# Patient Record
Sex: Male | Born: 1955
Health system: Southern US, Community
[De-identification: ages and names within clinical notes are randomized; demographics above are authoritative.]

## PROBLEM LIST (undated history)

## (undated) DIAGNOSIS — I10 Essential (primary) hypertension: Secondary | ICD-10-CM

## (undated) DIAGNOSIS — M1990 Unspecified osteoarthritis, unspecified site: Secondary | ICD-10-CM

## (undated) DIAGNOSIS — R579 Shock, unspecified: Secondary | ICD-10-CM

## (undated) DIAGNOSIS — I2510 Atherosclerotic heart disease of native coronary artery without angina pectoris: Secondary | ICD-10-CM

## (undated) DIAGNOSIS — G9340 Encephalopathy, unspecified: Secondary | ICD-10-CM

## (undated) DIAGNOSIS — J9602 Acute respiratory failure with hypercapnia: Secondary | ICD-10-CM

## (undated) DIAGNOSIS — F32A Depression, unspecified: Secondary | ICD-10-CM

## (undated) DIAGNOSIS — I428 Other cardiomyopathies: Secondary | ICD-10-CM

## (undated) DIAGNOSIS — F329 Major depressive disorder, single episode, unspecified: Secondary | ICD-10-CM

## (undated) DIAGNOSIS — E119 Type 2 diabetes mellitus without complications: Secondary | ICD-10-CM

## (undated) HISTORY — DX: Acute respiratory failure with hypercapnia: J96.02

## (undated) HISTORY — DX: Depression, unspecified: F32.A

## (undated) HISTORY — PX: BACK SURGERY: SHX140

## (undated) HISTORY — DX: Unspecified osteoarthritis, unspecified site: M19.90

## (undated) HISTORY — DX: Shock, unspecified: R57.9

## (undated) HISTORY — DX: Encephalopathy, unspecified: G93.40

## (undated) HISTORY — DX: Major depressive disorder, single episode, unspecified: F32.9

---

## 2005-03-20 ENCOUNTER — Emergency Department: Payer: Self-pay | Admitting: General Practice

## 2005-03-20 ENCOUNTER — Other Ambulatory Visit: Payer: Self-pay

## 2005-11-06 ENCOUNTER — Ambulatory Visit: Payer: Self-pay | Admitting: Internal Medicine

## 2005-12-06 ENCOUNTER — Ambulatory Visit: Payer: Self-pay | Admitting: Internal Medicine

## 2006-06-06 ENCOUNTER — Emergency Department: Payer: Self-pay | Admitting: Emergency Medicine

## 2006-06-08 ENCOUNTER — Emergency Department: Payer: Self-pay

## 2006-07-22 ENCOUNTER — Ambulatory Visit: Payer: Self-pay | Admitting: Cardiovascular Disease

## 2007-03-03 DIAGNOSIS — IMO0002 Reserved for concepts with insufficient information to code with codable children: Secondary | ICD-10-CM | POA: Insufficient documentation

## 2007-03-03 DIAGNOSIS — I10 Essential (primary) hypertension: Secondary | ICD-10-CM | POA: Insufficient documentation

## 2007-03-03 DIAGNOSIS — E7800 Pure hypercholesterolemia, unspecified: Secondary | ICD-10-CM | POA: Insufficient documentation

## 2007-03-03 DIAGNOSIS — E1165 Type 2 diabetes mellitus with hyperglycemia: Secondary | ICD-10-CM | POA: Insufficient documentation

## 2007-03-03 DIAGNOSIS — G894 Chronic pain syndrome: Secondary | ICD-10-CM | POA: Insufficient documentation

## 2007-05-09 ENCOUNTER — Other Ambulatory Visit: Payer: Self-pay

## 2007-05-09 ENCOUNTER — Emergency Department: Payer: Self-pay | Admitting: Emergency Medicine

## 2007-06-04 DIAGNOSIS — I4949 Other premature depolarization: Secondary | ICD-10-CM | POA: Insufficient documentation

## 2007-09-20 ENCOUNTER — Emergency Department: Payer: Self-pay | Admitting: Emergency Medicine

## 2008-03-27 DIAGNOSIS — Z8659 Personal history of other mental and behavioral disorders: Secondary | ICD-10-CM | POA: Insufficient documentation

## 2008-11-26 ENCOUNTER — Ambulatory Visit: Payer: Self-pay | Admitting: Family Medicine

## 2008-11-29 ENCOUNTER — Other Ambulatory Visit: Payer: Self-pay

## 2008-12-17 ENCOUNTER — Ambulatory Visit: Payer: Self-pay | Admitting: Family Medicine

## 2009-01-28 ENCOUNTER — Ambulatory Visit: Payer: Self-pay | Admitting: Family Medicine

## 2009-02-06 ENCOUNTER — Ambulatory Visit: Payer: Self-pay | Admitting: Family Medicine

## 2009-10-18 DIAGNOSIS — R61 Generalized hyperhidrosis: Secondary | ICD-10-CM | POA: Insufficient documentation

## 2010-10-02 ENCOUNTER — Ambulatory Visit: Payer: Self-pay | Admitting: Urology

## 2010-10-06 ENCOUNTER — Ambulatory Visit: Payer: Self-pay | Admitting: Urology

## 2011-06-04 ENCOUNTER — Ambulatory Visit: Payer: Self-pay | Admitting: Family Medicine

## 2011-09-14 ENCOUNTER — Ambulatory Visit: Payer: Self-pay | Admitting: Family Medicine

## 2011-09-23 ENCOUNTER — Other Ambulatory Visit: Payer: Self-pay | Admitting: Physical Medicine and Rehabilitation

## 2011-09-23 DIAGNOSIS — M542 Cervicalgia: Secondary | ICD-10-CM

## 2011-09-29 ENCOUNTER — Ambulatory Visit
Admission: RE | Admit: 2011-09-29 | Discharge: 2011-09-29 | Disposition: A | Discharge status: Home / Self Care | Payer: Medicare Other | Source: Ambulatory Visit | Attending: Physical Medicine and Rehabilitation | Admitting: Physical Medicine and Rehabilitation

## 2011-09-29 DIAGNOSIS — M542 Cervicalgia: Secondary | ICD-10-CM

## 2012-05-02 ENCOUNTER — Other Ambulatory Visit: Payer: Self-pay | Admitting: Family Medicine

## 2012-05-12 ENCOUNTER — Emergency Department (HOSPITAL_COMMUNITY): Payer: Medicare Other

## 2012-05-12 ENCOUNTER — Emergency Department (HOSPITAL_COMMUNITY)
Admission: EM | Admit: 2012-05-12 | Discharge: 2012-05-12 | Disposition: A | Discharge status: Home / Self Care | Payer: Medicare Other | Attending: Emergency Medicine | Admitting: Emergency Medicine

## 2012-05-12 ENCOUNTER — Encounter (HOSPITAL_COMMUNITY): Payer: Self-pay

## 2012-05-12 DIAGNOSIS — J3489 Other specified disorders of nose and nasal sinuses: Secondary | ICD-10-CM | POA: Insufficient documentation

## 2012-05-12 DIAGNOSIS — I10 Essential (primary) hypertension: Secondary | ICD-10-CM | POA: Insufficient documentation

## 2012-05-12 DIAGNOSIS — R062 Wheezing: Secondary | ICD-10-CM | POA: Insufficient documentation

## 2012-05-12 DIAGNOSIS — R509 Fever, unspecified: Secondary | ICD-10-CM | POA: Insufficient documentation

## 2012-05-12 DIAGNOSIS — Z79899 Other long term (current) drug therapy: Secondary | ICD-10-CM | POA: Insufficient documentation

## 2012-05-12 DIAGNOSIS — J9801 Acute bronchospasm: Secondary | ICD-10-CM | POA: Insufficient documentation

## 2012-05-12 DIAGNOSIS — E119 Type 2 diabetes mellitus without complications: Secondary | ICD-10-CM | POA: Insufficient documentation

## 2012-05-12 DIAGNOSIS — J988 Other specified respiratory disorders: Secondary | ICD-10-CM

## 2012-05-12 HISTORY — DX: Essential (primary) hypertension: I10

## 2012-05-12 HISTORY — DX: Type 2 diabetes mellitus without complications: E11.9

## 2012-05-12 MED ORDER — OSELTAMIVIR PHOSPHATE 75 MG PO CAPS: 75.0000 mg | ORAL_CAPSULE | Freq: Two times a day (BID) | ORAL | Status: DC

## 2012-05-12 MED ORDER — ALBUTEROL SULFATE (5 MG/ML) 0.5% IN NEBU
5.0000 mg | INHALATION_SOLUTION | Freq: Once | RESPIRATORY_TRACT | Status: AC
  Administered 2012-05-12: 5 mg via RESPIRATORY_TRACT
  Filled 2012-05-12: qty 1

## 2012-05-12 MED ORDER — AZITHROMYCIN 250 MG PO TABS: ORAL_TABLET | ORAL | Status: DC

## 2012-05-12 MED ORDER — PREDNISONE 50 MG PO TABS
60.0000 mg | ORAL_TABLET | Freq: Once | ORAL | Status: AC
  Administered 2012-05-12: 60 mg via ORAL
  Filled 2012-05-12: qty 1

## 2012-05-12 MED ORDER — SODIUM CHLORIDE 0.9 % IN NEBU
INHALATION_SOLUTION | RESPIRATORY_TRACT | Status: AC
  Administered 2012-05-12: 3 mL
  Filled 2012-05-12: qty 3

## 2012-05-12 MED ORDER — PREDNISONE 20 MG PO TABS: 60.0000 mg | ORAL_TABLET | Freq: Every day | ORAL | Status: DC

## 2012-05-12 MED ORDER — OSELTAMIVIR PHOSPHATE 75 MG PO CAPS
75.0000 mg | ORAL_CAPSULE | Freq: Once | ORAL | Status: AC
  Administered 2012-05-12: 75 mg via ORAL
  Filled 2012-05-12: qty 1

## 2012-05-12 MED ORDER — ALBUTEROL SULFATE HFA 108 (90 BASE) MCG/ACT IN AERS
2.0000 | INHALATION_SPRAY | RESPIRATORY_TRACT | Status: DC | PRN
  Administered 2012-05-12: 2 via RESPIRATORY_TRACT
  Filled 2012-05-12: qty 6.7

## 2012-05-12 MED ORDER — AZITHROMYCIN 250 MG PO TABS
500.0000 mg | ORAL_TABLET | Freq: Once | ORAL | Status: AC
  Administered 2012-05-12: 500 mg via ORAL
  Filled 2012-05-12: qty 2

## 2012-05-12 NOTE — ED Notes (Signed)
Pt c/o fever, cough, chills, and dizziness for 3 days.

## 2012-05-12 NOTE — ED Notes (Signed)
Discharge instructions reviewed with pt, questions answered. Pt verbalized understanding.  

## 2012-05-12 NOTE — ED Provider Notes (Signed)
History     CSN: [id number]  Arrival date & time 05/12/12  0249   First MD Initiated Contact with Patient 05/12/12 0301      Chief Complaint  Patient presents with  . Nasal Congestion  . Cough  . Fever    (Consider location/radiation/quality/duration/timing/severity/associated sxs/prior treatment) HPI History provided by patient. Cough with chills and fever x2 days. Patient became worried when he woke up feeling short of breath, wheezing with bodyaches and "drenched bed from sweating". No known sick contacts. Symptoms moderate in severity. No measured fevers. Has not had flu shot this season. No sore throat or difficulty swallowing. No history of tobacco use, asthma or wheezing in the past. Patient is diabetic and followed by [facility] family practice. He has not taken any for symptoms. No known aggravating or alleviating factors. Past Medical History  Diagnosis Date  . Hypertension   . Diabetes mellitus without complication     Past Surgical History  Procedure Date  . Back surgery     No family history on file.  History  Substance Use Topics  . Smoking status: Never Smoker   . Smokeless tobacco: Not on file  . Alcohol Use: No      Review of Systems  Constitutional: Positive for fever and chills.  HENT: Negative for neck pain and neck stiffness.   Eyes: Negative for pain.  Respiratory: Positive for cough, shortness of breath and wheezing.   Cardiovascular: Negative for chest pain.  Gastrointestinal: Negative for vomiting and abdominal pain.  Genitourinary: Negative for dysuria.  Musculoskeletal: Negative for back pain.  Skin: Negative for rash.  Neurological: Negative for syncope.  All other systems reviewed and are negative.    Allergies  Review of patient's allergies indicates no known allergies.  Home Medications   Current Outpatient Rx  Name  Route  Sig  Dispense  Refill  . AZITHROMYCIN 250 MG PO TABS      1 tablet by mouth daily   4 each   0    . METFORMIN HCL 1000 MG PO TABS      TAKE ONE TABLET BY MOUTH TWICE DAILY   20 tablet   0     Needs office visit   . OSELTAMIVIR PHOSPHATE 75 MG PO CAPS   Oral   Take 1 capsule (75 mg total) by mouth every 12 (twelve) hours.   10 capsule   0   . PREDNISONE 20 MG PO TABS   Oral   Take 3 tablets (60 mg total) by mouth daily.   15 tablet   0     BP 147/91  Pulse 95  Temp 98.3 F (36.8 C) (Oral)  Resp 18  Ht 6' (1.829 m)  Wt 310 lb (140.615 kg)  BMI 42.04 kg/m2  SpO2 96%  Physical Exam  Constitutional: He is oriented to person, place, and time. He appears well-developed and well-nourished.  HENT:  Head: Normocephalic and atraumatic.  Mouth/Throat: Oropharynx is clear and moist.  Eyes: EOM are normal. Pupils are equal, round, and reactive to light.  Neck: Neck supple.  Cardiovascular: Normal rate, regular rhythm and intact distal pulses.   Pulmonary/Chest: Effort normal.       Mildly decreased breath sounds with bilateral expiratory wheezes. No tachypnea or respiratory distress.  Abdominal: Soft. Bowel sounds are normal. He exhibits no distension. There is no tenderness.  Musculoskeletal: Normal range of motion. He exhibits no edema.  Lymphadenopathy:    He has no cervical adenopathy.  Neurological: He  is alert and oriented to person, place, and time.  Skin: Skin is warm and dry.    ED Course  Procedures (including critical care time)  Labs Reviewed - No data to display Dg Chest 2 View  05/12/2012  *RADIOLOGY REPORT*  Clinical Data: Congestion, cough, fever and right-sided chest pain.  CHEST - 2 VIEW  Comparison: None.  Findings: The lungs are well-aerated.  Minimal bibasilar airspace opacities may reflect atelectasis or possibly mild pneumonia. There is no evidence of pleural effusion or pneumothorax.  The heart is normal in size; the mediastinal contour is within normal limits.  No acute osseous abnormalities are seen.  IMPRESSION: Minimal bibasilar airspace  opacities may reflect atelectasis or possibly mild pneumonia.   Original Report Authenticated By: Tonia Ghent, M.D.      1. Wheezing-associated respiratory infection (WARI)    Albuterol and steroids provided. Chest x-ray obtained and reviewed as above. Recheck after breathing treatments still has wheezes, repeat albuterol provided with inhaler.  5:34 AM recheck feeling much better with improved lung sounds in stable for discharge home. Plan close followup with primary care physician and be evaluated sooner for any worsening condition. MDM   Fever cough and respiratory infection. Patient has flulike symptoms and chest x-ray concerning for possible early pneumonia. Room air pulse ox is 96% and adequate. Pulmonary exam improved with breathing treatments. Patient stable for outpatient follow-up. Vital signs and nursing notes reviewed and considered. Prescriptions provided with strict return precautions verbalized as understood.        Sunnie Nielsen, MD 05/12/12 [id number]

## 2012-08-25 ENCOUNTER — Other Ambulatory Visit: Payer: Self-pay | Admitting: Family Medicine

## 2012-09-01 ENCOUNTER — Ambulatory Visit: Payer: Self-pay | Admitting: Family Medicine

## 2013-03-09 ENCOUNTER — Ambulatory Visit: Payer: Self-pay | Admitting: Family Medicine

## 2013-03-27 ENCOUNTER — Ambulatory Visit: Payer: Self-pay | Admitting: Family Medicine

## 2013-03-27 DIAGNOSIS — R160 Hepatomegaly, not elsewhere classified: Secondary | ICD-10-CM | POA: Insufficient documentation

## 2013-04-13 ENCOUNTER — Ambulatory Visit: Payer: Self-pay | Admitting: Gastroenterology

## 2013-12-06 IMAGING — CR DG HEEL 2V*L*
1 series · 2 of 2 positions shown · non-contrast
Comparison: none

REASON FOR EXAM: left foot pain
COMMENTS:

PROCEDURE:     KDR - KDXR CALCANEOUS-HEEL LEFT  - June 04, 2011 [DATE]
RESULT:     AP and lateral views of the left calcaneus are submitted. I do
not see evidence of acute fracture nor dislocation. The overlying soft
tissues are grossly normal. No significant spurring is demonstrated.

[Series 1: lat · 0.17mm/px · 2 of 2 slices shown]
[im 1/2]
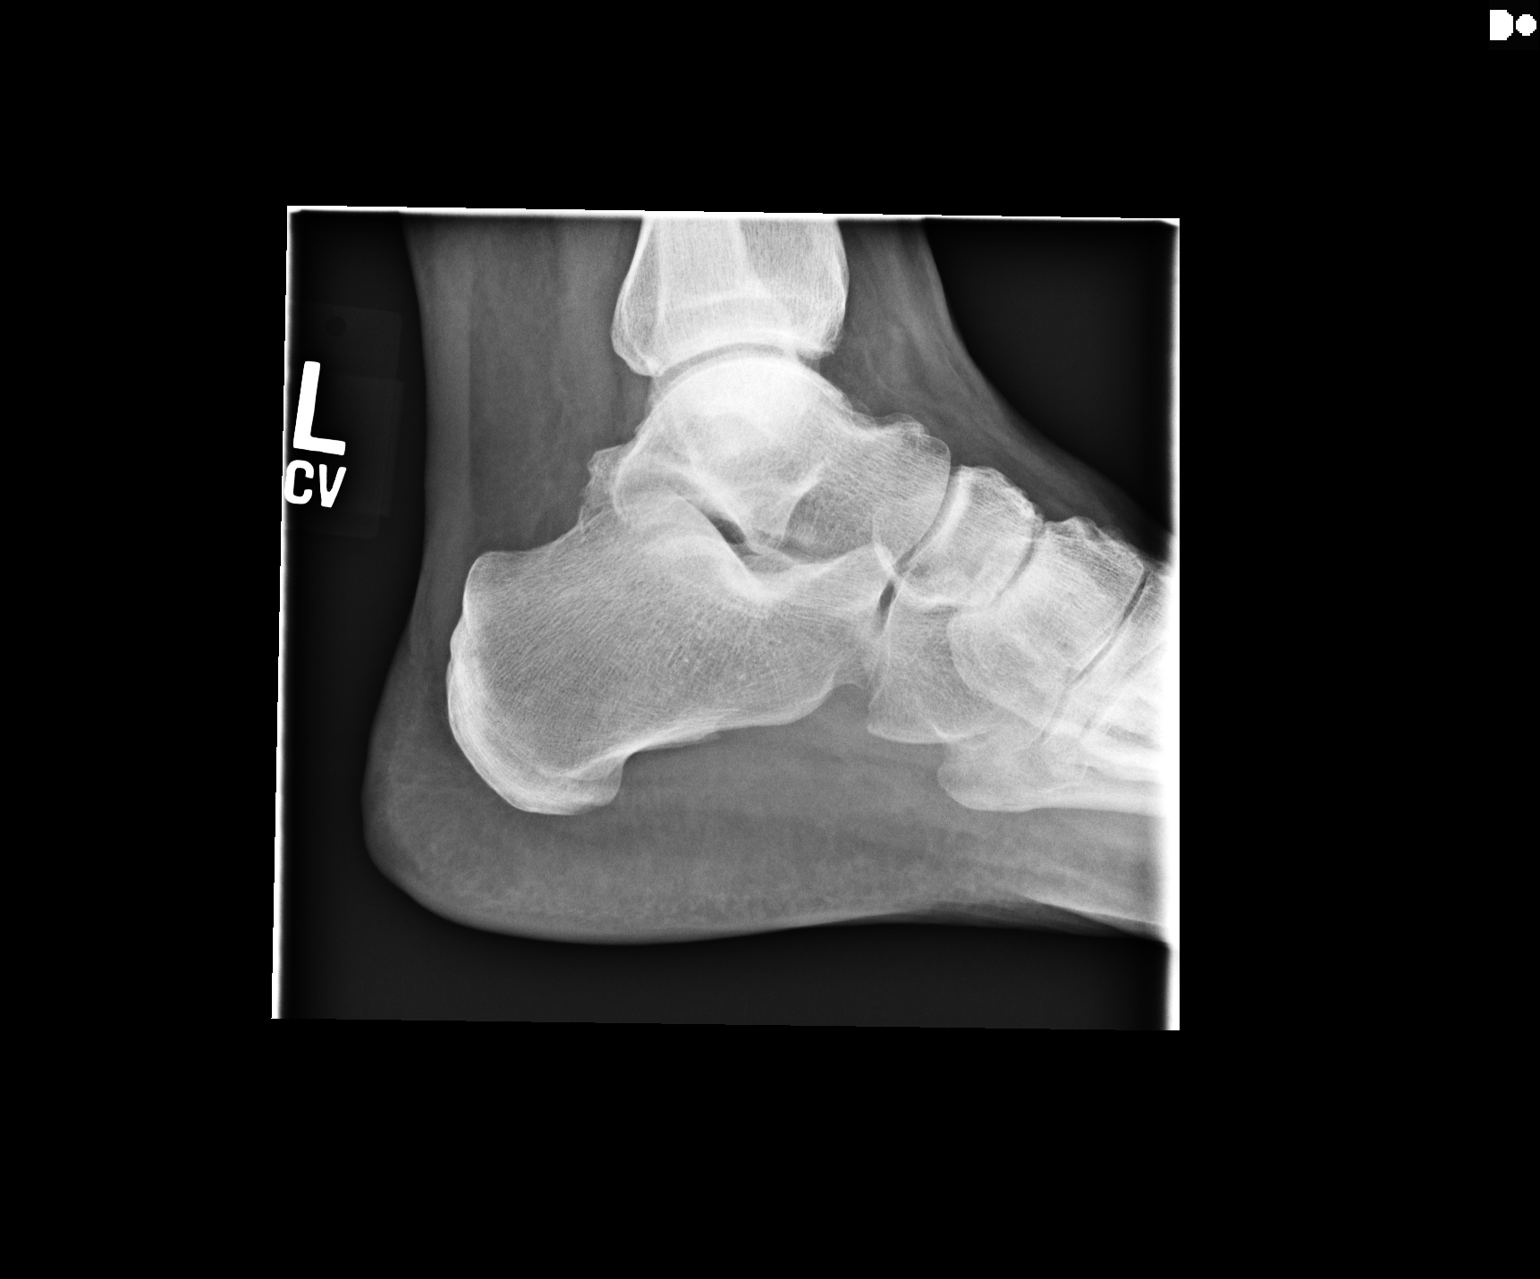
[im 2/2]
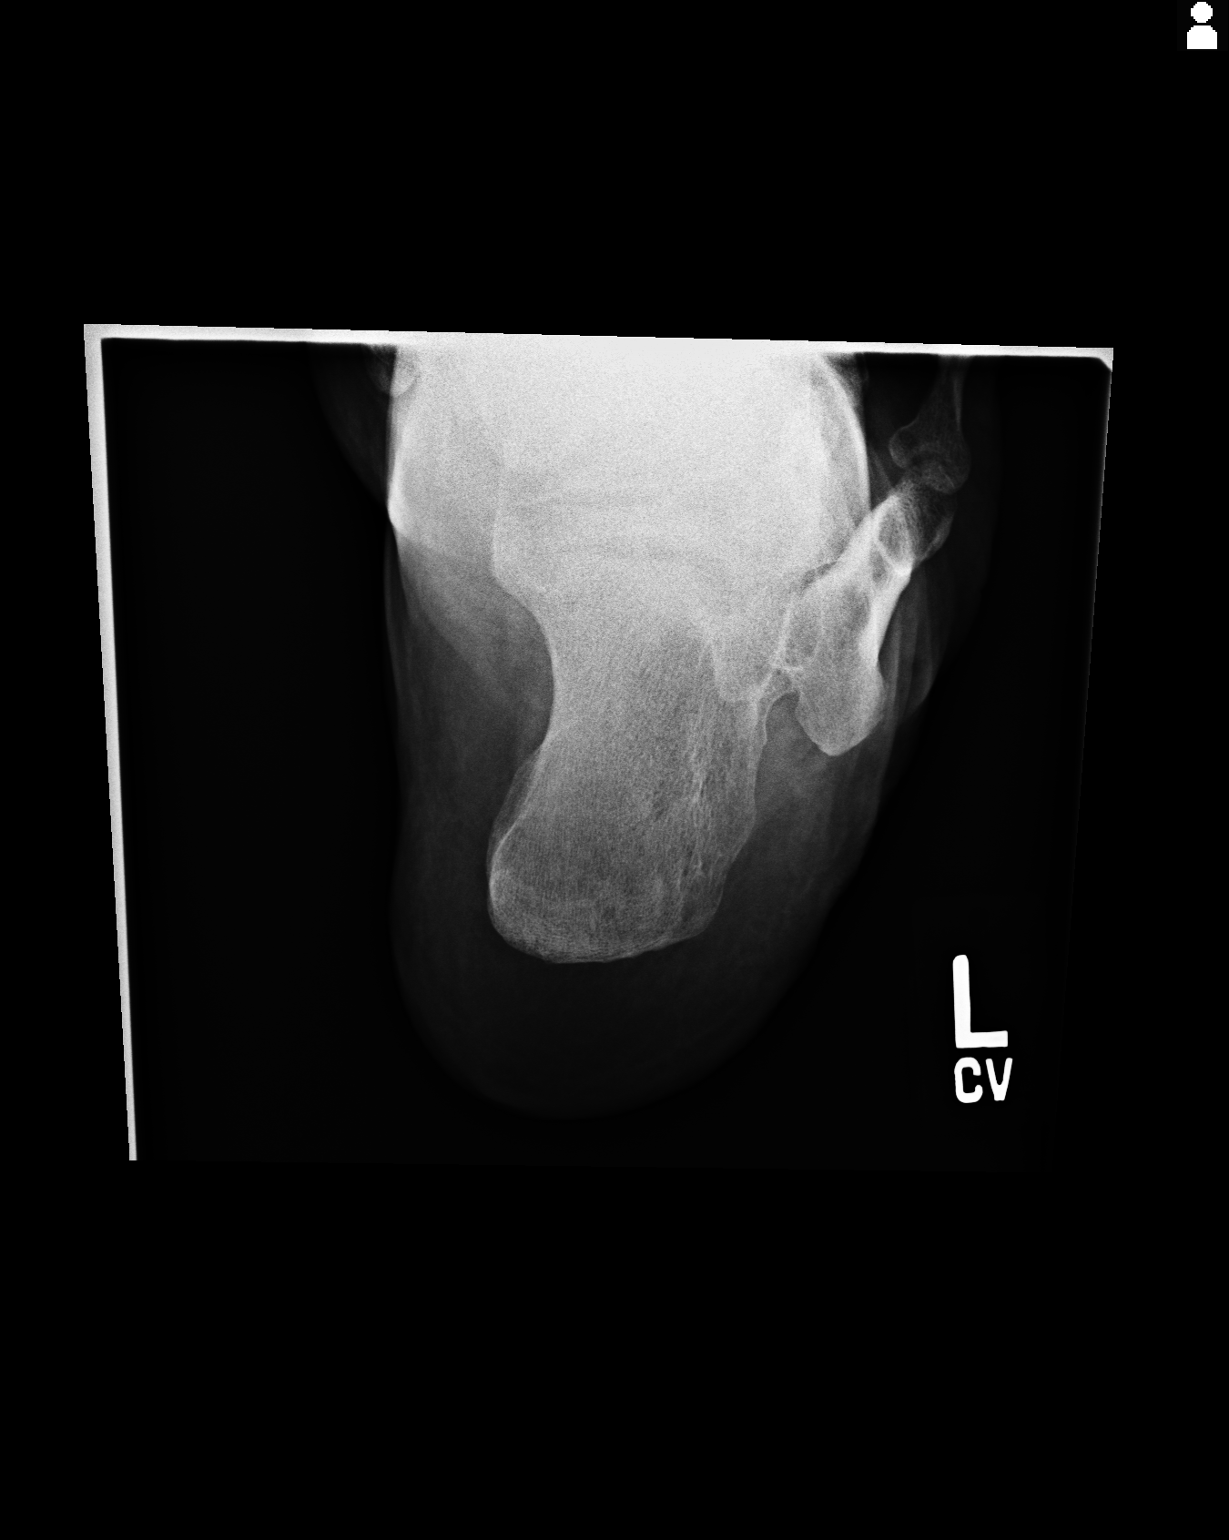

[2 of 2 positions shown; findings below may reference images not displayed]

IMPRESSION: I do not see acute abnormality of the calcaneus. If the
patient's symptoms persist and remain unexplained, followup nuclear bone
scanning or MRI may be useful.

## 2014-07-09 DIAGNOSIS — E291 Testicular hypofunction: Secondary | ICD-10-CM | POA: Diagnosis not present

## 2014-07-09 DIAGNOSIS — E1165 Type 2 diabetes mellitus with hyperglycemia: Secondary | ICD-10-CM | POA: Diagnosis not present

## 2014-07-09 DIAGNOSIS — I10 Essential (primary) hypertension: Secondary | ICD-10-CM | POA: Diagnosis not present

## 2014-08-01 DIAGNOSIS — I10 Essential (primary) hypertension: Secondary | ICD-10-CM | POA: Diagnosis not present

## 2014-08-01 DIAGNOSIS — E1165 Type 2 diabetes mellitus with hyperglycemia: Secondary | ICD-10-CM | POA: Diagnosis not present

## 2014-08-01 LAB — BASIC METABOLIC PANEL
BUN: 9 mg/dL (ref 4–21)
CREATININE: 1.1 mg/dL (ref <=1.3)
GLUCOSE: 222 mg/dL
POTASSIUM: 4.4 mmol/L (ref 3.4–5.3)
SODIUM: 136 mmol/L — AB (ref 137–147)

## 2014-10-04 DIAGNOSIS — I10 Essential (primary) hypertension: Secondary | ICD-10-CM | POA: Diagnosis not present

## 2014-10-04 DIAGNOSIS — E1165 Type 2 diabetes mellitus with hyperglycemia: Secondary | ICD-10-CM | POA: Diagnosis not present

## 2014-10-04 LAB — HEMOGLOBIN A1C: HEMOGLOBIN A1C: 8.7 % — AB (ref 4.0–6.0)

## 2014-11-15 ENCOUNTER — Encounter: Payer: Self-pay | Admitting: Family Medicine

## 2014-11-15 ENCOUNTER — Ambulatory Visit (INDEPENDENT_AMBULATORY_CARE_PROVIDER_SITE_OTHER): Payer: Medicare Other | Admitting: Family Medicine

## 2014-11-15 VITALS — BP 120/78 | HR 89 | Temp 98.7°F | Resp 16 | Ht 71.5 in | Wt 315.0 lb

## 2014-11-15 DIAGNOSIS — M546 Pain in thoracic spine: Secondary | ICD-10-CM | POA: Insufficient documentation

## 2014-11-15 DIAGNOSIS — R209 Unspecified disturbances of skin sensation: Secondary | ICD-10-CM | POA: Insufficient documentation

## 2014-11-15 DIAGNOSIS — L821 Other seborrheic keratosis: Secondary | ICD-10-CM | POA: Insufficient documentation

## 2014-11-15 DIAGNOSIS — E291 Testicular hypofunction: Secondary | ICD-10-CM | POA: Insufficient documentation

## 2014-11-15 DIAGNOSIS — R351 Nocturia: Secondary | ICD-10-CM | POA: Insufficient documentation

## 2014-11-15 DIAGNOSIS — M545 Low back pain, unspecified: Secondary | ICD-10-CM | POA: Insufficient documentation

## 2014-11-15 DIAGNOSIS — S90122A Contusion of left lesser toe(s) without damage to nail, initial encounter: Secondary | ICD-10-CM

## 2014-11-15 DIAGNOSIS — E349 Endocrine disorder, unspecified: Secondary | ICD-10-CM | POA: Insufficient documentation

## 2014-11-15 DIAGNOSIS — M542 Cervicalgia: Secondary | ICD-10-CM | POA: Insufficient documentation

## 2014-11-15 DIAGNOSIS — G8929 Other chronic pain: Secondary | ICD-10-CM | POA: Insufficient documentation

## 2014-11-15 DIAGNOSIS — M961 Postlaminectomy syndrome, not elsewhere classified: Secondary | ICD-10-CM | POA: Insufficient documentation

## 2014-11-15 DIAGNOSIS — R0609 Other forms of dyspnea: Secondary | ICD-10-CM | POA: Insufficient documentation

## 2014-11-15 NOTE — Progress Notes (Signed)
   Patient: Taylor [name] Male    DOB: 12-17-1955   59 y.o.   MRN: [id number] Visit Date: 11/15/2014  Today's Provider: Mila Merry, MD   No chief complaint on file.  Subjective:    HPI Patient states his daughter was dancing around and stepped down hard on the outside of his left foot several days ago. He states that a few days later tops of his 2nd and third toes turned blue and discolored. Is a little sore on the side of his foot at site of original injury, but no pain at site of toe discoloration.    Previous Medications   AZITHROMYCIN (ZITHROMAX) 250 MG TABLET    1 tablet by mouth daily   METFORMIN (GLUCOPHAGE) 1000 MG TABLET    TAKE ONE TABLET BY MOUTH TWICE DAILY   OSELTAMIVIR (TAMIFLU) 75 MG CAPSULE    Take 1 capsule (75 mg total) by mouth every 12 (twelve) hours.   PREDNISONE (DELTASONE) 20 MG TABLET    Take 3 tablets (60 mg total) by mouth daily.    Review of Systems  Constitutional: Positive for fatigue. Negative for fever.  Cardiovascular: Negative for chest pain, palpitations and leg swelling.  Gastrointestinal: Negative for abdominal pain.  Neurological: Negative for headaches.    History  Substance Use Topics  . Smoking status: Never Smoker   . Smokeless tobacco: Not on file  . Alcohol Use: No   Objective:   There were no vitals taken for this visit.  Physical Exam  Mild tenderness right lateral foot, no swelling. No discoloration at site. Some purplish discoloration with no tenderness of other abnormalities of dorsal aspect of right 2nd and thirds toes.      Assessment & Plan:     1. Contusion, toes, left, initial encounter Counseled that discoloration is due to venous drainage from site of injury and should heal completely over the next few weeks. Call otherwise.     Follow up: No Follow-up on file.

## 2014-12-05 ENCOUNTER — Other Ambulatory Visit: Payer: Self-pay | Admitting: Family Medicine

## 2014-12-12 ENCOUNTER — Telehealth: Payer: Self-pay | Admitting: Family Medicine

## 2014-12-12 DIAGNOSIS — E1165 Type 2 diabetes mellitus with hyperglycemia: Secondary | ICD-10-CM

## 2014-12-12 DIAGNOSIS — IMO0002 Reserved for concepts with insufficient information to code with codable children: Secondary | ICD-10-CM

## 2014-12-12 MED ORDER — CANAGLIFLOZIN 300 MG PO TABS: ORAL_TABLET | ORAL | Status: DC

## 2014-12-12 NOTE — Telephone Encounter (Signed)
Pt contacted office for refill request on the following medications: canagliflozin (INVOKANA) 300 MG TABS tablet  Pt stated that he comes to the office to and picks this up. I asked if it was samples and pt stated he guesses it is. Pt stated he took the last dose yesterday. Thanks TNP

## 2014-12-12 NOTE — Telephone Encounter (Signed)
Samples left up front for pick up. Patient advised.  

## 2014-12-12 NOTE — Telephone Encounter (Signed)
He can have 4 bottles of samples of Invokana 300. Thanks.

## 2014-12-31 ENCOUNTER — Other Ambulatory Visit: Payer: Self-pay | Admitting: Family Medicine

## 2014-12-31 NOTE — Telephone Encounter (Signed)
LMTCB

## 2014-12-31 NOTE — Telephone Encounter (Signed)
Pt contacted office for refill request on the following medications: canagliflozin (INVOKANA) 300 MG TABS tablet Pt would like to pick up samples. Pt also stated he had a message on his machine on 12/25/14 from our office but couldn't understand the message. I didn't find anything in pt's charts. Thanks TNP

## 2014-12-31 NOTE — Telephone Encounter (Signed)
He can have six samples bottles of Invokana 300. Thanks.

## 2014-12-31 NOTE — Telephone Encounter (Signed)
Ok to give Invokana samples?

## 2015-01-01 NOTE — Telephone Encounter (Signed)
Pt informed

## 2015-01-07 ENCOUNTER — Other Ambulatory Visit: Payer: Self-pay | Admitting: Family Medicine

## 2015-01-07 MED ORDER — LISINOPRIL 10 MG PO TABS: 10.0000 mg | ORAL_TABLET | Freq: Every day | ORAL | Status: DC

## 2015-01-07 NOTE — Telephone Encounter (Signed)
Refill request for lisinopril 10 mg Last filled by MD on- 11/05/2014 #30 x1 Last Appt: 11/15/2014 Next Appt: 01/15/2015 Please advise refill?

## 2015-01-07 NOTE — Telephone Encounter (Signed)
lisinopril (PRINIVIL,ZESTRIL) 10 MG tablet Taking 11/05/14 -- they needs new RX order.   Thanks Barth Kirks

## 2015-01-15 ENCOUNTER — Encounter: Payer: Self-pay | Admitting: Family Medicine

## 2015-01-24 ENCOUNTER — Ambulatory Visit (INDEPENDENT_AMBULATORY_CARE_PROVIDER_SITE_OTHER): Payer: Medicare Other | Admitting: Family Medicine

## 2015-01-24 ENCOUNTER — Encounter: Payer: Self-pay | Admitting: Family Medicine

## 2015-01-24 VITALS — BP 118/80 | HR 65 | Temp 98.7°F | Resp 16 | Ht 71.5 in | Wt 317.0 lb

## 2015-01-24 DIAGNOSIS — Z0000 Encounter for general adult medical examination without abnormal findings: Secondary | ICD-10-CM

## 2015-01-24 DIAGNOSIS — I10 Essential (primary) hypertension: Secondary | ICD-10-CM

## 2015-01-24 DIAGNOSIS — E291 Testicular hypofunction: Secondary | ICD-10-CM | POA: Diagnosis not present

## 2015-01-24 DIAGNOSIS — E1165 Type 2 diabetes mellitus with hyperglycemia: Secondary | ICD-10-CM

## 2015-01-24 DIAGNOSIS — E7800 Pure hypercholesterolemia, unspecified: Secondary | ICD-10-CM

## 2015-01-24 DIAGNOSIS — IMO0002 Reserved for concepts with insufficient information to code with codable children: Secondary | ICD-10-CM

## 2015-01-24 NOTE — Progress Notes (Signed)
Patient: Taylor [name], Male    DOB: 09/14/1955, 59 y.o.   MRN: [id number] Visit Date: 01/24/2015  Today's Provider: Mila Merry, MD   Chief Complaint  Patient presents with  . Medicare Wellness  . Diabetes  . Hypertension  . Hyperlipidemia   Subjective:    Annual wellness visit Taylor [name] is a 59 y.o. male. He feels fairly well. He reports exercising yes . He reports he is sleeping poorly.  -----------------------------------------------------------  Follow-up for chronic back pain from 10/04/2014; started 6 day taper of prednisone 10 mg. Follow-up for chronic pain syndrome; no changes were made.   Diabetes Mellitus Type II, Follow-up:   Lab Results  Component Value Date   HGBA1C 8.7* 10/04/2014   Last seen for diabetes 4 months ago.  Management changes included none. He reports good compliance with treatment. He is not having side effects. none Current symptoms include none and have been unchanged. Home blood sugar records: fasting range: 100-200  Episodes of hypoglycemia? no    Most Recent Eye Exam: Within the last year.  Weight trend: stable Prior visit with dietician: no Current diet: well balanced Current exercise: none  ------------------------------------------------------------------------   Hypertension, follow-up:  BP Readings from Last 3 Encounters:  01/24/15 118/80  11/15/14 120/78  05/12/12 147/91    He was last seen for hypertension 4 months ago.  BP at that visit was 140/96. Management changes since that visit include none .He reports good compliance with treatment. He is not having side effects. none  He is exercising. He is adherent to low salt diet.   Outside blood pressures are none. He is experiencing none.  Patient denies none.   Cardiovascular risk factors include hypertension.  Use of agents associated with hypertension: NSAIDS.    ------------------------------------------------------------------------    Lipid/Cholesterol, Follow-up:   Last seen for this 9 months ago.  Management changes since that visit include none.  Last Lipid Panel: No results found for: CHOL, TRIG, HDL, CHOLHDL, VLDL, LDLCALC, LDLDIRECT  He reports good compliance with treatment. He is not having side effects. none  Wt Readings from Last 3 Encounters:  01/24/15 317 lb (143.79 kg)  11/15/14 315 lb (142.883 kg)  05/12/12 310 lb (140.615 kg)    ------------------------------------------------------------------------    Review of Systems  Constitutional: Positive for diaphoresis and fatigue.  Eyes: Negative.   Respiratory: Negative.   Gastrointestinal: Positive for constipation and abdominal distention.  Endocrine: Positive for heat intolerance and polydipsia.  Genitourinary: Negative.   Musculoskeletal: Positive for myalgias and arthralgias.  Skin: Negative.   Allergic/Immunologic: Negative.   Neurological: Positive for weakness and numbness. Negative for facial asymmetry, light-headedness and headaches.  Hematological: Negative.   Psychiatric/Behavioral: Positive for sleep disturbance and decreased concentration.    Social History   Social History  . Marital Status: Single    Spouse Name: N/A  . Number of Children: N/A  . Years of Education: N/A   Occupational History  . Not on file.   Social History Main Topics  . Smoking status: Never Smoker   . Smokeless tobacco: Not on file  . Alcohol Use: No  . Drug Use: Not on file  . Sexual Activity: Not on file   Other Topics Concern  . Not on file   Social History Narrative    Patient Active Problem List   Diagnosis Date Noted  . Back ache 11/15/2014  . Chronic low back pain 11/15/2014  . Dyspnea on exertion 11/15/2014  .  Erectile dysfunction 11/15/2014  . Hypogonadism, male 11/15/2014  . Cervical pain 11/15/2014  . Nocturia 11/15/2014  . Disturbance of  skin sensation 11/15/2014  . RUQ pain 11/15/2014  . Basal cell papilloma 11/15/2014  . Thoracic back pain 11/15/2014  . Hepatomegaly 03/27/2013  . Generalized hyperhidrosis 10/18/2009  . Depressive disorder 03/27/2008  . Premature beats 06/04/2007  . Chronic pain syndrome 03/03/2007  . Diabetes mellitus type 2, uncontrolled 03/03/2007  . Hypercholesteremia 03/03/2007  . Hypertension, essential, benign 03/03/2007  . Obesity 03/03/2007    Past Surgical History  Procedure Laterality Date  . Back surgery      His family history includes Diabetes in his mother and sister.    Previous Medications   ASPIRIN 81 MG TABLET    Take by mouth.   CANAGLIFLOZIN (INVOKANA) 300 MG TABS TABLET    Take 1 tablet daily   GLIPIZIDE (GLUCOTROL XL) 10 MG 24 HR TABLET    TAKE ONE TABLET BY MOUTH ONCE DAILY   HYDROMORPHONE (DILAUDID) 4 MG TABLET    Take 4 mg by mouth every 6 (six) hours as needed.    IBUPROFEN-FAMOTIDINE 800-26.6 MG TABS    Take 1 tablet by mouth 3 (three) times daily as needed.   LISINOPRIL (PRINIVIL,ZESTRIL) 10 MG TABLET    Take 1 tablet (10 mg total) by mouth daily.   METFORMIN (GLUCOPHAGE) 1000 MG TABLET    TAKE ONE TABLET BY MOUTH TWICE DAILY   OXYCODONE HCL    Take 40 mg by mouth every 4 (four) hours as needed.    PREGABALIN (LYRICA) 75 MG CAPSULE    Take by mouth.   SIMVASTATIN (ZOCOR) 40 MG TABLET    Take by mouth.   TESTOSTERONE IM       ZOLPIDEM (AMBIEN CR) 12.5 MG CR TABLET    AMBIEN CR, 12.5MG  (Oral Tablet Extended Release)  1 PO QHS for 0 days  Quantity: 30.00;  Refills: 0   Ordered :15-Jul-2010  Nilda Simmer MD;  Started 03-Mar-2007 Active    Patient Care Team: Malva Limes, MD as PCP - General (Family Medicine) Raj Janus, MD as Consulting Physician (Endocrinology)     Objective:   Vitals: BP 118/80 mmHg  Pulse 65  Temp(Src) 98.7 F (37.1 C) (Oral)  Resp 16  Ht 5' 11.5" (1.816 m)  Wt 317 lb (143.79 kg)  BMI 43.60 kg/m2  SpO2 97%  Physical  Exam  General Appearance:    Alert, cooperative, no distress, appears stated age, obese  Head:    Normocephalic, without obvious abnormality, atraumatic  Eyes:    PERRL, conjunctiva/corneas clear, EOM's intact, fundi    benign, both eyes       Ears:    Normal TM's and external ear canals, both ears  Nose:   Nares normal, septum midline, mucosa normal, no drainage   or sinus tenderness  Throat:   Lips, mucosa, and tongue normal; teeth and gums normal  Neck:   Supple, symmetrical, trachea midline, no adenopathy;       thyroid:  No enlargement/tenderness/nodules; no carotid   bruit or JVD  Back:     Symmetric, no curvature, ROM normal, no CVA tenderness  Lungs:     Clear to auscultation bilaterally, respirations unlabored  Chest wall:    No tenderness or deformity  Heart:    Regular rate and rhythm, S1 and S2 normal, no murmur, rub   or gallop  Abdomen:     Soft, non-tender, bowel sounds active all  four quadrants,    no masses, no organomegaly  Genitalia:    deferred  Rectal:    deferred  Extremities:   Extremities normal, atraumatic, no cyanosis or edema  Pulses:   2+ and symmetric all extremities  Skin:   Skin color, texture, turgor normal, no rashes or lesions  Lymph nodes:   Cervical, supraclavicular, and axillary nodes normal  Neurologic:   CNII-XII intact. Normal strength, sensation and reflexes      throughout    Activities of Daily Living No flowsheet data found.  Fall Risk Assessment No flowsheet data found.   Depression Screen PHQ 2/9 Scores 01/24/2015  PHQ - 2 Score 4  PHQ- 9 Score 11        Assessment & Plan:     Annual Wellness Visit  Reviewed patient's Family Medical History Reviewed and updated list of patient's medical providers Assessment of cognitive impairment was done Assessed patient's functional ability Established a written schedule for health screening services Health Risk Assessent Completed and Reviewed  Exercise Activities and Dietary  recommendations Goals    None      Immunization History  Administered Date(s) Administered  . Pneumococcal Polysaccharide-23 11/27/2013  . Tdap 11/25/2012    Health Maintenance  Topic Date Due  . Hepatitis C Screening  04-08-1956  . OPHTHALMOLOGY EXAM  03/28/1967  . COLONOSCOPY  03/28/2007  . INFLUENZA VACCINE  01/07/2015  . HEMOGLOBIN A1C  04/05/2015  . PNEUMOCOCCAL POLYSACCHARIDE VACCINE (2) 11/28/2018  . TETANUS/TDAP  11/26/2022      Discussed health benefits of physical activity, and encouraged him to engage in regular exercise appropriate for his age and condition.    ------------------------------------------------------------------------------------------------------------  1. Annual physical exam Generally doing well. Consider cologuard since Colonoscopy was not able to be completed when attempted in 2014.  - PSA  2. Diabetes mellitus type 2, uncontrolled Doing well current medications.  - Hemoglobin A1c - Renal function panel  3. Hypercholesteremia He is tolerating simvastatin well with no adverse effects.   - Lipid panel - Hepatic function panel - TSH  4. Hypertension, essential, benign Stable. Continue current medications.   - EKG 12-Lead  5. Hypogonadism, male Doing well on current testosterone replacement, managed by Dr. Tedd Sias.

## 2015-01-25 LAB — HEPATIC FUNCTION PANEL
ALK PHOS: 84 IU/L (ref 39–117)
ALT: 31 IU/L (ref 0–44)
AST: 26 IU/L (ref 0–40)
BILIRUBIN, DIRECT: 0.11 mg/dL (ref 0.00–0.40)
Bilirubin Total: 0.3 mg/dL (ref 0.0–1.2)
TOTAL PROTEIN: 7.4 g/dL (ref 6.0–8.5)

## 2015-01-25 LAB — RENAL FUNCTION PANEL
ALBUMIN: 4.4 g/dL (ref 3.5–5.5)
BUN/Creatinine Ratio: 12 (ref 9–20)
BUN: 13 mg/dL (ref 6–24)
CALCIUM: 9.6 mg/dL (ref 8.7–10.2)
CHLORIDE: 99 mmol/L (ref 97–108)
CO2: 25 mmol/L (ref 18–29)
Creatinine, Ser: 1.11 mg/dL (ref 0.76–1.27)
GFR calc Af Amer: 85 mL/min/{1.73_m2} (ref >59)
GFR calc non Af Amer: 73 mL/min/{1.73_m2} (ref >59)
Glucose: 143 mg/dL — ABNORMAL HIGH (ref 65–99)
PHOSPHORUS: 3.8 mg/dL (ref 2.5–4.5)
Potassium: 4.9 mmol/L (ref 3.5–5.2)
Sodium: 139 mmol/L (ref 134–144)

## 2015-01-25 LAB — LIPID PANEL
CHOLESTEROL TOTAL: 160 mg/dL (ref 100–199)
Chol/HDL Ratio: 3.6 ratio units (ref 0.0–5.0)
HDL: 45 mg/dL (ref >39)
LDL CALC: 90 mg/dL (ref 0–99)
TRIGLYCERIDES: 127 mg/dL (ref 0–149)
VLDL Cholesterol Cal: 25 mg/dL (ref 5–40)

## 2015-01-25 LAB — TSH: TSH: 2.49 u[IU]/mL (ref 0.450–4.500)

## 2015-01-25 LAB — PSA: Prostate Specific Ag, Serum: 0.8 ng/mL (ref 0.0–4.0)

## 2015-01-25 LAB — HEMOGLOBIN A1C
Est. average glucose Bld gHb Est-mCnc: 203 mg/dL
HEMOGLOBIN A1C: 8.7 % — AB (ref 4.8–5.6)

## 2015-01-29 ENCOUNTER — Telehealth: Payer: Self-pay

## 2015-01-29 NOTE — Telephone Encounter (Signed)
No answer and no voicemail set up.  Thanks,  -Rae Plotner

## 2015-01-29 NOTE — Telephone Encounter (Signed)
-----   Message from Malva Limes, MD sent at 01/25/2015  8:00 AM EDT ----- A1c is still too high at 8.7, need to get it below. 8.0.  Chol is pretty good at 160. Other labs are all normal. Need to be more strict with diet and try to get more exercise. Avoid all sweets and starches in diet. Follow up in 3-4 months. May need additional medications if sugars not doing better at follow up.

## 2015-01-31 NOTE — Telephone Encounter (Signed)
Tried to reach pt again, no answer and no vm.

## 2015-02-01 NOTE — Telephone Encounter (Signed)
Tried Calling patient no answer. Left message to call back.

## 2015-02-04 NOTE — Telephone Encounter (Signed)
Patient advised and verbally voiced understanding. Patient already has a appointment for follow up scheduled 04/26/2015  at 11am.

## 2015-02-25 ENCOUNTER — Other Ambulatory Visit: Payer: Self-pay | Admitting: Family Medicine

## 2015-02-25 DIAGNOSIS — IMO0002 Reserved for concepts with insufficient information to code with codable children: Secondary | ICD-10-CM

## 2015-02-25 DIAGNOSIS — E1165 Type 2 diabetes mellitus with hyperglycemia: Secondary | ICD-10-CM

## 2015-02-25 NOTE — Telephone Encounter (Signed)
This pt is requesting invokana 300 mg, his last ov was on 01/24/2015. Next ov appointment is on 04/26/2015.   Please advise,  Thanks,

## 2015-02-25 NOTE — Telephone Encounter (Signed)
Pt called wanting to know if he could get some samples of the invokana 300mg .  His call back is [phone]  Thanks Barth Kirks

## 2015-02-27 NOTE — Telephone Encounter (Signed)
Pt is requesting samples of canagliflozin (INVOKANA) 300 MG TABS tablet  and he called back because he hasn't heard back. Please advise. Thanks TNP

## 2015-02-28 MED ORDER — CANAGLIFLOZIN 300 MG PO TABS: ORAL_TABLET | ORAL | Status: DC

## 2015-02-28 NOTE — Telephone Encounter (Signed)
Samples ready to pick-up and rx sent to [facility]. Patient notified.

## 2015-02-28 NOTE — Telephone Encounter (Signed)
He can have 3 sample bottles if we have them. Please send in prescription for #30, rf x 6 to pharmacy of his choice.

## 2015-03-06 IMAGING — CR DG ABDOMEN 1V
1 series · 2 of 2 positions shown · non-contrast
Comparison: none

REASON FOR EXAM: tspine pain dyspnea on exertion
COMMENTS:

[Series 1: supine kub · 0.17mm/px · 2 of 2 slices shown]
[im 1/2]
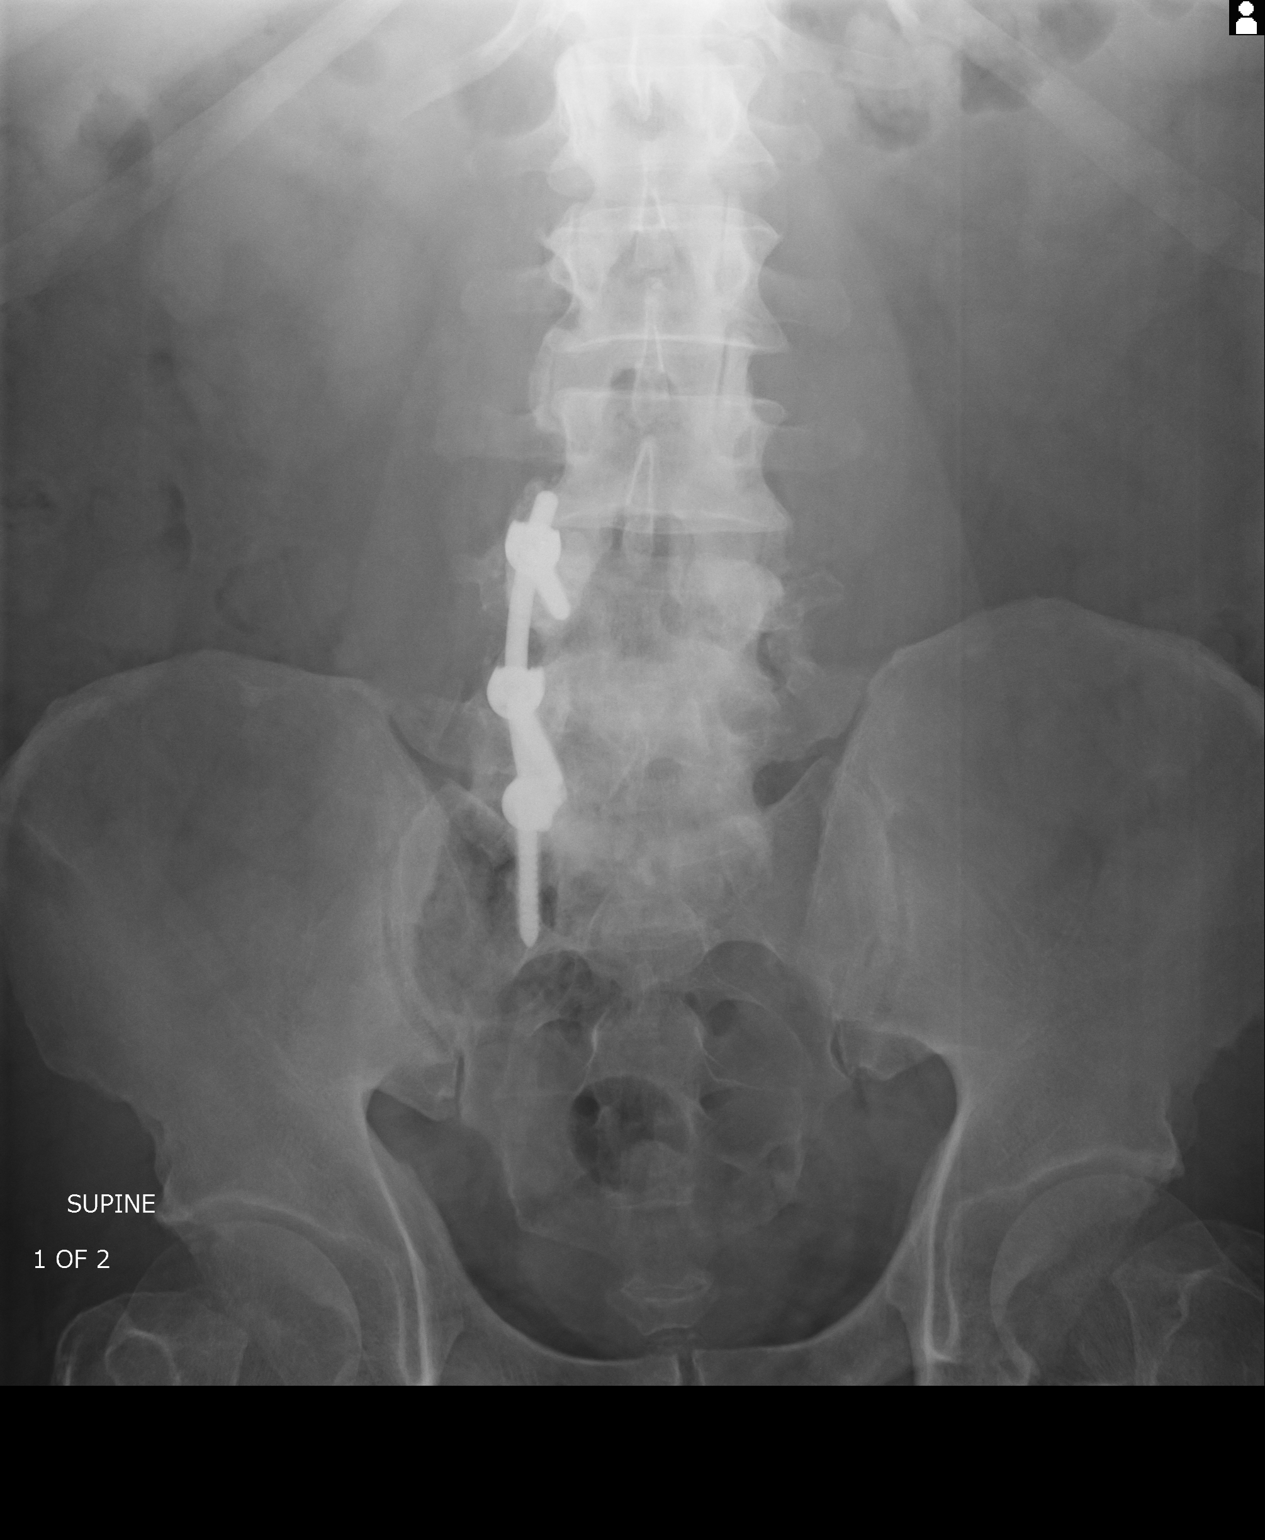
[im 2/2]
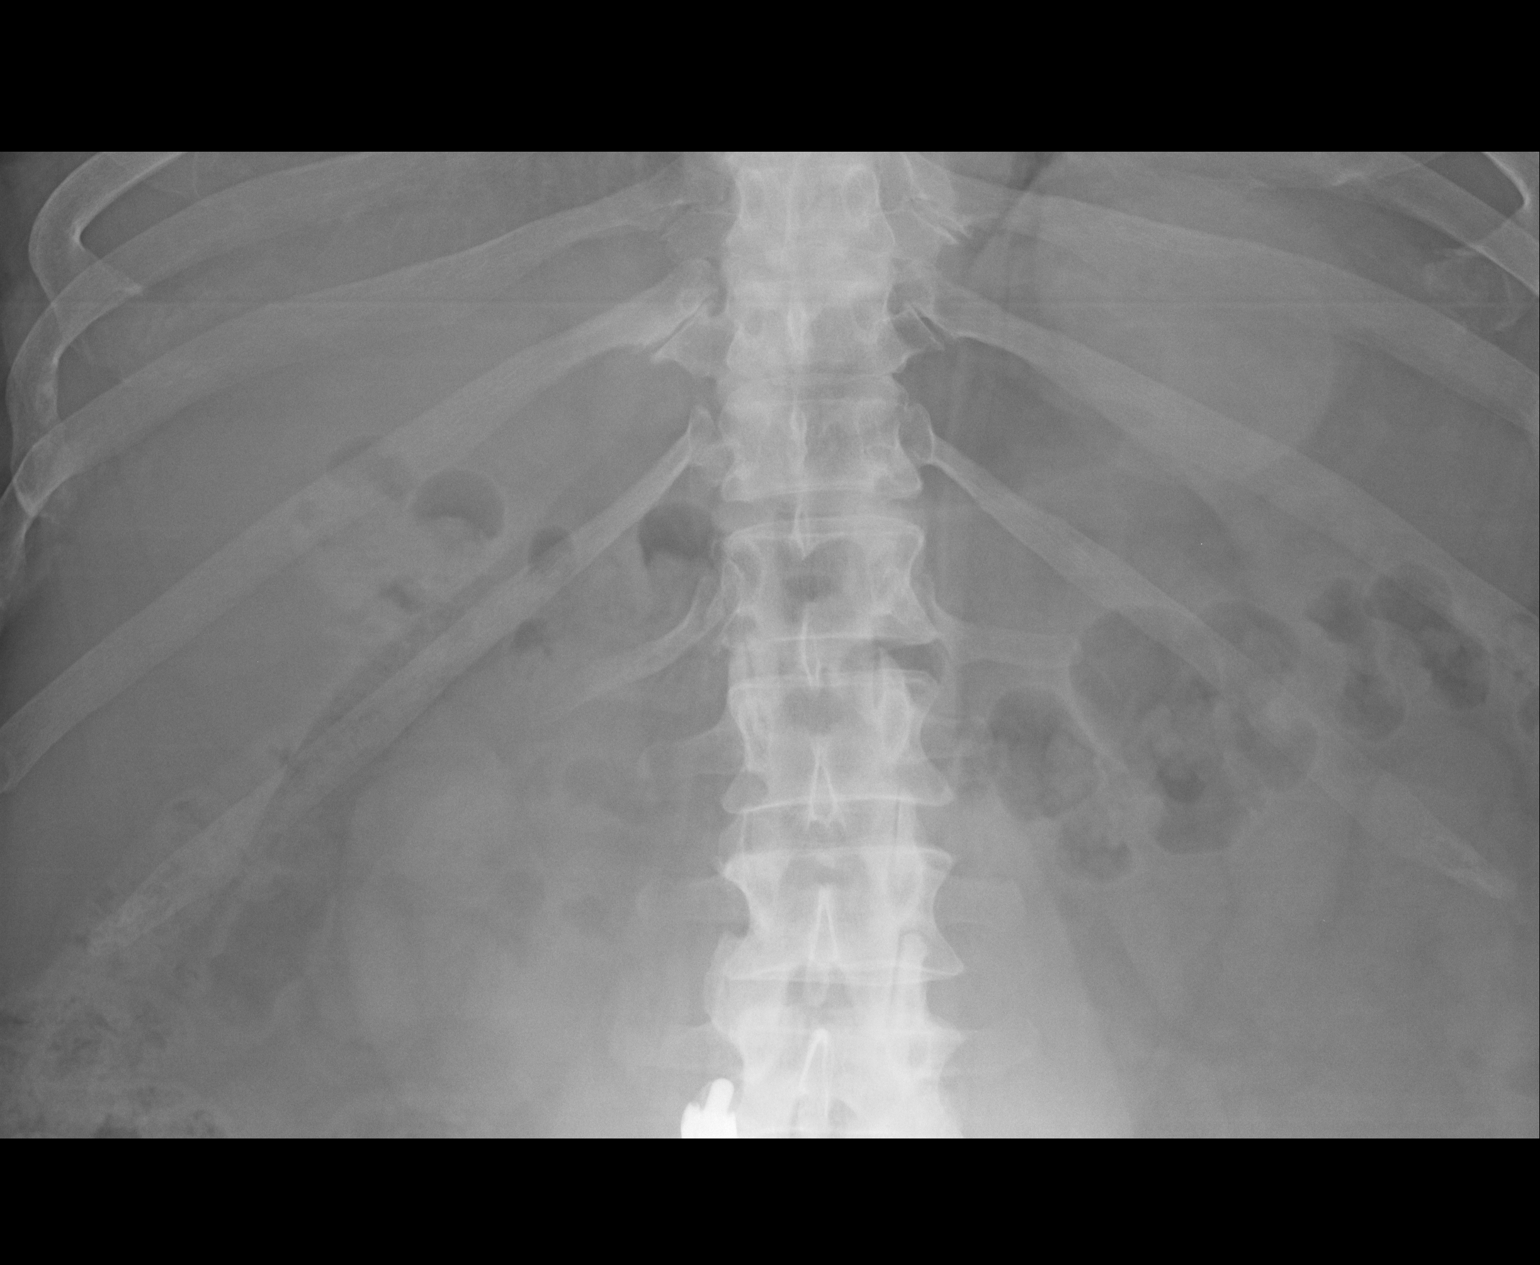

[2 of 2 positions shown; findings below may reference images not displayed]

PROCEDURE:     KDR - KDXR KIDNEY URETER BLADDER  - September 01, 2012 [DATE]

RESULT:     And pedicle screws are present on the right with posterior
stabilization rod involving the S1, L5 and L4 levels. There is no definite
nephrolithiasis appreciated although the left kidney is partially skewered
by bowel content. No ureteral calculi are evident. The bowel gas pattern is
unremarkable. The bony structures show no acute abnormality evident.
IMPRESSION: 1. No focal abdominal or pelvic acute abnormality. Chronic changes as
described.

[REDACTED]

## 2015-03-25 ENCOUNTER — Other Ambulatory Visit: Payer: Self-pay | Admitting: *Deleted

## 2015-03-26 MED ORDER — METFORMIN HCL 1000 MG PO TABS: 1000.0000 mg | ORAL_TABLET | Freq: Two times a day (BID) | ORAL | Status: DC

## 2015-04-26 ENCOUNTER — Ambulatory Visit: Payer: Medicare Other | Admitting: Family Medicine

## 2015-05-07 ENCOUNTER — Encounter: Payer: Self-pay | Admitting: Family Medicine

## 2015-05-07 ENCOUNTER — Ambulatory Visit (INDEPENDENT_AMBULATORY_CARE_PROVIDER_SITE_OTHER): Payer: Medicare Other | Admitting: Family Medicine

## 2015-05-07 VITALS — BP 154/80 | HR 70 | Temp 99.2°F | Resp 16 | Ht 71.5 in | Wt 323.0 lb

## 2015-05-07 DIAGNOSIS — M546 Pain in thoracic spine: Secondary | ICD-10-CM | POA: Diagnosis not present

## 2015-05-07 DIAGNOSIS — K760 Fatty (change of) liver, not elsewhere classified: Secondary | ICD-10-CM

## 2015-05-07 DIAGNOSIS — E669 Obesity, unspecified: Secondary | ICD-10-CM

## 2015-05-07 DIAGNOSIS — L989 Disorder of the skin and subcutaneous tissue, unspecified: Secondary | ICD-10-CM

## 2015-05-07 DIAGNOSIS — IMO0001 Reserved for inherently not codable concepts without codable children: Secondary | ICD-10-CM

## 2015-05-07 DIAGNOSIS — E1165 Type 2 diabetes mellitus with hyperglycemia: Secondary | ICD-10-CM

## 2015-05-07 LAB — POCT GLYCOSYLATED HEMOGLOBIN (HGB A1C)
Est. average glucose Bld gHb Est-mCnc: 249
HEMOGLOBIN A1C: 10.3

## 2015-05-07 MED ORDER — DAPAGLIFLOZIN PROPANEDIOL 5 MG PO TABS: 5.0000 mg | ORAL_TABLET | Freq: Every day | ORAL | Status: DC

## 2015-05-07 MED ORDER — CANAGLIFLOZIN 300 MG PO TABS: ORAL_TABLET | ORAL | Status: DC

## 2015-05-07 MED ORDER — PREDNISONE 10 MG PO TABS: ORAL_TABLET | ORAL | Status: AC

## 2015-05-07 NOTE — Progress Notes (Signed)
Patient: Taylor [name] Male    DOB: [birth year]   59 y.o.   MRN: [id number] Visit Date: 05/07/2015  Today's Provider: Mila Merry, MD   Chief Complaint  Patient presents with  . Flank Pain    Right side   Subjective:    HPI   Flank Pain Complain of right flank pain for the last 10 days. Has had similar pains in the past. Sometimes radiates to front and sometimes back to right shoulder blade. Is identical to pain he had in April which resolved on 6 day prednisone taper.    Diabetes Mellitus Type II, Follow-up:   Lab Results  Component Value Date   HGBA1C 8.7* 01/24/2015   HGBA1C 8.7* 10/04/2014    Last seen for diabetes 3 months ago.  Management since then includes; advised to be more strict with diet. He states he has been out of Invokana for a few months which he stopped due to expense and due to having headaches when he was taking it.  He states that blood sugars were not much different when he was taking it. He is not having side effects.  Home blood sugar records: fasting range: 180-200  Episodes of hypoglycemia? no   Current Insulin Regimen: no Most Recent Eye Exam:  Weight trend: stable Prior visit with dietician: no Current diet: well balanced Current exercise: none       Component Value Date/Time   CHOL 160 01/24/2015 1146   TRIG 127 01/24/2015 1146   HDL 45 01/24/2015 1146   LDLCALC 90 01/24/2015 1146   CREATININE 1.11 01/24/2015 1146   CREATININE 1.1 08/01/2014    Wt Readings from Last 3 Encounters:  05/07/15 323 lb (146.512 kg)  01/24/15 317 lb (143.79 kg)  11/15/14 315 lb (142.883 kg)    ----------------------------------------------------------------------   Skin lesion: Has sore on the lateral aspect of his left leg which he states has been there since March. He has applied multiple topical medications including neosporin and hydrocortisone which have not helped. Lesion when appear to come close to healing, then flare back  up. It is itchy and sometimes feels sore, but is not spreading.   No Known Allergies Previous Medications   ASPIRIN 81 MG TABLET    Take by mouth.   CANAGLIFLOZIN (INVOKANA) 300 MG TABS TABLET    Take 1 tablet daily   GLIPIZIDE (GLUCOTROL XL) 10 MG 24 HR TABLET    TAKE ONE TABLET BY MOUTH ONCE DAILY   HYDROMORPHONE (DILAUDID) 4 MG TABLET    Take 4 mg by mouth every 6 (six) hours as needed.    IBUPROFEN-FAMOTIDINE 800-26.6 MG TABS    Take 1 tablet by mouth 3 (three) times daily as needed.   LISINOPRIL (PRINIVIL,ZESTRIL) 10 MG TABLET    Take 1 tablet (10 mg total) by mouth daily.   METFORMIN (GLUCOPHAGE) 1000 MG TABLET    Take 1 tablet (1,000 mg total) by mouth 2 (two) times daily.   OXYCODONE HCL    Take 40 mg by mouth every 4 (four) hours as needed.    PREGABALIN (LYRICA) 75 MG CAPSULE    Take by mouth.   SIMVASTATIN (ZOCOR) 40 MG TABLET    Take by mouth.   TESTOSTERONE IM       ZOLPIDEM (AMBIEN CR) 12.5 MG CR TABLET    AMBIEN CR, 12.5MG  (Oral Tablet Extended Release)  1 PO QHS for 0 days  Quantity: 30.00;  Refills: 0   Ordered :15-Jul-2010  Nilda Simmer MD;  Mora Appl 03-Mar-2007 Active    Review of Systems  Constitutional: Negative for fever, chills and appetite change.  Respiratory: Negative for chest tightness, shortness of breath and wheezing.   Cardiovascular: Negative for chest pain and palpitations.  Gastrointestinal: Negative for nausea, vomiting and abdominal pain.  Genitourinary: Positive for flank pain.       Right side pain    Social History  Substance Use Topics  . Smoking status: Never Smoker   . Smokeless tobacco: Not on file  . Alcohol Use: No   Objective:   BP 154/80 mmHg  Pulse 70  Temp(Src) 99.2 F (37.3 C) (Oral)  Resp 16  Ht 5' 11.5" (1.816 m)  Wt 323 lb (146.512 kg)  BMI 44.43 kg/m2  Physical Exam   General Appearance:    Alert, cooperative, no distress, obese  Eyes:    PERRL, conjunctiva/corneas clear, EOM's intact       Lungs:     Clear to  auscultation bilaterally, respirations unlabored  Heart:    Regular rate and rhythm  Neurologic:   Awake, alert, oriented x 3. No apparent focal neurological           defect.   Skin:   About 4mm round erythematous lesion left lateral lower leg as per HPI.   MS:   Diffuse area of tenderness right flank, posterior ribs and para-thoracic muscles, some spasm noted.      Results for orders placed or performed in visit on 05/07/15  POCT glycosylated hemoglobin (Hb A1C)  Result Value Ref Range   Hemoglobin A1C 10.3    Est. average glucose Bld gHb Est-mCnc 249        Assessment & Plan:      1. Uncontrolled type 2 diabetes mellitus without complication, without long-term current use of insulin (HCC) Stopped Invokana due to cost and headaches without informing me. Extensively counseled on importance of diet, exercise, and medication compliance. Discussed trial of injectable Victoza versus another oral agent. Will try Marcelline Deist for now. He is to call if any adverse effects. Otherwise return with readings of daily fasting blood sugars in 6 weeks.  - POCT glycosylated hemoglobin (Hb A1C) - dapagliflozin propanediol (FARXIGA) 5 MG TABS tablet; Take 5 mg by mouth daily.  Dispense: 42 tablet; Refill: 0  2. Right-sided thoracic back pain He states that prednisone was very effective for similar pain earlier this year. He is to call if it does not rapidly improve and resolve completley when finished with prednisone.  - predniSONE (DELTASONE) 10 MG tablet; 6 tablets for 1 day, then 5 for 1 day, then 4 for 1 day, then 3 for 1 day, then 2 for 1 day then 1 for 1 day.  Dispense: 21 tablet; Refill: 0  3. Sore on leg Non-healing lesion for 8 months - Ambulatory referral to Dermatology  4. Obesity Dietary counseling.    Addressed extensive list of chronic and acute medical problems today requiring extensive time in counseling and coordination care.  Over half of this 45 minute visit were spent in counseling  and coordinating care of multiple medical problems.       Mila Merry, MD  [facility]

## 2015-06-18 ENCOUNTER — Ambulatory Visit: Payer: Medicare Other | Admitting: Family Medicine

## 2015-06-21 ENCOUNTER — Telehealth: Payer: Self-pay

## 2015-06-21 MED ORDER — SIMVASTATIN 40 MG PO TABS: 40.0000 mg | ORAL_TABLET | Freq: Every day | ORAL | Status: DC

## 2015-06-21 NOTE — Telephone Encounter (Signed)
Fax from [facility] pharmacy requesting refill on Simvastatin. LOV was November 2016. Please review-aa

## 2015-06-27 ENCOUNTER — Encounter: Payer: Self-pay | Admitting: Family Medicine

## 2015-06-27 ENCOUNTER — Ambulatory Visit (INDEPENDENT_AMBULATORY_CARE_PROVIDER_SITE_OTHER): Payer: Medicare Other | Admitting: Family Medicine

## 2015-06-27 VITALS — BP 190/100 | HR 80 | Temp 98.0°F | Resp 20 | Wt 325.0 lb

## 2015-06-27 DIAGNOSIS — IMO0001 Reserved for inherently not codable concepts without codable children: Secondary | ICD-10-CM

## 2015-06-27 DIAGNOSIS — E1165 Type 2 diabetes mellitus with hyperglycemia: Secondary | ICD-10-CM

## 2015-06-27 DIAGNOSIS — Z7984 Long term (current) use of oral hypoglycemic drugs: Secondary | ICD-10-CM | POA: Diagnosis not present

## 2015-06-27 LAB — POCT GLYCOSYLATED HEMOGLOBIN (HGB A1C)
Est. average glucose Bld gHb Est-mCnc: 269
HEMOGLOBIN A1C: 11

## 2015-06-27 MED ORDER — DAPAGLIFLOZIN PROPANEDIOL 10 MG PO TABS: 10.0000 mg | ORAL_TABLET | Freq: Every day | ORAL | Status: DC

## 2015-06-27 NOTE — Progress Notes (Signed)
Patient: Taylor [name] Male    DOB: 10/02/1955   60 y.o.   MRN: [id number] Visit Date: 06/27/2015  Today's Provider: Mila Merry, MD   Chief Complaint  Patient presents with  . Diabetes    follow up   Subjective:    HPI   Diabetes Mellitus Type II, Follow-up:   Lab Results  Component Value Date   HGBA1C 10.3 05/07/2015   HGBA1C 8.7* 01/24/2015   HGBA1C 8.7* 10/04/2014    Last seen for diabetes 8 weeks ago.  Management since then includes starting Farxiga 5mg  daily. Patient was counseled on diet and exercise and medication compliance.Marland Kitchen He reports good compliance with treatment. He is not having side effects.  Current symptoms include none and have been stable. Home blood sugar records: fasting range: 200-300  Episodes of hypoglycemia? no   Current Insulin Regimen: none Most Recent Eye Exam: >1 year ago Weight trend: stable Prior visit with dietician: no Current diet: in general, a "healthy" diet   Current exercise: none  Pertinent Labs:    Component Value Date/Time   CHOL 160 01/24/2015 1146   TRIG 127 01/24/2015 1146   HDL 45 01/24/2015 1146   LDLCALC 90 01/24/2015 1146   CREATININE 1.11 01/24/2015 1146   CREATININE 1.1 08/01/2014    Wt Readings from Last 3 Encounters:  06/27/15 325 lb (147.419 kg)  05/07/15 323 lb (146.512 kg)  01/24/15 317 lb (143.79 kg)    ------------------------------------------------------------------------ Review of blood sugar diary shows sugars in the upper 100s and low 200s througout most of December, but in the upper 200s and lower 300s after he ran out of [place] last week. He had occasional mild headaches when he was taking [place], but states he tolerated it much better than Invokana      No Known Allergies Previous Medications   ASPIRIN 81 MG TABLET    Take by mouth.   DAPAGLIFLOZIN PROPANEDIOL (FARXIGA) 5 MG TABS TABLET    Take 5 mg by mouth daily.   GLIPIZIDE (GLUCOTROL XL) 10 MG 24 HR TABLET    TAKE  ONE TABLET BY MOUTH ONCE DAILY   HYDROMORPHONE (DILAUDID) 4 MG TABLET    Take 4 mg by mouth every 6 (six) hours as needed.    IBUPROFEN-FAMOTIDINE 800-26.6 MG TABS    Take 1 tablet by mouth 3 (three) times daily as needed.   LISINOPRIL (PRINIVIL,ZESTRIL) 10 MG TABLET    Take 1 tablet (10 mg total) by mouth daily.   METFORMIN (GLUCOPHAGE) 1000 MG TABLET    Take 1 tablet (1,000 mg total) by mouth 2 (two) times daily.   OXYCODONE HCL    Take 40 mg by mouth every 4 (four) hours as needed.    PREGABALIN (LYRICA) 75 MG CAPSULE    Take by mouth.   SIMVASTATIN (ZOCOR) 40 MG TABLET    Take 1 tablet (40 mg total) by mouth daily.   TESTOSTERONE IM       ZOLPIDEM (AMBIEN CR) 12.5 MG CR TABLET    AMBIEN CR, 12.5MG  (Oral Tablet Extended Release)  1 PO QHS for 0 days  Quantity: 30.00;  Refills: 0   Ordered :15-Jul-2010  Nilda Simmer MD;  Started 03-Mar-2007 Active    Review of Systems  Constitutional: Negative for fever, chills and appetite change.  Respiratory: Negative for chest tightness, shortness of breath and wheezing.   Cardiovascular: Negative for chest pain and palpitations.  Gastrointestinal: Negative for nausea, vomiting and abdominal pain.  Endocrine: Negative for polydipsia, polyphagia and polyuria.    Social History  Substance Use Topics  . Smoking status: Never Smoker   . Smokeless tobacco: Not on file  . Alcohol Use: No   Objective:   BP 190/100 mmHg  Pulse 80  Temp(Src) 98 F (36.7 C) (Oral)  Resp 20  Wt 325 lb (147.419 kg)  SpO2 97%  Physical Exam  General appearance: alert, well developed, well nourished, cooperative and in no distress, morbidly obese Head: Normocephalic, without obvious abnormality, atraumatic Lungs: Respirations even and unlabored Extremities: No gross deformities Skin: Skin color, texture, turgor normal. No rashes seen  Psych: Appropriate mood and affect. Neurologic: Mental status: Alert, oriented to person, place, and time, thought content  appropriate.  Results for orders placed or performed in visit on 06/27/15  POCT HgB A1C  Result Value Ref Range   Hemoglobin A1C 11.0    Est. average glucose Bld gHb Est-mCnc 269        Assessment & Plan:     1. Uncontrolled type 2 diabetes mellitus without complication, without long-term current use of insulin (HCC) Increase Farxiga to  daily. Call if any side effects. Given 14 sample tablets. Return in 2 months to check A1c. Try to increase exercise to help lose weight.  - POCT HgB A1C - dapagliflozin propanediol (FARXIGA) 10 MG TABS tablet; Take 10 mg by mouth daily.  Dispense: 30 tablet; Refill: 3       Mila Merry, MD  [facility]

## 2015-07-09 ENCOUNTER — Telehealth: Payer: Self-pay | Admitting: Family Medicine

## 2015-07-19 ENCOUNTER — Other Ambulatory Visit: Payer: Self-pay | Admitting: Family Medicine

## 2015-08-21 DIAGNOSIS — E291 Testicular hypofunction: Secondary | ICD-10-CM | POA: Diagnosis not present

## 2015-08-29 ENCOUNTER — Ambulatory Visit: Payer: Medicare Other | Admitting: Family Medicine

## 2015-09-25 ENCOUNTER — Encounter: Payer: Self-pay | Admitting: Family Medicine

## 2015-09-25 ENCOUNTER — Ambulatory Visit (INDEPENDENT_AMBULATORY_CARE_PROVIDER_SITE_OTHER): Payer: Medicare Other | Admitting: Family Medicine

## 2015-09-25 VITALS — BP 160/88 | HR 84 | Temp 98.7°F | Resp 16 | Wt 325.0 lb

## 2015-09-25 DIAGNOSIS — IMO0001 Reserved for inherently not codable concepts without codable children: Secondary | ICD-10-CM

## 2015-09-25 DIAGNOSIS — E1165 Type 2 diabetes mellitus with hyperglycemia: Secondary | ICD-10-CM | POA: Diagnosis not present

## 2015-09-25 DIAGNOSIS — E7800 Pure hypercholesterolemia, unspecified: Secondary | ICD-10-CM

## 2015-09-25 DIAGNOSIS — I10 Essential (primary) hypertension: Secondary | ICD-10-CM

## 2015-09-25 LAB — POCT GLYCOSYLATED HEMOGLOBIN (HGB A1C)
ESTIMATED AVERAGE GLUCOSE: 298
Hemoglobin A1C: 12

## 2015-09-25 MED ORDER — DAPAGLIFLOZIN PROPANEDIOL 10 MG PO TABS: 10.0000 mg | ORAL_TABLET | Freq: Every day | ORAL | Status: DC

## 2015-09-25 NOTE — Progress Notes (Signed)
Patient: Taylor [name] Male    DOB: [birth year]   60 y.o.   MRN: [id number] Visit Date: 09/25/2015  Today's Provider: Mila Merry, MD   Chief Complaint  Patient presents with  . Diabetes    follow up   Subjective:    HPI  Diabetes Mellitus Type II, Follow-up:   Lab Results  Component Value Date   HGBA1C 11.0 06/27/2015   HGBA1C 10.3 05/07/2015   HGBA1C 8.7* 01/24/2015    Last seen for diabetes 4 months ago.  Management since then includes increasing Farxiga to  daily (samples given). Patient was also counseled to start exercising to help loose weight. He reports poor compliance with treatment. Patient was unable to afford the [place]. He states it cost him $200. Patient has been taking Metformin and Glipizide as prescribed. Patient reports for the past 3 weeks he has been doing water exercises 2-3 times a week at the [facility] [address]. He is not having side effects.  Current symptoms include burning in feet and have been stable. Home blood sugar records: fasting range: 200-250  Episodes of hypoglycemia? no   Current Insulin Regimen: none Most Recent Eye Exam: >1 year ago Weight trend: stable Prior visit with dietician: no Current diet: well balanced Current exercise: water exercises  Pertinent Labs:    Component Value Date/Time   CHOL 160 01/24/2015 1146   TRIG 127 01/24/2015 1146   HDL 45 01/24/2015 1146   LDLCALC 90 01/24/2015 1146   CREATININE 1.11 01/24/2015 1146   CREATININE 1.1 08/01/2014    Wt Readings from Last 3 Encounters:  09/25/15 325 lb (147.419 kg)  06/27/15 325 lb (147.419 kg)  05/07/15 323 lb (146.512 kg)    ------------------------------------------------------------------------ Follow up hypertension States he is tolerating lisinopril well with no adverse effects. No chest pain or heart flutters.   Follow up cholesterol Continues on simvastatin which he states is tolerating well.  Lab Results  Component Value Date   CHOL 160  01/24/2015   HDL 45 01/24/2015   LDLCALC 90 01/24/2015   TRIG 127 01/24/2015   CHOLHDL 3.6 01/24/2015       No Known Allergies Previous Medications   ASPIRIN 81 MG TABLET    Take by mouth.   DAPAGLIFLOZIN PROPANEDIOL (FARXIGA) 10 MG TABS TABLET    Take 10 mg by mouth daily.   GLIPIZIDE (GLUCOTROL XL) 10 MG 24 HR TABLET    TAKE ONE TABLET BY MOUTH ONCE DAILY   HYDROMORPHONE (DILAUDID) 4 MG TABLET    Take 4 mg by mouth every 6 (six) hours as needed.    IBUPROFEN-FAMOTIDINE 800-26.6 MG TABS    Take 1 tablet by mouth 3 (three) times daily as needed.   LISINOPRIL (PRINIVIL,ZESTRIL) 10 MG TABLET    TAKE 1 TABLET ONCE DAILY   METFORMIN (GLUCOPHAGE) 1000 MG TABLET    TAKE (1) TABLET TWICE A DAY.   OXYCODONE HCL    Take 40 mg by mouth every 4 (four) hours as needed.    PREGABALIN (LYRICA) 75 MG CAPSULE    Take by mouth.   SIMVASTATIN (ZOCOR) 40 MG TABLET    Take 1 tablet (40 mg total) by mouth daily.   TESTOSTERONE IM       ZOLPIDEM (AMBIEN CR) 12.5 MG CR TABLET    AMBIEN CR, 12.5MG  (Oral Tablet Extended Release)  1 PO QHS for 0 days  Quantity: 30.00;  Refills: 0   Ordered :15-Jul-2010  Nilda Simmer MD;  Mora Appl 03-Mar-2007  Active    Review of Systems  Constitutional: Negative for fever, chills and appetite change.  Respiratory: Negative for chest tightness, shortness of breath and wheezing.   Cardiovascular: Negative for chest pain and palpitations.  Gastrointestinal: Negative for nausea, vomiting and abdominal pain.  Endocrine: Negative for polydipsia, polyphagia and polyuria.       Dry mouth  Musculoskeletal: Positive for back pain.    Social History  Substance Use Topics  . Smoking status: Never Smoker   . Smokeless tobacco: Not on file  . Alcohol Use: No   Objective:   BP 160/88 mmHg  Pulse 84  Temp(Src) 98.7 F (37.1 C) (Oral)  Resp 16  Wt 325 lb (147.419 kg)  SpO2 96%  Physical Exam   General Appearance:    Alert, cooperative, no distress  Eyes:    PERRL,  conjunctiva/corneas clear, EOM's intact       Lungs:     Clear to auscultation bilaterally, respirations unlabored  Heart:    Regular rate and rhythm  Neurologic:   Awake, alert, oriented x 3. No apparent focal neurological           defect.       Results for orders placed or performed in visit on 09/25/15  POCT HgB A1C  Result Value Ref Range   Hemoglobin A1C 12.0    Est. average glucose Bld gHb Est-mCnc 298        Assessment & Plan:     1. Uncontrolled type 2 diabetes mellitus without complication, without long-term current use of insulin (HCC) Was doing well with Marcelline Deist, but could not afford high copay, and states did not cover any alternatives. Will start back on samples 5mg  QD x 2 weeks, then increase to 10mg . Given 12 weeks of samples. Follow up mid July to check A1c. Call if any adverse effects.  - POCT HgB A1C   2. Hypertension, essential, benign Expect improvement of BP once he is on Farxiga. Continue current medications.    3. Hypercholesteremia He is tolerating simvastatin well with no adverse effects.          Mila Merry, MD  [facility]

## 2015-09-25 NOTE — Patient Instructions (Signed)
Start back on Farxiga 5mg  daily for 14 days, then increase to 10mg  daily

## 2015-09-29 IMAGING — US ABDOMEN ULTRASOUND LIMITED
1 series · 14 of 25 positions shown · non-contrast
Comparison: none

REASON FOR EXAM: RUQ Abd Pain Nausea
COMMENTS:

[Series 1: abdomen ultrasound limited · 0.30mm/px · 14 of 74 slices shown]
[im 1/74]
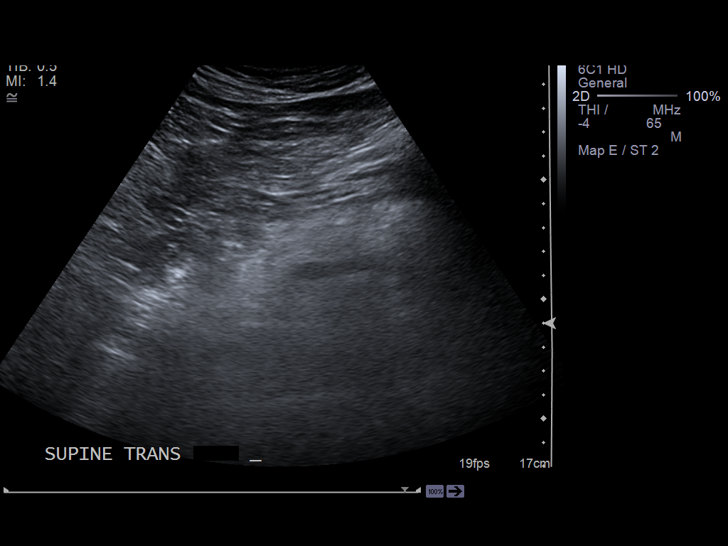
[im 7/74]
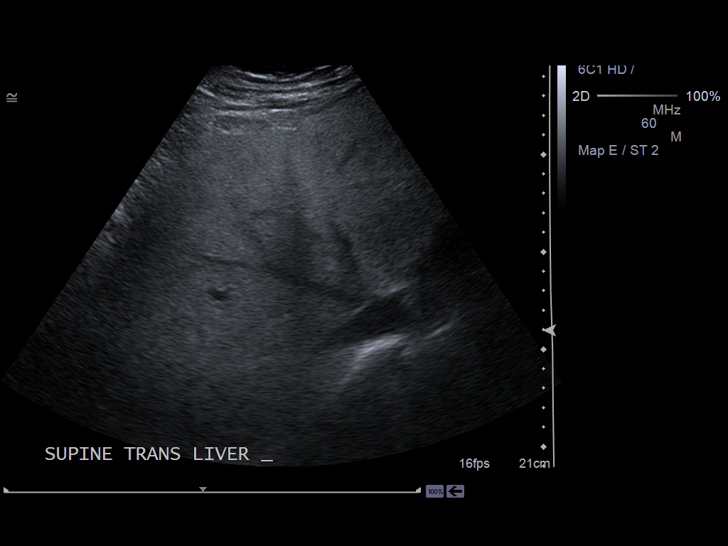
[im 13/74]
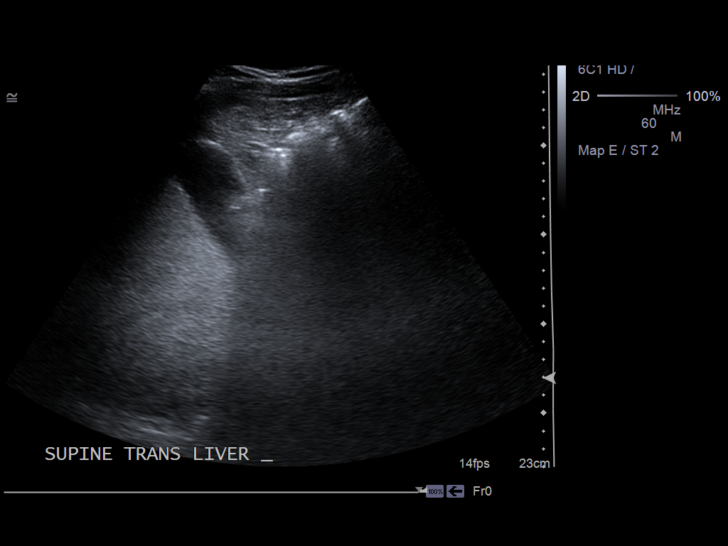
[im 19/74]
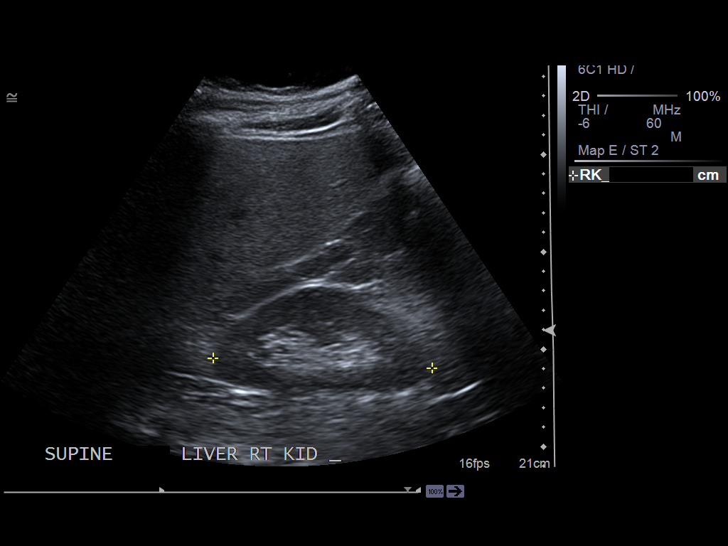
[im 25/74]
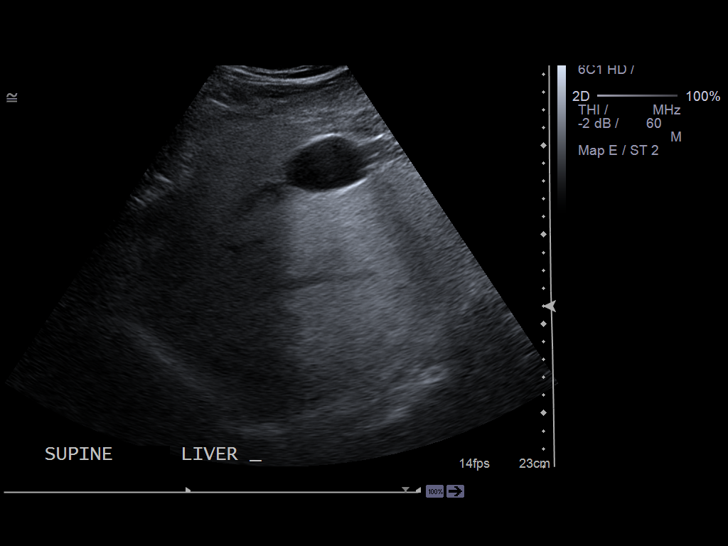
[im 28/74]
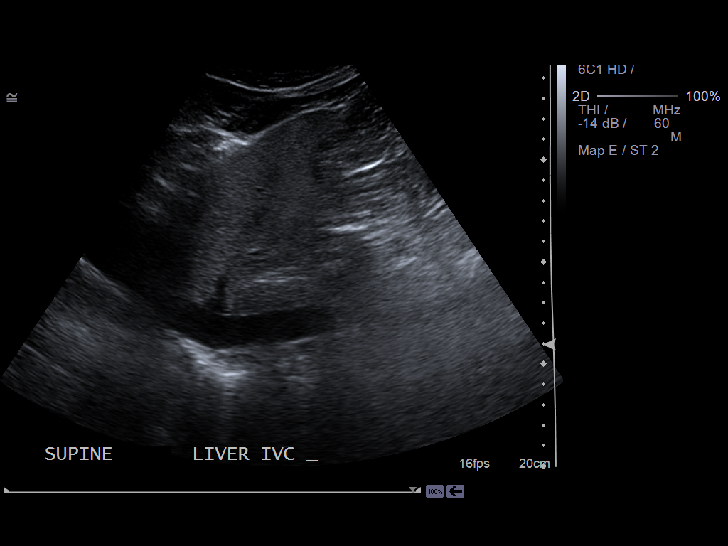
[im 34/74]
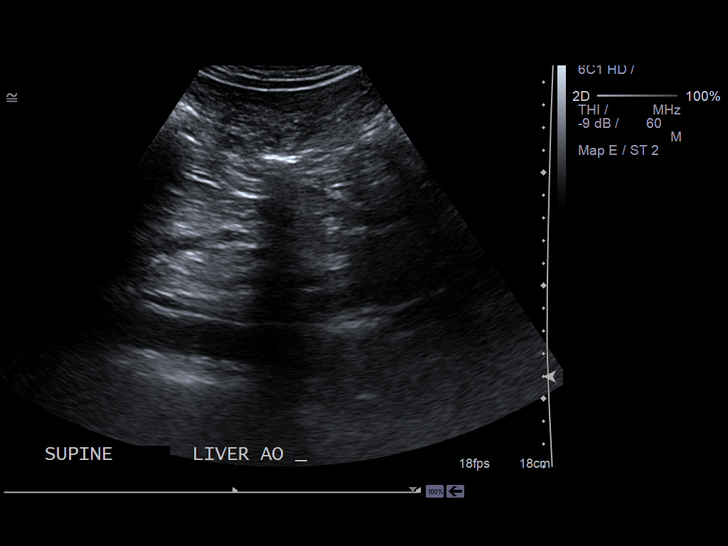
[im 40/74]
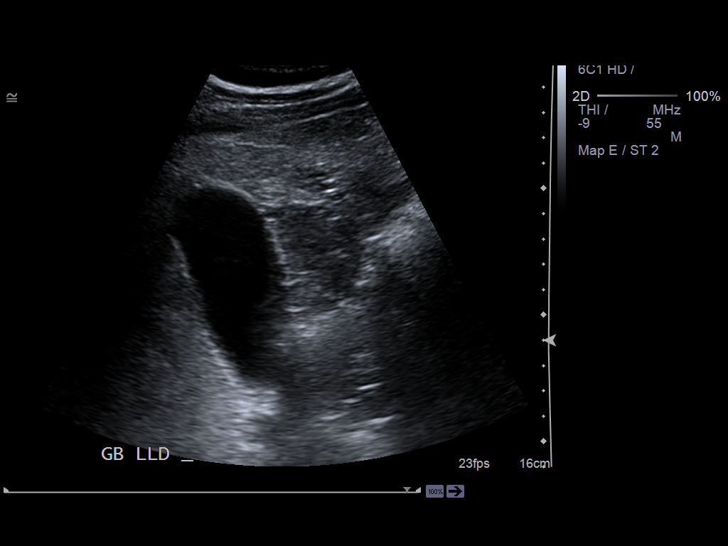
[im 46/74]
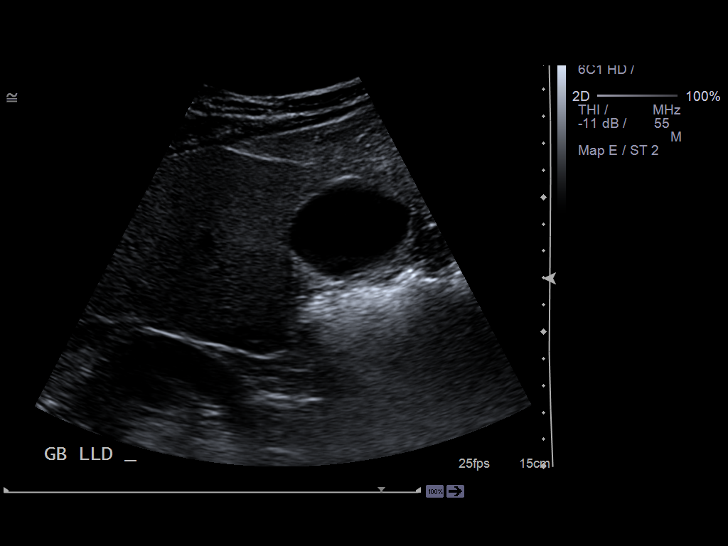
[im 49/74]
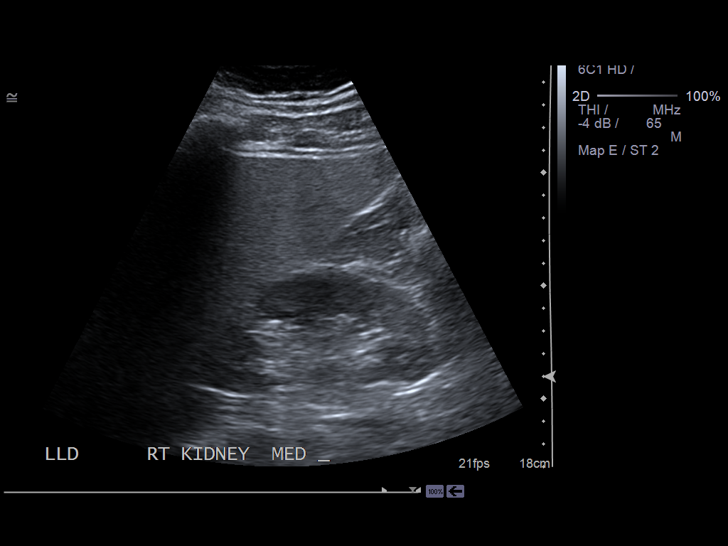
[im 55/74]
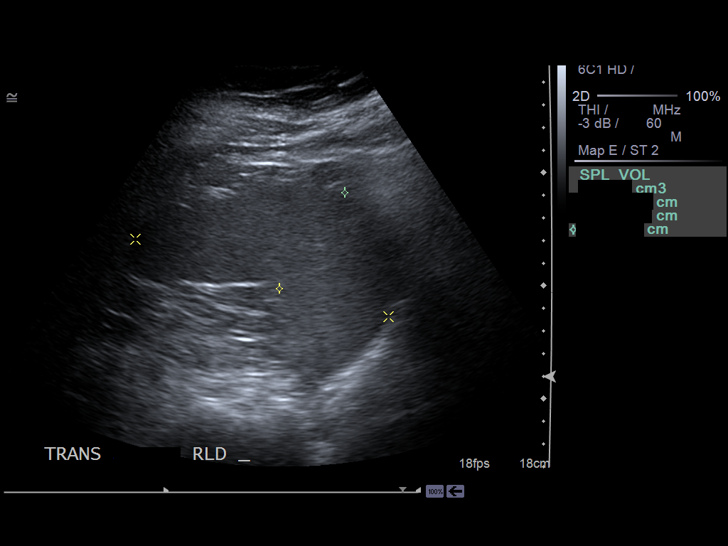
[im 61/74]
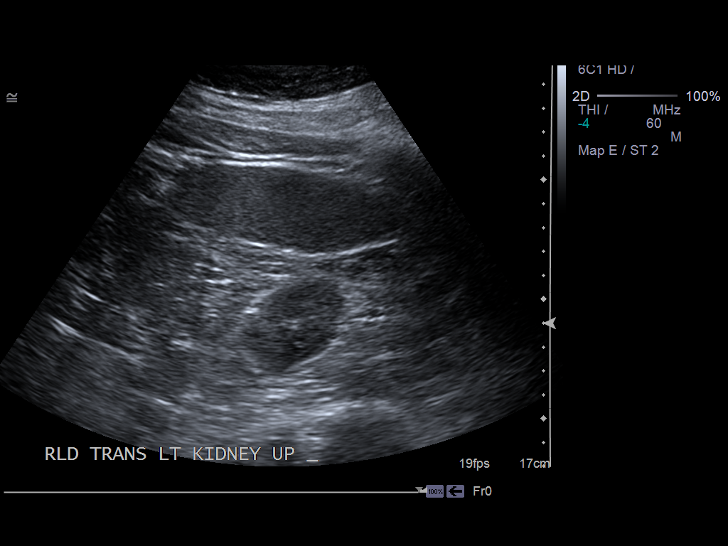
[im 67/74]
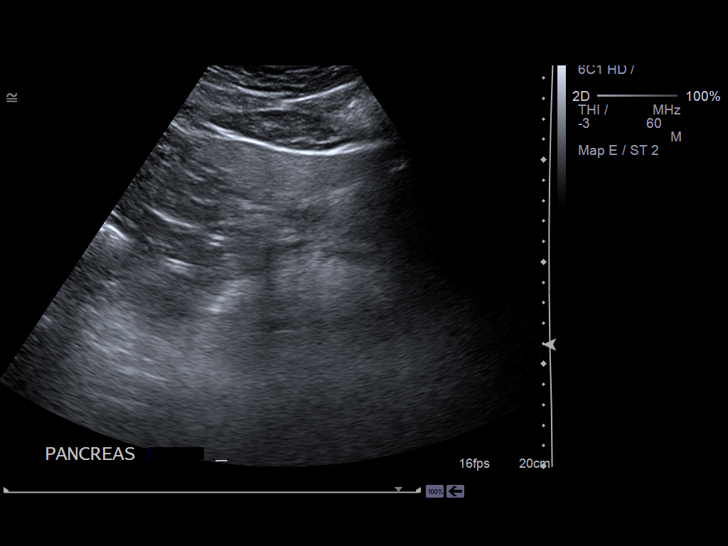
[im 74/74]
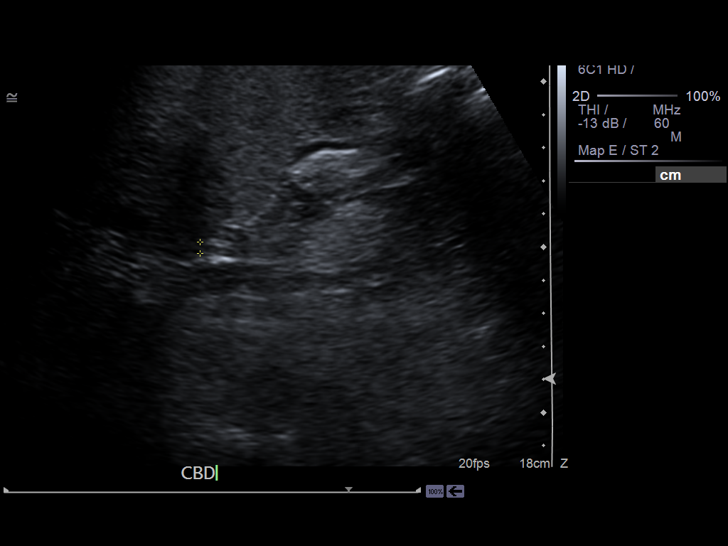

[14 of 25 positions shown; findings below may reference images not displayed]

PROCEDURE:     MOHN - MOHN ABDOMEN UPPER GENERAL  - March 27, 2013 [DATE]

RESULT:     The liver is enlarged measuring 21 cm in the midclavicular line.
Its echotexture is increased consistent with fatty infiltration. Portal
venous flow is normal in direction toward the liver. The pancreatic body was
visualized in the peri- pancreatic head and tail were obscured by bowel gas.
The gallbladder is adequately distended with no evidence of stones, wall
thickening, or pericholecystic fluid. There is no positive sonographic
Murphy's sign. The common bile duct measures 3.9 mm in diameter. The spleen
is top normal in size at 11.9 cm. The abdominal aorta and visualized
portions of the inferior vena cava appear normal. The kidneys exhibit no
evidence of acute obstruction nor other acute abnormalities.
IMPRESSION: 1. There is hepatomegaly with fatty infiltrative change is demonstrated. The
spleen is borderline enlarged as well.
2. No acute abnormality of the gallbladder or common bile duct and
visualized portions of the pancreas is seen.
3. There is no acute abnormality of the kidneys.

[REDACTED]

## 2015-11-14 ENCOUNTER — Other Ambulatory Visit: Payer: Self-pay | Admitting: Family Medicine

## 2015-11-14 DIAGNOSIS — IMO0001 Reserved for inherently not codable concepts without codable children: Secondary | ICD-10-CM

## 2015-11-14 DIAGNOSIS — I10 Essential (primary) hypertension: Secondary | ICD-10-CM

## 2015-11-14 DIAGNOSIS — E1165 Type 2 diabetes mellitus with hyperglycemia: Secondary | ICD-10-CM

## 2015-11-18 ENCOUNTER — Other Ambulatory Visit: Payer: Self-pay | Admitting: Family Medicine

## 2015-11-18 DIAGNOSIS — E1165 Type 2 diabetes mellitus with hyperglycemia: Principal | ICD-10-CM

## 2015-11-18 DIAGNOSIS — IMO0001 Reserved for inherently not codable concepts without codable children: Secondary | ICD-10-CM

## 2015-11-18 MED ORDER — METFORMIN HCL 1000 MG PO TABS: ORAL_TABLET | ORAL | Status: DC

## 2015-11-18 MED ORDER — GLIPIZIDE ER 10 MG PO TB24: 10.0000 mg | ORAL_TABLET | Freq: Every day | ORAL | Status: DC

## 2015-11-18 NOTE — Telephone Encounter (Signed)
Pt states he needs a refill of metformin and glipizide sent to [facility]" [phone] [facility].  Pt states he is out of medication.

## 2015-12-26 ENCOUNTER — Encounter: Payer: Self-pay | Admitting: Family Medicine

## 2015-12-26 ENCOUNTER — Ambulatory Visit (INDEPENDENT_AMBULATORY_CARE_PROVIDER_SITE_OTHER): Payer: Medicare Other | Admitting: Family Medicine

## 2015-12-26 VITALS — BP 140/86 | HR 81 | Temp 98.9°F | Resp 18 | Wt 321.0 lb

## 2015-12-26 DIAGNOSIS — I10 Essential (primary) hypertension: Secondary | ICD-10-CM

## 2015-12-26 DIAGNOSIS — E1165 Type 2 diabetes mellitus with hyperglycemia: Secondary | ICD-10-CM

## 2015-12-26 DIAGNOSIS — IMO0001 Reserved for inherently not codable concepts without codable children: Secondary | ICD-10-CM

## 2015-12-26 LAB — POCT GLYCOSYLATED HEMOGLOBIN (HGB A1C)
ESTIMATED AVERAGE GLUCOSE: 263
HEMOGLOBIN A1C: 10.8

## 2015-12-26 MED ORDER — EMPAGLIFLOZIN 25 MG PO TABS: 25.0000 mg | ORAL_TABLET | Freq: Every day | ORAL | Status: DC

## 2015-12-26 NOTE — Patient Instructions (Signed)
   Please contact your eyecare professional to schedule a routine eye exam  

## 2015-12-26 NOTE — Progress Notes (Signed)
Patient: Taylor [name] Male    DOB: 24-Mar-1956   59 y.o.   MRN: [id number] Visit Date: 12/26/2015  Today's Provider: Mila Merry, MD   Chief Complaint  Patient presents with  . Hypertension    follow up  . Diabetes    follow up  . Hyperlipidemia    follow up   Subjective:    HPI  Hypertension, follow-up:  BP Readings from Last 3 Encounters:  12/26/15 140/86  09/25/15 160/88  06/27/15 190/100    He was last seen for hypertension 3 months ago.  BP at that visit was 160/88. Management since that visit includes continue same medications.   He reports good compliance with treatment. He is not having side effects.  He is exercising. He is not adherent to low salt diet.   Outside blood pressures are not being checked. He is experiencing none.  Patient denies chest pain, chest pressure/discomfort, claudication, dyspnea, exertional chest pressure/discomfort, fatigue, irregular heart beat, lower extremity edema, near-syncope, orthopnea, palpitations, paroxysmal nocturnal dyspnea, syncope and tachypnea.   Cardiovascular risk factors include advanced age (older than 37 for men, 48 for women), diabetes mellitus, dyslipidemia, hypertension, male gender and obesity (BMI >= 30 kg/m2).  Use of agents associated with hypertension: NSAIDS.     Weight trend: fluctuating a bit Wt Readings from Last 3 Encounters:  12/26/15 321 lb (145.605 kg)  09/25/15 325 lb (147.419 kg)  06/27/15 325 lb (147.419 kg)    Current diet: well balanced  ------------------------------------------------------------------------   Diabetes Mellitus Type II, Follow-up:   Lab Results  Component Value Date   HGBA1C 12.0 09/25/2015   HGBA1C 11.0 06/27/2015   HGBA1C 10.3 05/07/2015    Last seen for diabetes 3 months ago.  Management since then includes restarting Farxiga 10mg  (samples given). He reports good compliance with treatment, although he ran out of samples about 5 days ago.  He  is not having side effects.  Current symptoms include polydipsia and have been stable. Home blood sugar records: fasting range: 140-200  Episodes of hypoglycemia? no   Current Insulin Regimen: none Most Recent Eye Exam: 1 year ago Weight trend: fluctuating a bit Prior visit with dietician: no Current diet: well balanced Current exercise: walking on treadmill at the [facility], swimming  Pertinent Labs:    Component Value Date/Time   CHOL 160 01/24/2015 1146   TRIG 127 01/24/2015 1146   HDL 45 01/24/2015 1146   LDLCALC 90 01/24/2015 1146   CREATININE 1.11 01/24/2015 1146   CREATININE 1.1 08/01/2014    Wt Readings from Last 3 Encounters:  12/26/15 321 lb (145.605 kg)  09/25/15 325 lb (147.419 kg)  06/27/15 325 lb (147.419 kg)    ------------------------------------------------------------------------   Lipid/Cholesterol, Follow-up:   Last seen for this3 months ago.  Management changes since that visit include none. . Last Lipid Panel:    Component Value Date/Time   CHOL 160 01/24/2015 1146   TRIG 127 01/24/2015 1146   HDL 45 01/24/2015 1146   CHOLHDL 3.6 01/24/2015 1146   LDLCALC 90 01/24/2015 1146    Risk factors for vascular disease include diabetes mellitus, hypercholesterolemia and hypertension  He reports good compliance with treatment. He is not having side effects.  Current symptoms include none and have been stable. Weight trend: fluctuating a bit Prior visit with dietician: no Current diet: well balanced Current exercise: walking  Wt Readings from Last 3 Encounters:  12/26/15 321 lb (145.605 kg)  09/25/15 325 lb (147.419 kg)  06/27/15 325 lb (147.419 kg)    -------------------------------------------------------------------     No Known Allergies Current Meds  Medication Sig  . aspirin 81 MG tablet Take by mouth.  . dapagliflozin propanediol (FARXIGA) 10 MG TABS tablet Take 10 mg by mouth daily.  Marland Kitchen glipiZIDE (GLUCOTROL XL) 10 MG 24 hr tablet  Take 1 tablet (10 mg total) by mouth daily.  Marland Kitchen HYDROmorphone (DILAUDID) 4 MG tablet Take 4 mg by mouth every 6 (six) hours as needed.   . Ibuprofen-Famotidine 800-26.6 MG TABS Take 1 tablet by mouth 3 (three) times daily as needed.  Marland Kitchen lisinopril (PRINIVIL,ZESTRIL) 10 MG tablet TAKE 1 TABLET ONCE DAILY  . metFORMIN (GLUCOPHAGE) 1000 MG tablet TAKE (1) TABLET TWICE A DAY.  . OXYCODONE HCL Take 40 mg by mouth every 4 (four) hours as needed.   . pregabalin (LYRICA) 75 MG capsule Take by mouth.  . simvastatin (ZOCOR) 40 MG tablet Take 1 tablet (40 mg total) by mouth daily.  . TESTOSTERONE IM   . zolpidem (AMBIEN CR) 12.5 MG CR tablet AMBIEN CR, 12.5MG  (Oral Tablet Extended Release)  1 PO QHS for 0 days  Quantity: 30.00;  Refills: 0   Ordered :15-Jul-2010  Nilda Simmer MD;  Started 03-Mar-2007 Active    Review of Systems  Constitutional: Negative for fever, chills and appetite change.  Respiratory: Negative for chest tightness, shortness of breath and wheezing.   Cardiovascular: Negative for chest pain and palpitations.  Gastrointestinal: Negative for nausea, vomiting and abdominal pain.  Endocrine: Positive for polydipsia.  Musculoskeletal: Positive for back pain.       Pain and burning in feet    Social History  Substance Use Topics  . Smoking status: Never Smoker   . Smokeless tobacco: Not on file  . Alcohol Use: No   Objective:   BP 140/86 mmHg  Pulse 81  Temp(Src) 98.9 F (37.2 C) (Oral)  Resp 18  Wt 321 lb (145.605 kg)  SpO2 97%  Physical Exam  General Appearance:    Alert, cooperative, no distress, obese  Eyes:    PERRL, conjunctiva/corneas clear, EOM's intact       Lungs:     Clear to auscultation bilaterally, respirations unlabored  Heart:    Regular rate and rhythm  Neurologic:   Awake, alert, oriented x 3. No apparent focal neurological           defect.        Results for orders placed or performed in visit on 12/26/15  POCT HgB A1C  Result Value Ref Range    Hemoglobin A1C 10.8    Est. average glucose Bld gHb Est-mCnc 263         Assessment & Plan:     1. Uncontrolled type 2 diabetes mellitus without complication, without long-term current use of insulin (HCC) Improved, but not at goal on Farxiga. Which changed to Jardiance as Marcelline Deist is not on his formulary. He is due for CPE in the next month or two and will check labs at follow up. He is to call for samples if insurance doesn't cover Jardiance.  - POCT HgB A1C - empagliflozin (JARDIANCE) 25 MG TABS tablet; Take 25 mg by mouth daily.  Dispense: 30 tablet; Refill: 3  2. Hypertension, essential, benign Well controlled.  Continue current medications.          Mila Merry, MD  [facility]

## 2016-01-15 NOTE — Telephone Encounter (Signed)
error 

## 2016-03-30 ENCOUNTER — Encounter: Payer: Medicare Other | Admitting: Family Medicine

## 2016-04-15 DIAGNOSIS — E119 Type 2 diabetes mellitus without complications: Secondary | ICD-10-CM | POA: Diagnosis not present

## 2016-04-15 LAB — HM DIABETES EYE EXAM

## 2016-04-17 ENCOUNTER — Other Ambulatory Visit: Payer: Self-pay | Admitting: Family Medicine

## 2016-04-17 DIAGNOSIS — IMO0001 Reserved for inherently not codable concepts without codable children: Secondary | ICD-10-CM

## 2016-04-17 DIAGNOSIS — E1165 Type 2 diabetes mellitus with hyperglycemia: Principal | ICD-10-CM

## 2016-04-17 DIAGNOSIS — I10 Essential (primary) hypertension: Secondary | ICD-10-CM

## 2016-04-20 ENCOUNTER — Encounter: Payer: Self-pay | Admitting: *Deleted

## 2016-04-29 ENCOUNTER — Other Ambulatory Visit: Payer: Self-pay | Admitting: Family Medicine

## 2016-04-29 DIAGNOSIS — E1165 Type 2 diabetes mellitus with hyperglycemia: Principal | ICD-10-CM

## 2016-04-29 DIAGNOSIS — IMO0001 Reserved for inherently not codable concepts without codable children: Secondary | ICD-10-CM

## 2016-06-19 ENCOUNTER — Other Ambulatory Visit: Payer: Self-pay | Admitting: Family Medicine

## 2016-06-19 DIAGNOSIS — I10 Essential (primary) hypertension: Secondary | ICD-10-CM

## 2016-06-19 DIAGNOSIS — IMO0001 Reserved for inherently not codable concepts without codable children: Secondary | ICD-10-CM

## 2016-06-19 DIAGNOSIS — E1165 Type 2 diabetes mellitus with hyperglycemia: Secondary | ICD-10-CM

## 2016-06-29 ENCOUNTER — Other Ambulatory Visit: Payer: Self-pay | Admitting: Family Medicine

## 2016-06-29 DIAGNOSIS — E1165 Type 2 diabetes mellitus with hyperglycemia: Principal | ICD-10-CM

## 2016-06-29 DIAGNOSIS — IMO0001 Reserved for inherently not codable concepts without codable children: Secondary | ICD-10-CM

## 2016-07-16 ENCOUNTER — Other Ambulatory Visit: Payer: Self-pay | Admitting: Family Medicine

## 2016-07-16 DIAGNOSIS — I10 Essential (primary) hypertension: Secondary | ICD-10-CM

## 2016-07-16 DIAGNOSIS — IMO0001 Reserved for inherently not codable concepts without codable children: Secondary | ICD-10-CM

## 2016-07-16 DIAGNOSIS — E1165 Type 2 diabetes mellitus with hyperglycemia: Secondary | ICD-10-CM

## 2016-07-16 NOTE — Telephone Encounter (Signed)
Pharmacy requesting refills. Pharmacy listed is correct. Thanks!  

## 2016-07-30 ENCOUNTER — Ambulatory Visit (INDEPENDENT_AMBULATORY_CARE_PROVIDER_SITE_OTHER): Payer: Medicare Other | Admitting: Family Medicine

## 2016-07-30 ENCOUNTER — Encounter: Payer: Self-pay | Admitting: Family Medicine

## 2016-07-30 VITALS — BP 112/86 | HR 88 | Temp 99.0°F | Resp 16 | Ht 73.0 in | Wt 319.0 lb

## 2016-07-30 DIAGNOSIS — I10 Essential (primary) hypertension: Secondary | ICD-10-CM | POA: Diagnosis not present

## 2016-07-30 DIAGNOSIS — Z125 Encounter for screening for malignant neoplasm of prostate: Secondary | ICD-10-CM | POA: Diagnosis not present

## 2016-07-30 DIAGNOSIS — Z1159 Encounter for screening for other viral diseases: Secondary | ICD-10-CM

## 2016-07-30 DIAGNOSIS — Z0000 Encounter for general adult medical examination without abnormal findings: Secondary | ICD-10-CM

## 2016-07-30 DIAGNOSIS — E1165 Type 2 diabetes mellitus with hyperglycemia: Secondary | ICD-10-CM | POA: Diagnosis not present

## 2016-07-30 DIAGNOSIS — IMO0001 Reserved for inherently not codable concepts without codable children: Secondary | ICD-10-CM

## 2016-07-30 DIAGNOSIS — E7800 Pure hypercholesterolemia, unspecified: Secondary | ICD-10-CM | POA: Diagnosis not present

## 2016-07-30 DIAGNOSIS — E291 Testicular hypofunction: Secondary | ICD-10-CM

## 2016-07-30 NOTE — Progress Notes (Signed)
Patient: Taylor [name], Male    DOB: [birth year], 61 y.o.   MRN: [id number] Visit Date: 07/30/2016  Today's Provider: Lelon Huh, MD   Chief Complaint  Patient presents with  . Annual Physical  . Hyperlipidemia    follow up  . Diabetes    follow up  . Hypertension    follow up   Subjective:    Annual wellness visit Taylor [name] is a 61 y.o. male. He feels fairly well. He reports never exercising. He reports he is sleeping poorly.  -----------------------------------------------------------  Diabetes Mellitus Type II, Follow-up:   Lab Results  Component Value Date   HGBA1C 10.8 12/26/2015   HGBA1C 12.0 09/25/2015   HGBA1C 11.0 06/27/2015   Last seen for diabetes 7 months ago.  Management since then includes starting Jardiance 101m daily. He reports poor compliance with treatment. Patient stopped taking Jardiance 1 month ago due to the cost. He is not having side effects.  Current symptoms include none and have been stable. Home blood sugar records: fasting range: 200-325 since running out of Jardiance, was in the low to mid 100s before he ran out.   Episodes of hypoglycemia? no   Current Insulin Regimen: none Most Recent Eye Exam: 3 months ago Weight trend: fluctuating a bit Prior visit with dietician: no Current diet: well balanced Current exercise: none  ------------------------------------------------------------------------   Hypertension, follow-up:  BP Readings from Last 3 Encounters:  12/26/15 140/86  09/25/15 (!) 160/88  06/27/15 (!) 190/100    He was last seen for hypertension 7 months ago.  BP at that visit was 140/86. Management since that visit includes no changes.He reports good compliance with treatment. He is not having side effects.  He is not exercising. He is adherent to low salt diet.   Outside blood pressures are not being checked. He is experiencing none.  Patient denies chest pain, chest pressure/discomfort,  claudication, dyspnea, exertional chest pressure/discomfort, irregular heart beat, lower extremity edema, near-syncope, orthopnea, palpitations, paroxysmal nocturnal dyspnea, syncope and tachypnea.   Cardiovascular risk factors include advanced age (older than 559for men, 668for women), diabetes mellitus, dyslipidemia, hypertension and male gender.  Use of agents associated with hypertension: NSAIDS.   ------------------------------------------------------------------------    Lipid/Cholesterol, Follow-up:   Last seen for this 10 months ago.  Management since that visit includes no changes.  Last Lipid Panel:    Component Value Date/Time   CHOL 160 01/24/2015 1146   TRIG 127 01/24/2015 1146   HDL 45 01/24/2015 1146   CHOLHDL 3.6 01/24/2015 1146   LDLCALC 90 01/24/2015 1146    He reports good compliance with treatment. He is not having side effects.   Wt Readings from Last 3 Encounters:  12/26/15 (!) 321 lb (145.6 kg)  09/25/15 (!) 325 lb (147.4 kg)  06/27/15 (!) 325 lb (147.4 kg)    ------------------------------------------------------------------------   Review of Systems  Constitutional: Positive for diaphoresis and fatigue. Negative for appetite change, chills and fever.  HENT: Positive for hearing loss and tinnitus. Negative for congestion, ear pain, nosebleeds and trouble swallowing.   Eyes: Positive for photophobia. Negative for pain and visual disturbance.  Respiratory: Positive for shortness of breath. Negative for cough and chest tightness.   Cardiovascular: Negative for chest pain, palpitations and leg swelling.  Gastrointestinal: Positive for abdominal distention and constipation. Negative for abdominal pain, blood in stool, diarrhea, nausea and vomiting.  Endocrine: Positive for heat intolerance and polydipsia. Negative for polyphagia and polyuria.  Genitourinary: Positive for frequency. Negative for dysuria and flank pain.  Musculoskeletal: Positive for back  pain and myalgias. Negative for arthralgias, joint swelling and neck stiffness.  Skin: Negative for color change, rash and wound.  Neurological: Positive for weakness and light-headedness. Negative for dizziness, tremors, seizures, speech difficulty and headaches.  Psychiatric/Behavioral: Positive for decreased concentration. Negative for behavioral problems, confusion, dysphoric mood and sleep disturbance. The patient is not nervous/anxious.   All other systems reviewed and are negative.   Social History   Social History  . Marital status: Single    Spouse name: N/A  . Number of children: 1  . Years of education: N/A   Occupational History  . Not on file.   Social History Main Topics  . Smoking status: Never Smoker  . Smokeless tobacco: Never Used  . Alcohol use No  . Drug use: No  . Sexual activity: Not on file   Other Topics Concern  . Not on file   Social History Narrative  . No narrative on file    Past Medical History:  Diagnosis Date  . Diabetes mellitus without complication ([place])   . Hypertension      Patient Active Problem List   Diagnosis Date Noted  . Fatty liver 05/07/2015  . Back ache 11/15/2014  . Chronic low back pain 11/15/2014  . Dyspnea on exertion 11/15/2014  . Erectile dysfunction 11/15/2014  . Hypogonadism, male 11/15/2014  . Cervical pain 11/15/2014  . Nocturia 11/15/2014  . Disturbance of skin sensation 11/15/2014  . Seborrheic keratosis 11/15/2014  . Thoracic back pain 11/15/2014  . Hepatomegaly 03/27/2013  . Generalized hyperhidrosis 10/18/2009  . Depressive disorder 03/27/2008  . Premature beats 06/04/2007  . Chronic pain syndrome 03/03/2007  . Diabetes mellitus type 2, uncontrolled ([place]) 03/03/2007  . Hypercholesteremia 03/03/2007  . Hypertension, essential, benign 03/03/2007  . Obesity 03/03/2007    Past Surgical History:  Procedure Laterality Date  . BACK SURGERY      His family history includes Diabetes in his mother and  sister.      Current Outpatient Prescriptions:  .  aspirin 81 MG tablet, Take by mouth., Disp: , Rfl:  .  glipiZIDE (GLUCOTROL XL) 10 MG 24 hr tablet, TAKE 1 TABLET BY MOUTH ONCE DAILY., Disp: 30 tablet, Rfl: 0 .  HYDROmorphone (DILAUDID) 4 MG tablet, Take 4 mg by mouth every 6 (six) hours as needed. , Disp: , Rfl:  .  Ibuprofen-Famotidine 800-26.6 MG TABS, Take 1 tablet by mouth 3 (three) times daily as needed., Disp: , Rfl:  .  lisinopril (PRINIVIL,ZESTRIL) 10 MG tablet, TAKE 1 TABLET BY MOUTH ONCE DAILY., Disp: 30 tablet, Rfl: 15 .  metFORMIN (GLUCOPHAGE) 1000 MG tablet, TAKE 1 TABLET BY MOUTH TWICE DAILY, Disp: 30 tablet, Rfl: 0 .  OXYCODONE HCL, Take 40 mg by mouth every 4 (four) hours as needed. , Disp: , Rfl:  .  pregabalin (LYRICA) 75 MG capsule, Take by mouth., Disp: , Rfl:  .  simvastatin (ZOCOR) 40 MG tablet, Take 1 tablet (40 mg total) by mouth daily., Disp: 90 tablet, Rfl: 4 .  TESTOSTERONE IM, , Disp: , Rfl:  .  zolpidem (AMBIEN CR) 12.5 MG CR tablet, AMBIEN CR, 12.5MG (Oral Tablet Extended Release)  1 PO QHS for 0 days  Quantity: 30.00;  Refills: 0   Ordered :15-Jul-2010  Reginia Forts MD;  Started 03-Mar-2007 Active, Disp: , Rfl:  .  JARDIANCE 25 MG TABS tablet, TAKE 1 TABLET BY MOUTH ONCE DAILY. (Patient  not taking: Reported on 07/30/2016), Disp: 30 tablet, Rfl: 0  Patient Care Team: Birdie Sons, MD as PCP - General (Family Medicine) Judi Cong, MD as Consulting Physician (Endocrinology)     Objective:   Vitals: BP 112/86 (BP Location: Left Arm, Patient Position: Sitting, Cuff Size: Large)   Pulse 88   Temp 99 F (37.2 C) (Oral)   Resp 16   Ht _0  (1.854 m)   Wt (!) 319 lb (144.7 kg)   SpO2 97% Comment: room air  BMI 42.09 kg/m   Physical Exam   General Appearance:    Alert, cooperative, no distress, appears stated age  Head:    Normocephalic, without obvious abnormality, atraumatic  Eyes:    PERRL, conjunctiva/corneas clear, EOM's intact, fundi     benign, both eyes       Ears:    Normal TM's and external ear canals, both ears  Nose:   Nares normal, septum midline, mucosa normal, no drainage   or sinus tenderness  Throat:   Lips, mucosa, and tongue normal; teeth and gums normal  Neck:   Supple, symmetrical, trachea midline, no adenopathy;       thyroid:  No enlargement/tenderness/nodules; no carotid   bruit or JVD  Back:     Symmetric, no curvature, ROM normal, no CVA tenderness  Lungs:     Clear to auscultation bilaterally, respirations unlabored  Chest wall:    No tenderness or deformity  Heart:    Regular rate and rhythm, S1 and S2 normal, no murmur, rub   or gallop  Abdomen:     Soft, non-tender, bowel sounds active all four quadrants,    no masses, no organomegaly  Genitalia:    deferred  Rectal:    deferred  Extremities:   Extremities normal, atraumatic, no cyanosis or edema  Pulses:   2+ and symmetric all extremities  Skin:   Skin color, texture, turgor normal, no rashes or lesions  Lymph nodes:   Cervical, supraclavicular, and axillary nodes normal  Neurologic:   CNII-XII intact. Normal strength, sensation and reflexes      throughout    Activities of Daily Living In your present state of health, do you have any difficulty performing the following activities: 07/30/2016  Hearing? Y  Vision? N  Difficulty concentrating or making decisions? Y  Walking or climbing stairs? Y  Dressing or bathing? N  Doing errands, shopping? N  Some recent data might be hidden    Fall Risk Assessment Fall Risk  07/30/2016  Falls in the past year? No     Depression Screen PHQ 2/9 Scores 07/30/2016 01/24/2015  PHQ - 2 Score 0 4  PHQ- 9 Score - 11    Cognitive Testing - 6-CIT  Correct? Score   What year is it? yes 0 0 or 4  What month is it? yes 0 0 or 3  Memorize:    Pia Mau,  43,  [place],      What time is it? (within 1 hour) yes 0 0 or 3  Count backwards from 20 yes 0 0, 2, or 4  Name the months of the year  yes 0 0, 2, or 4  Repeat name & address above no 4 0, 2, 4, 6, 8, or 10       TOTAL SCORE  4/28   Interpretation:  Normal  Normal (0-7) Abnormal (8-28)   Current Exercise Habits: The patient does not participate in regular  exercise at present Exercise limited by: None identified   Audit-C Alcohol Use Screening  Question Answer Points  How often do you have alcoholic drink? never 0  On days you do drink alcohol, how many drinks do you typically consume? n/a 0  How oftey will you drink 6 or more in a total? never 0  Total Score:  0   A score of 3 or more in women, and 4 or more in men indicates increased risk for alcohol abuse, EXCEPT if all of the points are from question 1.     Assessment & Plan:     Annual Physical  Reviewed patient's Family Medical History Reviewed and updated list of patient's medical providers Assessment of cognitive impairment was done Assessed patient's functional ability Established a written schedule for health screening [place] Completed and Reviewed  Exercise Activities and Dietary recommendations Goals    None      Immunization History  Administered Date(s) Administered  . Pneumococcal Polysaccharide-23 11/27/2013  . Tdap 11/25/2012    Health Maintenance  Topic Date Due  . Hepatitis C Screening  14-Jul-1955  . HIV Screening  03/27/1972  . COLONOSCOPY  03/28/2007  . FOOT EXAM  11/15/2015  . INFLUENZA VACCINE  01/07/2016  . HEMOGLOBIN A1C  06/27/2016  . OPHTHALMOLOGY EXAM  04/15/2017  . PNEUMOCOCCAL POLYSACCHARIDE VACCINE (2) 11/28/2018  . TETANUS/TDAP  11/26/2022     Discussed health benefits of physical activity, and encouraged him to engage in regular exercise appropriate for his age and condition.    ------------------------------------------------------------------------------------------------------------  1. Annual physical exam Refuses colonoscopy since GI was not able to complete colonoscopy  when attempted several years ago  Given iFOBT. Collection kit  2. Hypertension, essential, benign Well controlled.  Continue current medications.    3. Hypogonadism, male He states he tried testosterone injections in the past, but really disliked getting regular injections. He would be interested in trying topical testosterone replacement. Will check testosterone levels today.   4. Uncontrolled type 2 diabetes mellitus without complication, without long-term current use of insulin (HCC) Currently off of Jardiance due to cost. He plans on getting prescription filled   5. Hypercholesteremia He is tolerating simvastatin well with no adverse effects.  Check lipids.   6. Need for hepatitis C screening test Hep C antibodies.   7. Prostate cancer screening -PSA   The entirety of the information documented in the History of Present Illness, Review of Systems and Physical Exam were personally obtained by me. Portions of this information were initially documented by Meyer Cory, CMA and reviewed by me for thoroughness and accuracy.    Lelon Huh, MD  [facility]

## 2016-07-31 ENCOUNTER — Telehealth: Payer: Self-pay

## 2016-07-31 DIAGNOSIS — R7989 Other specified abnormal findings of blood chemistry: Secondary | ICD-10-CM

## 2016-07-31 LAB — LIPID PANEL
CHOL/HDL RATIO: 3.7 ratio (ref 0.0–5.0)
Cholesterol, Total: 174 mg/dL (ref 100–199)
HDL: 47 mg/dL (ref >39)
LDL CALC: 88 mg/dL (ref 0–99)
TRIGLYCERIDES: 197 mg/dL — AB (ref 0–149)
VLDL Cholesterol Cal: 39 mg/dL (ref 5–40)

## 2016-07-31 LAB — COMPREHENSIVE METABOLIC PANEL
A/G RATIO: 1.5 (ref 1.2–2.2)
ALK PHOS: 95 IU/L (ref 39–117)
ALT: 30 IU/L (ref 0–44)
AST: 25 IU/L (ref 0–40)
Albumin: 4.3 g/dL (ref 3.5–5.5)
BUN/Creatinine Ratio: 12 (ref 9–20)
BUN: 13 mg/dL (ref 6–24)
Bilirubin Total: 0.3 mg/dL (ref 0.0–1.2)
CO2: 25 mmol/L (ref 18–29)
Calcium: 9.2 mg/dL (ref 8.7–10.2)
Chloride: 94 mmol/L — ABNORMAL LOW (ref 96–106)
Creatinine, Ser: 1.07 mg/dL (ref 0.76–1.27)
GFR calc Af Amer: 87 mL/min/{1.73_m2} (ref >59)
GFR calc non Af Amer: 76 mL/min/{1.73_m2} (ref >59)
GLOBULIN, TOTAL: 2.9 g/dL (ref 1.5–4.5)
Glucose: 229 mg/dL — ABNORMAL HIGH (ref 65–99)
POTASSIUM: 4.8 mmol/L (ref 3.5–5.2)
Sodium: 134 mmol/L (ref 134–144)
Total Protein: 7.2 g/dL (ref 6.0–8.5)

## 2016-07-31 LAB — HEMOGLOBIN A1C
Est. average glucose Bld gHb Est-mCnc: 269 mg/dL
HEMOGLOBIN A1C: 11 % — AB (ref 4.8–5.6)

## 2016-07-31 LAB — PSA: Prostate Specific Ag, Serum: 0.4 ng/mL (ref 0.0–4.0)

## 2016-07-31 LAB — TESTOSTERONE,FREE AND TOTAL
Testosterone, Free: 1.8 pg/mL — ABNORMAL LOW (ref 7.2–24.0)
Testosterone: 12 ng/dL — ABNORMAL LOW (ref 264–916)

## 2016-07-31 LAB — HEPATITIS C ANTIBODY

## 2016-07-31 NOTE — Telephone Encounter (Signed)
Advised patient as below. Order has been placed to recheck levels. Patient will come by next week to have labs drawn.

## 2016-07-31 NOTE — Telephone Encounter (Signed)
Left message to call back  

## 2016-07-31 NOTE — Telephone Encounter (Signed)
-----   Message from Malva Limes, MD sent at 07/31/2016  8:13 AM EST ----- Testosterone levels are extremely low and would probable benefit from using testosterone cream. Insurance requires that we check testosterone twice before they will cover testosterone cream. Need to come in sometime next week to check total and free testosterone. A1c is high at 11. Go ahead and start Jardiance. Will recheck a1c in about 3 months. PSA, liver and kidney functions are all normal.

## 2016-08-18 ENCOUNTER — Other Ambulatory Visit: Payer: Self-pay | Admitting: Family Medicine

## 2016-08-18 DIAGNOSIS — IMO0001 Reserved for inherently not codable concepts without codable children: Secondary | ICD-10-CM

## 2016-08-18 DIAGNOSIS — E1165 Type 2 diabetes mellitus with hyperglycemia: Principal | ICD-10-CM

## 2016-08-21 ENCOUNTER — Other Ambulatory Visit (INDEPENDENT_AMBULATORY_CARE_PROVIDER_SITE_OTHER): Payer: Medicare Other

## 2016-08-21 DIAGNOSIS — Z1211 Encounter for screening for malignant neoplasm of colon: Secondary | ICD-10-CM

## 2016-08-21 DIAGNOSIS — E291 Testicular hypofunction: Secondary | ICD-10-CM | POA: Diagnosis not present

## 2016-08-21 LAB — IFOBT (OCCULT BLOOD): IFOBT: NEGATIVE

## 2016-08-23 LAB — TESTOSTERONE,FREE AND TOTAL
TESTOSTERONE FREE: 2 pg/mL — AB (ref 7.2–24.0)
TESTOSTERONE: 27 ng/dL — AB (ref 264–916)

## 2016-08-24 ENCOUNTER — Other Ambulatory Visit: Payer: Self-pay | Admitting: Family Medicine

## 2016-08-24 ENCOUNTER — Telehealth: Payer: Self-pay | Admitting: *Deleted

## 2016-08-24 MED ORDER — TESTOSTERONE 20.25 MG/1.25GM (1.62%) TD GEL: TRANSDERMAL | 2 refills | Status: DC

## 2016-08-24 NOTE — Telephone Encounter (Signed)
-----   Message from Malva Limes, MD sent at 08/24/2016  8:05 AM EDT ----- Very low testosterone level. Please call in Androgel 1.6% 2 gel pumps daily, 75 grams, 2 refills. Follow up office visit 1 month. Call if any trouble filling prescription

## 2016-08-24 NOTE — Telephone Encounter (Signed)
Patient was notified of results. Expressed understanding. Rx called into pharmacy.

## 2016-08-28 ENCOUNTER — Other Ambulatory Visit: Payer: Self-pay | Admitting: Family Medicine

## 2016-08-28 DIAGNOSIS — IMO0001 Reserved for inherently not codable concepts without codable children: Secondary | ICD-10-CM

## 2016-08-28 DIAGNOSIS — E1165 Type 2 diabetes mellitus with hyperglycemia: Principal | ICD-10-CM

## 2016-09-28 ENCOUNTER — Other Ambulatory Visit: Payer: Self-pay | Admitting: Family Medicine

## 2016-09-28 DIAGNOSIS — E1165 Type 2 diabetes mellitus with hyperglycemia: Principal | ICD-10-CM

## 2016-09-28 DIAGNOSIS — IMO0001 Reserved for inherently not codable concepts without codable children: Secondary | ICD-10-CM

## 2016-10-08 ENCOUNTER — Other Ambulatory Visit: Payer: Self-pay | Admitting: Family Medicine

## 2016-12-23 ENCOUNTER — Encounter: Payer: Self-pay | Admitting: Family Medicine

## 2016-12-23 ENCOUNTER — Ambulatory Visit (INDEPENDENT_AMBULATORY_CARE_PROVIDER_SITE_OTHER): Payer: Medicare Other | Admitting: Family Medicine

## 2016-12-23 VITALS — BP 134/84 | HR 92 | Temp 98.1°F | Resp 16 | Wt 311.0 lb

## 2016-12-23 DIAGNOSIS — E291 Testicular hypofunction: Secondary | ICD-10-CM

## 2016-12-23 DIAGNOSIS — E1165 Type 2 diabetes mellitus with hyperglycemia: Secondary | ICD-10-CM | POA: Diagnosis not present

## 2016-12-23 DIAGNOSIS — IMO0001 Reserved for inherently not codable concepts without codable children: Secondary | ICD-10-CM

## 2016-12-23 DIAGNOSIS — I10 Essential (primary) hypertension: Secondary | ICD-10-CM | POA: Diagnosis not present

## 2016-12-23 DIAGNOSIS — E7800 Pure hypercholesterolemia, unspecified: Secondary | ICD-10-CM | POA: Diagnosis not present

## 2016-12-23 LAB — POCT GLYCOSYLATED HEMOGLOBIN (HGB A1C)
Est. average glucose Bld gHb Est-mCnc: 258
Hemoglobin A1C: 10.6

## 2016-12-23 MED ORDER — CANAGLIFLOZIN 300 MG PO TABS: 300.0000 mg | ORAL_TABLET | Freq: Every day | ORAL | 5 refills | Status: DC

## 2016-12-23 MED ORDER — TESTOSTERONE 4 MG/24HR TD PT24: 1.0000 | MEDICATED_PATCH | Freq: Every day | TRANSDERMAL | 2 refills | Status: DC

## 2016-12-23 NOTE — Progress Notes (Signed)
Patient: Taylor [name] Male    DOB: 04/13/1956   61 y.o.   MRN: [id number] Visit Date: 12/23/2016  Today's Provider: Mila Merry, MD   Chief Complaint  Patient presents with  . Follow-up  . Diabetes  . Hypertension  . Hypogonadism   Subjective:    HPI   Diabetes Mellitus Type II, Follow-up:   Lab Results  Component Value Date   HGBA1C 11.0 (H) 07/30/2016   HGBA1C 10.8 12/26/2015   HGBA1C 12.0 09/25/2015   Last seen for diabetes 5 months ago.  Management since then includes; started Jardiance which he was tolerating and noticed sugars dropped into the mid 100s, but he ran out of medication about a month ago and has not been able to refill due to expense  He is not having side effects.    Episodes of hypoglycemia? no   Wt Readings from Last 3 Encounters:  12/23/16 (!) 311 lb (141.1 kg)  07/30/16 (!) 319 lb (144.7 kg)  12/26/15 (!) 321 lb (145.6 kg)      ----------------------------------------------------------------   Hypertension, follow-up:  BP Readings from Last 3 Encounters:  07/30/16 112/86  12/26/15 140/86  09/25/15 (!) 160/88    He was last seen for hypertension 5 months ago.  BP at that visit was 112/86. Management since that visit includes; no changes.He reports good compliance with treatment. He is not having side effects.  He is exercising. He is adherent to low salt diet.     ----------------------------------------------------------------  Hypogonadism, male From 07/30/2016-labs checked. Started androgel 1.62% 2 pumps qd due to low testosterone levels.He felt a bit more energetic on Androgel, but ran out about a month ago.    No Known Allergies   Current Outpatient Prescriptions:  .  aspirin 81 MG tablet, Take by mouth., Disp: , Rfl:  .  glipiZIDE (GLUCOTROL XL) 10 MG 24 hr tablet, TAKE 1 TABLET BY MOUTH ONCE DAILY., Disp: 30 tablet, Rfl: 1 .  HYDROmorphone (DILAUDID) 4 MG tablet, Take 4 mg by mouth every 6 (six)  hours as needed. , Disp: , Rfl:  .  Ibuprofen-Famotidine 800-26.6 MG TABS, Take 1 tablet by mouth 3 (three) times daily as needed., Disp: , Rfl:  .  JARDIANCE 25 MG TABS tablet, TAKE 1 TABLET BY MOUTH ONCE DAILY., Disp: 30 tablet, Rfl: 5 .  lisinopril (PRINIVIL,ZESTRIL) 10 MG tablet, TAKE 1 TABLET BY MOUTH ONCE DAILY., Disp: 30 tablet, Rfl: 15 .  metFORMIN (GLUCOPHAGE) 1000 MG tablet, TAKE 1 TABLET BY MOUTH TWICE DAILY, Disp: 60 tablet, Rfl: 12 .  OXYCODONE HCL, Take 40 mg by mouth every 4 (four) hours as needed. , Disp: , Rfl:  .  pregabalin (LYRICA) 75 MG capsule, Take by mouth., Disp: , Rfl:  .  simvastatin (ZOCOR) 40 MG tablet, TAKE 1 TABLET BY MOUTH ONCE DAILY., Disp: 90 tablet, Rfl: 0 .  Testosterone 20.25 MG/1.25GM (1.62%) GEL, Apply 2 pumps to skin daily, Disp: 75 g, Rfl: 2 .  zolpidem (AMBIEN CR) 12.5 MG CR tablet, AMBIEN CR, 12.5MG  (Oral Tablet Extended Release)  1 PO QHS for 0 days  Quantity: 30.00;  Refills: 0   Ordered :15-Jul-2010  Nilda Simmer MD;  Started 03-Mar-2007 Active, Disp: , Rfl:   Review of Systems  Constitutional: Negative for appetite change, chills and fever.  Respiratory: Negative for chest tightness, shortness of breath and wheezing.   Cardiovascular: Negative for chest pain and palpitations.  Gastrointestinal: Negative for abdominal pain, nausea and vomiting.  Social History  Substance Use Topics  . Smoking status: Never Smoker  . Smokeless tobacco: Never Used  . Alcohol use No   Objective:   BP 134/84 (BP Location: Right Arm, Patient Position: Sitting, Cuff Size: Large)   Pulse 92   Temp 98.1 F (36.7 C) (Oral)   Resp 16   Wt (!) 311 lb (141.1 kg)   SpO2 96%   BMI 41.03 kg/m     Physical Exam   General Appearance:    Alert, cooperative, no distress, obese  Eyes:    PERRL, conjunctiva/corneas clear, EOM's intact       Lungs:     Clear to auscultation bilaterally, respirations unlabored  Heart:    Regular rate and rhythm  Neurologic:    Awake, alert, oriented x 3. No apparent focal neurological           defect.       Results for orders placed or performed in visit on 12/23/16  POCT glycosylated hemoglobin (Hb A1C)  Result Value Ref Range   Hemoglobin A1C 10.6    Est. average glucose Bld gHb Est-mCnc 258        Assessment & Plan:     1. Uncontrolled type 2 diabetes mellitus without complication, without long-term current use of insulin (HCC) Counseled on numerous benefits of controlling blood sugar and of SGLT-2 inhibitor for reducing cardiovascular events. Unfortunately his insurance does not cover Jardiance, but Invokana is covered. Start Invokana 300mg  daily and he is to call if any difficulties filling prescription   - POCT glycosylated hemoglobin (Hb A1C)  2. Hypogonadism, male Unable to afford topical testosterone at this time.   3. Hypertension, essential, benign Stable, Continue current medications.    4. Hypercholesteremia He is tolerating simvastatin well with no adverse effects.    Return in about 3 months (around 03/25/2017).        Mila Merry, MD  [facility]

## 2016-12-23 NOTE — Patient Instructions (Addendum)
   Start taking 1,000mg  B12 every day.   Take samples Invokana 100mg  once a day for 10 days, then start prescription300mg  Invokana every day

## 2017-01-04 ENCOUNTER — Other Ambulatory Visit: Payer: Self-pay | Admitting: Family Medicine

## 2017-02-01 ENCOUNTER — Other Ambulatory Visit: Payer: Self-pay | Admitting: Family Medicine

## 2017-02-01 DIAGNOSIS — IMO0001 Reserved for inherently not codable concepts without codable children: Secondary | ICD-10-CM

## 2017-02-01 DIAGNOSIS — E1165 Type 2 diabetes mellitus with hyperglycemia: Principal | ICD-10-CM

## 2017-02-01 MED ORDER — CANAGLIFLOZIN 300 MG PO TABS: 300.0000 mg | ORAL_TABLET | Freq: Every day | ORAL | 11 refills | Status: DC

## 2017-02-02 ENCOUNTER — Telehealth: Payer: Self-pay

## 2017-02-02 NOTE — Telephone Encounter (Signed)
Left message on vm notifying pt samples are ready to pick-up.

## 2017-02-02 NOTE — Telephone Encounter (Signed)
He can have four boxes of Invokana 300mg  tablets.

## 2017-02-02 NOTE — Telephone Encounter (Signed)
Please advise 

## 2017-02-02 NOTE — Telephone Encounter (Signed)
Patient requesting samples of INVOKANA. CB# [phone]

## 2017-03-08 ENCOUNTER — Telehealth: Payer: Self-pay | Admitting: Family Medicine

## 2017-03-08 NOTE — Telephone Encounter (Signed)
Left detailed message on vm notifying pt samples are ready to pick-up.

## 2017-03-08 NOTE — Telephone Encounter (Signed)
Pt is requesting samples for the canagliflozin (INVOKANA) 300 MG TABS tablet.     SW#[phone]

## 2017-03-08 NOTE — Telephone Encounter (Signed)
Please advise 

## 2017-03-08 NOTE — Telephone Encounter (Signed)
Can have four bottles of Invokana 300mg 

## 2017-03-31 ENCOUNTER — Encounter: Payer: Self-pay | Admitting: Family Medicine

## 2017-03-31 ENCOUNTER — Ambulatory Visit (INDEPENDENT_AMBULATORY_CARE_PROVIDER_SITE_OTHER): Payer: Medicare Other | Admitting: Family Medicine

## 2017-03-31 VITALS — BP 142/92 | HR 84 | Temp 97.8°F | Resp 16 | Wt 305.0 lb

## 2017-03-31 DIAGNOSIS — E7800 Pure hypercholesterolemia, unspecified: Secondary | ICD-10-CM | POA: Diagnosis not present

## 2017-03-31 DIAGNOSIS — K760 Fatty (change of) liver, not elsewhere classified: Secondary | ICD-10-CM | POA: Diagnosis not present

## 2017-03-31 DIAGNOSIS — I10 Essential (primary) hypertension: Secondary | ICD-10-CM

## 2017-03-31 DIAGNOSIS — E1165 Type 2 diabetes mellitus with hyperglycemia: Secondary | ICD-10-CM | POA: Diagnosis not present

## 2017-03-31 LAB — POCT UA - MICROALBUMIN: MICROALBUMIN (UR) POC: 20 mg/L

## 2017-03-31 LAB — POCT GLYCOSYLATED HEMOGLOBIN (HGB A1C)
Est. average glucose Bld gHb Est-mCnc: 214
Hemoglobin A1C: 9.1

## 2017-03-31 NOTE — Progress Notes (Signed)
Patient: Taylor [name] Male    DOB: [birth year]   61 y.o.   MRN: [id number] Visit Date: 03/31/2017  Today's Provider: Mila Merry, MD   Chief Complaint  Patient presents with  . Hypertension    follow up  . Diabetes    follow up  . Hyperlipidemia    follow up   Subjective:    HPI   Diabetes Mellitus Type II, Follow-up:   Lab Results  Component Value Date   HGBA1C 10.6 12/23/2016   HGBA1C 11.0 (H) 07/30/2016   HGBA1C 10.8 12/26/2015   Last seen for diabetes 3 months ago.  Management since then includes; started Invokana 300 mg qd. He reports good compliance with treatment. He is not having side effects.  Current symptoms include paresthesia of the feet and have been stable. Home blood sugar records: fasting range: 150 average  Episodes of hypoglycemia? no   Current Insulin Regimen: none Most Recent Eye Exam: <1 year ago Weight trend: decreasing steadily Prior visit with dietician: no Current diet: well balanced Current exercise: none  ------------------------------------------------------------------------   Hypertension, follow-up:  BP Readings from Last 3 Encounters:  12/23/16 134/84  07/30/16 112/86  12/26/15 140/86    He was last seen for hypertension 3 months ago.  BP at that visit was 134/84. Management since that visit includes; no changes.He reports good compliance with treatment. He is not having side effects.  He is not exercising. He is not adherent to low salt diet.   Outside blood pressures are not being checked. He is experiencing none.  Patient denies chest pain, chest pressure/discomfort, claudication, dyspnea, exertional chest pressure/discomfort, fatigue, irregular heart beat, lower extremity edema, near-syncope, orthopnea, palpitations, paroxysmal nocturnal dyspnea, syncope and tachypnea.   Cardiovascular risk factors include advanced age (older than 28 for men, 64 for women), diabetes mellitus, dyslipidemia and  hypertension.  Use of agents associated with hypertension: NSAIDS.   ------------------------------------------------------------------------    Lipid/Cholesterol, Follow-up:   Last seen for this 3 months ago.  Management since that visit includes; no changes.  Last Lipid Panel:    Component Value Date/Time   CHOL 174 07/30/2016 1123   TRIG 197 (H) 07/30/2016 1123   HDL 47 07/30/2016 1123   CHOLHDL 3.7 07/30/2016 1123   LDLCALC 88 07/30/2016 1123    He reports good compliance with treatment. He is not having side effects.   Wt Readings from Last 3 Encounters:  12/23/16 (!) 311 lb (141.1 kg)  07/30/16 (!) 319 lb (144.7 kg)  12/26/15 (!) 321 lb (145.6 kg)    ------------------------------------------------------------------------    No Known Allergies   Current Outpatient Prescriptions:  .  aspirin 81 MG tablet, Take by mouth., Disp: , Rfl:  .  canagliflozin (INVOKANA) 300 MG TABS tablet, Take 1 tablet (300 mg total) by mouth daily before breakfast., Disp: 30 tablet, Rfl: 11 .  cyanocobalamin 100 MCG tablet, Take 100 mcg by mouth daily., Disp: , Rfl:  .  glipiZIDE (GLUCOTROL XL) 10 MG 24 hr tablet, TAKE 1 TABLET BY MOUTH ONCE DAILY., Disp: 30 tablet, Rfl: 12 .  HYDROmorphone (DILAUDID) 4 MG tablet, Take 4 mg by mouth every 6 (six) hours as needed. , Disp: , Rfl:  .  Ibuprofen-Famotidine 800-26.6 MG TABS, Take 1 tablet by mouth 3 (three) times daily as needed., Disp: , Rfl:  .  lisinopril (PRINIVIL,ZESTRIL) 10 MG tablet, TAKE 1 TABLET BY MOUTH ONCE DAILY., Disp: 30 tablet, Rfl: 15 .  metFORMIN (GLUCOPHAGE) 1000 MG  tablet, TAKE 1 TABLET BY MOUTH TWICE DAILY, Disp: 60 tablet, Rfl: 12 .  OXYCODONE HCL, Take 40 mg by mouth every 4 (four) hours as needed. , Disp: , Rfl:  .  pregabalin (LYRICA) 75 MG capsule, Take by mouth., Disp: , Rfl:  .  simvastatin (ZOCOR) 40 MG tablet, TAKE 1 TABLET BY MOUTH ONCE DAILY., Disp: 90 tablet, Rfl: 0 .  zolpidem (AMBIEN CR) 12.5 MG CR tablet,  AMBIEN CR, 12.5MG  (Oral Tablet Extended Release)  1 PO QHS for 0 days  Quantity: 30.00;  Refills: 0   Ordered :15-Jul-2010  Nilda SimmerSmith, Kristi MD;  Started 03-Mar-2007 Active, Disp: , Rfl:   Review of Systems  Constitutional: Negative for appetite change, chills and fever.  Respiratory: Negative for chest tightness, shortness of breath and wheezing.   Cardiovascular: Negative for chest pain and palpitations.  Gastrointestinal: Negative for abdominal pain, nausea and vomiting.  Genitourinary:       Urinary hesitancy  Musculoskeletal: Positive for back pain.  Neurological:       Burning in feet    Social History  Substance Use Topics  . Smoking status: Never Smoker  . Smokeless tobacco: Never Used  . Alcohol use No   Objective:   BP (!) 142/92 (BP Location: Right Arm, Cuff Size: Large)   Pulse 84   Temp 97.8 F (36.6 C) (Oral)   Resp 16   Wt (!) 305 lb (138.3 kg)   SpO2 97% Comment: room air  BMI 40.24 kg/m  There were no vitals filed for this visit.   Physical Exam  General Appearance:    Alert, cooperative, no distress, obese  Eyes:    PERRL, conjunctiva/corneas clear, EOM's intact       Lungs:     Clear to auscultation bilaterally, respirations unlabored  Heart:    Regular rate and rhythm  Neurologic:   Awake, alert, oriented x 3. No apparent focal neurological           defect.        Results for orders placed or performed in visit on 03/31/17  POCT HgB A1C  Result Value Ref Range   Hemoglobin A1C 9.1    Est. average glucose Bld gHb Est-mCnc 214   POCT UA - Microalbumin  Result Value Ref Range   Microalbumin Ur, POC 20 mg/L   Creatinine, POC n/a mg/dL   Albumin/Creatinine Ratio, Urine, POC n/a        Assessment & Plan:     1. Uncontrolled type 2 diabetes mellitus with hyperglycemia (HCC) A1c improving but not to goal. He admits to drinking 2 litre bottle of [employer] every other day and strongly encouraged to cut down to no more than one serving a day. Eliminate  all other sweets and starchy foods from diet. Continue current medications.   Consider adding pioglitazone if not much better at follow up.  - POCT HgB A1C - POCT UA - Microalbumin  2. Hypertension, essential, benign Well controlled.  Continue current medications.    3. Hyperlipidemia.  He is tolerating simvastatin well with no adverse effects.    Return in about 3 months (around 07/01/2017).        Mila Merryonald Fisher, MD  [facility]

## 2017-04-20 ENCOUNTER — Telehealth: Payer: Self-pay | Admitting: Family Medicine

## 2017-04-20 DIAGNOSIS — IMO0001 Reserved for inherently not codable concepts without codable children: Secondary | ICD-10-CM

## 2017-04-20 DIAGNOSIS — E1165 Type 2 diabetes mellitus with hyperglycemia: Principal | ICD-10-CM

## 2017-04-20 NOTE — Telephone Encounter (Signed)
Please advise 

## 2017-04-20 NOTE — Telephone Encounter (Signed)
Samples ready to pick up

## 2017-04-20 NOTE — Telephone Encounter (Signed)
Pt is requesting samples of the Rx canagliflozin (INVOKANA) 300 MG TABS tablet  [phone]

## 2017-04-20 NOTE — Telephone Encounter (Signed)
Pt.notified

## 2017-04-20 NOTE — Telephone Encounter (Signed)
Can have four bottles if we have them.

## 2017-05-13 ENCOUNTER — Telehealth: Payer: Self-pay | Admitting: Family Medicine

## 2017-05-13 NOTE — Telephone Encounter (Signed)
OK, He can have 5 samples bottles. Thanks!

## 2017-05-13 NOTE — Telephone Encounter (Signed)
Pt is requesting to pick up more samples of canagliflozin (INVOKANA) 300 MG TABS tablet. Please advise. Thanks TNP

## 2017-05-13 NOTE — Telephone Encounter (Signed)
We do have samples available.

## 2017-05-13 NOTE — Telephone Encounter (Signed)
Samples placed up front. Patient was advised.

## 2017-06-09 ENCOUNTER — Telehealth: Payer: Self-pay | Admitting: Family Medicine

## 2017-06-09 NOTE — Telephone Encounter (Signed)
Pt is requesting samples of canagliflozin (INVOKANA) 300 MG TABS tablet.He states he is hoping to have insurance by end of Jan

## 2017-06-09 NOTE — Telephone Encounter (Signed)
Please advise 

## 2017-06-09 NOTE — Telephone Encounter (Signed)
Advised patient as below.  

## 2017-06-09 NOTE — Telephone Encounter (Signed)
He can have three bottles if we have them.

## 2017-07-01 ENCOUNTER — Encounter: Payer: Self-pay | Admitting: Family Medicine

## 2017-07-01 ENCOUNTER — Ambulatory Visit (INDEPENDENT_AMBULATORY_CARE_PROVIDER_SITE_OTHER): Payer: Medicare Other | Admitting: Family Medicine

## 2017-07-01 VITALS — BP 140/88 | HR 96 | Temp 98.7°F | Resp 16 | Ht 73.0 in | Wt 313.0 lb

## 2017-07-01 DIAGNOSIS — R200 Anesthesia of skin: Secondary | ICD-10-CM

## 2017-07-01 DIAGNOSIS — E291 Testicular hypofunction: Secondary | ICD-10-CM | POA: Diagnosis not present

## 2017-07-01 DIAGNOSIS — E1165 Type 2 diabetes mellitus with hyperglycemia: Secondary | ICD-10-CM

## 2017-07-01 DIAGNOSIS — R202 Paresthesia of skin: Secondary | ICD-10-CM

## 2017-07-01 DIAGNOSIS — I10 Essential (primary) hypertension: Secondary | ICD-10-CM

## 2017-07-01 LAB — POCT GLYCOSYLATED HEMOGLOBIN (HGB A1C)
Est. average glucose Bld gHb Est-mCnc: 229
HEMOGLOBIN A1C: 9.6

## 2017-07-01 MED ORDER — CANAGLIFLOZIN 300 MG PO TABS: 300.0000 mg | ORAL_TABLET | Freq: Every day | ORAL | 0 refills | Status: DC

## 2017-07-01 MED ORDER — PIOGLITAZONE HCL 30 MG PO TABS: 30.0000 mg | ORAL_TABLET | Freq: Every day | ORAL | 4 refills | Status: DC

## 2017-07-01 MED ORDER — TESTOSTERONE CYPIONATE 200 MG/ML IM SOLN: 100.0000 mg | INTRAMUSCULAR | 1 refills | Status: DC

## 2017-07-01 NOTE — Progress Notes (Signed)
Patient: Taylor [name] Male    DOB: July 09, 1955   62 y.o.   MRN: [id number] Visit Date: 07/01/2017  Today's Provider: Mila Merry, MD   Chief Complaint  Patient presents with  . Follow-up  . Diabetes  . Hypertension  . Hyperlipidemia   Subjective:    HPI   Diabetes Mellitus Type II, Follow-up:   Lab Results  Component Value Date   HGBA1C 9.1 03/31/2017   HGBA1C 10.6 12/23/2016   HGBA1C 11.0 (H) 07/30/2016   Last seen for diabetes 3 months ago.  Management since then includes; recommended decreased soda intake. Eliminate all other sweets and starchy foods from diet. Consider adding pioglitazone if not better at follow-up. He reports good compliance with treatment. He is having side effects. Burning and numbness in feet Current symptoms include none and have been unchanged. Home blood sugar records: fasting range: 150-200  Episodes of hypoglycemia? no   Current Insulin Regimen: n/a Most Recent Eye Exam: 01/2017-[facility]  Weight trend: increasing steadily Prior visit with dietician: no Current diet: in general, an "unhealthy" diet Current exercise: none  ----------------------------------------------------------------   Hypertension, follow-up:  BP Readings from Last 3 Encounters:  07/01/17 140/88  03/31/17 (!) 142/92  12/23/16 134/84    He was last seen for hypertension 3 months ago.  BP at that visit was 142/92. Management since that visit includes; no changes.He reports good compliance with treatment. He is not having side effects. none He is not exercising. He is adherent to low salt diet.   Outside blood pressures are not checking. He is experiencing none.  Patient denies none.   Cardiovascular risk factors include diabetes mellitus.  Use of agents associated with hypertension: none.   ----------------------------------------------------------------   Lipid/Cholesterol, Follow-up:   Last seen for this 3 months ago.  Management  since that visit includes; no changes.  Last Lipid Panel:    Component Value Date/Time   CHOL 174 07/30/2016 1123   TRIG 197 (H) 07/30/2016 1123   HDL 47 07/30/2016 1123   CHOLHDL 3.7 07/30/2016 1123   LDLCALC 88 07/30/2016 1123    He reports good compliance with treatment. He is not having side effects. none  Wt Readings from Last 3 Encounters:  07/01/17 (!) 313 lb (142 kg)  03/31/17 (!) 305 lb (138.3 kg)  12/23/16 (!) 311 lb (141.1 kg)    ----------------------------------------------------------------  Hypogonadism.  States he was feeling better when using testosterone gel, but was too expensive. Previously followed by Dr. Tedd Sias and given himself testosterone injections.    No Known Allergies   Current Outpatient Medications:  .  aspirin 81 MG tablet, Take by mouth., Disp: , Rfl:  .  canagliflozin (INVOKANA) 300 MG TABS tablet, Take 1 tablet (300 mg total) by mouth daily before breakfast., Disp: 30 tablet, Rfl: 11 .  cyanocobalamin 100 MCG tablet, Take 100 mcg by mouth daily., Disp: , Rfl:  .  glipiZIDE (GLUCOTROL XL) 10 MG 24 hr tablet, TAKE 1 TABLET BY MOUTH ONCE DAILY., Disp: 30 tablet, Rfl: 12 .  HYDROmorphone (DILAUDID) 4 MG tablet, Take 4 mg by mouth every 6 (six) hours as needed. , Disp: , Rfl:  .  Ibuprofen-Famotidine 800-26.6 MG TABS, Take 1 tablet by mouth 3 (three) times daily as needed., Disp: , Rfl:  .  lisinopril (PRINIVIL,ZESTRIL) 10 MG tablet, TAKE 1 TABLET BY MOUTH ONCE DAILY., Disp: 30 tablet, Rfl: 15 .  metFORMIN (GLUCOPHAGE) 1000 MG tablet, TAKE 1 TABLET BY MOUTH TWICE  DAILY, Disp: 60 tablet, Rfl: 12 .  OXYCODONE HCL, Take 40 mg by mouth every 4 (four) hours as needed. , Disp: , Rfl:  .  pregabalin (LYRICA) 75 MG capsule, Take by mouth., Disp: , Rfl:  .  simvastatin (ZOCOR) 40 MG tablet, TAKE 1 TABLET BY MOUTH ONCE DAILY., Disp: 90 tablet, Rfl: 0 .  zolpidem (AMBIEN CR) 12.5 MG CR tablet, AMBIEN CR, 12.5MG  (Oral Tablet Extended Release)  1 PO QHS for 0  days  Quantity: 30.00;  Refills: 0   Ordered :15-Jul-2010  Nilda Simmer MD;  Started 03-Mar-2007 Active, Disp: , Rfl:   Review of Systems  Constitutional: Negative for appetite change, chills and fever.  Respiratory: Negative for chest tightness, shortness of breath and wheezing.   Cardiovascular: Negative for chest pain and palpitations.  Gastrointestinal: Negative for abdominal pain, nausea and vomiting.    Social History   Tobacco Use  . Smoking status: Never Smoker  . Smokeless tobacco: Never Used  Substance Use Topics  . Alcohol use: No    Alcohol/week: 0.0 oz   Objective:   BP 140/88 (BP Location: Right Arm, Patient Position: Sitting, Cuff Size: Large)   Pulse 96   Temp 98.7 F (37.1 C) (Oral)   Resp 16   Ht 6\' 1"  (1.854 m)   Wt (!) 313 lb (142 kg)   SpO2 97%   BMI 41.30 kg/m  Vitals:   07/01/17 1127  BP: 140/88  Pulse: 96  Resp: 16  Temp: 98.7 F (37.1 C)  TempSrc: Oral  SpO2: 97%  Weight: (!) 313 lb (142 kg)  Height: 6\' 1"  (1.854 m)     Physical Exam   General Appearance:    Alert, cooperative, no distress  Eyes:    PERRL, conjunctiva/corneas clear, EOM's intact       Lungs:     Clear to auscultation bilaterally, respirations unlabored  Heart:    Regular rate and rhythm  Neurologic:   Awake, alert, oriented x 3. No apparent focal neurological           defect.         Results for orders placed or performed in visit on 07/01/17  POCT glycosylated hemoglobin (Hb A1C)  Result Value Ref Range   Hemoglobin A1C 9.6    Est. average glucose Bld gHb Est-mCnc 229        Assessment & Plan:     1. Uncontrolled type 2 diabetes mellitus with hyperglycemia (HCC) Add pioglitazone - POCT glycosylated hemoglobin (Hb A1C) - pioglitazone (ACTOS) 30 MG tablet; Take 1 tablet (30 mg total) by mouth daily.  Dispense: 30 tablet; Refill: 4  2. Numbness and tingling of foot  - Ambulatory referral to Podiatry  3. Hypertension, essential, benign Well controlled.   Continue current medications.    4. Hypogonadism, male Topical testosterone was too expesive. Will get back on injectable which he has taken in the past.  - testosterone cypionate (DEPOTESTOSTERONE CYPIONATE) 200 MG/ML injection; Inject 0.5 mLs (100 mg total) into the muscle every 14 (fourteen) days.  Dispense: 1 mL; Refill: 1  Return in about 1 month (around 08/01/2017) for testosterone check.       Mila Merry, MD  [facility]

## 2017-07-14 ENCOUNTER — Telehealth: Payer: Self-pay | Admitting: Family Medicine

## 2017-07-14 DIAGNOSIS — E1165 Type 2 diabetes mellitus with hyperglycemia: Secondary | ICD-10-CM

## 2017-07-14 NOTE — Telephone Encounter (Signed)
Pt returned call and was advised that samples are available up front. Pt stated that he would try to get here tomorrow. Thanks TNP

## 2017-07-14 NOTE — Addendum Note (Signed)
Addended by: Marlene Lard on: 07/14/2017 02:03 PM   Modules accepted: Orders

## 2017-07-14 NOTE — Telephone Encounter (Signed)
Yes, he can have 4 sample bottles of Invokana 3100m  He needs to go to labs in 2 weeks for met C and fasting lipids Also, if he has started testosterone injections he needs to go lab in 2 weeks to check free and total testosterone.

## 2017-07-14 NOTE — Telephone Encounter (Signed)
Per Dr. Sherrie Mustache, pt needs to be on testosterone injections for 1 month before we recheck labs.

## 2017-07-14 NOTE — Telephone Encounter (Signed)
Samples are at front desk. LMOVM for pt to return call.

## 2017-07-14 NOTE — Telephone Encounter (Signed)
Please advise 

## 2017-07-14 NOTE — Telephone Encounter (Signed)
Patient stated that he has the testosterone medication but has not started using it yet.

## 2017-07-14 NOTE — Telephone Encounter (Signed)
Pt stated he is out of the samples for canagliflozin (INVOKANA) 300 MG TABS tablet that he got on 07/01/17. Pt is asking if Dr. Sherrie Mustache wants him to continue taking the medication and if so can he pick up samples today if possible b/c he is completely out of the medication. Please advise. Thanks TNP

## 2017-07-20 ENCOUNTER — Other Ambulatory Visit: Payer: Self-pay | Admitting: Family Medicine

## 2017-07-20 ENCOUNTER — Ambulatory Visit: Payer: Self-pay | Admitting: Podiatry

## 2017-07-20 DIAGNOSIS — I10 Essential (primary) hypertension: Secondary | ICD-10-CM

## 2017-07-26 ENCOUNTER — Telehealth: Payer: Self-pay | Admitting: Family Medicine

## 2017-08-02 ENCOUNTER — Telehealth: Payer: Self-pay | Admitting: Family Medicine

## 2017-08-02 NOTE — Telephone Encounter (Signed)
Please advise 

## 2017-08-02 NOTE — Telephone Encounter (Signed)
Patient is requesting samples of Invokana  CB# [phone]

## 2017-08-03 ENCOUNTER — Other Ambulatory Visit: Payer: Self-pay | Admitting: Family Medicine

## 2017-08-03 ENCOUNTER — Ambulatory Visit: Payer: Self-pay | Admitting: Podiatry

## 2017-08-03 NOTE — Telephone Encounter (Signed)
He can have one sample bottle, but he needs to go to lab for met C and fasting lipids. He also needs order for free and total testosterone if he has started injections.

## 2017-08-05 NOTE — Telephone Encounter (Signed)
Patient was advised. Patient stated he still hasn't started the testosterone injection.

## 2017-08-06 NOTE — Telephone Encounter (Signed)
LMOVM for pt to return call 

## 2017-08-06 NOTE — Telephone Encounter (Signed)
Patient stated he will come get labs done next week.

## 2017-08-09 DIAGNOSIS — E1165 Type 2 diabetes mellitus with hyperglycemia: Secondary | ICD-10-CM | POA: Diagnosis not present

## 2017-08-10 LAB — COMPREHENSIVE METABOLIC PANEL
ALBUMIN: 4.2 g/dL (ref 3.6–4.8)
ALK PHOS: 85 IU/L (ref 39–117)
ALT: 18 IU/L (ref 0–44)
AST: 15 IU/L (ref 0–40)
Albumin/Globulin Ratio: 1.4 (ref 1.2–2.2)
BUN / CREAT RATIO: 16 (ref 10–24)
BUN: 15 mg/dL (ref 8–27)
CHLORIDE: 101 mmol/L (ref 96–106)
CO2: 22 mmol/L (ref 20–29)
CREATININE: 0.93 mg/dL (ref 0.76–1.27)
Calcium: 9.6 mg/dL (ref 8.6–10.2)
GFR calc non Af Amer: 89 mL/min/{1.73_m2} (ref >59)
GFR, EST AFRICAN AMERICAN: 103 mL/min/{1.73_m2} (ref >59)
GLOBULIN, TOTAL: 3.1 g/dL (ref 1.5–4.5)
Glucose: 215 mg/dL — ABNORMAL HIGH (ref 65–99)
Potassium: 4.9 mmol/L (ref 3.5–5.2)
SODIUM: 142 mmol/L (ref 134–144)
Total Protein: 7.3 g/dL (ref 6.0–8.5)

## 2017-08-19 ENCOUNTER — Other Ambulatory Visit: Payer: Self-pay | Admitting: Family Medicine

## 2017-08-19 DIAGNOSIS — E1165 Type 2 diabetes mellitus with hyperglycemia: Principal | ICD-10-CM

## 2017-08-19 DIAGNOSIS — IMO0001 Reserved for inherently not codable concepts without codable children: Secondary | ICD-10-CM

## 2017-08-23 NOTE — Telephone Encounter (Signed)
Left message regarding need to schedule initial MWV

## 2017-08-24 ENCOUNTER — Telehealth: Payer: Self-pay | Admitting: Family Medicine

## 2017-08-24 DIAGNOSIS — E1165 Type 2 diabetes mellitus with hyperglycemia: Secondary | ICD-10-CM

## 2017-08-24 MED ORDER — CANAGLIFLOZIN 300 MG PO TABS: 300.0000 mg | ORAL_TABLET | Freq: Every day | ORAL | 0 refills | Status: DC

## 2017-08-24 NOTE — Telephone Encounter (Signed)
Patient would like samples of canagliflozin (INVOKANA) 300 MG TABS tablet if available.  He took his last dose this morning and would like to pick up samples tomorrow while he is in the area if possible.

## 2017-08-24 NOTE — Telephone Encounter (Signed)
Please advise samples? 

## 2017-08-24 NOTE — Telephone Encounter (Signed)
Patient advised. Samples at front desk for pickup.

## 2017-08-24 NOTE — Telephone Encounter (Signed)
He can have 5 sample bottles.

## 2017-09-14 ENCOUNTER — Encounter: Payer: Self-pay | Admitting: Podiatry

## 2017-09-14 ENCOUNTER — Ambulatory Visit: Payer: Medicare Other | Admitting: Podiatry

## 2017-09-14 DIAGNOSIS — E1142 Type 2 diabetes mellitus with diabetic polyneuropathy: Secondary | ICD-10-CM

## 2017-09-14 DIAGNOSIS — E0843 Diabetes mellitus due to underlying condition with diabetic autonomic (poly)neuropathy: Secondary | ICD-10-CM

## 2017-09-14 MED ORDER — NONFORMULARY OR COMPOUNDED ITEM: 2 refills | Status: DC

## 2017-09-15 ENCOUNTER — Ambulatory Visit: Payer: Medicare Other | Admitting: Orthotics

## 2017-09-15 DIAGNOSIS — M21619 Bunion of unspecified foot: Secondary | ICD-10-CM

## 2017-09-15 DIAGNOSIS — E0843 Diabetes mellitus due to underlying condition with diabetic autonomic (poly)neuropathy: Secondary | ICD-10-CM

## 2017-09-15 NOTE — Progress Notes (Signed)
   HPI: 62 year old male with PMHx of T2DM presenting today as a new patient with a chief complaint of tingling, burning sensation to the plantar aspect of bilateral feet that began 4-5 months ago. He reports associated red nodules to the posterior heels bilaterally. He has been taking Lyrica for back problems and is unsure if it is beneficial for his foot pain. He has been using Gold Bond diabetic foot cream with no significant relief. Patient is here for further evaluation and treatment.   Past Medical History:  Diagnosis Date  . Diabetes mellitus without complication (HCC)   . Hypertension       Objective: Physical Exam General: The patient is alert and oriented x3 in no acute distress.  Dermatology: Hyperkeratotic lesion present on the bilateral feet. Pain on palpation with a central nucleated core noted. Skin is cool, dry and supple bilateral lower extremities. Negative for open lesions or macerations.  Vascular: Palpable pedal pulses bilaterally. No edema or erythema noted. Capillary refill within normal limits.  Neurological: Epicritic and protective threshold grossly intact bilaterally.   Musculoskeletal Exam: All pedal and ankle joints range of motion within normal limits bilateral. Muscle strength 5/5 in all groups bilateral. Hammertoe contracture deformity noted to digits 2-5 of the bilateral foot   Assessment: 1. T2DM with neuropathy 2. Hammertoes 2-5 bilateral 3. Pre-ulcerative callus lesions bilateral x 4   Plan of Care:  1. Patient evaluated.  2. Continue taking Lyrica 75 mg TID from PCP for back. 3. Prescription for neuropathy pain cream to be dispensed by [facility] 4. Appointment with Raiford Noble for DM shoes and insoles.  5. Return to clinic as needed.     Felecia Shelling, DPM [facility]  Dr. Felecia Shelling, DPM    [address] [place], [place] [address]                Office [phone]  Fax  [phone]

## 2017-09-15 NOTE — Progress Notes (Signed)
Patient presents today for diabetic shoe measurement and foam casting.  Goals of diabetic shoes/inserts to offer protection from conditions secondary to DM2, offer relief from sheer forces that could lead to ulcerations, protect the foot, and offer greater stability. Patient is under supervision of DPM EVANS Physician managing patients DM2: [facility] Patient has following documented conditions to qualify for diabetic shoes/inserts: PN,  Patient measured with brannock device: 12W

## 2017-09-20 ENCOUNTER — Ambulatory Visit (INDEPENDENT_AMBULATORY_CARE_PROVIDER_SITE_OTHER): Payer: Medicare Other | Admitting: Family Medicine

## 2017-09-20 ENCOUNTER — Encounter: Payer: Self-pay | Admitting: Family Medicine

## 2017-09-20 ENCOUNTER — Other Ambulatory Visit: Payer: Self-pay | Admitting: Family Medicine

## 2017-09-20 VITALS — BP 124/82 | HR 86 | Temp 98.5°F | Resp 16 | Wt 322.0 lb

## 2017-09-20 DIAGNOSIS — E291 Testicular hypofunction: Secondary | ICD-10-CM

## 2017-09-20 DIAGNOSIS — E1165 Type 2 diabetes mellitus with hyperglycemia: Secondary | ICD-10-CM

## 2017-09-20 LAB — POCT GLYCOSYLATED HEMOGLOBIN (HGB A1C)
Est. average glucose Bld gHb Est-mCnc: 220
Hemoglobin A1C: 9.3

## 2017-09-20 MED ORDER — PIOGLITAZONE HCL 45 MG PO TABS: 45.0000 mg | ORAL_TABLET | Freq: Every day | ORAL | 5 refills | Status: DC

## 2017-09-20 MED ORDER — TESTOSTERONE CYPIONATE 200 MG/ML IM SOLN: 100.0000 mg | INTRAMUSCULAR | 1 refills | Status: DC

## 2017-09-20 NOTE — Progress Notes (Signed)
poct      Patient: Taylor [name] Male    DOB: Feb 18, 1956   62 y.o.   MRN: [id number] Visit Date: 09/20/2017  Today's Provider: Mila Merry, MD   Chief Complaint  Patient presents with  . Diabetes    follow up  . Hypogonadism    follow up   Subjective:    HPI   Diabetes Mellitus Type II, Follow-up:   Lab Results  Component Value Date   HGBA1C 9.6 07/01/2017   HGBA1C 9.1 03/31/2017   HGBA1C 10.6 12/23/2016   Last seen for diabetes 3 months ago.  Management since then includes; added pioglitazone 30 mg qd. He reports good compliance with treatment. He is not having side effects.  Current symptoms include paresthesia of the feet and have been unchanged. Home blood sugar records: fasting range: 175-180  Episodes of hypoglycemia? no   Current Insulin Regimen:  none Most Recent Eye Exam: 1 year ago Weight trend: increasing steadily Prior visit with dietician: no Current diet: in general, an "unhealthy" diet Current exercise: none  Is still drinking 3-4 glasses of coca cola every day.  ------------------------------------------------------------------------ Hypogonadism, male From 07/01/2017-started back on injectable testosterone. Patient reports good compliance with treatment, and fair tolerance.Has started back on injections and has been taking 1/2 ml every 2 weeks, is due for injection today.  Lab Results  Component Value Date   TESTOSTERONE 27 (L) 08/21/2016   Follow up hypertension Was last seen for this on 07-01-2017 when BP was 140/88. He is tolerating medications well. No chaanges were made at that time. Denies chest pains or dyspnea.     No Known Allergies   Current Outpatient Medications:  .  aspirin 81 MG chewable tablet, 81 mg once daily., Disp: , Rfl:  .  canagliflozin (INVOKANA) 300 MG TABS tablet, Take 1 tablet (300 mg total) by mouth daily before breakfast., Disp: 25 tablet, Rfl: 0 .  cyanocobalamin 100 MCG tablet, Take 100 mcg by mouth  daily., Disp: , Rfl:  .  glipiZIDE (GLUCOTROL XL) 10 MG 24 hr tablet, TAKE 1 TABLET BY MOUTH ONCE DAILY., Disp: 30 tablet, Rfl: 12 .  HYDROmorphone (DILAUDID) 4 MG tablet, Take 4 mg by mouth every 6 (six) hours as needed. , Disp: , Rfl:  .  Ibuprofen-Famotidine 800-26.6 MG TABS, Take 1 tablet by mouth 3 (three) times daily as needed., Disp: , Rfl:  .  lisinopril (PRINIVIL,ZESTRIL) 10 MG tablet, TAKE 1 TABLET BY MOUTH ONCE DAILY., Disp: 30 tablet, Rfl: 11 .  metFORMIN (GLUCOPHAGE) 1000 MG tablet, TAKE 1 TABLET BY MOUTH TWICE DAILY, Disp: 60 tablet, Rfl: 6 .  NONFORMULARY OR COMPOUNDED ITEM, See pharmacy note, Disp: 120 each, Rfl: 2 .  oxyCODONE (OXYCONTIN) 40 mg 12 hr tablet, 40 mg 2 (two) times daily., Disp: , Rfl:  .  OXYCODONE HCL, Take 40 mg by mouth every 4 (four) hours as needed. , Disp: , Rfl:  .  pioglitazone (ACTOS) 30 MG tablet, Take 1 tablet (30 mg total) by mouth daily., Disp: 30 tablet, Rfl: 4 .  pregabalin (LYRICA) 75 MG capsule, Take by mouth., Disp: , Rfl:  .  testosterone cypionate (DEPOTESTOSTERONE CYPIONATE) 200 MG/ML injection, Inject 0.5 mLs (100 mg total) into the muscle every 14 (fourteen) days., Disp: 1 mL, Rfl: 1 .  zolpidem (AMBIEN CR) 12.5 MG CR tablet, AMBIEN CR, 12.5MG  (Oral Tablet Extended Release)  1 PO QHS for 0 days  Quantity: 30.00;  Refills: 0   Ordered :15-Jul-2010  Nilda Simmer  MD;  Mora Appl 03-Mar-2007 Active, Disp: , Rfl:   Review of Systems  Constitutional: Negative for appetite change, chills and fever.  Respiratory: Negative for chest tightness, shortness of breath and wheezing.   Cardiovascular: Negative for chest pain (left side) and palpitations.  Gastrointestinal: Negative for abdominal pain, nausea and vomiting.  Neurological: Positive for numbness (and tingling in feet).       Burning on feet    Social History   Tobacco Use  . Smoking status: Never Smoker  . Smokeless tobacco: Never Used  Substance Use Topics  . Alcohol use: No     Alcohol/week: 0.0 oz   Objective:   BP 124/82 (BP Location: Left Arm, Patient Position: Sitting, Cuff Size: Large)   Pulse 86   Temp 98.5 F (36.9 C) (Oral)   Resp 16   Wt (!) 322 lb (146.1 kg)   SpO2 96%   BMI 42.48 kg/m     Physical Exam  General Appearance:    Alert, cooperative, no distress, obese  Eyes:    PERRL, conjunctiva/corneas clear, EOM's intact       Lungs:     Clear to auscultation bilaterally, respirations unlabored  Heart:    Regular rate and rhythm  Neurologic:   Awake, alert, oriented x 3. No apparent focal neurological           defect.       Results for orders placed or performed in visit on 09/20/17  POCT HgB A1C  Result Value Ref Range   Hemoglobin A1C 9.3    Est. average glucose Bld gHb Est-mCnc 220        Assessment & Plan:     1. Uncontrolled type 2 diabetes mellitus with hyperglycemia (HCC) Counseled regarding prudent diet and regular exercise.  Will increase pioglitazone, advised may need another medication if a1c is not improving at follow up .  - POCT HgB A1C - Lipid panel - pioglitazone (ACTOS) 45 MG tablet; Take 1 tablet (45 mg total) by mouth daily.  Dispense: 30 tablet; Refill: 5  2. Hypertension Well controlled.  Continue current medications.    3. Hypogonadism, male Is tolerating restarting of testosterone well. He will return in 2 weeks prior to given himself injection that day to check levels.  - Testosterone,Free and Total - CBC - testosterone cypionate (DEPOTESTOSTERONE CYPIONATE) 200 MG/ML injection; Inject 0.5 mLs (100 mg total) into the muscle every 14 (fourteen) days.  Dispense: 1 mL; Refill: 1       Mila Merry, MD  [facility]

## 2017-09-20 NOTE — Patient Instructions (Addendum)
Have your blood drawn to check testosterone level on April 29th before taking your testosterone injection that day.

## 2017-10-12 ENCOUNTER — Telehealth: Payer: Self-pay | Admitting: Podiatry

## 2017-10-12 ENCOUNTER — Telehealth: Payer: Self-pay | Admitting: Family Medicine

## 2017-10-12 DIAGNOSIS — E1165 Type 2 diabetes mellitus with hyperglycemia: Secondary | ICD-10-CM

## 2017-10-12 NOTE — Telephone Encounter (Signed)
Going to be coming to get labs tomorrow am.  He has the lab sheet.  He wants to know when he comes in if we have any samples of  Invokana 300mg  sample if he could get some while he is here.  Pt's call back is [phone]  Thanks Barth Kirks

## 2017-10-12 NOTE — Telephone Encounter (Signed)
Please advise 

## 2017-10-12 NOTE — Telephone Encounter (Signed)
He can have six bottles of Invokana 300

## 2017-10-12 NOTE — Telephone Encounter (Signed)
Pt left voicemail stating returning a call about an appt with rick for inserts. I do not see a note so I was not sure what he was needing an appt for. Please call pt back.

## 2017-10-13 DIAGNOSIS — E291 Testicular hypofunction: Secondary | ICD-10-CM | POA: Diagnosis not present

## 2017-10-13 DIAGNOSIS — E1165 Type 2 diabetes mellitus with hyperglycemia: Secondary | ICD-10-CM | POA: Diagnosis not present

## 2017-10-13 MED ORDER — CANAGLIFLOZIN 300 MG PO TABS: 300.0000 mg | ORAL_TABLET | Freq: Every day | ORAL | 0 refills | Status: DC

## 2017-10-13 NOTE — Telephone Encounter (Signed)
Left detailed message on pt's vm, notifying him samples are ready to pick-up.

## 2017-10-14 ENCOUNTER — Telehealth: Payer: Self-pay | Admitting: *Deleted

## 2017-10-14 DIAGNOSIS — R7989 Other specified abnormal findings of blood chemistry: Secondary | ICD-10-CM

## 2017-10-14 LAB — LIPID PANEL
CHOLESTEROL TOTAL: 216 mg/dL — AB (ref 100–199)
Chol/HDL Ratio: 4.1 ratio (ref 0.0–5.0)
HDL: 53 mg/dL (ref >39)
LDL Calculated: 125 mg/dL — ABNORMAL HIGH (ref 0–99)
Triglycerides: 191 mg/dL — ABNORMAL HIGH (ref 0–149)
VLDL Cholesterol Cal: 38 mg/dL (ref 5–40)

## 2017-10-14 LAB — CBC
Hematocrit: 45.7 % (ref 37.5–51.0)
Hemoglobin: 14.7 g/dL (ref 13.0–17.7)
MCH: 27.9 pg (ref 26.6–33.0)
MCHC: 32.2 g/dL (ref 31.5–35.7)
MCV: 87 fL (ref 79–97)
PLATELETS: 217 10*3/uL (ref 150–379)
RBC: 5.27 x10E6/uL (ref 4.14–5.80)
RDW: 15.4 % (ref 12.3–15.4)
WBC: 9.3 10*3/uL (ref 3.4–10.8)

## 2017-10-14 LAB — TESTOSTERONE,FREE AND TOTAL
TESTOSTERONE: 52 ng/dL — AB (ref 264–916)
Testosterone, Free: 1.6 pg/mL — ABNORMAL LOW (ref 6.6–18.1)

## 2017-10-14 NOTE — Telephone Encounter (Signed)
LMOVM for pt to return call 

## 2017-10-14 NOTE — Telephone Encounter (Signed)
-----   Message from Malva Limes, MD sent at 10/14/2017  8:16 AM EDT ----- testesterone level is up slightly, but still very low. Please verify that he has been taking 1/2 ml every fourteen days. If so then he needs to double dose to 1 ml every 14 days and recheck levels in one month.  ALSO cholesterol is high at 216, needs to be under 80 since he has diabetes. Patient used to be on simvastatin, please see if he is still taking this.

## 2017-10-19 MED ORDER — SIMVASTATIN 40 MG PO TABS: 40.0000 mg | ORAL_TABLET | Freq: Every day | ORAL | 4 refills | Status: DC

## 2017-10-19 NOTE — Telephone Encounter (Signed)
Simvastatin is very effective at preventing heart attacks and rarely has any side effects. Have sent new prescription to [facility] to get back on medication.

## 2017-10-19 NOTE — Telephone Encounter (Signed)
[id number] 

## 2017-10-19 NOTE — Telephone Encounter (Signed)
[id number] 

## 2017-10-19 NOTE — Telephone Encounter (Signed)
Please advise 

## 2017-10-19 NOTE — Telephone Encounter (Signed)
Patient was advised, he states that he has been taking 1/21ml every 14 days and will increase to 61ml. Patient states that he was no longer taking Simvastatin, patient states that when he ran out of medication he did not ask for a refill because he heard medication has bad side effects long term. Please advise if you would like patient to start back on statin? Future lab placed in chart and patient advised to have lab drawn in one month. KW

## 2017-11-03 ENCOUNTER — Ambulatory Visit (INDEPENDENT_AMBULATORY_CARE_PROVIDER_SITE_OTHER): Payer: Medicare Other | Admitting: Orthotics

## 2017-11-03 DIAGNOSIS — E0843 Diabetes mellitus due to underlying condition with diabetic autonomic (poly)neuropathy: Secondary | ICD-10-CM | POA: Diagnosis not present

## 2017-11-03 DIAGNOSIS — M21611 Bunion of right foot: Secondary | ICD-10-CM

## 2017-11-03 DIAGNOSIS — E1142 Type 2 diabetes mellitus with diabetic polyneuropathy: Secondary | ICD-10-CM

## 2017-11-03 DIAGNOSIS — M21612 Bunion of left foot: Secondary | ICD-10-CM

## 2017-11-03 DIAGNOSIS — M21619 Bunion of unspecified foot: Secondary | ICD-10-CM

## 2017-11-03 NOTE — Progress Notes (Signed)

## 2017-11-23 ENCOUNTER — Other Ambulatory Visit: Payer: Self-pay | Admitting: Family Medicine

## 2017-11-23 DIAGNOSIS — E1165 Type 2 diabetes mellitus with hyperglycemia: Secondary | ICD-10-CM

## 2017-11-23 MED ORDER — CANAGLIFLOZIN 300 MG PO TABS: 300.0000 mg | ORAL_TABLET | Freq: Every day | ORAL | 0 refills | Status: DC

## 2017-11-23 NOTE — Telephone Encounter (Signed)
He can have 4 bottles

## 2017-11-23 NOTE — Telephone Encounter (Signed)
Please advise 

## 2017-11-23 NOTE — Telephone Encounter (Signed)
Pt requesting samples of Invokana 300 MG.  Pt states he is out of medication.

## 2017-11-23 NOTE — Telephone Encounter (Signed)
Left detailed message on pt's vm

## 2017-11-30 ENCOUNTER — Telehealth: Payer: Self-pay | Admitting: Family Medicine

## 2017-11-30 DIAGNOSIS — E291 Testicular hypofunction: Secondary | ICD-10-CM

## 2017-11-30 MED ORDER — TESTOSTERONE CYPIONATE 200 MG/ML IM SOLN
200.0000 mg | INTRAMUSCULAR | 0 refills | Status: DC
Start: 2017-11-30 — End: 2018-01-17

## 2017-11-30 NOTE — Telephone Encounter (Signed)
Taylor [name] with [facility] stated pt advised him that Dr. Sherrie Mustache had increased pt's testosterone cypionate (DEPOTESTOSTERONE CYPIONATE) 200 MG/ML injection from 0.5 mg to 1 mg injection. Taylor [name] is requesting a new Rx if the dose change is correct. Taylor [name] was advised that Dr. Sherrie Mustache is out of the office. Please advise. Thanks TNP

## 2017-11-30 NOTE — Telephone Encounter (Signed)
Please advise 

## 2017-12-15 ENCOUNTER — Telehealth: Payer: Self-pay | Admitting: Family Medicine

## 2017-12-15 DIAGNOSIS — E1165 Type 2 diabetes mellitus with hyperglycemia: Secondary | ICD-10-CM

## 2017-12-15 MED ORDER — CANAGLIFLOZIN 300 MG PO TABS: 300.0000 mg | ORAL_TABLET | Freq: Every day | ORAL | 0 refills | Status: DC

## 2017-12-15 NOTE — Telephone Encounter (Signed)
Dr. Sherrie Mustache, is it ok to give samples? We have about 10 bottles left in our sample closet.

## 2017-12-15 NOTE — Telephone Encounter (Signed)
Patient is out of Invokana 300 mg.  He said he gets sample from [place]. Please call when they are ready.

## 2017-12-15 NOTE — Telephone Encounter (Signed)
He can have 6 sample bottles, thanks.

## 2017-12-15 NOTE — Telephone Encounter (Signed)
Tried calling patient. Left message on voice message system that samples are ready for pick up.

## 2017-12-20 ENCOUNTER — Ambulatory Visit (INDEPENDENT_AMBULATORY_CARE_PROVIDER_SITE_OTHER): Payer: Medicare Other | Admitting: Family Medicine

## 2017-12-20 ENCOUNTER — Encounter: Payer: Self-pay | Admitting: Family Medicine

## 2017-12-20 VITALS — BP 124/88 | HR 92 | Temp 98.0°F | Resp 16 | Ht 73.0 in | Wt 336.0 lb

## 2017-12-20 DIAGNOSIS — M545 Low back pain: Secondary | ICD-10-CM | POA: Diagnosis not present

## 2017-12-20 DIAGNOSIS — E1165 Type 2 diabetes mellitus with hyperglycemia: Secondary | ICD-10-CM | POA: Diagnosis not present

## 2017-12-20 DIAGNOSIS — M5416 Radiculopathy, lumbar region: Secondary | ICD-10-CM

## 2017-12-20 DIAGNOSIS — E291 Testicular hypofunction: Secondary | ICD-10-CM

## 2017-12-20 DIAGNOSIS — G8929 Other chronic pain: Secondary | ICD-10-CM

## 2017-12-20 LAB — POCT GLYCOSYLATED HEMOGLOBIN (HGB A1C)
ESTIMATED AVERAGE GLUCOSE: 209
HEMOGLOBIN A1C: 8.9 % — AB (ref 4.0–5.6)

## 2017-12-20 MED ORDER — LUBIPROSTONE 24 MCG PO CAPS: 24.0000 ug | ORAL_CAPSULE | Freq: Two times a day (BID) | ORAL | 3 refills | Status: DC

## 2017-12-20 NOTE — Progress Notes (Signed)
Patient: Taylor [name] Male    DOB: 14-Jun-1955   62 y.o.   MRN: [id number] Visit Date: 12/20/2017  Today's Provider: Mila Merry, MD   Chief Complaint  Patient presents with  . Follow-up  . Diabetes  . Hypertension  . Hyperlipidemia  . Hypogonadism   Subjective:    HPI   Diabetes Mellitus Type II, Follow-up:   Lab Results  Component Value Date   HGBA1C 8.9 (A) 12/20/2017   HGBA1C 9.3 09/20/2017   HGBA1C 9.6 07/01/2017   Last seen for diabetes 3 months ago.  Management since then includes; increased pioglitazone to 45 mg qd. counseled regarding diet and exercise.  He states he ran out of Invokana last week, but had been taking consistently before then.  He reports good compliance with treatment. He is not having side effects. none Current symptoms include none and have been unchanged. Home blood sugar records: fasting range: 150/200  Episodes of hypoglycemia? no   Current Insulin Regimen: n/a Most Recent Eye Exam: due Weight trend: increasing steadily Prior visit with dietician: no Current diet: in general, a "healthy" diet   Current exercise: none  Wt Readings from Last 3 Encounters:  12/20/17 (!) 336 lb (152.4 kg)  09/20/17 (!) 322 lb (146.1 kg)  07/01/17 (!) 313 lb (142 kg)    ----------------------------------------------------------------    Hypertension, follow-up:  BP Readings from Last 3 Encounters:  12/20/17 124/88  09/20/17 124/82  07/01/17 140/88    He was last seen for hypertension 4 months ago.  BP at that visit was 124/82. Management since that visit includes; no changes.He reports good compliance with treatment. He is not having side effects. none He is not exercising. He is adherent to low salt diet.   Outside blood pressures are not checking. He is experiencing none.  Patient denies none.   Cardiovascular risk factors include diabetes mellitus.  Use of agents associated with hypertension: none.    ----------------------------------------------------------------    Lipid/Cholesterol, Follow-up:   Last seen for this 3 months ago.  Management since that visit includes; restarted simvastatin.  Last Lipid Panel:    Component Value Date/Time   CHOL 216 (H) 10/13/2017 1157   TRIG 191 (H) 10/13/2017 1157   HDL 53 10/13/2017 1157   CHOLHDL 4.1 10/13/2017 1157   LDLCALC 125 (H) 10/13/2017 1157    He reports good compliance with treatment. He is not having side effects. none  Wt Readings from Last 3 Encounters:  12/20/17 (!) 336 lb (152.4 kg)  09/20/17 (!) 322 lb (146.1 kg)  07/01/17 (!) 313 lb (142 kg)    ----------------------------------------------------------------   Hypogonadism, male From 09/20/2017-increased testosterone to 1 mL every 14 days. He states he has been late on several doses of medication lately, and has been nearly three weeks since last dose. Still feels tired all the time and doesn't feel like doing anything.   Lab Results  Component Value Date   TESTOSTERONE 52 (L) 10/13/2017    Chronic lumbar back pain: He has had persistent low back pain since work place accident in the early 2000s in [place]. He had surgery for herniated disk around that time and has continued to be managed by pain clinic in [facility], [place] since then. He has a daughter who lives in that area, but he states the pain clinic is requiring him to go down there more frequently which has been increasingly difficult for him to do. States pain gets even worse after  long drives. He has been on current regiment of pain medications for several years, but feels pain is limiting his mobility and activity level more and more. He wants to stop having to go to [place] and be followed locally for his back pain and disk disease. He has a follow up scheduled in [place] on July 20th and is hoping not to have to make any trips to pain clinic in [facility] after that visit.     No  Known Allergies   Current Outpatient Medications:  .  aspirin 81 MG chewable tablet, 81 mg once daily., Disp: , Rfl:  .  canagliflozin (INVOKANA) 300 MG TABS tablet, Take 1 tablet (300 mg total) by mouth daily before breakfast., Disp: 30 tablet, Rfl: 0 .  cyanocobalamin 100 MCG tablet, Take 100 mcg by mouth daily., Disp: , Rfl:  .  glipiZIDE (GLUCOTROL XL) 10 MG 24 hr tablet, TAKE 1 TABLET BY MOUTH ONCE DAILY., Disp: 30 tablet, Rfl: 12 .  HYDROmorphone (DILAUDID) 4 MG tablet, Take 4 mg by mouth every 6 (six) hours as needed. , Disp: , Rfl:  .  lisinopril (PRINIVIL,ZESTRIL) 10 MG tablet, TAKE 1 TABLET BY MOUTH ONCE DAILY., Disp: 30 tablet, Rfl: 11 .  metFORMIN (GLUCOPHAGE) 1000 MG tablet, TAKE 1 TABLET BY MOUTH TWICE DAILY, Disp: 60 tablet, Rfl: 6 .  NONFORMULARY OR COMPOUNDED ITEM, See pharmacy note, Disp: 120 each, Rfl: 2 .  oxyCODONE (OXYCONTIN) 40 mg 12 hr tablet, 40 mg 2 (two) times daily., Disp: , Rfl:  .  OXYCODONE HCL, Take 40 mg by mouth every 4 (four) hours as needed. , Disp: , Rfl:  .  pioglitazone (ACTOS) 45 MG tablet, Take 1 tablet (45 mg total) by mouth daily., Disp: 30 tablet, Rfl: 5 .  pregabalin (LYRICA) 75 MG capsule, Take by mouth., Disp: , Rfl:  .  simvastatin (ZOCOR) 40 MG tablet, Take 1 tablet (40 mg total) by mouth daily., Disp: 90 tablet, Rfl: 4 .  testosterone cypionate (DEPOTESTOSTERONE CYPIONATE) 200 MG/ML injection, Inject 1 mL (200 mg total) into the muscle every 14 (fourteen) days., Disp: 3 mL, Rfl: 0 .  zolpidem (AMBIEN CR) 12.5 MG CR tablet, AMBIEN CR, 12.5MG  (Oral Tablet Extended Release)  1 PO QHS for 0 days  Quantity: 30.00;  Refills: 0   Ordered :15-Jul-2010  Nilda Simmer MD;  Started 03-Mar-2007 Active, Disp: , Rfl:  .  Ibuprofen-Famotidine 800-26.6 MG TABS, Take 1 tablet by mouth 3 (three) times daily as needed., Disp: , Rfl:   Review of Systems  Constitutional: Negative for appetite change, chills and fever.  Respiratory: Negative for chest tightness,  shortness of breath and wheezing.   Cardiovascular: Negative for chest pain and palpitations.  Gastrointestinal: Negative for abdominal pain, nausea and vomiting.    Social History   Tobacco Use  . Smoking status: Never Smoker  . Smokeless tobacco: Never Used  Substance Use Topics  . Alcohol use: No    Alcohol/week: 0.0 oz   Objective:   BP 124/88 (BP Location: Right Arm, Patient Position: Sitting, Cuff Size: Large)   Pulse 92   Temp 98 F (36.7 C) (Oral)   Resp 16   Ht 6\' 1"  (1.854 m)   Wt (!) 336 lb (152.4 kg)   SpO2 95%   BMI 44.33 kg/m  Vitals:   12/20/17 1112  BP: 124/88  Pulse: 92  Resp: 16  Temp: 98 F (36.7 C)  TempSrc: Oral  SpO2: 95%  Weight: (!) 336 lb (  152.4 kg)  Height: 6\' 1"  (1.854 m)     Physical Exam  General Appearance:    Alert, cooperative, no distress, obese  Eyes:    PERRL, conjunctiva/corneas clear, EOM's intact       Lungs:     Clear to auscultation bilaterally, respirations unlabored  Heart:    Regular rate and rhythm  Neurologic:   Awake, alert, oriented x 3. No apparent focal neurological           defect.       Results for orders placed or performed in visit on 12/20/17  POCT glycosylated hemoglobin (Hb A1C)  Result Value Ref Range   Hemoglobin A1C 8.9 (A) 4.0 - 5.6 %   HbA1c POC (<> result, manual entry)  4.0 - 5.6 %   HbA1c, POC (prediabetic range)  5.7 - 6.4 %   HbA1c, POC (controlled diabetic range)  0.0 - 7.0 %   Est. average glucose Bld gHb Est-mCnc 209        Assessment & Plan:     1. Uncontrolled type 2 diabetes mellitus with hyperglycemia (HCC) Slight improvement with increased dose of pioglitazone. Consider adding SGLT2 inhibitor, although medication cost is already a challenge.  - POCT glycosylated hemoglobin (Hb A1C)  2. Lumbar radiculopathy, chronic Has been followed in [place], [place] every since his original injury when he lived there nearly 20 years ago. He needs to establish with local physicians due to  increasing difficulties with travel.  - Ambulatory referral to Pain Clinic  3. Chronic low back pain, unspecified back pain laterality, with sciatica presence unspecified   4. Hypogonadism, male Currently behind schedule on testosterone injections. He is to get back on consistent schedule and check levels at follow up in August.   5. Morbid obesity (HCC) Multifactorial. May be exacerbated by pioglitzoaone, clearly exacerbated by lack of physical activity which is limited by his pain. May also be affected by low T. Consider addition of Ozempic if T levels normal as above.   Follow up for CPE in August as scheduled.        Mila Merry, MD  [facility]

## 2018-01-12 ENCOUNTER — Encounter: Payer: Medicare Other | Admitting: Family Medicine

## 2018-01-12 NOTE — Progress Notes (Deleted)
Patient: Taylor [name], Male    DOB: [birth year], 62 y.o.   MRN: [id number] Visit Date: 01/12/2018  Today's Provider: Mila Merry, MD   No chief complaint on file.  Subjective:    Annual physical exam Taylor [name] is a 62 y.o. male who presents today for health maintenance and complete physical. He feels {DESC; WELL/FAIRLY WELL/POORLY:18703}. He reports exercising ***. He reports he is sleeping {DESC; WELL/FAIRLY WELL/POORLY:18703}.  -----------------------------------------------------------------  Diabetes Mellitus Type II, Follow-up:   Lab Results  Component Value Date   HGBA1C 8.9 (A) 12/20/2017   HGBA1C 9.3 09/20/2017   HGBA1C 9.6 07/01/2017   Last seen for diabetes 3 weeks ago.  Management since then includes; no changes. He reports {excellent/good/fair/poor:19665} compliance with treatment. He {ACTION; IS/IS MVH:[id number]} having side effects. *** Current symptoms include {Symptoms; diabetes:14075} and have been {Desc; course:15616}. Home blood sugar records: {diabetes glucometry results:16657}  Episodes of hypoglycemia? {yes***/no:17258}   Current Insulin Regimen: *** Most Recent Eye Exam: *** Weight trend: {trend:16658} Prior visit with dietician: {yes/no:17258} Current diet: {diet habits:16563} Current exercise: {exercise types:16438}  ------------------------------------------------------------------------   Hypertension, follow-up:  BP Readings from Last 3 Encounters:  12/20/17 124/88  09/20/17 124/82  07/01/17 140/88    He was last seen for hypertension 5 months ago.  BP at that visit was 124/82. Management since that visit includes; no changes.He reports {excellent/good/fair/poor:19665} compliance with treatment. He {ACTION; IS/IS MWU:13244010} having side effects. *** He {is/is not:9024} exercising. He {is/is not:9024} adherent to low salt diet.   Outside blood pressures are ***. He is experiencing {Symptoms; cardiac:12860}.    Patient denies {Symptoms; cardiac:12860}.   Cardiovascular risk factors include {cv risk factors:510}.  Use of agents associated with hypertension: {bp agents assoc with hypertension:511::"none"}.   ------------------------------------------------------------------------  Chronic low back pain, unspecified back pain laterality, with sciatica presence unspecified From 12/20/2017-referred to pain clinic.   Hypogonadism, male From 12/20/2017-Currently behind schedule on testosterone injections. Counseled to get back on consistent schedule for testosterone injections and check levels at follow up in August.     Review of Systems  Social History      He  reports that he has never smoked. He has never used smokeless tobacco. He reports that he does not drink alcohol or use drugs.       Social History   Socioeconomic History  . Marital status: Single    Spouse name: Not on file  . Number of children: 1  . Years of education: Not on file  . Highest education level: Not on file  Occupational History  . Not on file  Social Needs  . Financial resource strain: Not on file  . Food insecurity:    Worry: Not on file    Inability: Not on file  . Transportation needs:    Medical: Not on file    Non-medical: Not on file  Tobacco Use  . Smoking status: Never Smoker  . Smokeless tobacco: Never Used  Substance and Sexual Activity  . Alcohol use: No    Alcohol/week: 0.0 oz  . Drug use: No  . Sexual activity: Not on file  Lifestyle  . Physical activity:    Days per week: Not on file    Minutes per session: Not on file  . Stress: Not on file  Relationships  . Social connections:    Talks on phone: Not on file    Gets together: Not on file    Attends religious service: Not on file  Active member of club or organization: Not on file    Attends meetings of clubs or organizations: Not on file    Relationship status: Not on file  Other Topics Concern  . Not on file  Social History  Narrative  . Not on file    Past Medical History:  Diagnosis Date  . Diabetes mellitus without complication (HCC)   . Hypertension      Patient Active Problem List   Diagnosis Date Noted  . Fatty liver 05/07/2015  . Back ache 11/15/2014  . Chronic low back pain 11/15/2014  . Dyspnea on exertion 11/15/2014  . Erectile dysfunction 11/15/2014  . Hypogonadism, male 11/15/2014  . Cervical pain 11/15/2014  . Nocturia 11/15/2014  . Disturbance of skin sensation 11/15/2014  . Seborrheic keratosis 11/15/2014  . Thoracic back pain 11/15/2014  . Hepatomegaly 03/27/2013  . Generalized hyperhidrosis 10/18/2009  . History of depression 03/27/2008  . Premature beats 06/04/2007  . Chronic pain syndrome 03/03/2007  . Diabetes mellitus type 2, uncontrolled (HCC) 03/03/2007  . Hypercholesteremia 03/03/2007  . Hypertension, essential, benign 03/03/2007  . Morbid obesity (HCC) 03/03/2007    Past Surgical History:  Procedure Laterality Date  . BACK SURGERY      Family History        Family Status  Relation Name Status  . Mother  Alive  . Father  Other  . Sister  Alive  . Brother  Alive        His family history includes Diabetes in his mother and sister.      No Known Allergies   Current Outpatient Medications:  .  aspirin 81 MG chewable tablet, 81 mg once daily., Disp: , Rfl:  .  canagliflozin (INVOKANA) 300 MG TABS tablet, Take 1 tablet (300 mg total) by mouth daily before breakfast., Disp: 30 tablet, Rfl: 0 .  cyanocobalamin 100 MCG tablet, Take 100 mcg by mouth daily., Disp: , Rfl:  .  glipiZIDE (GLUCOTROL XL) 10 MG 24 hr tablet, TAKE 1 TABLET BY MOUTH ONCE DAILY., Disp: 30 tablet, Rfl: 12 .  HYDROmorphone (DILAUDID) 4 MG tablet, Take 4 mg by mouth every 6 (six) hours as needed. , Disp: , Rfl:  .  Ibuprofen-Famotidine 800-26.6 MG TABS, Take 1 tablet by mouth 3 (three) times daily as needed., Disp: , Rfl:  .  lisinopril (PRINIVIL,ZESTRIL) 10 MG tablet, TAKE 1 TABLET BY  MOUTH ONCE DAILY., Disp: 30 tablet, Rfl: 11 .  lubiprostone (AMITIZA) 24 MCG capsule, Take 1 capsule (24 mcg total) by mouth 2 (two) times daily with a meal., Disp: 60 capsule, Rfl: 3 .  metFORMIN (GLUCOPHAGE) 1000 MG tablet, TAKE 1 TABLET BY MOUTH TWICE DAILY, Disp: 60 tablet, Rfl: 6 .  NONFORMULARY OR COMPOUNDED ITEM, See pharmacy note, Disp: 120 each, Rfl: 2 .  oxyCODONE (OXYCONTIN) 40 mg 12 hr tablet, 40 mg 2 (two) times daily., Disp: , Rfl:  .  OXYCODONE HCL, Take 40 mg by mouth every 4 (four) hours as needed. , Disp: , Rfl:  .  pioglitazone (ACTOS) 45 MG tablet, Take 1 tablet (45 mg total) by mouth daily., Disp: 30 tablet, Rfl: 5 .  pregabalin (LYRICA) 75 MG capsule, Take by mouth., Disp: , Rfl:  .  simvastatin (ZOCOR) 40 MG tablet, Take 1 tablet (40 mg total) by mouth daily., Disp: 90 tablet, Rfl: 4 .  testosterone cypionate (DEPOTESTOSTERONE CYPIONATE) 200 MG/ML injection, Inject 1 mL (200 mg total) into the muscle every 14 (fourteen) days., Disp: 3 mL, Rfl:  0 .  zolpidem (AMBIEN CR) 12.5 MG CR tablet, AMBIEN CR, 12.5MG  (Oral Tablet Extended Release)  1 PO QHS for 0 days  Quantity: 30.00;  Refills: 0   Ordered :15-Jul-2010  Nilda Simmer MD;  Started 03-Mar-2007 Active, Disp: , Rfl:    Patient Care Team: Malva Limes, MD as PCP - General (Family Medicine)      Objective:   Vitals: There were no vitals taken for this visit.  There were no vitals filed for this visit.   Physical Exam   Depression Screen PHQ 2/9 Scores 09/20/2017 07/30/2016 01/24/2015  PHQ - 2 Score 3 0 4  PHQ- 9 Score 14 - 11      Assessment & Plan:     Routine Health Maintenance and Physical Exam  Exercise Activities and Dietary recommendations Goals    None      Immunization History  Administered Date(s) Administered  . Pneumococcal Polysaccharide-23 11/27/2013  . Tdap 11/25/2012    Health Maintenance  Topic Date Due  . HIV Screening  03/27/1972  . COLONOSCOPY  03/28/2007  . FOOT EXAM   11/15/2015  . OPHTHALMOLOGY EXAM  04/15/2017  . COLON CANCER SCREENING ANNUAL FOBT  08/21/2017  . INFLUENZA VACCINE  01/06/2018  . HEMOGLOBIN A1C  06/22/2018  . PNEUMOCOCCAL POLYSACCHARIDE VACCINE (2) 11/28/2018  . TETANUS/TDAP  11/26/2022  . Hepatitis C Screening  Completed     Discussed health benefits of physical activity, and encouraged him to engage in regular exercise appropriate for his age and condition.    --------------------------------------------------------------------    Mila Merry, MD  [facility]

## 2018-01-17 ENCOUNTER — Other Ambulatory Visit: Payer: Self-pay | Admitting: Family Medicine

## 2018-01-17 DIAGNOSIS — E291 Testicular hypofunction: Secondary | ICD-10-CM

## 2018-01-17 NOTE — Progress Notes (Signed)
Patient: Taylor [name], Male    DOB: 04/06/1956, 62 y.o.   MRN: [id number] Visit Date: 01/19/2018  Today's Provider: Mila Merry, MD   Chief Complaint  Patient presents with  . Annual Exam  . Diabetes   Subjective:    Annual physical exam Taylor [name] is a 62 y.o. male who presents today for health maintenance and complete physical. He feels poorly. He reports exercising none. He reports he is sleeping poorly.  ---------------------------------------------------------------   Diabetes Mellitus Type II, Follow-up:   Lab Results  Component Value Date   HGBA1C 8.9 (A) 12/20/2017   HGBA1C 9.3 09/20/2017   HGBA1C 9.6 07/01/2017   Last seen for diabetes 1 months ago.  Management since then includes; no changes. He reports good compliance with treatment. He is not having side effects. none Current symptoms include paresthesia of the feet and have been unchanged. Home blood sugar records: fasting range: 150-200  Episodes of hypoglycemia? no   Current Insulin Regimen: n/a Most Recent Eye Exam: 04/15/2016 Weight trend: stable Prior visit with dietician: no Current diet: in general, a "healthy" diet   Current exercise: none  ---------------------------------------------------------------   Hypertension, follow-up:  BP Readings from Last 3 Encounters:  01/19/18 140/80  12/20/17 124/88  09/20/17 124/82    He was last seen for hypertension 5 months ago.  BP at that visit was 124/82. Management since that visit includes; no changes.He reports good compliance with treatment. He is not having side effects. none He is not exercising. He is adherent to low salt diet.   Outside blood pressures are not checking. He is experiencing none.  Patient denies none.   Cardiovascular risk factors include diabetes mellitus.  Use of agents associated with hypertension: none.   ---------------------------------------------------------------  Hypogonadism,  male From 12/20/2017-counseled to get back on constant schedule for testosterone injections and check levels in August. He states he did take injection this morning and has been taking mostly on schedule since last month. He does admit to having to urinate frequently at night  And has some issues with hesitancy.    Chronic low back pain From 12/20/2017-referred to Pain Clinic. Is in process of getting insurance and workman's comp to approve referral.    Review of Systems  Constitutional: Positive for diaphoresis and fatigue.  HENT: Negative.   Eyes: Negative.   Respiratory: Negative.   Cardiovascular: Negative.   Gastrointestinal: Positive for constipation.  Endocrine: Positive for heat intolerance and polydipsia.  Genitourinary: Positive for difficulty urinating.  Musculoskeletal: Positive for back pain, myalgias and neck stiffness.  Skin: Negative.   Allergic/Immunologic: Negative.   Neurological: Negative.   Hematological: Negative.   Psychiatric/Behavioral: Positive for decreased concentration and sleep disturbance.    Social History      He  reports that he has never smoked. He has never used smokeless tobacco. He reports that he does not drink alcohol or use drugs.       Social History   Socioeconomic History  . Marital status: Single    Spouse name: Not on file  . Number of children: 1  . Years of education: Not on file  . Highest education level: Not on file  Occupational History  . Not on file  Social Needs  . Financial resource strain: Not on file  . Food insecurity:    Worry: Not on file    Inability: Not on file  . Transportation needs:    Medical: Not on file  Non-medical: Not on file  Tobacco Use  . Smoking status: Never Smoker  . Smokeless tobacco: Never Used  Substance and Sexual Activity  . Alcohol use: No    Alcohol/week: 0.0 standard drinks  . Drug use: No  . Sexual activity: Not on file  Lifestyle  . Physical activity:    Days per week: Not  on file    Minutes per session: Not on file  . Stress: Not on file  Relationships  . Social connections:    Talks on phone: Not on file    Gets together: Not on file    Attends religious service: Not on file    Active member of club or organization: Not on file    Attends meetings of clubs or organizations: Not on file    Relationship status: Not on file  Other Topics Concern  . Not on file  Social History Narrative  . Not on file    Past Medical History:  Diagnosis Date  . Diabetes mellitus without complication (HCC)   . Hypertension      Patient Active Problem List   Diagnosis Date Noted  . Fatty liver 05/07/2015  . Back ache 11/15/2014  . Chronic low back pain 11/15/2014  . Dyspnea on exertion 11/15/2014  . Erectile dysfunction 11/15/2014  . Hypogonadism, male 11/15/2014  . Cervical pain 11/15/2014  . Nocturia 11/15/2014  . Disturbance of skin sensation 11/15/2014  . Seborrheic keratosis 11/15/2014  . Thoracic back pain 11/15/2014  . Hepatomegaly 03/27/2013  . Generalized hyperhidrosis 10/18/2009  . History of depression 03/27/2008  . Premature beats 06/04/2007  . Chronic pain syndrome 03/03/2007  . Diabetes mellitus type 2, uncontrolled (HCC) 03/03/2007  . Hypercholesteremia 03/03/2007  . Hypertension, essential, benign 03/03/2007  . Morbid obesity (HCC) 03/03/2007    Past Surgical History:  Procedure Laterality Date  . BACK SURGERY      Family History        Family Status  Relation Name Status  . Mother  Alive  . Father  Other  . Sister  Alive  . Brother  Alive        His family history includes Diabetes in his mother and sister.      No Known Allergies   Current Outpatient Medications:  .  aspirin 81 MG chewable tablet, 81 mg once daily., Disp: , Rfl:  .  canagliflozin (INVOKANA) 300 MG TABS tablet, Take 1 tablet (300 mg total) by mouth daily before breakfast., Disp: 30 tablet, Rfl: 0 .  glipiZIDE (GLUCOTROL XL) 10 MG 24 hr tablet, TAKE 1  TABLET BY MOUTH ONCE DAILY., Disp: 30 tablet, Rfl: 12 .  HYDROmorphone (DILAUDID) 4 MG tablet, Take 4 mg by mouth every 6 (six) hours as needed. , Disp: , Rfl:  .  lisinopril (PRINIVIL,ZESTRIL) 10 MG tablet, TAKE 1 TABLET BY MOUTH ONCE DAILY., Disp: 30 tablet, Rfl: 11 .  lubiprostone (AMITIZA) 24 MCG capsule, Take 1 capsule (24 mcg total) by mouth 2 (two) times daily with a meal., Disp: 60 capsule, Rfl: 3 .  meloxicam (MOBIC) 7.5 MG tablet, Take 7.5 mg by mouth 2 (two) times daily., Disp: , Rfl: 2 .  metFORMIN (GLUCOPHAGE) 1000 MG tablet, TAKE 1 TABLET BY MOUTH TWICE DAILY, Disp: 60 tablet, Rfl: 6 .  NONFORMULARY OR COMPOUNDED ITEM, See pharmacy note, Disp: 120 each, Rfl: 2 .  oxyCODONE (OXYCONTIN) 40 mg 12 hr tablet, 40 mg 2 (two) times daily., Disp: , Rfl:  .  OXYCODONE HCL, Take 40  mg by mouth every 4 (four) hours as needed. , Disp: , Rfl:  .  pioglitazone (ACTOS) 45 MG tablet, Take 1 tablet (45 mg total) by mouth daily., Disp: 30 tablet, Rfl: 5 .  pregabalin (LYRICA) 75 MG capsule, Take by mouth., Disp: , Rfl:  .  simvastatin (ZOCOR) 40 MG tablet, Take 1 tablet (40 mg total) by mouth daily., Disp: 90 tablet, Rfl: 4 .  testosterone cypionate (DEPOTESTOSTERONE CYPIONATE) 200 MG/ML injection, INJECT (200MG ) INTO THE MUSCLE EVERY 14 DAYS, Disp: 2 mL, Rfl: 4 .  zolpidem (AMBIEN CR) 12.5 MG CR tablet, AMBIEN CR, 12.5MG  (Oral Tablet Extended Release)  1 PO QHS for 0 days  Quantity: 30.00;  Refills: 0   Ordered :15-Jul-2010  Nilda Simmer MD;  Started 03-Mar-2007 Active, Disp: , Rfl:  .  cyanocobalamin 100 MCG tablet, Take 100 mcg by mouth daily., Disp: , Rfl:  .  Ibuprofen-Famotidine 800-26.6 MG TABS, Take 1 tablet by mouth 3 (three) times daily as needed., Disp: , Rfl:    Patient Care Team: Malva Limes, MD as PCP - General (Family Medicine)      Objective:   Vitals: BP 140/80 (BP Location: Right Arm, Cuff Size: Large)   Pulse 89   Temp 98 F (36.7 C) (Oral)   Resp 16   Ht 6\' 1"   (1.854 m)   Wt (!) 335 lb (152 kg)   SpO2 93%   BMI 44.20 kg/m    Vitals:   01/19/18 1525 01/19/18 1533  BP: (!) 165/102 140/80  Pulse: 89   Resp: 16   Temp: 98 F (36.7 C)   TempSrc: Oral   SpO2: 93%   Weight: (!) 335 lb (152 kg)   Height: 6\' 1"  (1.854 m)      Physical Exam   General Appearance:    Alert, cooperative, no distress, appears stated age, morbidly obese  Head:    Normocephalic, without obvious abnormality, atraumatic  Eyes:    PERRL, conjunctiva/corneas clear, EOM's intact, fundi    benign, both eyes       Ears:    Normal TM's and external ear canals, both ears  Nose:   Nares normal, septum midline, mucosa normal, no drainage   or sinus tenderness  Throat:   Lips, mucosa, and tongue normal; teeth and gums normal  Neck:   Supple, symmetrical, trachea midline, no adenopathy;       thyroid:  No enlargement/tenderness/nodules; no carotid   bruit or JVD  Back:     Symmetric, no curvature, ROM normal, no CVA tenderness  Lungs:     Clear to auscultation bilaterally, respirations unlabored  Chest wall:    No tenderness or deformity  Heart:    Regular rate and rhythm, S1 and S2 normal, no murmur, rub   or gallop  Abdomen:     Soft, non-tender, bowel sounds active all four quadrants,    no masses, no organomegaly  Genitalia:    deferred  Rectal:    deferred  Extremities:   Extremities normal, atraumatic, no cyanosis or edema  Pulses:   2+ and symmetric all extremities  Skin:   Skin color, texture, turgor normal, no rashes or lesions  Lymph nodes:   Cervical, supraclavicular, and axillary nodes normal  Neurologic:   CNII-XII intact. Normal strength, sensation and reflexes      throughout     Depression Screen PHQ 2/9 Scores 01/19/2018 09/20/2017 07/30/2016 01/24/2015  PHQ - 2 Score 4 3 0 4  PHQ-  9 Score 13 14 - 11      Assessment & Plan:     Routine Health Maintenance and Physical Exam  Exercise Activities and Dietary recommendations Goals   None      Immunization History  Administered Date(s) Administered  . Pneumococcal Polysaccharide-23 11/27/2013  . Tdap 11/25/2012    Health Maintenance  Topic Date Due  . HIV Screening  03/27/1972  . COLONOSCOPY  03/28/2007  . FOOT EXAM  11/15/2015  . OPHTHALMOLOGY EXAM  04/15/2017  . COLON CANCER SCREENING ANNUAL FOBT  08/21/2017  . INFLUENZA VACCINE  01/06/2018  . HEMOGLOBIN A1C  06/22/2018  . TETANUS/TDAP  11/26/2022  . PNEUMOCOCCAL POLYSACCHARIDE VACCINE AGE 33-64 HIGH RISK  Completed  . Hepatitis C Screening  Completed     Discussed health benefits of physical activity, and encouraged him to engage in regular exercise appropriate for his age and condition.    --------------------------------------------------------------------  1. Annual physical exam   2. Hypogonadism, male Had testosterone injection. Will check in about 2 weeks before his next injection is due.  - Testosterone,Free and Total  3. Prostate cancer screening  - PSA - Testosterone,Free and Total  4. Urinary hesitancy Counseled that testosterone injection could aggravate BPH. Advised there are prescription medications such as tamsulosin which can help with this, but he states he doesn't want to take another pill and symptoms or tolerable.   5. Premature beats Occasional PVCs.  - EKG 12-Lead  6. Hypertension, essential, benign Relative well controlled.  - EKG 12-Lead  7. First degree AV block  8. Morbid obesity Diet and exercise to lose 5% of body mass.   Return in about 3 months (around 04/21/2018) for diabetes.     Mila Merry, MD  [facility]

## 2018-01-19 ENCOUNTER — Encounter: Payer: Self-pay | Admitting: Family Medicine

## 2018-01-19 ENCOUNTER — Ambulatory Visit (INDEPENDENT_AMBULATORY_CARE_PROVIDER_SITE_OTHER): Payer: Medicare Other | Admitting: Family Medicine

## 2018-01-19 VITALS — BP 140/80 | HR 89 | Temp 98.0°F | Resp 16 | Ht 73.0 in | Wt 335.0 lb

## 2018-01-19 DIAGNOSIS — Z0000 Encounter for general adult medical examination without abnormal findings: Secondary | ICD-10-CM | POA: Diagnosis not present

## 2018-01-19 DIAGNOSIS — I10 Essential (primary) hypertension: Secondary | ICD-10-CM

## 2018-01-19 DIAGNOSIS — E291 Testicular hypofunction: Secondary | ICD-10-CM

## 2018-01-19 DIAGNOSIS — R3911 Hesitancy of micturition: Secondary | ICD-10-CM | POA: Diagnosis not present

## 2018-01-19 DIAGNOSIS — I4949 Other premature depolarization: Secondary | ICD-10-CM | POA: Diagnosis not present

## 2018-01-19 DIAGNOSIS — Z125 Encounter for screening for malignant neoplasm of prostate: Secondary | ICD-10-CM

## 2018-01-19 DIAGNOSIS — I440 Atrioventricular block, first degree: Secondary | ICD-10-CM

## 2018-01-19 NOTE — Patient Instructions (Addendum)
.   Please go to the lab draw station in [address] on the second floor of [facility] on Monday, August 26th   Bring to [facility] stool test back to screen for colon cancer.    The CDC recommends two doses of Shingrix (the shingles vaccine) separated by 2 to 6 months for adults age 61 years and older. I recommend checking with your pharmacy plan regarding coverage for this vaccine.

## 2018-01-20 ENCOUNTER — Ambulatory Visit: Payer: Medicare Other | Admitting: Nurse Practitioner

## 2018-01-31 DIAGNOSIS — Z125 Encounter for screening for malignant neoplasm of prostate: Secondary | ICD-10-CM | POA: Diagnosis not present

## 2018-01-31 DIAGNOSIS — E291 Testicular hypofunction: Secondary | ICD-10-CM | POA: Diagnosis not present

## 2018-02-01 ENCOUNTER — Telehealth: Payer: Self-pay | Admitting: *Deleted

## 2018-02-01 LAB — TESTOSTERONE,FREE AND TOTAL
TESTOSTERONE FREE: 2.7 pg/mL — AB (ref 6.6–18.1)
TESTOSTERONE: 199 ng/dL — AB (ref 264–916)

## 2018-02-01 LAB — PSA: Prostate Specific Ag, Serum: 0.7 ng/mL (ref 0.0–4.0)

## 2018-02-01 NOTE — Telephone Encounter (Signed)
LMOVM for pt to return call 

## 2018-02-01 NOTE — Telephone Encounter (Signed)
-----   Message from Malva Limes, MD sent at 02/01/2018  8:05 AM EDT ----- testosterone levels are still a little low at 199, but up quite a bit from baseline of 52. Suggest he increase to 200mg  testosterone once a week. Schedule follow up o.v. 2 months for diabetes and to check testosterone.

## 2018-02-02 ENCOUNTER — Other Ambulatory Visit: Payer: Self-pay | Admitting: Family Medicine

## 2018-02-02 NOTE — Telephone Encounter (Signed)
Pt advised. Apt scheduled for 10/31  Thanks,   -Vernona Rieger

## 2018-02-13 NOTE — Progress Notes (Signed)
Patient's Name: Taylor [name]  MRN: [id number]  Referring Provider: Birdie Sons, MD  DOB: February 24, 1956  PCP: Birdie Sons, MD  DOS: 02/16/2018  Note by: Gaspar Cola, MD  Service setting: Ambulatory outpatient  Specialty: Interventional Pain Management  Location: [facility] (AMB) Pain Management Facility  Visit type: Initial Patient Evaluation  Patient type: New Patient   Primary Reason(s) for Visit: Encounter for initial evaluation of one or more chronic problems (new to examiner) potentially causing chronic pain, and posing a threat to normal musculoskeletal function. (Level of risk: High) CC: Back Pain  HPI  Taylor [name] is a 62 y.o. year old, male patient, who comes today to see [place] for the first time for an initial evaluation of his chronic pain. He has Failed back surgical syndrome; Chronic low back pain (Bilateral) ; Chronic pain syndrome; History of depression; Diabetes mellitus type 2, uncontrolled ([place]); Dyspnea on exertion; Generalized hyperhidrosis; Hepatomegaly; Hypercholesteremia; Hypertension, essential, benign; Hypogonadism, male; Cervicalgia; Nocturia; Morbid obesity ([place]); Disturbance of skin sensation; Premature beats; Seborrheic keratosis; Thoracic back pain; Fatty liver; First degree AV block; Abnormal MRI, lumbar spine (2016); Lumbar facet arthropathy (Bilateral); Lumbar facet hypertrophy (Bilateral); Lumbar facet syndrome; Spondylosis without myelopathy or radiculopathy, lumbosacral region; Disorder of skeletal system; Pharmacologic therapy; Problems influencing health status; High risk medication use; Long term prescription opiate use; Opiate use (304 MME/day); Osteoarthritis of facet joint of lumbar spine; Chronic musculoskeletal pain; Neurogenic pain; DDD (degenerative disc disease), cervical; DDD (degenerative disc disease), lumbar; Morbid obesity with BMI of 40.0-44.9, adult ([place]); Therapeutic opioid induced constipation; Opioid-induced sleep disorder ([place]);  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 Today he comes in for evaluation of his Back Pain  Pain Assessment: Location: Lower Back(back and both leg) Radiating: Pain radiaties down both hip and leg to foot, foot tingling, worse on the right side Onset: More than a month ago Duration: Chronic pain Quality: Burning, Shooting, Throbbing, Constant, Tingling Severity: 6 /10 (subjective, self-reported pain score)  Note: Reported level is inconsistent with clinical observations. Clinically the patient looks like a 3/10 A 3/10 is viewed as "Moderate" and described as significantly interfering with activities of daily living (ADL). It becomes difficult to feed, bathe, get dressed, get on and off the toilet or to perform personal hygiene functions. Difficult to get in and out of bed or a chair without assistance. Very distracting. With effort, it can be ignored when deeply involved in activities. Information on the proper use of the pain scale provided to the patient today. When using our objective Pain Scale, levels between 6 and 10/10 are said to belong in an emergency room, as it progressively worsens from a 6/10, described as severely limiting, requiring emergency care not usually available at an outpatient pain management facility. At a 6/10 level, communication becomes difficult and requires great effort. Assistance to reach the emergency department may be required. Facial flushing and profuse sweating along with potentially dangerous increases in heart rate and blood pressure will be evident. Effect on ADL: limits my daily activites, unable to work Timing:  Constant Modifying factors: medications BP:    HR: 88  Onset and Duration: Date of injury: 2001 Cause of pain: Worker's Compensation Injury Severity: Getting worse, NAS-11 at its worse: 8/10, NAS-11 at its  best: 6/10, NAS-11 now: 6/10 and NAS-11 on the average: 7/10 Timing: Not influenced by the time of the day, During activity or exercise and After activity or exercise Aggravating Factors: Bending, Climbing, Kneeling, Lifiting, Motion, Prolonged sitting, Prolonged standing, Squatting and Stooping  Alleviating Factors: nothing Associated Problems: Constipation, Night-time cramps, Depression, Erectile dysfunction, Fatigue, Sadness, Spasms, Sweating, Temperature changes, Weakness, Pain that wakes patient up and Pain that does not allow patient to sleep Quality of Pain: Aching, Agonizing, Burning, Constant, Deep, Disabling, Distressing, Dreadful, Exhausting, Hot, Nagging, Sharp, Shooting, Throbbing and Toothache-like Previous Examinations or Tests: MRI scan, X-rays and Nerve conduction test Previous Treatments: Pool exercises and Radiofrequency  The patient comes into the clinics today for the first time for a chronic pain management evaluation.  The patient has a history of having been seen and treated at the [facility] pain clinic by Dr. Danna Hefty, MD. Through "care everywhere" I can see that the patient has had a right-sided L3 and L4 medial branch radiofrequency ablation under fluoroscopic guidance done on May 08, 2004.  This was preceded by 2 diagnostic Lumbar facet blocks of the L3 and L4 medial branches, according to the notes, for the purpose of denervating the right L3-4 lumbar facet.  He comes into the clinic today with a referral containing notes from [facility] in [place].  The patient is currently using OxyContin 40 mg every 6 hours + Dilaudid 4 mg every 8 hours + Lyrica 75 mg every 8 hours + Ambien CR 12.5 mg at bedtime.  As stated below this  represents an MME of 304 mg/day (morphine equivalent).  This is significantly in excess of the 50-60 mg/day maximum recommended by the CDC guidelines.  In reviewing his medical records, it would seem that he has been on the same dose, nonstop, since 2013.  This is as far back as the PMP goes.  This would suggest that this patient has been on opioid analgesics since well before 2013.  In addition, he admits that he has mild stopped the opioids for any prolonged period of time, to allow for reset of the opioid receptors and elimination of his tolerance.  Because of this, he does have a considerable amount of tolerance to the analgesic effect of the opioids however, he does seem to be displaying some of the long-term effects of the use of this type of medication such as: Hypogonadism, low testosterone, opioid induced constipation, slowing of his metabolism, weight gain, drug-induced erectile dysfunction, opioid-induced mood disorder with depressive component, and insomnia type opioid-induced sleep disorder, among other things.  The notes from [facility] in [place] indicate that they offered to lower his opioid to a more manageable MME of 90 mg/day.  The note would suggest that the patient was not interested in lowering his opioids in any way, shape, or form.  Today's conversation seem to have revolved primarily around the use of opioid analgesics rather than the interventional therapies that I could offer him.  It is my impression that he is really not interested in any of those at this time.  He describes having had procedures done in the past.  Today I took the time to provide the patient with information regarding my pain practice. The patient was informed that my practice is divided into two sections: an interventional pain management section, as well as a completely separate and distinct medication management section. I explained that I have procedure days for my  interventional  therapies, and evaluation days for follow-ups and medication management. Because of the amount of documentation required during both, they are kept separated. This means that there is the possibility that he may be scheduled for a procedure on one day, and medication management the next. I have also informed him that because of staffing and facility limitations, I no longer take patients for medication management only. To illustrate the reasons for this, I gave the patient the example of surgeons, and how inappropriate it would be to refer a patient to his/her care, just to write for the post-surgical antibiotics on a surgery done by a different surgeon.   Because interventional pain management is my board-certified specialty, the patient was informed that joining my practice means that they are open to any and all interventional therapies. I made it clear that this does not mean that they will be forced to have any procedures done. What this means is that I believe interventional therapies to be essential part of the diagnosis and proper management of chronic pain conditions. Therefore, patients not interested in these interventional alternatives will be better served under the care of a different practitioner.  The patient was also made aware of my Comprehensive Pain Management Safety Guidelines where by joining my practice, they limit all of their nerve blocks and joint injections to those done by our practice, for as long as we are retained to manage their care.   Today I offered the patient a treatment plan consisting primarily of interventional therapies with the aim of decreasing his pain so that we can then move on to decrease in all of his pain medication, slowly, until we could have him come off of them completely for a.  Of no less than 2 to 3 weeks for the purpose of resetting all of his opioid receptors and eliminating his opioid tolerance.  I did provide him with information  regarding these "Drug Holidays".  He was very honest with me and indicated that he would not be interested in doing this, especially after explained to him that should we had to go back on the opioids, I would avoid going over an MME of 50 mg/day.  After I carefully explained to him what my treatment plan would consist of, he indicated not being interested in it at this time.  In view of this, we have mutually agreed to part our ways and not to establish a patient-physician relationship.  Historic Controlled Substance Pharmacotherapy Review  PMP and historical list of controlled substances: OxyContin 40 mg 4 times daily, Dilaudid 4 mg 3 times daily, Lyrica 75 mg 3 times daily, & Ambien CR 12.5 mg nightly.  Highest opioid analgesic regimen found: OxyContin 40 mg 4 times per day + Dilaudid 4 mg 4 times per day Most recent opioid analgesic: Same Current opioid analgesics: Same Highest recorded MME/day: 304 mg/day MME/day: 304 mg/day Medications: The patient did not bring the medication(s) to the appointment, as requested in our "New Patient Package" Pharmacodynamics: Desired effects: Analgesia: The patient reports >50% benefit. Reported improvement in function: The patient reports medication allows him to accomplish basic ADLs. Clinically meaningful improvement in function (CMIF): Sustained CMIF goals met Perceived effectiveness: Described as relatively effective, allowing for increase in activities of daily living (ADL) Undesirable effects: Side-effects or Adverse reactions: None reported Historical Monitoring: The patient  reports that he does not use drugs. List of all UDS Test(s): No results found. List of other Serum/Urine Drug Screening Test(s):  No results found. Historical Background  Evaluation: [place] PMP: Six (6) year initial data search conducted. Regular, uninterrupted pattern of monthly opioid refills detected. The PMP reveals that the patient has been taking OxyContin ER 40 mg every 6  hours + Dilaudid 4 mg every 6 hours, nonstop, since at least 02/16/2012. PMP NARX Score Report:  Narcotic: 530 Sedative: 492 Stimulant: 000 [place] Department of public safety, offender search: Editor, commissioning Information) Non-contributory Risk Assessment Profile: Aberrant behavior: None observed or detected today Risk factors for fatal opioid overdose: None identified today PMP NARX Overdose Risk Score: 370 Fatal overdose hazard ratio (HR): Calculation deferred Non-fatal overdose hazard ratio (HR): Calculation deferred Risk of opioid abuse or dependence: 0.7-3.0% with doses ? 36 MME/day and 6.1-26% with doses ? 120 MME/day. Substance use disorder (SUD) risk level: See below Personal History of Substance Abuse (SUD-Substance use disorder):  Alcohol: Negative  Illegal Drugs: Negative  Rx Drugs: Negative  ORT Risk Level calculation: Low Risk Opioid Risk Tool - 02/16/18 1045      Family History of Substance Abuse   Alcohol  Negative    Illegal Drugs  Negative    Rx Drugs  Negative      Personal History of Substance Abuse   Alcohol  Negative    Illegal Drugs  Negative    Rx Drugs  Negative      Age   Age between 47-45 years   No      History of Preadolescent Sexual Abuse   History of Preadolescent Sexual Abuse  Negative or Male      Psychological Disease   Psychological Disease  Negative    Depression  Positive      Total Score   Opioid Risk Tool Scoring  1    Opioid Risk Interpretation  Low Risk      ORT Scoring interpretation table:  Score <3 = Low Risk for SUD  Score between 4-7 = Moderate Risk for SUD  Score >8 = High Risk for Opioid Abuse   PHQ-2 Depression Scale:  Total score: 4  PHQ-2 Scoring interpretation table: (Score and probability of major depressive disorder)  Score 0 = No depression  Score 1 = 15.4% Probability  Score 2 = 21.1% Probability  Score 3 = 38.4% Probability  Score 4 = 45.5% Probability  Score 5 = 56.4% Probability  Score 6 = 78.6% Probability    PHQ-9 Depression Scale:  Total score: 11  PHQ-9 Scoring interpretation table:  Score 0-4 = No depression  Score 5-9 = Mild depression  Score 10-14 = Moderate depression  Score 15-19 = Moderately severe depression  Score 20-27 = Severe depression (2.4 times higher risk of SUD and 2.89 times higher risk of overuse)   Pharmacologic Plan: As per protocol, I have not taken over any controlled substance management. Initial impression: The patient indicated having no interest, at this time.  Meds   Current Outpatient Medications:  .  aspirin 81 MG chewable tablet, 81 mg once daily., Disp: , Rfl:  .  canagliflozin (INVOKANA) 300 MG TABS tablet, Take 1 tablet (300 mg total) by mouth daily before breakfast., Disp: 30 tablet, Rfl: 0 .  cyanocobalamin 100 MCG tablet, Take 100 mcg by mouth daily., Disp: , Rfl:  .  glipiZIDE (GLUCOTROL XL) 10 MG 24 hr tablet, TAKE 1 TABLET BY MOUTH ONCE DAILY., Disp: 30 tablet, Rfl: 11 .  HYDROmorphone (DILAUDID) 4 MG tablet, Take 4 mg by mouth every 6 (six) hours as needed. , Disp: , Rfl:  .  lisinopril (PRINIVIL,ZESTRIL) 10  MG tablet, TAKE 1 TABLET BY MOUTH ONCE DAILY., Disp: 30 tablet, Rfl: 11 .  lubiprostone (AMITIZA) 24 MCG capsule, Take 1 capsule (24 mcg total) by mouth 2 (two) times daily with a meal., Disp: 60 capsule, Rfl: 3 .  meloxicam (MOBIC) 7.5 MG tablet, Take 7.5 mg by mouth 2 (two) times daily., Disp: , Rfl: 2 .  metFORMIN (GLUCOPHAGE) 1000 MG tablet, TAKE 1 TABLET BY MOUTH TWICE DAILY, Disp: 60 tablet, Rfl: 6 .  oxyCODONE (OXYCONTIN) 40 mg 12 hr tablet, 40 mg 2 (two) times daily., Disp: , Rfl:  .  OXYCODONE HCL, Take 40 mg by mouth every 4 (four) hours as needed. , Disp: , Rfl:  .  pioglitazone (ACTOS) 45 MG tablet, Take 1 tablet (45 mg total) by mouth daily., Disp: 30 tablet, Rfl: 5 .  pregabalin (LYRICA) 75 MG capsule, Take by mouth., Disp: , Rfl:  .  simvastatin (ZOCOR) 40 MG tablet, Take 1 tablet (40 mg total) by mouth daily., Disp: 90 tablet,  Rfl: 4 .  testosterone cypionate (DEPOTESTOSTERONE CYPIONATE) 200 MG/ML injection, INJECT 1ML (200MG) INTO THE MUSCLE EVERY 14 DAYS, Disp: 2 mL, Rfl: 4 .  zolpidem (AMBIEN CR) 12.5 MG CR tablet, AMBIEN CR, 12.5MG (Oral Tablet Extended Release)  1 PO QHS for 0 days  Quantity: 30.00;  Refills: 0   Ordered :15-Jul-2010  Reginia Forts MD;  Started 03-Mar-2007 Active, Disp: , Rfl:   Imaging Review  Cervical Imaging: Cervical MR wo contrast:  Results for orders placed during the hospital encounter of 09/29/11  MR Cervical Spine Wo Contrast   Narrative *RADIOLOGY REPORT*  Clinical Data: Neck and left shoulder and arm pain.  MRI CERVICAL SPINE WITHOUT CONTRAST  Technique:  Multiplanar and multiecho pulse sequences of the cervical spine, to include the craniocervical junction and cervicothoracic junction, were obtained according to standard protocol without intravenous contrast.  Comparison: None  Findings: The sagittal MR images demonstrate mild reversal of the normal cervical lordosis.  The vertebral bodies are normally aligned.  Normal marrow signal except for endplate reactive changes mainly at C6-7.  The cervical spinal cord demonstrates grossly normal signal intensity despite some artifact.  No Chiari malformation.  C2-3:  No significant findings.  C3-4:  No significant findings.  C4-5:  Left foraminal disc protrusion and uncinate spurring change contributing to left foraminal stenosis likely affecting the left C5 nerve root.  C5-6:  Moderate sized central disc protrusion and osteophytic ridging with mass effect on the ventral thecal sac and near obliteration of the ventral CSF space.  Mild foraminal encroachment bilaterally also.  C6-7:  Broad-based disc protrusion asymmetric to the left.  Mass effect on the left side of the thecal sac and on the left C7 nerve root.  C7-T1:  No significant findings.  IMPRESSION:   Disc protrusions at C4-5, C5-6 and C6-7.  Original  Report Authenticated By: P. Kalman Jewels, M.D.   Complexity Note: Imaging results reviewed. Results shared with Taylor [name], using Layman's terms.                         ROS  Cardiovascular: Daily Aspirin intake and High blood pressure Pulmonary or Respiratory: Snoring  Neurological: No reported neurological signs or symptoms such as seizures, abnormal skin sensations, urinary and/or fecal incontinence, being born with an abnormal open spine and/or a tethered spinal cord Review of Past Neurological Studies: No results found for this or any previous visit. Psychological-Psychiatric: Anxiousness and Prone to panicking  Gastrointestinal: No reported gastrointestinal signs or symptoms such as vomiting or evacuating blood, reflux, heartburn, alternating episodes of diarrhea and constipation, inflamed or scarred liver, or pancreas or irrregular and/or infrequent bowel movements Genitourinary: No reported renal or genitourinary signs or symptoms such as difficulty voiding or producing urine, peeing blood, non-functioning kidney, kidney stones, difficulty emptying the bladder, difficulty controlling the flow of urine, or chronic kidney disease Hematological: No reported hematological signs or symptoms such as prolonged bleeding, low or poor functioning platelets, bruising or bleeding easily, hereditary bleeding problems, low energy levels due to low hemoglobin or being anemic Endocrine: High blood sugar requiring insulin (IDDM) Rheumatologic: No reported rheumatological signs and symptoms such as fatigue, joint pain, tenderness, swelling, redness, heat, stiffness, decreased range of motion, with or without associated rash Musculoskeletal: Negative for myasthenia gravis, muscular dystrophy, multiple sclerosis or malignant hyperthermia Work History: Disabled  Allergies  Taylor [name] has No Known Allergies.  Laboratory Chemistry  Inflammation Markers (CRP: Acute Phase) (ESR: Chronic Phase) No results  found.  Rheumatology Markers No results found.  Renal Function Markers Lab Results  Component Value Date   BUN 15 08/09/2017   CREATININE 0.93 08/09/2017   BCR 16 08/09/2017   GFRAA 103 08/09/2017   GFRNONAA 89 08/09/2017                             Hepatic Function Markers Lab Results  Component Value Date   AST 15 08/09/2017   ALT 18 08/09/2017   ALBUMIN 4.2 08/09/2017   ALKPHOS 85 08/09/2017                        Electrolytes Lab Results  Component Value Date   NA 142 08/09/2017   K 4.9 08/09/2017   CL 101 08/09/2017   CALCIUM 9.6 08/09/2017   PHOS 3.8 01/24/2015                        Neuropathy Markers Lab Results  Component Value Date   HGBA1C 8.9 (A) 12/20/2017                        Bone Pathology Markers Lab Results  Component Value Date   TESTOFREE 2.7 (L) 01/31/2018   TESTOSTERONE 199 (L) 01/31/2018                         Coagulation Parameters Lab Results  Component Value Date   PLT 217 10/13/2017                        Cardiovascular Markers Lab Results  Component Value Date   HGB 14.7 10/13/2017   HCT 45.7 10/13/2017                         Note: Lab results reviewed.  [place]  Drug: Taylor [name]  reports that he does not use drugs. Alcohol:  reports that he does not drink alcohol. Tobacco:  reports that he has never smoked. He has never used smokeless tobacco. Medical:  has a past medical history of Arthritis, Depression, Diabetes mellitus without complication ([place]), and Hypertension. Family: family history includes Diabetes in his mother and sister.  Past Surgical History:  Procedure Laterality Date  . BACK SURGERY     Active Ambulatory Problems  Diagnosis Date Noted  . Failed back surgical syndrome 11/15/2014  . Chronic low back pain (Bilateral)  11/15/2014  . Chronic pain syndrome 03/03/2007  . History of depression 03/27/2008  . Diabetes mellitus type 2, uncontrolled ([place]) 03/03/2007  . Dyspnea on exertion 11/15/2014   . Generalized hyperhidrosis 10/18/2009  . Hepatomegaly 03/27/2013  . Hypercholesteremia 03/03/2007  . Hypertension, essential, benign 03/03/2007  . Hypogonadism, male 11/15/2014  . Cervicalgia 11/15/2014  . Nocturia 11/15/2014  . Morbid obesity ([place]) 03/03/2007  . Disturbance of skin sensation 11/15/2014  . Premature beats 06/04/2007  . Seborrheic keratosis 11/15/2014  . Thoracic back pain 11/15/2014  . Fatty liver 05/07/2015  . First degree AV block 01/19/2018  . Abnormal MRI, lumbar spine (2016) 02/15/2018  . Lumbar facet arthropathy (Bilateral) 02/15/2018  . Lumbar facet hypertrophy (Bilateral) 02/15/2018  . Lumbar facet syndrome 02/15/2018  . Spondylosis without myelopathy or radiculopathy, lumbosacral region 02/15/2018  . Disorder of skeletal system 02/15/2018  . Pharmacologic therapy 02/15/2018  . Problems influencing health status 02/15/2018  . High risk medication use 02/15/2018  . Long term prescription opiate use 02/15/2018  . Opiate use (304 MME/day) 02/15/2018  . Osteoarthritis of facet joint of lumbar spine 02/15/2018  . Chronic musculoskeletal pain 02/15/2018  . Neurogenic pain 02/15/2018  . DDD (degenerative disc disease), cervical 02/15/2018  . DDD (degenerative disc disease), lumbar 02/15/2018  . Morbid obesity with BMI of 40.0-44.9, adult ([place]) 02/15/2018  . Therapeutic opioid induced constipation 02/15/2018  . Opioid-induced sleep disorder ([place]) 02/15/2018  . 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 Opioid-induced mood disorder with depressive symptoms ([place]) 02/15/2018  . Opioid-induced sexual dysfunction ([place]) 02/15/2018  . Hypotestosteronism 02/15/2018  . NSAID long-term use 02/15/2018  . Cervical foraminal stenosis (Left: C4-5) (Bilateral: C5-6) 02/15/2018  . Abnormal MRI, cervical spine (2013) 02/15/2018  . Drug-induced erectile dysfunction 02/15/2018   Resolved Ambulatory Problems     Diagnosis Date Noted  . No Resolved Ambulatory Problems   Past Medical History:  Diagnosis Date  . Arthritis   . Depression   . Diabetes mellitus without complication ([place])   . Hypertension    Constitutional Exam  General appearance: Well nourished, well developed, and well hydrated. In no apparent acute distress Vitals:   02/16/18 1020  Pulse: 88  Temp: 98.5 F (36.9 C)  SpO2: 98%  Weight: (!) 330 lb (149.7 kg)  Height: 6' (1.829 m)   BMI Assessment: Estimated body mass index is 44.76 kg/m as calculated from the following:   Height as of this encounter: 6' (1.829 m).   Weight as of this encounter: 330 lb (149.7 kg).  BMI interpretation table: BMI level Category Range association with higher incidence of chronic pain  <18 kg/m2 Underweight   18.5-24.9 kg/m2 Ideal body weight   25-29.9 kg/m2 Overweight Increased incidence by 20%  30-34.9 kg/m2 Obese (Class I) Increased incidence by 68%  35-39.9 kg/m2 Severe obesity (Class II) Increased incidence by 136%  >40 kg/m2 Extreme obesity (Class III) Increased incidence by 254%   Patient's current BMI Ideal Body weight  Body mass index is 44.76 kg/m. Ideal body weight: 77.6 kg (171 lb 1.2 oz) Adjusted ideal body weight: 106.4 kg (234 lb 10.3 oz)   BMI Readings from Last 4 Encounters:  02/16/18 44.76 kg/m  01/19/18 44.20 kg/m  12/20/17 44.33 kg/m  09/20/17 42.48 kg/m   Wt Readings from Last 4 Encounters:  02/16/18 (!) 330 lb (149.7 kg)  01/19/18 Marland Kitchen)  335 lb (152 kg)  12/20/17 (!) 336 lb (152.4 kg)  09/20/17 (!) 322 lb (146.1 kg)  Psych/Mental status: Alert, oriented x 3 (person, place, & time)       Eyes: PERLA Respiratory: No evidence of acute respiratory distress  Cervical Spine Area Exam  Skin & Axial Inspection: No masses, redness, edema, swelling, or associated skin lesions Alignment: Symmetrical Functional ROM: Decreased ROM      Stability: No instability detected Muscle Tone/Strength: Functionally intact. No  obvious neuro-muscular anomalies detected. Sensory (Neurological): Movement-associated discomfort Palpation: No palpable anomalies              Upper Extremity (UE) Exam    Side: Right upper extremity  Side: Left upper extremity  Skin & Extremity Inspection: Skin color, temperature, and hair growth are WNL. No peripheral edema or cyanosis. No masses, redness, swelling, asymmetry, or associated skin lesions. No contractures.  Skin & Extremity Inspection: Skin color, temperature, and hair growth are WNL. No peripheral edema or cyanosis. No masses, redness, swelling, asymmetry, or associated skin lesions. No contractures.  Functional ROM: Unrestricted ROM          Functional ROM: Unrestricted ROM          Muscle Tone/Strength: Functionally intact. No obvious neuro-muscular anomalies detected.  Muscle Tone/Strength: Functionally intact. No obvious neuro-muscular anomalies detected.  Sensory (Neurological): Unimpaired          Sensory (Neurological): Unimpaired          Palpation: No palpable anomalies              Palpation: No palpable anomalies              Provocative Test(s):  Phalen's test: deferred Tinel's test: deferred Apley's scratch test (touch opposite shoulder):  Action 1 (Across chest): deferred Action 2 (Overhead): deferred Action 3 (LB reach): deferred   Provocative Test(s):  Phalen's test: deferred Tinel's test: deferred Apley's scratch test (touch opposite shoulder):  Action 1 (Across chest): deferred Action 2 (Overhead): deferred Action 3 (LB reach): deferred    Thoracic Spine Area Exam  Skin & Axial Inspection: No masses, redness, or swelling Alignment: Symmetrical Functional ROM: Unrestricted ROM Stability: No instability detected Muscle Tone/Strength: Functionally intact. No obvious neuro-muscular anomalies detected. Sensory (Neurological): Unimpaired Muscle strength & Tone: No palpable anomalies  Lumbar Spine Area Exam  Skin & Axial Inspection: No masses,  redness, or swelling Alignment: Symmetrical Functional ROM: Decreased ROM       Stability: No instability detected Muscle Tone/Strength: Functionally intact. No obvious neuro-muscular anomalies detected. Sensory (Neurological): Movement-associated discomfort Palpation: No palpable anomalies       Provocative Tests: Hyperextension/rotation test: deferred today       Lumbar quadrant test (Kemp's test): deferred today       Lateral bending test: deferred today       Patrick's Maneuver: deferred today                   FABER test: deferred today                   S-I anterior distraction/compression test: deferred today         S-I lateral compression test: deferred today         S-I Thigh-thrust test: deferred today         S-I Gaenslen's test: deferred today          Gait & Posture Assessment  Ambulation: Unassisted Gait: Relatively normal for age and body  habitus Posture: WNL   Lower Extremity Exam    Side: Right lower extremity  Side: Left lower extremity  Stability: No instability observed          Stability: No instability observed          Skin & Extremity Inspection: Skin color, temperature, and hair growth are WNL. No peripheral edema or cyanosis. No masses, redness, swelling, asymmetry, or associated skin lesions. No contractures.  Skin & Extremity Inspection: Skin color, temperature, and hair growth are WNL. No peripheral edema or cyanosis. No masses, redness, swelling, asymmetry, or associated skin lesions. No contractures.  Functional ROM: Unrestricted ROM                  Functional ROM: Unrestricted ROM                  Muscle Tone/Strength: Functionally intact. No obvious neuro-muscular anomalies detected.  Muscle Tone/Strength: Functionally intact. No obvious neuro-muscular anomalies detected.  Sensory (Neurological): Unimpaired  Sensory (Neurological): Unimpaired  Palpation: No palpable anomalies  Palpation: No palpable anomalies   Assessment  Primary Diagnosis &  Pertinent Problem List: The primary encounter diagnosis was Chronic pain syndrome. Diagnoses of Chronic low back pain (Bilateral) , Failed back surgical syndrome, DDD (degenerative disc disease), lumbar, Lumbar facet arthropathy (Bilateral), Lumbar facet hypertrophy (Bilateral), Lumbar facet syndrome, Osteoarthritis of facet joint of lumbar spine, Spondylosis without myelopathy or radiculopathy, lumbosacral region, Cervicalgia, Cervical foraminal stenosis (Left: C4-5) (Bilateral: C5-6), DDD (degenerative disc disease), cervical, Chronic musculoskeletal pain, Neurogenic pain, Abnormal MRI, lumbar spine (2016), Abnormal MRI, cervical spine (2013), Opiate use (304 MME/day), Long term prescription opiate use, Pharmacologic therapy, Disorder of skeletal system, Problems influencing health status, 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  Visit Diagnosis (New problems to examiner): 1. Chronic pain syndrome   2. Chronic low back pain (Bilateral)    3. Failed back surgical syndrome   4. DDD (degenerative disc disease), lumbar   5. Lumbar facet arthropathy (Bilateral)   6. Lumbar facet hypertrophy (Bilateral)   7. Lumbar facet syndrome   8. Osteoarthritis of facet joint of lumbar spine   9. Spondylosis without myelopathy or radiculopathy, lumbosacral region   10. Cervicalgia   11. Cervical foraminal stenosis (Left: C4-5) (Bilateral: C5-6)   12. DDD (degenerative disc disease), cervical   13. Chronic musculoskeletal pain   14. Neurogenic pain   15. Abnormal MRI, lumbar spine (2016)   16. Abnormal MRI, cervical spine (2013)   17. Opiate use (304 MME/day)   18. Long term prescription opiate  use   19. Pharmacologic therapy   20. Disorder of skeletal system   21. Problems influencing health status   22. 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 Hypotestosteronism   24. Hypogonadism, male   38. Drug-induced erectile dysfunction   26. Opioid-induced mood disorder with depressive symptoms ([place])   27. History of depression   28. Opioid-induced sleep disorder ([place])   29. 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 We have not taken over the patient's controlled substance management.  Problem-specific plan: No problem-specific Assessment & Plan notes found  for this encounter.  Lab Orders  No laboratory test(s) ordered today   Imaging Orders  No imaging studies ordered today   Referral Orders  No referral(s) requested today   Procedure Orders    No procedure(s) ordered today   Pharmacotherapy (current): Medications ordered:  No orders of the defined types were placed in this encounter.  Medications administered during this visit: Taylor Filbert "[facility]" had no medications administered during this visit.   Pharmacological management options:  Opioid Analgesics: After careful consideration, the patient has decided not to take up our treatment plan.  Membrane stabilizer: I will not be taking over Taylor [name] medication regimen  Muscle relaxant: I will not be taking over Taylor [name] medication regimen  NSAID: I will not be taking over Taylor [name] medication regimen  Other analgesic(s): I will not be taking over Taylor [name] medication regimen   Interventional management options: Procedure(s) under consideration:  None at this time.   Provider-requested follow-up: No follow-ups on file.  Future Appointments  Date Time Provider [place]  04/07/2018 10:00 AM Caryn Section, Kirstie Peri,  MD [facility] None    Primary Care Physician: Birdie Sons, MD Location: [facility] Note by: Gaspar Cola, MD Date: 02/16/2018; Time: 11:24 AM

## 2018-02-15 ENCOUNTER — Telehealth: Payer: Self-pay | Admitting: Family Medicine

## 2018-02-15 DIAGNOSIS — M4802 Spinal stenosis, cervical region: Secondary | ICD-10-CM | POA: Insufficient documentation

## 2018-02-15 DIAGNOSIS — Z789 Other specified health status: Secondary | ICD-10-CM | POA: Insufficient documentation

## 2018-02-15 DIAGNOSIS — K5903 Drug induced constipation: Secondary | ICD-10-CM | POA: Insufficient documentation

## 2018-02-15 DIAGNOSIS — G8929 Other chronic pain: Secondary | ICD-10-CM | POA: Insufficient documentation

## 2018-02-15 DIAGNOSIS — M7918 Myalgia, other site: Secondary | ICD-10-CM

## 2018-02-15 DIAGNOSIS — Z6841 Body Mass Index (BMI) 40.0 and over, adult: Secondary | ICD-10-CM

## 2018-02-15 DIAGNOSIS — Z79899 Other long term (current) drug therapy: Secondary | ICD-10-CM | POA: Insufficient documentation

## 2018-02-15 DIAGNOSIS — N522 Drug-induced erectile dysfunction: Secondary | ICD-10-CM | POA: Insufficient documentation

## 2018-02-15 DIAGNOSIS — M792 Neuralgia and neuritis, unspecified: Secondary | ICD-10-CM | POA: Insufficient documentation

## 2018-02-15 DIAGNOSIS — E349 Endocrine disorder, unspecified: Secondary | ICD-10-CM | POA: Insufficient documentation

## 2018-02-15 DIAGNOSIS — M899 Disorder of bone, unspecified: Secondary | ICD-10-CM | POA: Insufficient documentation

## 2018-02-15 DIAGNOSIS — M5136 Other intervertebral disc degeneration, lumbar region: Secondary | ICD-10-CM | POA: Insufficient documentation

## 2018-02-15 DIAGNOSIS — F11982 Opioid use, unspecified with opioid-induced sleep disorder: Secondary | ICD-10-CM | POA: Insufficient documentation

## 2018-02-15 DIAGNOSIS — F11981 Opioid use, unspecified with opioid-induced sexual dysfunction: Secondary | ICD-10-CM | POA: Insufficient documentation

## 2018-02-15 DIAGNOSIS — F11282 Opioid dependence with opioid-induced sleep disorder: Secondary | ICD-10-CM | POA: Insufficient documentation

## 2018-02-15 DIAGNOSIS — E1165 Type 2 diabetes mellitus with hyperglycemia: Secondary | ICD-10-CM

## 2018-02-15 DIAGNOSIS — M47816 Spondylosis without myelopathy or radiculopathy, lumbar region: Secondary | ICD-10-CM | POA: Insufficient documentation

## 2018-02-15 DIAGNOSIS — R937 Abnormal findings on diagnostic imaging of other parts of musculoskeletal system: Secondary | ICD-10-CM | POA: Insufficient documentation

## 2018-02-15 DIAGNOSIS — F1194 Opioid use, unspecified with opioid-induced mood disorder: Secondary | ICD-10-CM | POA: Insufficient documentation

## 2018-02-15 DIAGNOSIS — M47817 Spondylosis without myelopathy or radiculopathy, lumbosacral region: Secondary | ICD-10-CM | POA: Insufficient documentation

## 2018-02-15 DIAGNOSIS — M5030 Other cervical disc degeneration, unspecified cervical region: Secondary | ICD-10-CM | POA: Insufficient documentation

## 2018-02-15 DIAGNOSIS — Z79891 Long term (current) use of opiate analgesic: Secondary | ICD-10-CM | POA: Insufficient documentation

## 2018-02-15 DIAGNOSIS — F1190 Opioid use, unspecified, uncomplicated: Secondary | ICD-10-CM | POA: Insufficient documentation

## 2018-02-15 DIAGNOSIS — Z791 Long term (current) use of non-steroidal anti-inflammatories (NSAID): Secondary | ICD-10-CM | POA: Insufficient documentation

## 2018-02-15 DIAGNOSIS — T402X5A Adverse effect of other opioids, initial encounter: Secondary | ICD-10-CM

## 2018-02-15 NOTE — Patient Instructions (Signed)
____________________________________________________________________________________________  Pain Scale  Introduction: The pain score used by this practice is the Verbal Numerical Rating Scale (VNRS-11). This is an 11-point scale. It is for adults and children 10 years or older. There are significant differences in how the pain score is reported, used, and applied. Forget everything you learned in the past and learn this scoring system.  General Information: The scale should reflect your current level of pain. Unless you are specifically asked for the level of your worst pain, or your average pain. If you are asked for one of these two, then it should be understood that it is over the past 24 hours.  Basic Activities of Daily Living (ADL): Personal hygiene, dressing, eating, transferring, and using restroom.  Instructions: Most patients tend to report their level of pain as a combination of two factors, their physical pain and their psychosocial pain. This last one is also known as "suffering" and it is reflection of how physical pain affects you socially and psychologically. From now on, report them separately. From this point on, when asked to report your pain level, report only your physical pain. Use the following table for reference.  Pain Clinic Pain Levels (0-5/10)  Pain Level Score  Description  No Pain 0   Mild pain 1 Nagging, annoying, but does not interfere with basic activities of daily living (ADL). Patients are able to eat, bathe, get dressed, toileting (being able to get on and off the toilet and perform personal hygiene functions), transfer (move in and out of bed or a chair without assistance), and maintain continence (able to control bladder and bowel functions). Blood pressure and heart rate are unaffected. A normal heart rate for a healthy adult ranges from 60 to 100 bpm (beats per minute).   Mild to moderate pain 2 Noticeable and distracting. Impossible to hide from other  people. More frequent flare-ups. Still possible to adapt and function close to normal. It can be very annoying and may have occasional stronger flare-ups. With discipline, patients may get used to it and adapt.   Moderate pain 3 Interferes significantly with activities of daily living (ADL). It becomes difficult to feed, bathe, get dressed, get on and off the toilet or to perform personal hygiene functions. Difficult to get in and out of bed or a chair without assistance. Very distracting. With effort, it can be ignored when deeply involved in activities.   Moderately severe pain 4 Impossible to ignore for more than a few minutes. With effort, patients may still be able to manage work or participate in some social activities. Very difficult to concentrate. Signs of autonomic nervous system discharge are evident: dilated pupils (mydriasis); mild sweating (diaphoresis); sleep interference. Heart rate becomes elevated (>115 bpm). Diastolic blood pressure (lower number) rises above 100 mmHg. Patients find relief in laying down and not moving.   Severe pain 5 Intense and extremely unpleasant. Associated with frowning face and frequent crying. Pain overwhelms the senses.  Ability to do any activity or maintain social relationships becomes significantly limited. Conversation becomes difficult. Pacing back and forth is common, as getting into a comfortable position is nearly impossible. Pain wakes you up from deep sleep. Physical signs will be obvious: pupillary dilation; increased sweating; goosebumps; brisk reflexes; cold, clammy hands and feet; nausea, vomiting or dry heaves; loss of appetite; significant sleep disturbance with inability to fall asleep or to remain asleep. When persistent, significant weight loss is observed due to the complete loss of appetite and sleep deprivation.  Blood   pressure and heart rate becomes significantly elevated. Caution: If elevated blood pressure triggers a pounding headache  associated with blurred vision, then the patient should immediately seek attention at an urgent or emergency care unit, as these may be signs of an impending stroke.    Emergency Department Pain Levels (6-10/10)  Emergency Room Pain 6 Severely limiting. Requires emergency care and should not be seen or managed at an outpatient pain management facility. Communication becomes difficult and requires great effort. Assistance to reach the emergency department may be required. Facial flushing and profuse sweating along with potentially dangerous increases in heart rate and blood pressure will be evident.   Distressing pain 7 Self-care is very difficult. Assistance is required to transport, or use restroom. Assistance to reach the emergency department will be required. Tasks requiring coordination, such as bathing and getting dressed become very difficult.   Disabling pain 8 Self-care is no longer possible. At this level, pain is disabling. The individual is unable to do even the most "basic" activities such as walking, eating, bathing, dressing, transferring to a bed, or toileting. Fine motor skills are lost. It is difficult to think clearly.   Incapacitating pain 9 Pain becomes incapacitating. Thought processing is no longer possible. Difficult to remember your own name. Control of movement and coordination are lost.   The worst pain imaginable 10 At this level, most patients pass out from pain. When this level is reached, collapse of the autonomic nervous system occurs, leading to a sudden drop in blood pressure and heart rate. This in turn results in a temporary and dramatic drop in blood flow to the brain, leading to a loss of consciousness. Fainting is one of the body's self defense mechanisms. Passing out puts the brain in a calmed state and causes it to shut down for a while, in order to begin the healing process.    Summary: 1. Refer to this scale when providing us with your pain level. 2. Be  accurate and careful when reporting your pain level. This will help with your care. 3. Over-reporting your pain level will lead to loss of credibility. 4. Even a level of 1/10 means that there is pain and will be treated at our facility. 5. High, inaccurate reporting will be documented as "Symptom Exaggeration", leading to loss of credibility and suspicions of possible secondary gains such as obtaining more narcotics, or wanting to appear disabled, for fraudulent reasons. 6. Only pain levels of 5 or below will be seen at our facility. 7. Pain levels of 6 and above will be sent to the Emergency Department and the appointment cancelled. ____________________________________________________________________________________________   ____________________________________________________________________________________________  Drug Holidays (Slow)  What is a "Drug Holiday"? Drug Holiday: is the name given to the period of time during which a patient stops taking a medication(s) for the purpose of eliminating tolerance to the drug.  Benefits . Improved effectiveness of opioids. . Decreased opioid dose needed to achieve benefits. . Improved pain with lesser dose.  What is tolerance? Tolerance: is the progressive decreased in effectiveness of a drug due to its repetitive use. With repetitive use, the body gets use to the medication and as a consequence, it loses its effectiveness. This is a common problem seen with opioid pain medications. As a result, a larger dose of the drug is needed to achieve the same effect that used to be obtained with a smaller dose.  How long should a "Drug Holiday" last? At least 14 consecutive days. (2 weeks)  What   are withdrawals? Withdrawals: refers to the wide range of symptoms that occur after stopping or dramatically reducing opiate drugs after heavy and prolonged use. Withdrawal symptoms do not occur to patients that use low dose opioids, or those who take the  medication sporadically. Contrary to benzodiazepine (example: Valium, Xanax, etc.) or alcohol withdrawals ("Delirium Tremens"), opioid withdrawals are not lethal. Withdrawals are the physical manifestation of the body getting rid of the excess receptors.  Expected Symptoms Early symptoms of withdrawal may include: . Agitation . Anxiety . Muscle aches . Increased tearing . Insomnia . Runny nose . Sweating . Yawning  Late symptoms of withdrawal may include: . Abdominal cramping . Diarrhea . Dilated pupils . Goose bumps . Nausea . Vomiting  Will I experience withdrawals? Due to the slow nature of the taper, it is very unlikely that you will experience any.  What is a slow taper? Taper: refers to the gradual decrease in dose. ___________________________________________________________________________________________   

## 2018-02-15 NOTE — Telephone Encounter (Signed)
Please advise samples? 

## 2018-02-15 NOTE — Telephone Encounter (Signed)
Pt is requesting samples of canagliflozin (INVOKANA) 300 MG TABS tablet. Please advise. Thanks TNP

## 2018-02-15 NOTE — Telephone Encounter (Signed)
He can have 6 bottles of Invokana 600

## 2018-02-16 ENCOUNTER — Encounter: Payer: Self-pay | Admitting: Pain Medicine

## 2018-02-16 ENCOUNTER — Ambulatory Visit: Payer: Worker's Compensation | Attending: Pain Medicine | Admitting: Pain Medicine

## 2018-02-16 ENCOUNTER — Telehealth: Payer: Self-pay | Admitting: *Deleted

## 2018-02-16 ENCOUNTER — Other Ambulatory Visit: Payer: Self-pay

## 2018-02-16 VITALS — HR 88 | Temp 98.5°F | Ht 72.0 in | Wt 330.0 lb

## 2018-02-16 DIAGNOSIS — M899 Disorder of bone, unspecified: Secondary | ICD-10-CM

## 2018-02-16 DIAGNOSIS — Z791 Long term (current) use of non-steroidal anti-inflammatories (NSAID): Secondary | ICD-10-CM | POA: Insufficient documentation

## 2018-02-16 DIAGNOSIS — F1194 Opioid use, unspecified with opioid-induced mood disorder: Secondary | ICD-10-CM

## 2018-02-16 DIAGNOSIS — K760 Fatty (change of) liver, not elsewhere classified: Secondary | ICD-10-CM | POA: Insufficient documentation

## 2018-02-16 DIAGNOSIS — M8938 Hypertrophy of bone, other site: Secondary | ICD-10-CM | POA: Insufficient documentation

## 2018-02-16 DIAGNOSIS — M7918 Myalgia, other site: Secondary | ICD-10-CM | POA: Diagnosis not present

## 2018-02-16 DIAGNOSIS — E1165 Type 2 diabetes mellitus with hyperglycemia: Secondary | ICD-10-CM | POA: Diagnosis not present

## 2018-02-16 DIAGNOSIS — G894 Chronic pain syndrome: Secondary | ICD-10-CM

## 2018-02-16 DIAGNOSIS — M4802 Spinal stenosis, cervical region: Secondary | ICD-10-CM

## 2018-02-16 DIAGNOSIS — Z79899 Other long term (current) drug therapy: Secondary | ICD-10-CM

## 2018-02-16 DIAGNOSIS — G8929 Other chronic pain: Secondary | ICD-10-CM

## 2018-02-16 DIAGNOSIS — M1288 Other specific arthropathies, not elsewhere classified, other specified site: Secondary | ICD-10-CM | POA: Insufficient documentation

## 2018-02-16 DIAGNOSIS — Z6841 Body Mass Index (BMI) 40.0 and over, adult: Secondary | ICD-10-CM | POA: Diagnosis not present

## 2018-02-16 DIAGNOSIS — Z79891 Long term (current) use of opiate analgesic: Secondary | ICD-10-CM | POA: Diagnosis not present

## 2018-02-16 DIAGNOSIS — I10 Essential (primary) hypertension: Secondary | ICD-10-CM | POA: Diagnosis not present

## 2018-02-16 DIAGNOSIS — Z7982 Long term (current) use of aspirin: Secondary | ICD-10-CM | POA: Insufficient documentation

## 2018-02-16 DIAGNOSIS — M5136 Other intervertebral disc degeneration, lumbar region: Secondary | ICD-10-CM | POA: Diagnosis not present

## 2018-02-16 DIAGNOSIS — M545 Low back pain, unspecified: Secondary | ICD-10-CM

## 2018-02-16 DIAGNOSIS — M47897 Other spondylosis, lumbosacral region: Secondary | ICD-10-CM | POA: Insufficient documentation

## 2018-02-16 DIAGNOSIS — R61 Generalized hyperhidrosis: Secondary | ICD-10-CM | POA: Insufficient documentation

## 2018-02-16 DIAGNOSIS — M961 Postlaminectomy syndrome, not elsewhere classified: Secondary | ICD-10-CM | POA: Diagnosis not present

## 2018-02-16 DIAGNOSIS — M51369 Other intervertebral disc degeneration, lumbar region without mention of lumbar back pain or lower extremity pain: Secondary | ICD-10-CM

## 2018-02-16 DIAGNOSIS — E291 Testicular hypofunction: Secondary | ICD-10-CM

## 2018-02-16 DIAGNOSIS — Z5181 Encounter for therapeutic drug level monitoring: Secondary | ICD-10-CM | POA: Insufficient documentation

## 2018-02-16 DIAGNOSIS — M792 Neuralgia and neuritis, unspecified: Secondary | ICD-10-CM

## 2018-02-16 DIAGNOSIS — N522 Drug-induced erectile dysfunction: Secondary | ICD-10-CM | POA: Diagnosis not present

## 2018-02-16 DIAGNOSIS — Z789 Other specified health status: Secondary | ICD-10-CM

## 2018-02-16 DIAGNOSIS — R160 Hepatomegaly, not elsewhere classified: Secondary | ICD-10-CM | POA: Insufficient documentation

## 2018-02-16 DIAGNOSIS — M5030 Other cervical disc degeneration, unspecified cervical region: Secondary | ICD-10-CM | POA: Diagnosis not present

## 2018-02-16 DIAGNOSIS — M542 Cervicalgia: Secondary | ICD-10-CM

## 2018-02-16 DIAGNOSIS — Z833 Family history of diabetes mellitus: Secondary | ICD-10-CM | POA: Insufficient documentation

## 2018-02-16 DIAGNOSIS — R937 Abnormal findings on diagnostic imaging of other parts of musculoskeletal system: Secondary | ICD-10-CM

## 2018-02-16 DIAGNOSIS — E349 Endocrine disorder, unspecified: Secondary | ICD-10-CM

## 2018-02-16 DIAGNOSIS — Z7984 Long term (current) use of oral hypoglycemic drugs: Secondary | ICD-10-CM | POA: Insufficient documentation

## 2018-02-16 DIAGNOSIS — F11982 Opioid use, unspecified with opioid-induced sleep disorder: Secondary | ICD-10-CM

## 2018-02-16 DIAGNOSIS — F1190 Opioid use, unspecified, uncomplicated: Secondary | ICD-10-CM

## 2018-02-16 DIAGNOSIS — F11282 Opioid dependence with opioid-induced sleep disorder: Secondary | ICD-10-CM

## 2018-02-16 DIAGNOSIS — F11981 Opioid use, unspecified with opioid-induced sexual dysfunction: Secondary | ICD-10-CM

## 2018-02-16 DIAGNOSIS — R209 Unspecified disturbances of skin sensation: Secondary | ICD-10-CM | POA: Insufficient documentation

## 2018-02-16 DIAGNOSIS — I440 Atrioventricular block, first degree: Secondary | ICD-10-CM | POA: Insufficient documentation

## 2018-02-16 DIAGNOSIS — E7800 Pure hypercholesterolemia, unspecified: Secondary | ICD-10-CM | POA: Diagnosis not present

## 2018-02-16 DIAGNOSIS — M47817 Spondylosis without myelopathy or radiculopathy, lumbosacral region: Secondary | ICD-10-CM

## 2018-02-16 DIAGNOSIS — M47816 Spondylosis without myelopathy or radiculopathy, lumbar region: Secondary | ICD-10-CM

## 2018-02-16 DIAGNOSIS — M9981 Other biomechanical lesions of cervical region: Secondary | ICD-10-CM

## 2018-02-16 DIAGNOSIS — Z8659 Personal history of other mental and behavioral disorders: Secondary | ICD-10-CM

## 2018-02-16 DIAGNOSIS — L821 Other seborrheic keratosis: Secondary | ICD-10-CM | POA: Insufficient documentation

## 2018-02-16 DIAGNOSIS — F329 Major depressive disorder, single episode, unspecified: Secondary | ICD-10-CM | POA: Diagnosis not present

## 2018-02-16 MED ORDER — CANAGLIFLOZIN 300 MG PO TABS: 300.0000 mg | ORAL_TABLET | Freq: Every day | ORAL | 0 refills | Status: DC

## 2018-02-16 NOTE — Telephone Encounter (Signed)
Samples at front desk for pick up. Patient advised.

## 2018-02-16 NOTE — Telephone Encounter (Signed)
Patient wanted clarification that you want him to take testosterone injections once weekly, as advised 02/01/2018. Patient states he does not want to take injections once a week. Please advise?

## 2018-02-16 NOTE — Telephone Encounter (Signed)
He could take 2 ml every two weeks instead, which would have the same effect. If he really doesn't want to go up on the dose, he could continue 1 ml every 2 weeks. Testosterone levels might build up a little more over time. But I think he would feel better with higher dose. It's up to him.

## 2018-02-17 NOTE — Telephone Encounter (Signed)
No answer and no vm. Will try again later.  

## 2018-02-17 NOTE — Telephone Encounter (Signed)
Left message to call back  

## 2018-02-22 NOTE — Telephone Encounter (Signed)
Patient reports he will take 2 ml every two weeks.

## 2018-03-22 ENCOUNTER — Other Ambulatory Visit: Payer: Self-pay | Admitting: Family Medicine

## 2018-03-22 ENCOUNTER — Telehealth: Payer: Self-pay | Admitting: Family Medicine

## 2018-03-22 DIAGNOSIS — E1165 Type 2 diabetes mellitus with hyperglycemia: Principal | ICD-10-CM

## 2018-03-22 DIAGNOSIS — IMO0001 Reserved for inherently not codable concepts without codable children: Secondary | ICD-10-CM

## 2018-03-22 NOTE — Telephone Encounter (Signed)
Pt calling to see if there are free samples of:  canagliflozin (INVOKANA) 300 MG TABS tablet Please call pt know when he can come by to pick them up if available.  Thanks, [employer]

## 2018-03-22 NOTE — Telephone Encounter (Signed)
Left two bottles (that was all that was left) at the front desk.  Tried calling pt but was unable to leave a message.   Thanks,   -Vernona Rieger

## 2018-03-22 NOTE — Telephone Encounter (Signed)
Can have three bottles if we have them

## 2018-03-23 NOTE — Telephone Encounter (Signed)
Tried calling patient. Left message to call back. 

## 2018-03-25 NOTE — Telephone Encounter (Signed)
Advised patient as below.  

## 2018-04-01 DIAGNOSIS — E119 Type 2 diabetes mellitus without complications: Secondary | ICD-10-CM | POA: Diagnosis not present

## 2018-04-01 LAB — HM DIABETES EYE EXAM

## 2018-04-07 ENCOUNTER — Encounter: Payer: Self-pay | Admitting: Family Medicine

## 2018-04-07 ENCOUNTER — Ambulatory Visit (INDEPENDENT_AMBULATORY_CARE_PROVIDER_SITE_OTHER): Payer: Medicare Other | Admitting: Family Medicine

## 2018-04-07 VITALS — BP 132/88 | HR 88 | Temp 98.4°F | Wt 339.2 lb

## 2018-04-07 DIAGNOSIS — E7800 Pure hypercholesterolemia, unspecified: Secondary | ICD-10-CM

## 2018-04-07 DIAGNOSIS — E349 Endocrine disorder, unspecified: Secondary | ICD-10-CM

## 2018-04-07 DIAGNOSIS — E1165 Type 2 diabetes mellitus with hyperglycemia: Secondary | ICD-10-CM | POA: Diagnosis not present

## 2018-04-07 DIAGNOSIS — I10 Essential (primary) hypertension: Secondary | ICD-10-CM

## 2018-04-07 LAB — POCT GLYCOSYLATED HEMOGLOBIN (HGB A1C)
ESTIMATED AVERAGE GLUCOSE: 217
Hemoglobin A1C: 9.2 % — AB (ref 4.0–5.6)

## 2018-04-07 NOTE — Progress Notes (Signed)
Patient: Taylor [name] Male    DOB: Jan 03, 1956   62 y.o.   MRN: [id number] Visit Date: 04/07/2018  Today's Provider: Mila Merry, MD   Chief Complaint  Patient presents with  . Diabetes   Subjective:    HPI  Diabetes Mellitus Type II, Follow-up:   Lab Results  Component Value Date   HGBA1C 8.9 (A) 12/20/2017   HGBA1C 9.3 09/20/2017   HGBA1C 9.6 07/01/2017    Last seen for diabetes 3 months ago.  Management since then includes none. He reports good compliance with treatment. Although he missed about 3 days of medication last week when he ran out.  He is not having side effects.  Current symptoms include paresthesia of the feet and have been unchanged. Home blood sugar records: fasting range: 150-200  Episodes of hypoglycemia? no   Current Insulin Regimen: n/a Most Recent Eye Exam: 04/01/2018 Weight trend: stable Prior visit with dietician: no Current diet: in general, a "healthy" diet   Current exercise: none  Pertinent Labs:    Component Value Date/Time   CHOL 216 (H) 10/13/2017 1157   TRIG 191 (H) 10/13/2017 1157   HDL 53 10/13/2017 1157   LDLCALC 125 (H) 10/13/2017 1157   CREATININE 0.93 08/09/2017 0923    Wt Readings from Last 3 Encounters:  04/07/18 (!) 339 lb 3.2 oz (153.9 kg)  02/16/18 (!) 330 lb (149.7 kg)  01/19/18 (!) 335 lb (152 kg)   ---------------------------------------------------------------------    Hypogonadism, male From 12/20/2017-counseled to get back on constant schedule for testosterone injections. He states he did take injection Tuesday morning and has been taking mostly on schedule since last month. He does admit to having to urinate frequently at night and has some issues with hesitancy.  He states he thought the he was supposed to be using 2ml of testosterone, but he was prescribed 200mg /mL and thinks he has only been getting about 1.10ml in the syringe. But is running out testosterone early. He states he does feel  like he has more energy for a few days after injection.    Lipid/Cholesterol, Follow-up:   Last seen for this6 months ago.  Management changes since that visit include getting patient to take simvastatin consistently every day and improving diet. . . Last Lipid Panel:    Component Value Date/Time   CHOL 216 (H) 10/13/2017 1157   TRIG 191 (H) 10/13/2017 1157   HDL 53 10/13/2017 1157   CHOLHDL 4.1 10/13/2017 1157   LDLCALC 125 (H) 10/13/2017 1157     He reports excellent compliance with treatment. He is not having side effects.  Current symptoms include none  Weight trend: increasing steadily Prior visit with dietician: no   Wt Readings from Last 3 Encounters:  04/07/18 (!) 339 lb 3.2 oz (153.9 kg)  02/16/18 (!) 330 lb (149.7 kg)  01/19/18 (!) 335 lb (152 kg)    -------------------------------------------------------------------   No Known Allergies   Current Outpatient Medications:  .  aspirin 81 MG chewable tablet, 81 mg once daily., Disp: , Rfl:  .  canagliflozin (INVOKANA) 300 MG TABS tablet, Take 1 tablet (300 mg total) by mouth daily before breakfast., Disp: 30 tablet, Rfl: 0 .  cyanocobalamin 100 MCG tablet, Take 100 mcg by mouth daily., Disp: , Rfl:  .  glipiZIDE (GLUCOTROL XL) 10 MG 24 hr tablet, TAKE 1 TABLET BY MOUTH ONCE DAILY., Disp: 30 tablet, Rfl: 11 .  HYDROmorphone (DILAUDID) 4 MG tablet, Take 4 mg by  mouth every 6 (six) hours as needed. , Disp: , Rfl:  .  lisinopril (PRINIVIL,ZESTRIL) 10 MG tablet, TAKE 1 TABLET BY MOUTH ONCE DAILY., Disp: 30 tablet, Rfl: 11 .  lubiprostone (AMITIZA) 24 MCG capsule, Take 1 capsule (24 mcg total) by mouth 2 (two) times daily with a meal., Disp: 60 capsule, Rfl: 3 .  meloxicam (MOBIC) 7.5 MG tablet, Take 7.5 mg by mouth 2 (two) times daily., Disp: , Rfl: 2 .  metFORMIN (GLUCOPHAGE) 1000 MG tablet, TAKE 1 TABLET BY MOUTH TWICE DAILY, Disp: 60 tablet, Rfl: 12 .  oxyCODONE (OXYCONTIN) 40 mg 12 hr tablet, 40 mg 2 (two) times  daily., Disp: , Rfl:  .  OXYCODONE HCL, Take 40 mg by mouth every 4 (four) hours as needed. , Disp: , Rfl:  .  pioglitazone (ACTOS) 45 MG tablet, Take 1 tablet (45 mg total) by mouth daily., Disp: 30 tablet, Rfl: 5 .  pregabalin (LYRICA) 75 MG capsule, Take by mouth., Disp: , Rfl:  .  simvastatin (ZOCOR) 40 MG tablet, Take 1 tablet (40 mg total) by mouth daily., Disp: 90 tablet, Rfl: 4 .  testosterone cypionate (DEPOTESTOSTERONE CYPIONATE) 200 MG/ML injection, INJECT (200MG ) INTO THE MUSCLE EVERY 14 DAYS, Disp: 2 mL, Rfl: 4 .  zolpidem (AMBIEN CR) 12.5 MG CR tablet, AMBIEN CR, 12.5MG  (Oral Tablet Extended Release)  1 PO QHS for 0 days  Quantity: 30.00;  Refills: 0   Ordered :15-Jul-2010  Nilda Simmer MD;  Started 03-Mar-2007 Active, Disp: , Rfl:   Review of Systems  Constitutional: Negative for appetite change, chills and fever.  Respiratory: Negative for chest tightness, shortness of breath and wheezing.   Cardiovascular: Negative for chest pain and palpitations.  Gastrointestinal: Negative for abdominal pain, nausea and vomiting.    Social History   Tobacco Use  . Smoking status: Never Smoker  . Smokeless tobacco: Never Used  Substance Use Topics  . Alcohol use: No    Alcohol/week: 0.0 standard drinks   Objective:   BP 132/88 (BP Location: Left Arm, Patient Position: Sitting, Cuff Size: Normal)   Pulse 88   Temp 98.4 F (36.9 C) (Oral)   Wt (!) 339 lb 3.2 oz (153.9 kg)   SpO2 94%   BMI 46.00 kg/m  Vitals:   04/07/18 1015  BP: 132/88  Pulse: 88  Temp: 98.4 F (36.9 C)  TempSrc: Oral  SpO2: 94%  Weight: (!) 339 lb 3.2 oz (153.9 kg)     Physical Exam    General Appearance:    Alert, cooperative, no distress, obese  Eyes:    PERRL, conjunctiva/corneas clear, EOM's intact       Lungs:     Clear to auscultation bilaterally, respirations unlabored  Heart:    Regular rate and rhythm  Neurologic:   Awake, alert, oriented x 3. No apparent focal neurological            defect.        Results for orders placed or performed in visit on 04/07/18  POCT glycosylated hemoglobin (Hb A1C)  Result Value Ref Range   Hemoglobin A1C 9.2 (A) 4.0 - 5.6 %   HbA1c POC (<> result, manual entry)     HbA1c, POC (prediabetic range)     HbA1c, POC (controlled diabetic range)     Est. average glucose Bld gHb Est-mCnc 217        Assessment & Plan:     1. Uncontrolled type 2 diabetes mellitus with hyperglycemia (HCC) Worsening A1c,  complicated by limited mobility and exercise and trouble with cost of diabetic medications.  - POCT glycosylated hemoglobin (Hb A1C)  2. Hypertension, essential, benign .stable. Continue current medications.    3. Hypercholesteremia Now taking statin consistently - Lipid panel - TSH  4. Hypotestosteronism Feels a little better taking about 1 1/2 ml of 200mg /ml testosterone every 2 weeks. Will change to 65ml vials with next rf.  - Testosterone,Free and Total       Mila Merry, MD  [facility]

## 2018-04-08 ENCOUNTER — Other Ambulatory Visit: Payer: Self-pay | Admitting: Family Medicine

## 2018-04-08 DIAGNOSIS — E1165 Type 2 diabetes mellitus with hyperglycemia: Secondary | ICD-10-CM

## 2018-04-09 LAB — LIPID PANEL
Chol/HDL Ratio: 3.7 ratio (ref 0.0–5.0)
Cholesterol, Total: 157 mg/dL (ref 100–199)
HDL: 43 mg/dL (ref >39)
LDL Calculated: 92 mg/dL (ref 0–99)
Triglycerides: 111 mg/dL (ref 0–149)
VLDL CHOLESTEROL CAL: 22 mg/dL (ref 5–40)

## 2018-04-09 LAB — TSH: TSH: 3.61 u[IU]/mL (ref 0.450–4.500)

## 2018-04-09 LAB — TESTOSTERONE,FREE AND TOTAL
TESTOSTERONE FREE: 1.2 pg/mL — AB (ref 6.6–18.1)
Testosterone: 121 ng/dL — ABNORMAL LOW (ref 264–916)

## 2018-04-12 ENCOUNTER — Telehealth: Payer: Self-pay

## 2018-04-12 ENCOUNTER — Other Ambulatory Visit: Payer: Self-pay | Admitting: Family Medicine

## 2018-04-12 DIAGNOSIS — E291 Testicular hypofunction: Secondary | ICD-10-CM

## 2018-04-12 MED ORDER — TESTOSTERONE CYPIONATE 200 MG/ML IM SOLN: INTRAMUSCULAR | 1 refills | Status: DC

## 2018-04-12 NOTE — Telephone Encounter (Signed)
Pt advised.   Thanks,   -[name] Gotham  

## 2018-04-12 NOTE — Telephone Encounter (Signed)
-----   Message from Malva Limes, MD sent at 04/12/2018  8:14 AM EST ----- Testosterone levels are still low. Increase to 66ml of testosterone every 14 days. Have sent new prescription for 25ml vial which should last for 10 weeks.  Cholesterol is much better, down from 216 to 157. Continue same dose of simvastatin.  Follow up in 3 months.

## 2018-04-29 ENCOUNTER — Other Ambulatory Visit: Payer: Self-pay | Admitting: Family Medicine

## 2018-04-29 DIAGNOSIS — E1165 Type 2 diabetes mellitus with hyperglycemia: Secondary | ICD-10-CM

## 2018-04-29 NOTE — Telephone Encounter (Signed)
canagliflozin (INVOKANA) 300 MG TABS tablet - pt will be out by [facility].  Pt needing more samples.  Please call pt to let him know when he can come by to pick them up.  Thanks, [employer]

## 2018-04-29 NOTE — Telephone Encounter (Signed)
We have 100mg . Can have 6 bottles, but needs to take 3 tablets daily. Will hopefull get some 300mg  tablets in before they run out.

## 2018-04-29 NOTE — Telephone Encounter (Signed)
LMTCB 04/29/2018  Thanks,   -Oval Cavazos  

## 2018-04-29 NOTE — Telephone Encounter (Signed)
Is it okay to give samples?  Thanks,   -Laura  

## 2018-05-02 MED ORDER — CANAGLIFLOZIN 100 MG PO TABS: 300.0000 mg | ORAL_TABLET | Freq: Every day | ORAL | 0 refills | Status: DC

## 2018-05-02 NOTE — Telephone Encounter (Signed)
Patient advised. Samples left up front.

## 2018-05-13 ENCOUNTER — Telehealth: Payer: Self-pay

## 2018-05-13 NOTE — Telephone Encounter (Signed)
We do not have any Invokana samples at this time. LMOVM for pt to return call.

## 2018-05-13 NOTE — Telephone Encounter (Signed)
Is it okay to give samples?  Thanks,   -Laura  

## 2018-05-13 NOTE — Telephone Encounter (Signed)
He can have one months samples if we have them.  

## 2018-05-13 NOTE — Telephone Encounter (Signed)
Patient states he needs samples of Invokana Call back number is [phone]

## 2018-05-19 ENCOUNTER — Telehealth: Payer: Self-pay | Admitting: Family Medicine

## 2018-05-19 ENCOUNTER — Other Ambulatory Visit: Payer: Self-pay | Admitting: Family Medicine

## 2018-05-19 DIAGNOSIS — E1165 Type 2 diabetes mellitus with hyperglycemia: Secondary | ICD-10-CM

## 2018-05-19 MED ORDER — CANAGLIFLOZIN 300 MG PO TABS: 300.0000 mg | ORAL_TABLET | Freq: Every day | ORAL | 0 refills | Status: DC

## 2018-05-19 NOTE — Telephone Encounter (Signed)
He can have 4 sample bottles.

## 2018-05-19 NOTE — Telephone Encounter (Signed)
Please advise 

## 2018-05-19 NOTE — Telephone Encounter (Signed)
Pt requesting samples of canagliflozin (INVOKANA) 300 MG TABS tablet.

## 2018-05-19 NOTE — Telephone Encounter (Signed)
Left detailed message on pt's phone that w have samples of invokana 300 mg ready for pick up at front desk.  dbs

## 2018-06-13 ENCOUNTER — Other Ambulatory Visit: Payer: Self-pay | Admitting: Family Medicine

## 2018-06-13 NOTE — Telephone Encounter (Signed)
Pt needing to know if there are anymore samples left on: canagliflozin (INVOKANA) 300 MG TABS tablet  Please let pt know.  Thanks, [employer]

## 2018-06-13 NOTE — Telephone Encounter (Signed)
Can have3 sample bottles if we have them.

## 2018-06-14 NOTE — Telephone Encounter (Signed)
Samples at the front desk.  Left message advising pt.   Thanks,   -Laura  

## 2018-06-16 ENCOUNTER — Other Ambulatory Visit: Payer: Self-pay | Admitting: Family Medicine

## 2018-06-16 DIAGNOSIS — I10 Essential (primary) hypertension: Secondary | ICD-10-CM

## 2018-06-29 ENCOUNTER — Telehealth: Payer: Self-pay | Admitting: Family Medicine

## 2018-06-29 NOTE — Telephone Encounter (Signed)
He can have 2 bottles of the 300mg  tablets.

## 2018-06-29 NOTE — Telephone Encounter (Signed)
Patient is asking for samples of Invokana

## 2018-06-29 NOTE — Telephone Encounter (Signed)
Please advise if ok to give samples. We currently have two bottles of Invokana 300mg  tablets, and three bottles of Invokana 100mg  tablets.

## 2018-06-29 NOTE — Telephone Encounter (Signed)
Patient advised sample left up front for pick up. KW

## 2018-07-07 ENCOUNTER — Encounter: Payer: Self-pay | Admitting: Family Medicine

## 2018-07-07 ENCOUNTER — Ambulatory Visit (INDEPENDENT_AMBULATORY_CARE_PROVIDER_SITE_OTHER): Payer: Medicare Other | Admitting: Family Medicine

## 2018-07-07 VITALS — BP 138/82 | HR 90 | Temp 97.9°F | Resp 16 | Wt 333.0 lb

## 2018-07-07 DIAGNOSIS — R3911 Hesitancy of micturition: Secondary | ICD-10-CM

## 2018-07-07 DIAGNOSIS — E1165 Type 2 diabetes mellitus with hyperglycemia: Secondary | ICD-10-CM

## 2018-07-07 DIAGNOSIS — E349 Endocrine disorder, unspecified: Secondary | ICD-10-CM | POA: Diagnosis not present

## 2018-07-07 DIAGNOSIS — F1190 Opioid use, unspecified, uncomplicated: Secondary | ICD-10-CM | POA: Diagnosis not present

## 2018-07-07 LAB — POCT GLYCOSYLATED HEMOGLOBIN (HGB A1C)
Est. average glucose Bld gHb Est-mCnc: 189
Hemoglobin A1C: 8.2 % — AB (ref 4.0–5.6)

## 2018-07-07 MED ORDER — ERTUGLIFLOZIN L-PYROGLUTAMICAC 15 MG PO TABS: 15.0000 mg | ORAL_TABLET | Freq: Every day | ORAL | 0 refills | Status: DC

## 2018-07-07 NOTE — Progress Notes (Signed)
Patient: Taylor [name] Male    DOB: Jan 10, 1956   63 y.o.   MRN: [id number] Visit Date: 07/07/2018  Today's Provider: Lelon Huh, MD   Chief Complaint  Patient presents with  . Diabetes  . Hypogonadism   Subjective:     HPI  Diabetes Mellitus Type II, Follow-up:   Lab Results  Component Value Date   HGBA1C 9.2 (A) 04/07/2018   HGBA1C 8.9 (A) 12/20/2017   HGBA1C 9.3 09/20/2017    Last seen for diabetes 3 months ago.  Management since then includes no changes. He reports fair compliance with treatment. He is not having side effects.  Current symptoms include none and have been stable. Home blood sugar records: fasting range: 150-200  Episodes of hypoglycemia? no   Current Insulin Regimen: none Most Recent Eye Exam: 04/01/2018 Weight trend: fluctuating a bit Prior visit with dietician: no Current diet: regular diet Current exercise: none  Pertinent Labs:    Component Value Date/Time   CHOL 157 04/07/2018 1058   TRIG 111 04/07/2018 1058   HDL 43 04/07/2018 1058   LDLCALC 92 04/07/2018 1058   CREATININE 0.93 08/09/2017 0923    Wt Readings from Last 3 Encounters:  07/07/18 (!) 333 lb (151 kg)  04/07/18 (!) 339 lb 3.2 oz (153.9 kg)  02/16/18 (!) 330 lb (149.7 kg)    ------------------------------------------------------------------------  Follow up for Hypogonadism:  The patient was last seen for this 3 months ago. Changes made at last visit include increasing testosterone injection to 40m every 14 days.  He reports poor compliance with treatment. Patient has missed 1 month for taking this medication.  He feels that condition is stable. He is not having side effects.   ------------------------------------------------------------------------------------  Hypertension, follow-up:  BP Readings from Last 3 Encounters:  07/07/18 138/82  04/07/18 132/88  01/19/18 140/80    He was last seen for hypertension 3 months ago.  BP at that  visit was 132/88. Management since that visit includes no changes. He reports good compliance with treatment. He is not having side effects.  He is not exercising. He is not adherent to low salt diet.   Outside blood pressures are not checked. He is experiencing none.  Patient denies chest pain, chest pressure/discomfort, claudication, dyspnea, exertional chest pressure/discomfort, fatigue, irregular heart beat, lower extremity edema, near-syncope, orthopnea, palpitations, paroxysmal nocturnal dyspnea, syncope and tachypnea.   Cardiovascular risk factors include advanced age (older than 563for men, 654for women), diabetes mellitus, hypertension and male gender.  Use of agents associated with hypertension: NSAIDS.     Weight trend: fluctuating a bit Wt Readings from Last 3 Encounters:  07/07/18 (!) 333 lb (151 kg)  04/07/18 (!) 339 lb 3.2 oz (153.9 kg)  02/16/18 (!) 330 lb (149.7 kg)    Current diet: regular diet  ------------------------------------------------------------------------    No Known Allergies   Current Outpatient Medications:  .  aspirin 81 MG chewable tablet, 81 mg once daily., Disp: , Rfl:  .  canagliflozin (INVOKANA) 300 MG TABS tablet, Take 1 tablet (300 mg total) by mouth daily before breakfast., Disp: 20 tablet, Rfl: 0 .  glipiZIDE (GLUCOTROL XL) 10 MG 24 hr tablet, TAKE 1 TABLET BY MOUTH ONCE DAILY., Disp: 30 tablet, Rfl: 11 .  HYDROmorphone (DILAUDID) 4 MG tablet, Take 4 mg by mouth every 6 (six) hours as needed (for break through pain). , Disp: , Rfl:  .  lisinopril (PRINIVIL,ZESTRIL) 10 MG tablet, TAKE 1 TABLET BY MOUTH  ONCE DAILY., Disp: 30 tablet, Rfl: 12 .  metFORMIN (GLUCOPHAGE) 1000 MG tablet, TAKE 1 TABLET BY MOUTH TWICE DAILY, Disp: 60 tablet, Rfl: 12 .  pioglitazone (ACTOS) 45 MG tablet, TAKE 1 TABLET BY MOUTH ONCE DAILY., Disp: 30 tablet, Rfl: 12 .  pregabalin (LYRICA) 75 MG capsule, Take by mouth., Disp: , Rfl:  .  simvastatin (ZOCOR) 40 MG  tablet, Take 1 tablet (40 mg total) by mouth daily., Disp: 90 tablet, Rfl: 4 .  testosterone cypionate (DEPOTESTOSTERONE CYPIONATE) 200 MG/ML injection, Inject 2 mL into muscle every 14 days., Disp: 10 mL, Rfl: 1 .  zolpidem (AMBIEN CR) 12.5 MG CR tablet, AMBIEN CR, 12.5MG (Oral Tablet Extended Release)  1 PO QHS for 0 days  Quantity: 30.00;  Refills: 0   Ordered :15-Jul-2010  Reginia Forts MD;  Started 03-Mar-2007 Active, Disp: , Rfl:  .  canagliflozin (INVOKANA) 100 MG TABS tablet, Take 3 tablets (300 mg total) by mouth daily before breakfast. (Patient not taking: Reported on 07/07/2018), Disp: 30 tablet, Rfl: 0 .  cyanocobalamin 100 MCG tablet, Take 100 mcg by mouth daily., Disp: , Rfl:  .  lubiprostone (AMITIZA) 24 MCG capsule, Take 1 capsule (24 mcg total) by mouth 2 (two) times daily with a meal. (Patient not taking: Reported on 07/07/2018), Disp: 60 capsule, Rfl: 3 .  meloxicam (MOBIC) 7.5 MG tablet, Take 7.5 mg by mouth 2 (two) times daily., Disp: , Rfl: 2 .  NARCAN 4 MG/0.1ML LIQD nasal spray kit, , Disp: , Rfl:  .  oxyCODONE (OXYCONTIN) 40 mg 12 hr tablet, Take 40 mg by mouth every 12 (twelve) hours. As needed for pain, Disp: , Rfl:   Review of Systems  Constitutional: Negative for appetite change, chills and fever.  Respiratory: Negative for chest tightness, shortness of breath and wheezing.   Cardiovascular: Negative for chest pain and palpitations.  Gastrointestinal: Negative for abdominal pain, nausea and vomiting.  Musculoskeletal: Positive for back pain.    Social History   Tobacco Use  . Smoking status: Never Smoker  . Smokeless tobacco: Never Used  Substance Use Topics  . Alcohol use: No    Alcohol/week: 0.0 standard drinks      Objective:   BP 138/82 (BP Location: Right Arm, Cuff Size: Large)   Pulse 90   Temp 97.9 F (36.6 C) (Oral)   Resp 16   Wt (!) 333 lb (151 kg)   SpO2 95% Comment: room air  BMI 45.16 kg/m  Vitals:   07/07/18 0941 07/07/18 0954  BP: (!)  150/90 138/82  Pulse: 90   Resp: 16   Temp: 97.9 F (36.6 C)   TempSrc: Oral   SpO2: 95%   Weight: (!) 333 lb (151 kg)      Physical Exam   General Appearance:    Alert, cooperative, no distress  Eyes:    PERRL, conjunctiva/corneas clear, EOM's intact       Lungs:     Clear to auscultation bilaterally, respirations unlabored  Heart:    Regular rate and rhythm  Neurologic:   Awake, alert, oriented x 3. No apparent focal neurological           defect.       Results for orders placed or performed in visit on 07/07/18  POCT HgB A1C  Result Value Ref Range   Hemoglobin A1C 8.2 (A) 4.0 - 5.6 %   HbA1c POC (<> result, manual entry)     HbA1c, POC (prediabetic range)  HbA1c, POC (controlled diabetic range)     Est. average glucose Bld gHb Est-mCnc 189        Assessment & Plan    .1. Uncontrolled type 2 diabetes mellitus with hyperglycemia ([place]) Not to goal but a1c is improving has not been able to afford Invokana, but has been taking samples consistently. No samples Invokana available today, so given 4 weeks of- Ertugliflozin L-PyroglutamicAc (STEGLATRO) 15 MG TABS; Take 15 mg by mouth daily.  Dispense: 28 tablet; Refill: 0  2. Urinary hesitancy Likely some underlying BPH and advised testosterone may be aggravating condition. Try OTC Saw Palmetto  3. Hypotestosteronism Continue current dose of IM testosterone. He missed several doses over the last few months, so will wait until next visit to recheck.   Future Appointments  Date Time Provider [place]  11/07/2018 11:00 AM Mollee Neer, Kirstie Peri, MD [facility] None       Lelon Huh, MD  [facility]

## 2018-07-07 NOTE — Patient Instructions (Addendum)
.   Please review the attached list of medications and notify my office if there are any errors.    Take one 15mg  Steglatro sample tablet once a day in place of Invokana. They work the same way.   . Try OTC Saw Palmetto to see if it will help make it easier to empty your bladder when you have to urinate

## 2018-08-09 ENCOUNTER — Telehealth: Payer: Self-pay

## 2018-08-09 NOTE — Telephone Encounter (Signed)
Please advise samples? 

## 2018-08-09 NOTE — Telephone Encounter (Signed)
Patient called requesting samples of Sterglatro 15mg . Patient states Dr. Sherrie Mustache changed him to this medication and he has a couple of tablets left. Call patient when samples are ready for pick up.

## 2018-08-10 NOTE — Telephone Encounter (Signed)
He can have 3 weeks samples if we have them.  °

## 2018-08-11 NOTE — Telephone Encounter (Signed)
Done.cbe 

## 2018-09-12 ENCOUNTER — Telehealth: Payer: Self-pay

## 2018-09-12 DIAGNOSIS — E1165 Type 2 diabetes mellitus with hyperglycemia: Secondary | ICD-10-CM

## 2018-09-12 MED ORDER — ERTUGLIFLOZIN L-PYROGLUTAMICAC 15 MG PO TABS: 15.0000 mg | ORAL_TABLET | Freq: Every day | ORAL | 0 refills | Status: DC

## 2018-09-12 NOTE — Telephone Encounter (Signed)
He can have 5 sample bottles. Thanks.

## 2018-09-12 NOTE — Telephone Encounter (Signed)
Samples left up front for pick up.  

## 2018-09-12 NOTE — Telephone Encounter (Signed)
Patient called requesting samples of Ertugliflozin L-PyroglutamicAc (STEGLATRO) 15 MG TABS. Please let him know if we have some available. Thanks!

## 2018-09-12 NOTE — Telephone Encounter (Signed)
Is it ok to give samples? We do have some available. Please advise.

## 2018-10-12 ENCOUNTER — Other Ambulatory Visit: Payer: Self-pay | Admitting: Family Medicine

## 2018-10-12 DIAGNOSIS — E291 Testicular hypofunction: Secondary | ICD-10-CM

## 2018-11-01 ENCOUNTER — Telehealth: Payer: Self-pay | Admitting: Family Medicine

## 2018-11-01 ENCOUNTER — Other Ambulatory Visit: Payer: Self-pay | Admitting: Family Medicine

## 2018-11-01 NOTE — Telephone Encounter (Signed)
Please advise on samples. We only have 1 bottle of Steglatro 15mg  in the supply closet.

## 2018-11-01 NOTE — Telephone Encounter (Signed)
He can have the sample bottle of steglatro and check back with [place] next week to see if we get more in.

## 2018-11-01 NOTE — Telephone Encounter (Signed)
Tried calling patient. No answer. Sample placed up front for pick up.  Please call patient and advise him.

## 2018-11-01 NOTE — Telephone Encounter (Signed)
Pt needs samples of Steglatro 15 mg.  He said that we give him samples.  CB#  [phone]  Thanks Barth Kirks

## 2018-11-02 NOTE — Telephone Encounter (Signed)
Tried calling patient.No answer.Will try again later.

## 2018-11-07 ENCOUNTER — Encounter: Payer: Self-pay | Admitting: Family Medicine

## 2018-11-07 ENCOUNTER — Other Ambulatory Visit: Payer: Self-pay

## 2018-11-07 ENCOUNTER — Ambulatory Visit (INDEPENDENT_AMBULATORY_CARE_PROVIDER_SITE_OTHER): Payer: Medicare Other | Admitting: Family Medicine

## 2018-11-07 VITALS — BP 148/96 | HR 85 | Temp 98.6°F | Resp 16 | Wt 330.0 lb

## 2018-11-07 DIAGNOSIS — E1165 Type 2 diabetes mellitus with hyperglycemia: Secondary | ICD-10-CM

## 2018-11-07 DIAGNOSIS — E291 Testicular hypofunction: Secondary | ICD-10-CM | POA: Diagnosis not present

## 2018-11-07 LAB — POCT GLYCOSYLATED HEMOGLOBIN (HGB A1C)
Est. average glucose Bld gHb Est-mCnc: 197
Hemoglobin A1C: 8.5 % — AB (ref 4.0–5.6)

## 2018-11-07 NOTE — Patient Instructions (Signed)
.   Please review the attached list of medications and notify my office if there are any errors.   . Please bring all of your medications to every appointment so we can make sure that our medication list is the same as yours.    Check with your insurance and see what the copay is for generic testosterone 24 hour patch. Dose of the patch  is 4mg /24 hours.

## 2018-11-07 NOTE — Progress Notes (Signed)
Patient: Taylor [name] Male    DOB: [birth year]   63 y.o.   MRN: [id number] Visit Date: 11/07/2018  Today's Provider: Lelon Huh, MD   Chief Complaint  Patient presents with  . Diabetes   Subjective:     HPI  Diabetes Mellitus Type II, Follow-up:   Lab Results  Component Value Date   HGBA1C 8.2 (A) 07/07/2018   HGBA1C 9.2 (A) 04/07/2018   HGBA1C 8.9 (A) 12/20/2017    Last seen for diabetes 4 months ago.  Management since then includes giving patient samples of Steglatro 44m. He reports fair compliance with treatment. Patient has been out of Steglatro for the past week.  He is not having side effects.  Current symptoms include paresthesia of the feet and have been stable. Home blood sugar records: fasting range: 150-175  Episodes of hypoglycemia? no   Current Insulin Regimen: none Most Recent Eye Exam: 04/01/2018 Weight trend: fluctuating a bit Prior visit with dietician: No Current exercise: none Current diet habits: regular diet  Pertinent Labs:    Component Value Date/Time   CHOL 157 04/07/2018 1058   TRIG 111 04/07/2018 1058   HDL 43 04/07/2018 1058   LDLCALC 92 04/07/2018 1058   CREATININE 0.93 08/09/2017 0923    Wt Readings from Last 3 Encounters:  11/07/18 (!) 330 lb (149.7 kg)  07/07/18 (!) 333 lb (151 kg)  04/07/18 (!) 339 lb 3.2 oz (153.9 kg)    Complains of feet burning Complains of urinary hesitancy  ------------------------------------------------------------------------ Follow up for Hypotestosteronism The patient was last seen for this 4 months ago. No changes were made at his last visit.   He reports good compliance with treatment. He feels that condition is Unchanged. He  Is having some urinary hesitancy, but no nocturia. He tried saw palmetto but states hit smelled bad. .   ------------------------------------------------------------------------------------  No Known Allergies   Current Outpatient Medications:   .  aspirin 81 MG chewable tablet, 81 mg once daily., Disp: , Rfl:  .  Ertugliflozin L-PyroglutamicAc (STEGLATRO) 15 MG TABS, Take 15 mg by mouth daily., Disp: 35 tablet, Rfl: 0 .  glipiZIDE (GLUCOTROL XL) 10 MG 24 hr tablet, TAKE 1 TABLET BY MOUTH ONCE DAILY., Disp: 30 tablet, Rfl: 11 .  HYDROmorphone (DILAUDID) 4 MG tablet, Take 4 mg by mouth every 6 (six) hours as needed (for break through pain). , Disp: , Rfl:  .  lisinopril (PRINIVIL,ZESTRIL) 10 MG tablet, TAKE 1 TABLET BY MOUTH ONCE DAILY., Disp: 30 tablet, Rfl: 12 .  meloxicam (MOBIC) 7.5 MG tablet, Take 7.5 mg by mouth 2 (two) times daily., Disp: , Rfl: 2 .  metFORMIN (GLUCOPHAGE) 1000 MG tablet, TAKE 1 TABLET BY MOUTH TWICE DAILY, Disp: 60 tablet, Rfl: 12 .  oxyCODONE (OXYCONTIN) 40 mg 12 hr tablet, Take 40 mg by mouth every 12 (twelve) hours. As needed for pain., Disp: , Rfl:  .  pioglitazone (ACTOS) 45 MG tablet, TAKE 1 TABLET BY MOUTH ONCE DAILY., Disp: 30 tablet, Rfl: 12 .  pregabalin (LYRICA) 75 MG capsule, Take by mouth., Disp: , Rfl:  .  simvastatin (ZOCOR) 40 MG tablet, TAKE 1 TABLET BY MOUTH ONCE DAILY., Disp: 90 tablet, Rfl: 4 .  testosterone cypionate (DEPOTESTOSTERONE CYPIONATE) 200 MG/ML injection, INJCET 2ML I.M. ONCE EVERY 14 DAYS., Disp: 10 mL, Rfl: 3 .  zolpidem (AMBIEN CR) 12.5 MG CR tablet, AMBIEN CR, 12.5MG (Oral Tablet Extended Release)  1 PO QHS for 0 days  Quantity: 30.00;  Refills: 0   Ordered :15-Jul-2010  Reginia Forts MD;  Started 03-Mar-2007 Active, Disp: , Rfl:  .  lubiprostone (AMITIZA) 24 MCG capsule, Take 1 capsule (24 mcg total) by mouth 2 (two) times daily with a meal. (Patient not taking: Reported on 07/07/2018), Disp: 60 capsule, Rfl: 3 .  NARCAN 4 MG/0.1ML LIQD nasal spray kit, , Disp: , Rfl:   Review of Systems  Constitutional: Negative for appetite change, chills and fever.  Respiratory: Negative for chest tightness, shortness of breath and wheezing.   Cardiovascular: Negative for chest pain and  palpitations.  Gastrointestinal: Negative for abdominal pain, nausea and vomiting.  Musculoskeletal: Positive for back pain.  Neurological: Positive for numbness (tingling in hands and feet).    Social History   Tobacco Use  . Smoking status: Never Smoker  . Smokeless tobacco: Never Used  Substance Use Topics  . Alcohol use: No    Alcohol/week: 0.0 standard drinks      Objective:   BP (!) 148/96 (BP Location: Left Arm, Cuff Size: Large)   Pulse 85   Temp 98.6 F (37 C) (Oral)   Resp 16   Wt (!) 330 lb (149.7 kg)   SpO2 95% Comment: room air  BMI 44.76 kg/m  Vitals:   11/07/18 1107 11/07/18 1113  BP: (!) 160/90 (!) 148/96  Pulse: 85   Resp: 16   Temp: 98.6 F (37 C)   TempSrc: Oral   SpO2: 95%   Weight: (!) 330 lb (149.7 kg)      Physical Exam   General Appearance:    Alert, cooperative, no distress  Eyes:    PERRL, conjunctiva/corneas clear, EOM's intact       Lungs:     Clear to auscultation bilaterally, respirations unlabored  Heart:    Regular rate and rhythm  Neurologic:   Awake, alert, oriented x 3. No apparent focal neurological           defect.       Results for orders placed or performed in visit on 11/07/18  POCT HgB A1C  Result Value Ref Range   Hemoglobin A1C 8.5 (A) 4.0 - 5.6 %   HbA1c POC (<> result, manual entry)     HbA1c, POC (prediabetic range)     HbA1c, POC (controlled diabetic range)     Est. average glucose Bld gHb Est-mCnc 197        Assessment & Plan    1. Uncontrolled type 2 diabetes mellitus with hyperglycemia (HCC) A1c is up today, but has been out of steglatro. Given samples to get by for the next week and is to call if he can't get prescription refilled. Recheck a1c in about 3 months.  - POCT HgB A1C  2. Morbid obesity ([place])   3. Hypogonadism, male Having trouble with doing self injections, although states he has been able to do them more consistently lately. Its been a few years since he tried filling prescription  for topical testosterone. He's going to check with insurance to see if coverage is better now.      Lelon Huh, MD  [facility]

## 2018-11-14 ENCOUNTER — Telehealth: Payer: Self-pay | Admitting: Family Medicine

## 2018-11-14 NOTE — Telephone Encounter (Signed)
He can have one months samples.

## 2018-11-14 NOTE — Telephone Encounter (Signed)
Samples at the front desk.  Left message advising pt.   Thanks,   -Mickel Baas

## 2018-11-14 NOTE — Telephone Encounter (Signed)
Pt asking if there are anymore samples of Ertugliflozin L-PyroglutamicAc (STEGLATRO) 15 MG TABS that can be given to him.   Pt took his last one on Sat. 11-12-2018.  Please call pt back to let him know.  Thanks, [employer]

## 2018-12-19 ENCOUNTER — Telehealth: Payer: Self-pay | Admitting: Family Medicine

## 2018-12-19 NOTE — Telephone Encounter (Signed)
Pt called asking for samples Steglatro 15 mg.  He is out.  CB#  [phone]  Con [place]

## 2018-12-19 NOTE — Telephone Encounter (Signed)
We only have samples of the 5mg  dose, please advise, we have 6 sample bottles left. KW

## 2018-12-19 NOTE — Telephone Encounter (Signed)
He can have six bottles of the 5mg , he'll have to take 3 tablets a day.

## 2018-12-20 NOTE — Telephone Encounter (Signed)
LMTCB 12/20/2018   Thanks,   -Mickel Baas

## 2018-12-21 NOTE — Telephone Encounter (Signed)
Pt advised.   Thanks,   -Garlin Batdorf  

## 2019-01-10 ENCOUNTER — Other Ambulatory Visit: Payer: Self-pay | Admitting: Family Medicine

## 2019-01-10 ENCOUNTER — Telehealth: Payer: Self-pay | Admitting: Family Medicine

## 2019-01-10 NOTE — Telephone Encounter (Signed)
That's fine

## 2019-01-10 NOTE — Telephone Encounter (Signed)
There are some 15mg  samples.  Is it okay to give?   Thanks,   -Mickel Baas

## 2019-01-10 NOTE — Telephone Encounter (Signed)
Pt wanting to know if there are any more samples of Ertugliflozin L-PyroglutamicAc (STEGLATRO) 15 MG TABS He could have.  Please call pt back to let him know.  Thanks, [employer]

## 2019-01-11 NOTE — Telephone Encounter (Signed)
Samples at the front desk, pt advised.   Thanks,   -Mickel Baas

## 2019-02-01 ENCOUNTER — Other Ambulatory Visit: Payer: Self-pay

## 2019-02-01 NOTE — Patient Outreach (Signed)
[facility]) Care Management  02/01/2019  Taylor [name] 09-16-1955 [id number]   Medication Adherence call to Mr. [name] Hippa Identifiers Verify spoke with patient he is past due on Simvastatin 40 mg patient explain he is taking 1 tablet daily and has enough until next Monday patient  has a pill box he fills on a weekly basis, patient said sometimes he forgets to to take it.he will order in a couple of days when he is finished with the ones he have.Mr. Soy is showing past due under [place].  [facility] Management Direct Dial [phone]  Fax [phone] [email or web address]

## 2019-02-07 DIAGNOSIS — S8290XA Unspecified fracture of unspecified lower leg, initial encounter for closed fracture: Secondary | ICD-10-CM

## 2019-02-07 HISTORY — DX: Unspecified fracture of unspecified lower leg, initial encounter for closed fracture: S82.90XA

## 2019-02-15 ENCOUNTER — Telehealth: Payer: Self-pay | Admitting: Family Medicine

## 2019-02-15 NOTE — Telephone Encounter (Signed)
Can have 4 boxes of Farxiga 10mg  samples, works the same way as Catering manager

## 2019-02-15 NOTE — Telephone Encounter (Signed)
We only have samples in office of the 5mg  please advise. KW

## 2019-02-15 NOTE — Telephone Encounter (Signed)
t called wanting to know if we have any samples of Steglatro 15 mg   CB#  [phone]  teri

## 2019-02-15 NOTE — Telephone Encounter (Signed)
Left message for patient to all back samples will be left upfront for pickup. KW

## 2019-02-16 DIAGNOSIS — M79605 Pain in left leg: Secondary | ICD-10-CM | POA: Diagnosis not present

## 2019-02-16 DIAGNOSIS — S89292A Other physeal fracture of upper end of left fibula, initial encounter for closed fracture: Secondary | ICD-10-CM | POA: Diagnosis not present

## 2019-02-16 DIAGNOSIS — W19XXXA Unspecified fall, initial encounter: Secondary | ICD-10-CM | POA: Diagnosis not present

## 2019-03-02 DIAGNOSIS — S89292A Other physeal fracture of upper end of left fibula, initial encounter for closed fracture: Secondary | ICD-10-CM | POA: Diagnosis not present

## 2019-03-10 ENCOUNTER — Ambulatory Visit: Payer: Self-pay | Admitting: Family Medicine

## 2019-03-13 ENCOUNTER — Other Ambulatory Visit: Payer: Self-pay

## 2019-03-13 ENCOUNTER — Ambulatory Visit (INDEPENDENT_AMBULATORY_CARE_PROVIDER_SITE_OTHER): Payer: Medicare Other | Admitting: Family Medicine

## 2019-03-13 ENCOUNTER — Encounter: Payer: Self-pay | Admitting: Family Medicine

## 2019-03-13 VITALS — BP 132/82 | HR 82 | Temp 96.9°F | Resp 16 | Wt 347.0 lb

## 2019-03-13 DIAGNOSIS — E7800 Pure hypercholesterolemia, unspecified: Secondary | ICD-10-CM

## 2019-03-13 DIAGNOSIS — I10 Essential (primary) hypertension: Secondary | ICD-10-CM

## 2019-03-13 DIAGNOSIS — E119 Type 2 diabetes mellitus without complications: Secondary | ICD-10-CM

## 2019-03-13 DIAGNOSIS — Z23 Encounter for immunization: Secondary | ICD-10-CM

## 2019-03-13 DIAGNOSIS — Z125 Encounter for screening for malignant neoplasm of prostate: Secondary | ICD-10-CM

## 2019-03-13 LAB — POCT GLYCOSYLATED HEMOGLOBIN (HGB A1C)
Est. average glucose Bld gHb Est-mCnc: 157
Hemoglobin A1C: 7.1 % — AB (ref 4.0–5.6)

## 2019-03-13 MED ORDER — ANDRODERM 4 MG/24HR TD PT24: 1.0000 | MEDICATED_PATCH | Freq: Every day | TRANSDERMAL | 5 refills | Status: DC

## 2019-03-13 MED ORDER — STEGLATRO 15 MG PO TABS: 15.0000 mg | ORAL_TABLET | Freq: Every day | ORAL | 0 refills | Status: DC

## 2019-03-13 NOTE — Progress Notes (Signed)
Patient: Taylor [name] Male    DOB: 1956/03/26   63 y.o.   MRN: [id number] Visit Date: 03/13/2019  Today's Provider: Lelon Huh, MD   Chief Complaint  Patient presents with  . Follow-up   Subjective:     HPI  Diabetes Mellitus Type II, Follow-up:   Lab Results  Component Value Date   HGBA1C 8.5 (A) 11/07/2018   HGBA1C 8.2 (A) 07/07/2018   HGBA1C 9.2 (A) 04/07/2018    Last seen for diabetes 4 months ago.  Management since then includes no changes. He has not been able to afford the SGLT-1 inhibitor so has been taking samples of Farxiga 10 the last month, previously on samples of Steglatro.  He reports good compliance with treatment. He is not having side effects.  Current symptoms include none and have been stable. Home blood sugar records: fasting range: 140-160  Episodes of hypoglycemia? no   Current insulin regiment: Is not on insulin Most Recent Eye Exam: 04/01/2018 Weight trend: increasing steadily Prior visit with dietician: No Current exercise: none Current diet habits: well balanced  Pertinent Labs:    Component Value Date/Time   CHOL 157 04/07/2018 1058   TRIG 111 04/07/2018 1058   HDL 43 04/07/2018 1058   LDLCALC 92 04/07/2018 1058   CREATININE 0.93 08/09/2017 0923    Wt Readings from Last 3 Encounters:  03/13/19 (!) 347 lb (157.4 kg)  11/07/18 (!) 330 lb (149.7 kg)  07/07/18 (!) 333 lb (151 kg)    ------------------------------------------------------------------------  Follow up for Hypogonadism:  The patient was last seen for this 4 months ago. Changes made at last visit include none; patient advised to check insurance coverage for topical testosterone.  He reports poor compliance with treatment. Patient has stopped administering Testosterone injections. He hasn't heard back from his insurance on coverage of Topical testosterone.  He feels that condition is Unchanged. He is not having side effects.    ------------------------------------------------------------------------------------  Hypertension, follow-up:  BP Readings from Last 3 Encounters:  03/13/19 132/82  11/07/18 (!) 148/96  07/07/18 138/82    He was last seen for hypertension 11 months ago.  BP at that visit was 132/88. Management since that visit includes no changes. He reports good compliance with treatment. He is not having side effects.  He is not exercising. He is not adherent to low salt diet.   Outside blood pressures are not being checked. He is experiencing none.  Patient denies chest pain, chest pressure/discomfort, claudication, dyspnea, exertional chest pressure/discomfort, fatigue, irregular heart beat, lower extremity edema, near-syncope, orthopnea, palpitations, paroxysmal nocturnal dyspnea, syncope and tachypnea.   Cardiovascular risk factors include advanced age (older than 30 for men, 63 for women), diabetes mellitus, dyslipidemia, hypertension and male gender.  Use of agents associated with hypertension: NSAIDS.     Weight trend: increasing steadily Wt Readings from Last 3 Encounters:  03/13/19 (!) 347 lb (157.4 kg)  11/07/18 (!) 330 lb (149.7 kg)  07/07/18 (!) 333 lb (151 kg)    Current diet: well balanced  ------------------------------------------------------------------------  Lipid/Cholesterol, Follow-up:   Last seen for this 11 months ago.  Management changes since that visit include none. . Last Lipid Panel:    Component Value Date/Time   CHOL 157 04/07/2018 1058   TRIG 111 04/07/2018 1058   HDL 43 04/07/2018 1058   CHOLHDL 3.7 04/07/2018 1058   [place] 92 04/07/2018 1058    Risk factors for vascular disease include diabetes mellitus, hypercholesterolemia and hypertension  He reports good compliance with treatment. He is not having side effects.  Current symptoms include none and have been stable. Weight trend: increasing steadily Prior visit with dietician: no Current  diet: well balanced Current exercise: none  Wt Readings from Last 3 Encounters:  03/13/19 (!) 347 lb (157.4 kg)  11/07/18 (!) 330 lb (149.7 kg)  07/07/18 (!) 333 lb (151 kg)    ------------------------------------------------------------------- He also states that he stopped testosterone injection due to severe anxiety about giving himself injections and wants to see if insurance will cover testosterone patches.   No Known Allergies   Current Outpatient Medications:  .  aspirin 81 MG chewable tablet, 81 mg once daily., Disp: , Rfl:  .  Ertugliflozin L-PyroglutamicAc (STEGLATRO) 15 MG TABS, Take 15 mg by mouth daily., Disp: 35 tablet, Rfl: 0 .  glipiZIDE (GLUCOTROL XL) 10 MG 24 hr tablet, TAKE 1 TABLET BY MOUTH ONCE DAILY., Disp: 30 tablet, Rfl: 12 .  HYDROmorphone (DILAUDID) 4 MG tablet, Take 4 mg by mouth every 6 (six) hours as needed (for break through pain). , Disp: , Rfl:  .  lisinopril (PRINIVIL,ZESTRIL) 10 MG tablet, TAKE 1 TABLET BY MOUTH ONCE DAILY., Disp: 30 tablet, Rfl: 12 .  lubiprostone (AMITIZA) 24 MCG capsule, Take 1 capsule (24 mcg total) by mouth 2 (two) times daily with a meal., Disp: 60 capsule, Rfl: 3 .  meloxicam (MOBIC) 7.5 MG tablet, Take 7.5 mg by mouth 2 (two) times daily., Disp: , Rfl: 2 .  metFORMIN (GLUCOPHAGE) 1000 MG tablet, TAKE 1 TABLET BY MOUTH TWICE DAILY, Disp: 60 tablet, Rfl: 12 .  NARCAN 4 MG/0.1ML LIQD nasal spray kit, , Disp: , Rfl:  .  oxyCODONE (OXYCONTIN) 40 mg 12 hr tablet, Take 40 mg by mouth every 12 (twelve) hours. As needed for pain., Disp: , Rfl:  .  pioglitazone (ACTOS) 45 MG tablet, TAKE 1 TABLET BY MOUTH ONCE DAILY., Disp: 30 tablet, Rfl: 12 .  pregabalin (LYRICA) 75 MG capsule, Take by mouth., Disp: , Rfl:  .  simvastatin (ZOCOR) 40 MG tablet, TAKE 1 TABLET BY MOUTH ONCE DAILY., Disp: 90 tablet, Rfl: 4 .  zolpidem (AMBIEN CR) 12.5 MG CR tablet, AMBIEN CR, 12.5MG (Oral Tablet Extended Release)  1 PO QHS for 0 days  Quantity: 30.00;   Refills: 0   Ordered :15-Jul-2010  Reginia Forts MD;  Started 03-Mar-2007 Active, Disp: , Rfl:  .  testosterone cypionate (DEPOTESTOSTERONE CYPIONATE) 200 MG/ML injection, INJCET 2ML I.M. ONCE EVERY 14 DAYS. (Patient not taking: Reported on 03/13/2019), Disp: 10 mL, Rfl: 3  Review of Systems  Constitutional: Negative for appetite change, chills and fever.  Respiratory: Negative for chest tightness, shortness of breath and wheezing.   Cardiovascular: Negative for chest pain and palpitations.  Gastrointestinal: Negative for abdominal pain, nausea and vomiting.    Social History   Tobacco Use  . Smoking status: Never Smoker  . Smokeless tobacco: Never Used  Substance Use Topics  . Alcohol use: No    Alcohol/week: 0.0 standard drinks      Objective:   BP 132/82 (BP Location: Left Arm, Patient Position: Sitting, Cuff Size: Large)   Pulse 82   Temp (!) 96.9 F (36.1 C) (Temporal)   Resp 16   Wt (!) 347 lb (157.4 kg)   SpO2 96% Comment: room air  BMI 47.06 kg/m  Vitals:   03/13/19 1031  BP: 132/82  Pulse: 82  Resp: 16  Temp: (!) 96.9 F (36.1 C)  TempSrc: Temporal  SpO2: 96%  Weight: (!) 347 lb (157.4 kg)  Body mass index is 47.06 kg/m.   Physical Exam  General appearance: Obese male, cooperative and in no acute distress Head: Normocephalic, without obvious abnormality, atraumatic Respiratory: Respirations even and unlabored, normal respiratory rate Extremities: All extremities are intact.  Skin: Skin color, texture, turgor normal. No rashes seen  Psych: Appropriate mood and affect. Neurologic: Mental status: Alert, oriented to person, place, and time, thought content appropriate.  No results found for any visits on 03/13/19. A1c-7.1    Assessment & Plan    1. Type 2 diabetes mellitus without complication, without long-term current use of insulin (HCC) Much better with addition of SGLT-1 inhibitor. Given samples of- Ertugliflozin L-PyroglutamicAc (STEGLATRO) 15 MG  TABS; Take 15 mg by mouth daily.  Dispense: 28 tablet; Refill: 0 To take in place of Farxiga samples he has been taking  2. Hypertension, essential, benign Well controlled.  Continue current medications.    3. Hypercholesteremia He is tolerating simvastatin well with no adverse effects.   - Comprehensive metabolic panel - Lipid panel  4. Prostate cancer screening  - PSA  5. Need for influenza vaccination  - Flu Vaccine QUAD 6+ mos PF IM (Fluarix Quad PF)     Lelon Huh, MD  [facility]

## 2019-03-13 NOTE — Patient Instructions (Signed)
.   Please review the attached list of medications and notify my office if there are any errors.   . Please bring all of your medications to every appointment so we can make sure that our medication list is the same as yours.   . It is especially important to get the annual flu vaccine this year. If you haven't had it already, please go to your pharmacy or call the office as soon as possible to schedule you flu shot.  

## 2019-03-14 LAB — COMPREHENSIVE METABOLIC PANEL
ALT: 13 IU/L (ref 0–44)
AST: 15 IU/L (ref 0–40)
Albumin/Globulin Ratio: 1.3 (ref 1.2–2.2)
Albumin: 4.1 g/dL (ref 3.8–4.8)
Alkaline Phosphatase: 80 IU/L (ref 39–117)
BUN/Creatinine Ratio: 16 (ref 10–24)
BUN: 15 mg/dL (ref 8–27)
Bilirubin Total: 0.4 mg/dL (ref 0.0–1.2)
CO2: 23 mmol/L (ref 20–29)
Calcium: 9.3 mg/dL (ref 8.6–10.2)
Chloride: 99 mmol/L (ref 96–106)
Creatinine, Ser: 0.96 mg/dL (ref 0.76–1.27)
GFR calc Af Amer: 98 mL/min/{1.73_m2} (ref >59)
GFR calc non Af Amer: 85 mL/min/{1.73_m2} (ref >59)
Globulin, Total: 3.1 g/dL (ref 1.5–4.5)
Glucose: 99 mg/dL (ref 65–99)
Potassium: 4.9 mmol/L (ref 3.5–5.2)
Sodium: 138 mmol/L (ref 134–144)
Total Protein: 7.2 g/dL (ref 6.0–8.5)

## 2019-03-14 LAB — PSA: Prostate Specific Ag, Serum: 0.5 ng/mL (ref 0.0–4.0)

## 2019-03-14 LAB — LIPID PANEL
Chol/HDL Ratio: 2.9 ratio (ref 0.0–5.0)
Cholesterol, Total: 155 mg/dL (ref 100–199)
HDL: 53 mg/dL (ref >39)
LDL Chol Calc (NIH): 84 mg/dL (ref 0–99)
Triglycerides: 97 mg/dL (ref 0–149)
VLDL Cholesterol Cal: 18 mg/dL (ref 5–40)

## 2019-03-30 DIAGNOSIS — S89292A Other physeal fracture of upper end of left fibula, initial encounter for closed fracture: Secondary | ICD-10-CM | POA: Diagnosis not present

## 2019-04-17 ENCOUNTER — Other Ambulatory Visit: Payer: Self-pay | Admitting: Family Medicine

## 2019-04-17 ENCOUNTER — Telehealth: Payer: Self-pay | Admitting: Family Medicine

## 2019-04-17 NOTE — Telephone Encounter (Signed)
Pt needing refills on:  metFORMIN (GLUCOPHAGE) 1000 MG tablet  Please call into:  [place], [place] [phone] (Phone) [phone] (Fax)   Pt also needing free samples of Steglatro.  Please call pt back at [phone] to let him know when they can be picked up.  Thanks, [employer]

## 2019-04-18 NOTE — Telephone Encounter (Signed)
RX sent to pharmacy on 11/9. No Steglatro 15 mg samples available. Left patient a message advising him.

## 2019-04-27 DIAGNOSIS — S89292A Other physeal fracture of upper end of left fibula, initial encounter for closed fracture: Secondary | ICD-10-CM | POA: Diagnosis not present

## 2019-05-17 ENCOUNTER — Other Ambulatory Visit: Payer: Self-pay | Admitting: Family Medicine

## 2019-05-17 DIAGNOSIS — E1165 Type 2 diabetes mellitus with hyperglycemia: Secondary | ICD-10-CM

## 2019-05-18 NOTE — Progress Notes (Addendum)
Subjective:   Taylor [name] is a 63 y.o. male who presents for an Initial Medicare Annual Wellness Visit.    This visit is being conducted through telemedicine due to the COVID-19 pandemic. This patient has given me verbal consent via doximity to conduct this visit, patient states they are participating from their home address. Some vital signs may be absent or patient reported.    Patient identification: identified by name, DOB, and current address  Review of Systems  N/A  Cardiac Risk Factors include: advanced age (>50mn, >>17women);diabetes mellitus;dyslipidemia;hypertension;male gender    Objective:    Today's Vitals   05/22/19 1522  PainSc: 7    There is no height or weight on file to calculate BMI. Unable to obtain vitals due to visit being conducted via telephonically.   Advanced Directives 05/22/2019 02/16/2018  Does Patient Have a Medical Advance Directive? No No  Would patient like information on creating a medical advance directive? No - Patient declined No - Patient declined    Current Medications (verified) Outpatient Encounter Medications as of 05/22/2019  Medication Sig  . aspirin 81 MG chewable tablet 81 mg once daily.  .Marland KitchenglipiZIDE (GLUCOTROL XL) 10 MG 24 hr tablet TAKE 1 TABLET BY MOUTH ONCE DAILY.  .Marland KitchenHYDROmorphone (DILAUDID) 4 MG tablet Take 4 mg by mouth every 6 (six) hours as needed (for break through pain).   .Marland Kitchenlisinopril (PRINIVIL,ZESTRIL) 10 MG tablet TAKE 1 TABLET BY MOUTH ONCE DAILY.  .Marland Kitchenlubiprostone (AMITIZA) 24 MCG capsule Take 1 capsule (24 mcg total) by mouth 2 (two) times daily with a meal.  . meloxicam (MOBIC) 7.5 MG tablet Take 7.5 mg by mouth 2 (two) times daily.  .Marland KitchenNARCAN 4 MG/0.1ML LIQD nasal spray kit   . OXYCONTIN 30 MG 12 hr tablet Take 30 mg by mouth 4 (four) times daily as needed.   . pioglitazone (ACTOS) 45 MG tablet TAKE 1 TABLET BY MOUTH ONCE DAILY.  .Marland Kitchenpregabalin (LYRICA) 100 MG capsule Take 100 mg by mouth 3 (three) times  daily.   . simvastatin (ZOCOR) 40 MG tablet TAKE 1 TABLET BY MOUTH ONCE DAILY.  .Marland Kitchentestosterone (ANDRODERM) 4 MG/24HR PT24 patch Place 1 patch onto the skin daily.  .Marland Kitchenzolpidem (AMBIEN CR) 12.5 MG CR tablet AMBIEN CR, 12.5MG (Oral Tablet Extended Release)  1 PO QHS for 0 days  Quantity: 30.00;  Refills: 0   Ordered :15-Jul-2010  SReginia FortsMD;  Started 03-Mar-2007 Active  . Ertugliflozin L-PyroglutamicAc (STEGLATRO) 15 MG TABS Take 15 mg by mouth daily. (Patient not taking: Reported on 05/22/2019)  . metFORMIN (GLUCOPHAGE) 1000 MG tablet TAKE 1 TABLET BY MOUTH TWICE DAILY (Patient not taking: Reported on 05/22/2019)  . oxyCODONE (OXYCONTIN) 40 mg 12 hr tablet Take 40 mg by mouth every 12 (twelve) hours. As needed for pain.  . pregabalin (LYRICA) 75 MG capsule Take 100 mg by mouth 3 (three) times daily.    No facility-administered encounter medications on file as of 05/22/2019.    Allergies (verified) Other   History: Past Medical History:  Diagnosis Date  . Arthritis   . Broken leg 02/07/2019  . Depression   . [place]   . Hypertension    Past Surgical History:  Procedure Laterality Date  . BACK SURGERY     Family History  Problem Relation Age of Onset  . Diabetes Mother   . Diabetes Sister    Social History   Socioeconomic History  . Marital status:  Single    Spouse name: Not on file  . Number of children: 1  . Years of education: Not on file  . Highest education level: Some college, no degree  Occupational History  . Occupation: disability/retired  Tobacco Use  . Smoking status: Never Smoker  . Smokeless tobacco: Never Used  Substance and Sexual Activity  . Alcohol use: No    Alcohol/week: 0.0 standard drinks  . Drug use: No  . Sexual activity: Not on file  Other Topics Concern  . Not on file  Social History Narrative  . Not on file   Social Determinants of Health   Financial Resource Strain:   . Difficulty of  Paying Living Expenses: Not on file  Food Insecurity:   . Worried About Charity fundraiser in the Last Year: Not on file  . Ran Out of Food in the Last Year: Not on file  Transportation Needs:   . Lack of Transportation (Medical): Not on file  . Lack of Transportation (Non-Medical): Not on file  Physical Activity:   . Days of Exercise per Week: Not on file  . Minutes of Exercise per Session: Not on file  Stress:   . Feeling of Stress : Not on file  Social Connections:   . Frequency of Communication with Friends and Family: Not on file  . Frequency of Social Gatherings with Friends and Family: Not on file  . Attends Religious Services: Not on file  . Active Member of Clubs or Organizations: Not on file  . Attends Archivist Meetings: Not on file  . Marital Status: Not on file   Tobacco Counseling Counseling given: Not Answered   Clinical Intake:  Pre-visit preparation completed: Yes  Pain : 0-10 Pain Score: 7  Pain Type: Chronic pain Pain Location: Back Pain Orientation: Lower Pain Descriptors / Indicators: Aching Pain Frequency: Constant     Nutritional Risks: None Diabetes: Yes  How often do you need to have someone help you when you read instructions, pamphlets, or other written materials from your doctor or pharmacy?: 1 - Never   Diabetes:  Is the patient diabetic?  Yes type 2 If diabetic, was a CBG obtained today?  No  Did the patient bring in their glucometer from home?  No  How often do you monitor your CBG's? Once a day five times a week.   Financial Strains and Diabetes Management:  Are you having any financial strains with the device, your supplies or your medication? No .  Does the patient want to be seen by Chronic Care Management for management of their diabetes?  No  Would the patient like to be referred to a Nutritionist or for Diabetic Management?  No   Diabetic Exams:  Diabetic Eye Exam: Completed 04/01/18. Overdue for diabetic eye  exam. Pt has been advised about the importance in completing this exam.   Diabetic Foot Exam: Completed 11/15/14. Pt has been advised about the importance in completing this exam. Note made to follow up on this at next in office apt.    Interpreter Needed?: No  Information entered by :: [facility], LPN  Activities of Daily Living In your present state of health, do you have any difficulty performing the following activities: 05/22/2019  Hearing? Y  Comment Does not wear hearing aids.  Vision? N  Difficulty concentrating or making decisions? N  Walking or climbing stairs? Y  Comment Due to back/hip pain.  Dressing or bathing? N  Doing errands, shopping? N  Preparing Food and eating ? N  Using the Toilet? N  In the past six months, have you accidently leaked urine? N  Do you have problems with loss of bowel control? N  Managing your Medications? N  Managing your Finances? N  Housekeeping or managing your Housekeeping? N  Some recent data might be hidden     Immunizations and Health Maintenance Immunization History  Administered Date(s) Administered  . Influenza,inj,Quad PF,6+ Mos 03/13/2019  . Pneumococcal Polysaccharide-23 11/27/2013  . Tdap 11/25/2012   Health Maintenance Due  Topic Date Due  . HIV Screening  03/27/1972  . Fecal DNA (Cologuard)  03/28/2007  . FOOT EXAM  11/15/2015  . COLON CANCER SCREENING ANNUAL FOBT  08/21/2017  . OPHTHALMOLOGY EXAM  04/02/2019    Patient Care Team: Birdie Sons, MD as PCP - General (Family Medicine) Thalia Bloodgood, MD as Referring Physician (Anesthesiology) Horald Pollen, Student-RN as Student Nurse (Student) Birder Robson, MD as Referring Physician (Ophthalmology)  Indicate any recent Medical Services you may have received from other than [facility] providers in the past year (date may be approximate).    Assessment:   This is a routine wellness examination for Sylvio.  Hearing/Vision screen No exam data present   Dietary issues and exercise activities discussed: Current Exercise Habits: The patient does not participate in regular exercise at present, Exercise limited by: orthopedic condition(s)  Goals    . LIFESTYLE - DECREASE FALLS RISK     Recommend to remove any items from the home that may cause slips or trips.      Depression Screen PHQ 2/9 Scores 05/22/2019 04/07/2018 02/16/2018 01/19/2018  PHQ - 2 Score _0 PHQ- 9 Score _1 Fall Risk Fall Risk  05/22/2019 04/07/2018 02/16/2018 07/30/2016  Falls in the past year? 1 No No No  Number falls in past yr: 0 - - -  Injury with Fall? 0 - - -  Follow up Falls prevention discussed - - -    FALL RISK PREVENTION PERTAINING TO THE HOME:  Any stairs in or around the home? Yes  If so, are there any without handrails? No   Home free of loose throw rugs in walkways, pet beds, electrical cords, etc? Yes  Adequate lighting in your home to reduce risk of falls? Yes   ASSISTIVE DEVICES UTILIZED TO PREVENT FALLS:  Life alert? No  Use of a cane, walker or w/c? No  Grab bars in the bathroom? No  Shower chair or bench in shower? No  Elevated toilet seat or a handicapped toilet? No    TIMED UP AND GO:  Was the test performed? No .    Cognitive Function:        Screening Tests Health Maintenance  Topic Date Due  . HIV Screening  03/27/1972  . Fecal DNA (Cologuard)  03/28/2007  . FOOT EXAM  11/15/2015  . COLON CANCER SCREENING ANNUAL FOBT  08/21/2017  . OPHTHALMOLOGY EXAM  04/02/2019  . HEMOGLOBIN A1C  09/11/2019  . TETANUS/TDAP  11/26/2022  . INFLUENZA VACCINE  Completed  . PNEUMOCOCCAL POLYSACCHARIDE VACCINE AGE 79-64 HIGH RISK  Completed  . Hepatitis C Screening  Completed    Qualifies for Shingles Vaccine? Yes . Due for Shingrix. Pt has been advised to call insurance company to determine out of pocket expense. Advised may also receive vaccine at local pharmacy or Health Dept. Verbalized acceptance and understanding.   Tdap: Up to date  Flu  Vaccine: Up to date  Cancer Screenings:  Colorectal Screening: Completed 04/13/13, however were unable to obtain results.. Declined colonoscopy referral. Agreed to cologuard. Ordered today.   Lung Cancer Screening: (Low Dose CT Chest recommended if Age 62-80 years, 30 pack-year currently smoking OR have quit w/in 15years.) does not qualify.   Additional Screening:  Hepatitis C Screening: Up to date  Dental Screening: Recommended annual dental exams for proper oral hygiene  Community Resource Referral:  CRR required this visit?  No        Plan:  I have personally reviewed and addressed the Medicare Annual Wellness questionnaire and have noted the following in the patient's chart:  A. Medical and social history B. Use of alcohol, tobacco or illicit drugs  C. Current medications and supplements D. Functional ability and status E.  Nutritional status F.  Physical activity G. Advance directives H. List of other physicians I.  Hospitalizations, surgeries, and ER visits in previous 12 months J.  [place] such as hearing and vision if needed, cognitive and depression L. Referrals and appointments   In addition, I have reviewed and discussed with patient certain preventive protocols, quality metrics, and best practice recommendations. A written personalized care plan for preventive services as well as general preventive health recommendations were provided to patient.   [place] Score, [place]   [id number]  Nurse Health Advisor    Nurse Notes: Pt needs a diabetic foot exam and HIV lab test at next in office apt. Pt plans to schedule an eye exam soon. Cologuard ordered today.

## 2019-05-22 ENCOUNTER — Ambulatory Visit (INDEPENDENT_AMBULATORY_CARE_PROVIDER_SITE_OTHER): Payer: Medicare Other

## 2019-05-22 ENCOUNTER — Other Ambulatory Visit: Payer: Self-pay

## 2019-05-22 DIAGNOSIS — Z1211 Encounter for screening for malignant neoplasm of colon: Secondary | ICD-10-CM | POA: Diagnosis not present

## 2019-05-22 DIAGNOSIS — Z0000 Encounter for general adult medical examination without abnormal findings: Secondary | ICD-10-CM | POA: Diagnosis not present

## 2019-05-22 NOTE — Patient Instructions (Signed)
Taylor [name] , Thank you for taking time to come for your Medicare Wellness Visit. I appreciate your ongoing commitment to your health goals. Please review the following plan we discussed and let me know if I can assist you in the future.   Screening recommendations/referrals: Colonoscopy: Currently due- cologuard ordered today. Declined colonoscopy referral.  Recommended yearly ophthalmology/optometry visit for glaucoma screening and checkup Recommended yearly dental visit for hygiene and checkup  Vaccinations: Influenza vaccine: Up to date Tdap vaccine: Up to date, due 11/2022 Shingles vaccine: Pt declines today.     Advanced directives: Please bring a copy of your POA (Power of Attorney) and/or Living Will to your next appointment.   Conditions/risks identified: Fall risk prevention discussed today.   Next appointment: 07/12/19 @ 10:00 AM with Dr Caryn Section.   Preventive Care 40-64 Years, Male Preventive care refers to lifestyle choices and visits with your health care provider that can promote health and wellness. What does preventive care include?  A yearly physical exam. This is also called an annual well check.  Dental exams once or twice a year.  Routine eye exams. Ask your health care provider how often you should have your eyes checked.  Personal lifestyle choices, including:  Daily care of your teeth and gums.  Regular physical activity.  Eating a healthy diet.  Avoiding tobacco and drug use.  Limiting alcohol use.  Practicing safe sex.  Taking low-dose aspirin every day starting at age 78. What happens during an annual well check? The services and screenings done by your health care provider during your annual well check will depend on your age, overall health, lifestyle risk factors, and family history of disease. Counseling  Your health care provider may ask you questions about your:  Alcohol use.  Tobacco use.  Drug use.  Emotional well-being.  Home and  relationship well-being.  Sexual activity.  Eating habits.  Work and work Statistician. Screening  You may have the following tests or measurements:  Height, weight, and BMI.  Blood pressure.  Lipid and cholesterol levels. These may be checked every 5 years, or more frequently if you are over 65 years old.  Skin check.  Lung cancer screening. You may have this screening every year starting at age 71 if you have a 30-pack-year history of smoking and currently smoke or have quit within the past 15 years.  Fecal occult blood test (FOBT) of the stool. You may have this test every year starting at age 77.  Flexible sigmoidoscopy or colonoscopy. You may have a sigmoidoscopy every 5 years or a colonoscopy every 10 years starting at age 41.  Prostate cancer screening. Recommendations will vary depending on your family history and other risks.  Hepatitis C blood test.  Hepatitis B blood test.  Sexually transmitted disease (STD) testing.  Diabetes screening. This is done by checking your blood sugar (glucose) after you have not eaten for a while (fasting). You may have this done every 1-3 years. Discuss your test results, treatment options, and if necessary, the need for more tests with your health care provider. Vaccines  Your health care provider may recommend certain vaccines, such as:  Influenza vaccine. This is recommended every year.  Tetanus, diphtheria, and acellular pertussis (Tdap, Td) vaccine. You may need a Td booster every 10 years.  Zoster vaccine. You may need this after age 3.  Pneumococcal 13-valent conjugate (PCV13) vaccine. You may need this if you have certain conditions and have not been vaccinated.  Pneumococcal polysaccharide (PPSV23)  vaccine. You may need one or two doses if you smoke cigarettes or if you have certain conditions. Talk to your health care provider about which screenings and vaccines you need and how often you need them. This information is  not intended to replace advice given to you by your health care provider. Make sure you discuss any questions you have with your health care provider. Document Released: 06/21/2015 Document Revised: 02/12/2016 Document Reviewed: 03/26/2015 Elsevier Interactive Patient Education  2017 [employer].  Fall Prevention in the Home Falls can cause injuries. They can happen to people of all ages. There are many things you can do to make your home safe and to help prevent falls. What can I do on the outside of my home?  Regularly fix the edges of walkways and driveways and fix any cracks.  Remove anything that might make you trip as you walk through a door, such as a raised step or threshold.  Trim any bushes or trees on the path to your home.  Use bright outdoor lighting.  Clear any walking paths of anything that might make someone trip, such as rocks or tools.  Regularly check to see if handrails are loose or broken. Make sure that both sides of any steps have handrails.  Any raised decks and porches should have guardrails on the edges.  Have any leaves, snow, or ice cleared regularly.  Use sand or salt on walking paths during winter.  Clean up any spills in your garage right away. This includes oil or grease spills. What can I do in the bathroom?  Use night lights.  Install grab bars by the toilet and in the tub and shower. Do not use towel bars as grab bars.  Use non-skid mats or decals in the tub or shower.  If you need to sit down in the shower, use a plastic, non-slip stool.  Keep the floor dry. Clean up any water that spills on the floor as soon as it happens.  Remove soap buildup in the tub or shower regularly.  Attach bath mats securely with double-sided non-slip rug tape.  Do not have throw rugs and other things on the floor that can make you trip. What can I do in the bedroom?  Use night lights.  Make sure that you have a light by your bed that is easy to reach.   Do not use any sheets or blankets that are too big for your bed. They should not hang down onto the floor.  Have a firm chair that has side arms. You can use this for support while you get dressed.  Do not have throw rugs and other things on the floor that can make you trip. What can I do in the kitchen?  Clean up any spills right away.  Avoid walking on wet floors.  Keep items that you use a lot in easy-to-reach places.  If you need to reach something above you, use a strong step stool that has a grab bar.  Keep electrical cords out of the way.  Do not use floor polish or wax that makes floors slippery. If you must use wax, use non-skid floor wax.  Do not have throw rugs and other things on the floor that can make you trip. What can I do with my stairs?  Do not leave any items on the stairs.  Make sure that there are handrails on both sides of the stairs and use them. Fix handrails that are broken or  loose. Make sure that handrails are as long as the stairways.  Check any carpeting to make sure that it is firmly attached to the stairs. Fix any carpet that is loose or worn.  Avoid having throw rugs at the top or bottom of the stairs. If you do have throw rugs, attach them to the floor with carpet tape.  Make sure that you have a light switch at the top of the stairs and the bottom of the stairs. If you do not have them, ask someone to add them for you. What else can I do to help prevent falls?  Wear shoes that:  Do not have high heels.  Have rubber bottoms.  Are comfortable and fit you well.  Are closed at the toe. Do not wear sandals.  If you use a stepladder:  Make sure that it is fully opened. Do not climb a closed stepladder.  Make sure that both sides of the stepladder are locked into place.  Ask someone to hold it for you, if possible.  Clearly mark and make sure that you can see:  Any grab bars or handrails.  First and last steps.  Where the edge of  each step is.  Use tools that help you move around (mobility aids) if they are needed. These include:  Canes.  Walkers.  Scooters.  Crutches.  Turn on the lights when you go into a dark area. Replace any light bulbs as soon as they burn out.  Set up your furniture so you have a clear path. Avoid moving your furniture around.  If any of your floors are uneven, fix them.  If there are any pets around you, be aware of where they are.  Review your medicines with your doctor. Some medicines can make you feel dizzy. This can increase your chance of falling. Ask your doctor what other things that you can do to help prevent falls. This information is not intended to replace advice given to you by your health care provider. Make sure you discuss any questions you have with your health care provider. Document Released: 03/21/2009 Document Revised: 10/31/2015 Document Reviewed: 06/29/2014 Elsevier Interactive Patient Education  2017 [employer].

## 2019-06-13 ENCOUNTER — Other Ambulatory Visit: Payer: Self-pay | Admitting: Family Medicine

## 2019-07-05 ENCOUNTER — Other Ambulatory Visit: Payer: Self-pay | Admitting: Family Medicine

## 2019-07-05 DIAGNOSIS — I10 Essential (primary) hypertension: Secondary | ICD-10-CM

## 2019-07-12 ENCOUNTER — Encounter: Payer: Self-pay | Admitting: Family Medicine

## 2019-07-14 ENCOUNTER — Other Ambulatory Visit: Payer: Self-pay | Admitting: Family Medicine

## 2019-07-14 ENCOUNTER — Other Ambulatory Visit: Payer: Self-pay

## 2019-07-14 DIAGNOSIS — E1165 Type 2 diabetes mellitus with hyperglycemia: Secondary | ICD-10-CM

## 2019-07-14 NOTE — Patient Outreach (Signed)
[facility] Customer service manager [facility]) Care Management  07/14/2019  Doralee Albino [name] 12/11/1955 [id number]   .Medication Adherence call to mr. Taylor [name] Telephone call to Patient regarding Medication Adherence unable to reach patient. Patient did not answer patient is past due on Pioglitazone 45 mg and Glipizide Er 10 mg under [employer].   Lillia Abed CPhT Pharmacy Technician [facility] Management Direct Dial [phone]  Fax [phone] [email or web address]

## 2019-07-18 ENCOUNTER — Ambulatory Visit: Payer: Self-pay | Admitting: Family Medicine

## 2019-08-02 ENCOUNTER — Other Ambulatory Visit: Payer: Self-pay | Admitting: Family Medicine

## 2019-08-02 DIAGNOSIS — I10 Essential (primary) hypertension: Secondary | ICD-10-CM

## 2019-08-02 NOTE — Telephone Encounter (Signed)
Requested Prescriptions  Pending Prescriptions Disp Refills  . lisinopril (ZESTRIL) 10 MG tablet [Pharmacy Med Name: LISINOPRIL 10 MG TABLET] 30 tablet 0    Sig: TAKE 1 TABLET BY MOUTH ONCE DAILY.     Cardiovascular:  ACE Inhibitors Passed - 08/02/2019 10:42 AM      Passed - Cr in normal range and within 180 days    Creatinine, Ser  Date Value Ref Range Status  03/13/2019 0.96 0.76 - 1.27 mg/dL Final   Creatinine, POC  Date Value Ref Range Status  03/31/2017 n/a mg/dL Final         Passed - K in normal range and within 180 days    Potassium  Date Value Ref Range Status  03/13/2019 4.9 3.5 - 5.2 mmol/L Final         Passed - Patient is not pregnant      Passed - Last BP in normal range    BP Readings from Last 1 Encounters:  03/13/19 132/82         Passed - Valid encounter within last 6 months    Recent Outpatient Visits          4 months ago Type 2 diabetes mellitus without complication, without long-term current use of insulin (HCC)   [facility] Malva Limes, MD   8 months ago Uncontrolled type 2 diabetes mellitus with hyperglycemia [facility])   [facility] Malva Limes, MD   1 year ago Uncontrolled type 2 diabetes mellitus with hyperglycemia [facility])   [facility] Malva Limes, MD   1 year ago Uncontrolled type 2 diabetes mellitus with hyperglycemia [facility])   [facility] Malva Limes, MD   1 year ago Annual physical exam   [facility] Malva Limes, MD

## 2019-08-14 ENCOUNTER — Other Ambulatory Visit: Payer: Self-pay | Admitting: Family Medicine

## 2019-08-14 NOTE — Telephone Encounter (Signed)
Requested Prescriptions  Pending Prescriptions Disp Refills  . metFORMIN (GLUCOPHAGE) 1000 MG tablet [Pharmacy Med Name: METFORMIN HCL 1,000 MG TABLET] 120 tablet 0    Sig: TAKE 1 TABLET BY MOUTH TWICE DAILY     Endocrinology:  Diabetes - Biguanides Passed - 08/14/2019 10:30 AM      Passed - Cr in normal range and within 360 days    Creatinine, Ser  Date Value Ref Range Status  03/13/2019 0.96 0.76 - 1.27 mg/dL Final   Creatinine, POC  Date Value Ref Range Status  03/31/2017 n/a mg/dL Final         Passed - HBA1C is between 0 and 7.9 and within 180 days    Hemoglobin A1C  Date Value Ref Range Status  03/13/2019 7.1 (A) 4.0 - 5.6 % Final   Hgb A1c MFr Bld  Date Value Ref Range Status  07/30/2016 11.0 (H) 4.8 - 5.6 % Final    Comment:             Pre-diabetes: 5.7 - 6.4          Diabetes: >6.4          Glycemic control for adults with diabetes: <7.0          Passed - eGFR in normal range and within 360 days    GFR calc Af Amer  Date Value Ref Range Status  03/13/2019 98 >59 mL/min/1.73 Final   GFR calc non Af Amer  Date Value Ref Range Status  03/13/2019 85 >59 mL/min/1.73 Final         Passed - Valid encounter within last 6 months    Recent Outpatient Visits          5 months ago Type 2 diabetes mellitus without complication, without long-term current use of insulin (HCC)   [facility] Fisher, Donald E, MD   9 months ago Uncontrolled type 2 diabetes mellitus with hyperglycemia (HCC)   [facility] Fisher, Donald E, MD   1 year ago Uncontrolled type 2 diabetes mellitus with hyperglycemia (HCC)   [facility] Fisher, Donald E, MD   1 year ago Uncontrolled type 2 diabetes mellitus with hyperglycemia (HCC)   [facility] Fisher, Donald E, MD   1 year ago Annual physical exam   [facility] Fisher, Donald E, MD              

## 2019-09-01 ENCOUNTER — Other Ambulatory Visit: Payer: Self-pay | Admitting: Family Medicine

## 2019-09-01 DIAGNOSIS — I10 Essential (primary) hypertension: Secondary | ICD-10-CM

## 2019-09-13 ENCOUNTER — Other Ambulatory Visit: Payer: Self-pay | Admitting: Family Medicine

## 2019-09-13 NOTE — Telephone Encounter (Signed)
Patient has appointment-09/25/19- RF per protocol

## 2019-09-22 NOTE — Progress Notes (Signed)
I,Roshena L Chambers,acting as a scribe for Lelon Huh, MD.,have documented all relevant documentation on the behalf of Lelon Huh, MD,as directed by  Lelon Huh, MD while in the presence of Lelon Huh, MD.  Complete physical exam    Patient: Taylor [name]   DOB: June 07, 1956   64 y.o. Male  MRN: [id number] Visit Date: 09/25/2019  Today's healthcare provider: Lelon Huh, MD  Subjective:    Chief Complaint  Patient presents with  . Annual Exam  . Diabetes  . Hypertension    Had AWV with [facility] on 05/22/2019.  Taylor [name] is a 64 y.o. male who presents today for a complete physical exam.  He reports consuming a general diet. The patient does not participate in regular exercise at present. He generally feels fairly well. He reports sleeping poorly. He does have additional problems to discuss today.  HPI  Hypertension, follow-up  BP Readings from Last 3 Encounters:  09/25/19 (!) 150/82  03/13/19 132/82  11/07/18 (!) 148/96   Wt Readings from Last 3 Encounters:  09/25/19 (!) 359 lb (162.8 kg)  03/13/19 (!) 347 lb (157.4 kg)  11/07/18 (!) 330 lb (149.7 kg)   He was last seen for hypertension 6 months ago.  BP at that visit was 132/82. Management since that visit includes no changes. He reports good compliance with treatment. He is not having side effects.  He is not exercising. He is not adherent to low salt diet.   Outside blood pressures are not checked. Symptoms: No chest pain No chest pressure/discomfort No dyspnea (difficulty breathing) No lower extremity edema No orthopnea No palpitations No paroxysmal nocturnal dyspnea  No syncope  He does not smoke. He is following a Regular diet. Use of agents associated with hypertension: NSAIDS.   Lab Results  Component Value Date   NA 138 03/13/2019   K 4.9 03/13/2019   CL 99 03/13/2019   CO2 23 03/13/2019   GLUCOSE 99 03/13/2019   BUN 15 03/13/2019   CREATININE 0.96 03/13/2019   CALCIUM 9.3 03/13/2019   GFRNONAA 85 03/13/2019   GFRAA 98 03/13/2019    Lab Results  Component Value Date   CHOL 155 03/13/2019   HDL 53 03/13/2019   LDLCALC 84 03/13/2019   TRIG 97 03/13/2019   CHOLHDL 2.9 03/13/2019      The 10-year ASCVD risk score Mikey Bussing DC Jr., et al., 2013) is: 22%   ----------------------------------------------------------------------------------------- Diabetes Mellitus Type II, Follow-up  Lab Results  Component Value Date   HGBA1C 7.1 (A) 03/13/2019   HGBA1C 8.5 (A) 11/07/2018   HGBA1C 8.2 (A) 07/07/2018   Last seen for diabetes 6 months ago.  Management since then includes giving samples of Steglatro14m daily. He reports fair compliance with treatment. Patient has been out of Steglatro for 6 months due to cost.  He is not having side effects.  Symptoms: No fatigue No foot ulcerations No appetite changes No nausea No paresthesia (numbness or tingling) of the feet  No polydipsia (excessive thirst) No polyuria (frequent urination) No visual disturbances  No vomiting  Home blood sugar records: fasting range: 150-200  Episodes of hypoglycemia? No    Current insulin regiment: none Most Recent Eye Exam: not UTD Current exercise: none Current diet habits: regular diet  Wt Readings from Last 3 Encounters:  09/25/19 (!) 359 lb (162.8 kg)  03/13/19 (!) 347 lb (157.4 kg)  11/07/18 (!) 330 lb (149.7 kg)    ----------------------------------------------------------------------------------------- He is also due for follow  up of hypogonadism and was changed from IM to topical testosterone last year, however he states he stopped using patches because they would not stay on. He wants to go back to injections which worked well for him, he just gets very anxious to give himself injectinos.   Past Medical History:  Diagnosis Date  . Arthritis   . Broken leg 02/07/2019  . Depression   . Diabetes mellitus without complication ([place])   . Hypertension     Past Surgical History:  Procedure Laterality Date  . BACK SURGERY     Social History   Socioeconomic History  . Marital status: Single    Spouse name: Not on file  . Number of children: 1  . Years of education: Not on file  . Highest education level: Some college, no degree  Occupational History  . Occupation: disability/retired  Tobacco Use  . Smoking status: Never Smoker  . Smokeless tobacco: Never Used  Substance and Sexual Activity  . Alcohol use: No    Alcohol/week: 0.0 standard drinks  . Drug use: No  . Sexual activity: Not on file  Other Topics Concern  . Not on file  Social History Narrative  . Not on file   Social Determinants of Health   Financial Resource Strain:   . Difficulty of Paying Living Expenses:   Food Insecurity:   . Worried About Charity fundraiser in the Last Year:   . Arboriculturist in the Last Year:   Transportation Needs:   . Film/video editor (Medical):   Marland Kitchen Lack of Transportation (Non-Medical):   Physical Activity:   . Days of Exercise per Week:   . Minutes of Exercise per Session:   Stress:   . Feeling of Stress :   Social Connections:   . Frequency of Communication with Friends and Family:   . Frequency of Social Gatherings with Friends and Family:   . Attends Religious Services:   . Active Member of Clubs or Organizations:   . Attends Archivist Meetings:   Marland Kitchen Marital Status:   Intimate Partner Violence:   . Fear of Current or Ex-Partner:   . Emotionally Abused:   Marland Kitchen Physically Abused:   . Sexually Abused:    Family Status  Relation Name Status  . Mother  Alive  . Father  Other  . Sister  Alive  . Brother  Alive   Family History  Problem Relation Age of Onset  . Diabetes Mother   . Diabetes Sister    Allergies  Allergen Reactions  . Other Itching    Pain medication (brand named)     Patient Care Team: Birdie Sons, MD as PCP - General (Family Medicine) Thalia Bloodgood, MD as Referring  Physician (Anesthesiology) Horald Pollen, Student-RN as Student Nurse (Student) Birder Robson, MD as Referring Physician (Ophthalmology)   Medications: Outpatient Medications Prior to Visit  Medication Sig Note  . meloxicam (MOBIC) 7.5 MG tablet Take 7.5 mg by mouth 2 (two) times daily.   . metFORMIN (GLUCOPHAGE) 1000 MG tablet TAKE 1 TABLET BY MOUTH TWICE DAILY   . OXYCONTIN 30 MG 12 hr tablet Take 30 mg by mouth 4 (four) times daily as needed.    . pioglitazone (ACTOS) 45 MG tablet TAKE 1 TABLET BY MOUTH ONCE DAILY.   Marland Kitchen pregabalin (LYRICA) 100 MG capsule Take 100 mg by mouth 3 (three) times daily.    . simvastatin (ZOCOR) 40 MG tablet TAKE 1 TABLET BY  MOUTH ONCE DAILY.   Marland Kitchen zolpidem (AMBIEN CR) 12.5 MG CR tablet AMBIEN CR, 12.5MG (Oral Tablet Extended Release)  1 PO QHS for 0 days  Quantity: 30.00;  Refills: 0   Ordered :15-Jul-2010  Reginia Forts MD;  Started 03-Mar-2007 Active   . aspirin 81 MG chewable tablet 81 mg once daily.   Marland Kitchen glipiZIDE (GLUCOTROL XL) 10 MG 24 hr tablet TAKE 1 TABLET BY MOUTH ONCE DAILY.   Marland Kitchen HYDROmorphone (DILAUDID) 4 MG tablet Take 4 mg by mouth every 6 (six) hours as needed (for break through pain).    Marland Kitchen lisinopril (ZESTRIL) 10 MG tablet TAKE 1 TABLET BY MOUTH ONCE DAILY.   Marland Kitchen NARCAN 4 MG/0.1ML LIQD nasal spray kit    . [DISCONTINUED] Ertugliflozin L-PyroglutamicAc (STEGLATRO) 15 MG TABS Take 15 mg by mouth daily. (Patient not taking: Reported on 05/22/2019)   . [DISCONTINUED] lubiprostone (AMITIZA) 24 MCG capsule Take 1 capsule (24 mcg total) by mouth 2 (two) times daily with a meal. (Patient not taking: Reported on 09/25/2019) 09/25/2019: ran out and was too expensive  . [DISCONTINUED] oxyCODONE (OXYCONTIN) 40 mg 12 hr tablet Take 40 mg by mouth every 12 (twelve) hours. As needed for pain.   . [DISCONTINUED] pregabalin (LYRICA) 75 MG capsule Take 100 mg by mouth 3 (three) times daily.    . [DISCONTINUED] testosterone (ANDRODERM) 4 MG/24HR PT24 patch  Place 1 patch onto the skin daily. (Patient not taking: Reported on 09/25/2019)    No facility-administered medications prior to visit.    Review of Systems  Constitutional: Positive for diaphoresis and fatigue. Negative for appetite change, chills and fever.  HENT: Positive for tinnitus. Negative for congestion, ear pain, hearing loss, nosebleeds and trouble swallowing.   Eyes: Negative for pain and visual disturbance.  Respiratory: Positive for shortness of breath. Negative for cough and chest tightness.   Cardiovascular: Negative for chest pain, palpitations and leg swelling (in ankles).  Gastrointestinal: Positive for abdominal distention and constipation. Negative for abdominal pain, blood in stool, diarrhea, nausea and vomiting.  Endocrine: Negative for polydipsia, polyphagia and polyuria.  Genitourinary: Negative for dysuria and flank pain.  Musculoskeletal: Negative for arthralgias, back pain, joint swelling, myalgias and neck stiffness.  Skin: Negative for color change, rash and wound.  Neurological: Negative for dizziness, tremors, seizures, speech difficulty, weakness, light-headedness and headaches.  Psychiatric/Behavioral: Negative for behavioral problems, confusion, decreased concentration, dysphoric mood and sleep disturbance. The patient is not nervous/anxious.   All other systems reviewed and are negative.       Objective:    BP (!) 150/82 (BP Location: Right Arm, Cuff Size: Large)   Pulse 94   Temp (!) 97.3 F (36.3 C) (Temporal)   Resp 16   Ht 6' (1.829 m)   Wt (!) 359 lb (162.8 kg)   SpO2 96% Comment: room air  BMI 48.69 kg/m    Physical Exam  BP (!) 150/82 (BP Location: Right Arm, Cuff Size: Large)   Pulse 94   Temp (!) 97.3 F (36.3 C) (Temporal)   Resp 16   Ht 6' (1.829 m)   Wt (!) 359 lb (162.8 kg)   SpO2 96% Comment: room air  BMI 48.69 kg/m    General Appearance:    Obese male. Alert, cooperative, in no acute distress, appears stated age    Head:    Normocephalic, without obvious abnormality, atraumatic  Eyes:    PERRL, conjunctiva/corneas clear, EOM's intact, fundi    benign, both eyes  Ears:    Normal TM's and external ear canals, both ears  Nose:   Nares normal, septum midline, mucosa normal, no drainage   or sinus tenderness  Throat:   Lips, mucosa, and tongue normal; teeth and gums normal  Neck:   Supple, symmetrical, trachea midline, no adenopathy;       thyroid:  No enlargement/tenderness/nodules; no carotid   bruit or JVD  Back:     Symmetric, no curvature, ROM normal, no CVA tenderness  Lungs:     Clear to auscultation bilaterally, respirations unlabored  Chest wall:    No tenderness or deformity  Heart:    Normal heart rate. Normal rhythm. No murmurs, rubs, or gallops.  S1 and S2 normal  Abdomen:     Soft, non-tender, bowel sounds active all four quadrants,    no masses, no organomegaly  Genitalia:    deferred  Rectal:    deferred  Extremities:   All extremities are intact. No cyanosis or edema  Pulses:   2+ and symmetric all extremities  Skin:   Skin color, texture, turgor normal, no rashes or lesions  Lymph nodes:   Cervical, supraclavicular, and axillary nodes normal  Neurologic:   CNII-XII intact. Normal strength, sensation and reflexes      throughout    Depression Screen  PHQ 2/9 Scores 09/25/2019 05/22/2019 04/07/2018  PHQ - 2 Score '3 4 2  ' PHQ- 9 Score '11 12 13    ' No results found for any visits on 09/25/19.    Assessment & Plan:    Routine Health Maintenance and Physical Exam  Exercise Activities and Dietary recommendations Goals    . LIFESTYLE - DECREASE FALLS RISK     Recommend to remove any items from the home that may cause slips or trips.       Immunization History  Administered Date(s) Administered  . Influenza,inj,Quad PF,6+ Mos 03/13/2019  . Pneumococcal Polysaccharide-23 11/27/2013  . Tdap 11/25/2012    Health Maintenance  Topic Date Due  . HIV Screening  Never done   . COVID-19 Vaccine (1) Never done  . Fecal DNA (Cologuard)  Never done  . FOOT EXAM  11/15/2015  . COLON CANCER SCREENING ANNUAL FOBT  08/21/2017  . OPHTHALMOLOGY EXAM  04/02/2019  . HEMOGLOBIN A1C  09/11/2019  . INFLUENZA VACCINE  01/07/2020  . TETANUS/TDAP  11/26/2022  . PNEUMOCOCCAL POLYSACCHARIDE VACCINE AGE 30-64 HIGH RISK  Completed  . Hepatitis C Screening  Completed    Discussed health benefits of physical activity, and encouraged him to engage in regular exercise appropriate for his age and condition.  1. Annual physical exam   2. Screening for colon cancer OC Light take home kit given to patient today.  3. Uncontrolled type 2 diabetes mellitus with hyperglycemia (HCC) Off steglatro due to cost.  - glucose blood (ONETOUCH ULTRA) test strip; Use to check sugar daily for type 2 diabetes E11.9  Dispense: 100 each; Refill: 4 - Comprehensive Metabolic Panel (CMET) - CBC - Lipid panel - HgB A1c  Given samples of Rybelsus 24m to take for one month. Will contact him 3 weeks. to see how he is tolerating medications. Recommend using OTC metamucil every day to prevent constipation.   4. Morbid obesity ([place] Expect some weight loss with semaglutide.   5. Hypogonadism, male Change back to injectable testosterone since the patches would not stay on. If he is unable to give himself injections, we will see if we can get testosterone gel covered.  - testosterone  cypionate (DEPOTESTOSTERONE CYPIONATE) 200 MG/ML injection; INJCET 2ML I.M. ONCE EVERY 14 DAYS.  Dispense: 10 mL; Refill: 3   Addressed extensive list of chronic and acute medical problems today requiring 25 minutes reviewing his medical record, counseling patient regarding his conditions and coordination of care in addition to time spent on routine annual physical     The entirety of the information documented in the History of Present Illness, Review of Systems and Physical Exam were personally obtained by me. Portions of  this information were initially documented by the CMA and reviewed by me for thoroughness and accuracy.      Lelon Huh, MD  [facility] [phone] (phone) [phone] (fax)  [place]

## 2019-09-25 ENCOUNTER — Other Ambulatory Visit: Payer: Self-pay

## 2019-09-25 ENCOUNTER — Encounter: Payer: Self-pay | Admitting: Family Medicine

## 2019-09-25 ENCOUNTER — Ambulatory Visit (INDEPENDENT_AMBULATORY_CARE_PROVIDER_SITE_OTHER): Payer: Medicare Other | Admitting: Family Medicine

## 2019-09-25 VITALS — BP 150/82 | HR 94 | Temp 97.3°F | Resp 16 | Ht 72.0 in | Wt 359.0 lb

## 2019-09-25 DIAGNOSIS — E291 Testicular hypofunction: Secondary | ICD-10-CM

## 2019-09-25 DIAGNOSIS — Z0000 Encounter for general adult medical examination without abnormal findings: Secondary | ICD-10-CM

## 2019-09-25 DIAGNOSIS — E1165 Type 2 diabetes mellitus with hyperglycemia: Secondary | ICD-10-CM

## 2019-09-25 DIAGNOSIS — Z1211 Encounter for screening for malignant neoplasm of colon: Secondary | ICD-10-CM | POA: Diagnosis not present

## 2019-09-25 MED ORDER — TESTOSTERONE CYPIONATE 200 MG/ML IM SOLN: INTRAMUSCULAR | 3 refills | Status: DC

## 2019-09-25 MED ORDER — ONETOUCH ULTRA VI STRP: ORAL_STRIP | 4 refills | Status: DC

## 2019-09-25 MED ORDER — RYBELSUS 3 MG PO TABS: 3.0000 mg | ORAL_TABLET | Freq: Every day | ORAL | 0 refills | Status: DC

## 2019-09-25 NOTE — Patient Instructions (Addendum)
.   Please review the attached list of medications and notify my office if there are any errors.   Please contact your eyecare professional to schedule a routine eye exam   Start taking metamucil powder with 1 heaping tablespoon mixed with water every evening. You can take Miralax in addition to it occasionally, but not every day   Start taking sample Rybelsus once a day. We will contact you before they run out to see how

## 2019-09-26 ENCOUNTER — Telehealth: Payer: Self-pay

## 2019-09-26 LAB — COMPREHENSIVE METABOLIC PANEL
ALT: 13 IU/L (ref 0–44)
AST: 15 IU/L (ref 0–40)
Albumin/Globulin Ratio: 1.4 (ref 1.2–2.2)
Albumin: 4.2 g/dL (ref 3.8–4.8)
Alkaline Phosphatase: 73 IU/L (ref 39–117)
BUN/Creatinine Ratio: 13 (ref 10–24)
BUN: 12 mg/dL (ref 8–27)
Bilirubin Total: 0.3 mg/dL (ref 0.0–1.2)
CO2: 23 mmol/L (ref 20–29)
Calcium: 9.1 mg/dL (ref 8.6–10.2)
Chloride: 102 mmol/L (ref 96–106)
Creatinine, Ser: 0.92 mg/dL (ref 0.76–1.27)
GFR calc Af Amer: 103 mL/min/{1.73_m2} (ref >59)
GFR calc non Af Amer: 89 mL/min/{1.73_m2} (ref >59)
Globulin, Total: 3 g/dL (ref 1.5–4.5)
Glucose: 113 mg/dL — ABNORMAL HIGH (ref 65–99)
Potassium: 4.4 mmol/L (ref 3.5–5.2)
Sodium: 140 mmol/L (ref 134–144)
Total Protein: 7.2 g/dL (ref 6.0–8.5)

## 2019-09-26 LAB — CBC
Hematocrit: 38.2 % (ref 37.5–51.0)
Hemoglobin: 12.5 g/dL — ABNORMAL LOW (ref 13.0–17.7)
MCH: 28.9 pg (ref 26.6–33.0)
MCHC: 32.7 g/dL (ref 31.5–35.7)
MCV: 88 fL (ref 79–97)
Platelets: 190 10*3/uL (ref 150–450)
RBC: 4.33 x10E6/uL (ref 4.14–5.80)
RDW: 13.7 % (ref 11.6–15.4)
WBC: 8 10*3/uL (ref 3.4–10.8)

## 2019-09-26 LAB — LIPID PANEL
Chol/HDL Ratio: 3 ratio (ref 0.0–5.0)
Cholesterol, Total: 140 mg/dL (ref 100–199)
HDL: 46 mg/dL (ref >39)
LDL Chol Calc (NIH): 75 mg/dL (ref 0–99)
Triglycerides: 106 mg/dL (ref 0–149)
VLDL Cholesterol Cal: 19 mg/dL (ref 5–40)

## 2019-09-26 LAB — HEMOGLOBIN A1C
Est. average glucose Bld gHb Est-mCnc: 151 mg/dL
Hgb A1c MFr Bld: 6.9 % — ABNORMAL HIGH (ref 4.8–5.6)

## 2019-09-26 NOTE — Telephone Encounter (Signed)
-----   Message from Malva Limes, MD sent at 09/26/2019  7:57 AM EDT ----- cholesterol kidney functions, electrolytes and cholesterol are all normal. A1c is good at 6.9.  Please schedule follow up in 3-4 months to check A1c and testosterone.

## 2019-09-26 NOTE — Telephone Encounter (Signed)
Needs to schedule office visit 

## 2019-09-26 NOTE — Telephone Encounter (Signed)
Pt given result, and recommendations per Dr Sherrie Mustache; he verbalized understanding; the pt would like to know if he needs to schedule or visit or just have labs drawn; he would also like to clarify if he should fast prior to; the pt can be contacted at  [phone], and a message can be left on the voicemail; the pt says any day is ok for appt, except Fridays; will route to office for final disposition.

## 2019-09-26 NOTE — Telephone Encounter (Signed)
LMTCB, [facility] Triage Nurse may give patient results  

## 2019-09-27 NOTE — Telephone Encounter (Signed)
Patient advised. Appointment scheduled for 01/23/2020 at 10:20am.

## 2019-10-30 ENCOUNTER — Other Ambulatory Visit: Payer: Self-pay | Admitting: Family Medicine

## 2019-10-30 ENCOUNTER — Ambulatory Visit: Payer: Self-pay | Admitting: *Deleted

## 2019-10-30 NOTE — Telephone Encounter (Signed)
Patient started taking Rybelsus on May 1st. He has nausea every day after taking this medication that sometimes lingers for hours. He is taking as prescribed on empty stomach with sips of water. Has cold symptoms at this time so scheduled virtual visit for tomorrow at 2:40p with PCP. Okay to use this appointment slot per [place].  Reason for Disposition . [1] Caller has NON-URGENT medication question about med that PCP prescribed AND [2] triager unable to answer question  Answer Assessment - Initial Assessment Questions 1. REASON FOR CALL or QUESTION: "What is your reason for calling today?" or "How can I best help you?" or "What question do you have that I can help answer?"    Started taking Rybelsus on May 1st has had nausea every morning while taking it.  Answer Assessment - Initial Assessment Questions 1.   NAME of MEDICATION: "What medicine are you calling about?"     Rybelsus 2.   QUESTION: "What is your question?"     Doesn't think he can continue to take it.  3.   PRESCRIBING HCP: "Who prescribed it?" Reason: if prescribed by specialist, call should be referred to that group.     Dr. Sherrie Mustache 4. SYMPTOMS: "Do you have any symptoms?"     Nausea daily 5. SEVERITY: If symptoms are present, ask "Are they mild, moderate or severe?" moderate 6.  PREGNANCY:  "Is there any chance that you are pregnant?" "When was your last menstrual period?"     na  Protocols used: MEDICATION QUESTION CALL-A-AH, INFORMATION ONLY CALL-A-AH

## 2019-10-30 NOTE — Telephone Encounter (Signed)
Call to patient. Confirmed virtual appointment for tomorrow @2 :40p with Dr. .

## 2019-10-30 NOTE — Progress Notes (Signed)
Virtual telephone visit    Virtual Visit via Telephone Note   This visit type was conducted due to national recommendations for restrictions regarding the COVID-19 Pandemic (e.g. social distancing) in an effort to limit this patient's exposure and mitigate transmission in our community. Due to his co-morbid illnesses, this patient is at least at moderate risk for complications without adequate follow up. This format is felt to be most appropriate for this patient at this time. The patient did not have access to video technology or had technical difficulties with video requiring transitioning to audio format only (telephone). Physical exam was limited to content and character of the telephone converstion.    Patient location: home Provider location: [facility]   Visit Date: 10/31/2019  Today's healthcare provider: Lelon Huh, MD   Chief Complaint  Patient presents with  . Medication Problem   I,Latasha Walston,acting as a scribe for Lelon Huh, MD.,have documented all relevant documentation on the behalf of Lelon Huh, MD,as directed by  Lelon Huh, MD while in the presence of Lelon Huh, MD.  Subjective    HPI Patient reports he started taking Rybelsus on May 1st. He has been nauseated  every day since starting the new medication. The nausea sometimes lingers for hours even if taken on a empty stomach with sips of water. Patient stopped taking medication yesterday, and the nausea has improved.    He also reports a few episodes of dyspnea with exertions and some vague chest pains. Episodes usually improve with rest. He states he had stress test done many years ago by Dr. Humphrey Rolls and was on  Some type of cardiac medication but doesn't recall exactly what.     Medications: Outpatient Medications Prior to Visit  Medication Sig  . aspirin 81 MG chewable tablet 81 mg once daily.  Marland Kitchen glipiZIDE (GLUCOTROL XL) 10 MG 24 hr tablet TAKE 1 TABLET BY MOUTH ONCE DAILY.  Marland Kitchen glucose blood  (ONETOUCH ULTRA) test strip Use to check sugar daily for type 2 diabetes E11.9  . HYDROmorphone (DILAUDID) 4 MG tablet Take 4 mg by mouth every 6 (six) hours as needed (for break through pain).   Marland Kitchen lisinopril (ZESTRIL) 10 MG tablet TAKE 1 TABLET BY MOUTH ONCE DAILY.  . meloxicam (MOBIC) 7.5 MG tablet Take 7.5 mg by mouth 2 (two) times daily.  . metFORMIN (GLUCOPHAGE) 1000 MG tablet TAKE 1 TABLET BY MOUTH TWICE DAILY  . NARCAN 4 MG/0.1ML LIQD nasal spray kit   . OXYCONTIN 30 MG 12 hr tablet Take 30 mg by mouth 4 (four) times daily as needed.   . pioglitazone (ACTOS) 45 MG tablet TAKE 1 TABLET BY MOUTH ONCE DAILY.  Marland Kitchen pregabalin (LYRICA) 100 MG capsule Take 100 mg by mouth 3 (three) times daily.   . simvastatin (ZOCOR) 40 MG tablet TAKE 1 TABLET BY MOUTH ONCE DAILY.  Marland Kitchen testosterone cypionate (DEPOTESTOSTERONE CYPIONATE) 200 MG/ML injection INJCET 2ML I.M. ONCE EVERY 14 DAYS.  Marland Kitchen zolpidem (AMBIEN CR) 12.5 MG CR tablet AMBIEN CR, 12.5MG (Oral Tablet Extended Release)  1 PO QHS for 0 days  Quantity: 30.00;  Refills: 0   Ordered :15-Jul-2010  Reginia Forts MD;  Started 03-Mar-2007 Active  . Semaglutide (RYBELSUS) 3 MG TABS Take 3 mg by mouth daily. (Patient not taking: Reported on 10/31/2019)   No facility-administered medications prior to visit.    Review of Systems    Objective    There were no vitals taken for this visit.   Awake, alert, oriented x  3. In no apparent distress   Assessment & Plan     1. Nausea Secondary to oral GLP-1 agonist. He has been taking in the morning on an empty stomach but stopped medication due to nausea. Advised the he could take it later in the day with food just to help develop a tolerance to the medication as I think he would greatly benefit from the medication.  If tolerating will continue the lowest dose for an additional month before titrating up.   2. Type 2 diabetes mellitus without complication, without long-term current use of insulin (HCC)   3.  Chest pain, unspecified type   4. Dyspnea on exertion It looks like his last stress test was around 2006 and he may have had a cath in 2008 when he was Dr. Sharyon Medicus patient, but i'm unable to access reports. He does have multiple cardiac risk factors and will refer to cardiology for further evaluation   No follow-ups on file.    I discussed the assessment and treatment plan with the patient. The patient was provided an opportunity to ask questions and all were answered. The patient agreed with the plan and demonstrated an understanding of the instructions.   The patient was advised to call back or seek an in-person evaluation if the symptoms worsen or if the condition fails to improve as anticipated.  I provided 12 minutes of non-face-to-face time during this encounter.  The entirety of the information documented in the History of Present Illness, Review of Systems and Physical Exam were personally obtained by me. Portions of this information were initially documented by the CMA and reviewed by me for thoroughness and accuracy.     Lelon Huh, MD [facility] [phone] (phone) [phone] (fax)  [place]

## 2019-10-30 NOTE — Telephone Encounter (Signed)
Requested Prescriptions  Pending Prescriptions Disp Refills  . simvastatin (ZOCOR) 40 MG tablet [Pharmacy Med Name: SIMVASTATIN 40 MG TABLET] 90 tablet 4    Sig: TAKE 1 TABLET BY MOUTH ONCE DAILY.     Cardiovascular:  Antilipid - Statins Failed - 10/30/2019 12:40 PM      Failed - LDL in normal range and within 360 days    LDL Chol Calc (NIH)  Date Value Ref Range Status  09/25/2019 75 0 - 99 mg/dL Final         Passed - Total Cholesterol in normal range and within 360 days    Cholesterol, Total  Date Value Ref Range Status  09/25/2019 140 100 - 199 mg/dL Final         Passed - HDL in normal range and within 360 days    HDL  Date Value Ref Range Status  09/25/2019 46 >39 mg/dL Final         Passed - Triglycerides in normal range and within 360 days    Triglycerides  Date Value Ref Range Status  09/25/2019 106 0 - 149 mg/dL Final         Passed - Patient is not pregnant      Passed - Valid encounter within last 12 months    Recent Outpatient Visits          1 month ago Annual physical exam   [facility] Malva Limes, MD   7 months ago Type 2 diabetes mellitus without complication, without long-term current use of insulin [facility])   [facility] Malva Limes, MD   11 months ago Uncontrolled type 2 diabetes mellitus with hyperglycemia [facility])   [facility] Malva Limes, MD   1 year ago Uncontrolled type 2 diabetes mellitus with hyperglycemia [facility])   [facility] Malva Limes, MD   1 year ago Uncontrolled type 2 diabetes mellitus with hyperglycemia [facility])   [facility] Malva Limes, MD      Future Appointments            In 2 months Fisher, Demetrios Isaacs, MD [facility], [facility]

## 2019-10-31 ENCOUNTER — Telehealth (INDEPENDENT_AMBULATORY_CARE_PROVIDER_SITE_OTHER): Payer: Medicare Other | Admitting: Family Medicine

## 2019-10-31 ENCOUNTER — Encounter: Payer: Self-pay | Admitting: Family Medicine

## 2019-10-31 DIAGNOSIS — R079 Chest pain, unspecified: Secondary | ICD-10-CM | POA: Diagnosis not present

## 2019-10-31 DIAGNOSIS — R0609 Other forms of dyspnea: Secondary | ICD-10-CM

## 2019-10-31 DIAGNOSIS — E119 Type 2 diabetes mellitus without complications: Secondary | ICD-10-CM | POA: Diagnosis not present

## 2019-10-31 DIAGNOSIS — R0600 Dyspnea, unspecified: Secondary | ICD-10-CM

## 2019-10-31 DIAGNOSIS — R110 Nausea: Secondary | ICD-10-CM

## 2019-11-02 ENCOUNTER — Ambulatory Visit: Payer: Medicare Other | Admitting: Cardiology

## 2019-11-02 ENCOUNTER — Other Ambulatory Visit: Payer: Self-pay

## 2019-11-02 ENCOUNTER — Encounter: Payer: Self-pay | Admitting: Cardiology

## 2019-11-02 VITALS — BP 160/100 | HR 133 | Ht 72.0 in | Wt 369.2 lb

## 2019-11-02 DIAGNOSIS — R400 Somnolence: Secondary | ICD-10-CM

## 2019-11-02 DIAGNOSIS — I4891 Unspecified atrial fibrillation: Secondary | ICD-10-CM | POA: Diagnosis not present

## 2019-11-02 DIAGNOSIS — R0600 Dyspnea, unspecified: Secondary | ICD-10-CM | POA: Diagnosis not present

## 2019-11-02 DIAGNOSIS — I10 Essential (primary) hypertension: Secondary | ICD-10-CM

## 2019-11-02 MED ORDER — METOPROLOL TARTRATE 25 MG PO TABS
25.0000 mg | ORAL_TABLET | Freq: Two times a day (BID) | ORAL | 3 refills | Status: DC
Start: 2019-11-02 — End: 2019-12-15

## 2019-11-02 MED ORDER — APIXABAN 5 MG PO TABS: 5.0000 mg | ORAL_TABLET | Freq: Two times a day (BID) | ORAL | 3 refills | Status: DC

## 2019-11-02 MED ORDER — LOSARTAN POTASSIUM 25 MG PO TABS
25.0000 mg | ORAL_TABLET | Freq: Two times a day (BID) | ORAL | 3 refills | Status: DC
Start: 2019-11-02 — End: 2019-11-02

## 2019-11-02 MED ORDER — APIXABAN 5 MG PO TABS
5.0000 mg | ORAL_TABLET | Freq: Two times a day (BID) | ORAL | 3 refills | Status: DC
Start: 2019-11-02 — End: 2019-11-02

## 2019-11-02 MED ORDER — METOPROLOL TARTRATE 25 MG PO TABS
25.0000 mg | ORAL_TABLET | Freq: Two times a day (BID) | ORAL | 3 refills | Status: DC
Start: 2019-11-02 — End: 2019-11-02

## 2019-11-02 NOTE — Patient Instructions (Addendum)
Medication Instructions:  Your physician has recommended you make the following change in your medication:  1. STOP ASA 2. START LOPRESSOR  3. START ELIQUIS  *If you need a refill on your cardiac medications before your next appointment, please call your pharmacy*    Lab Work: None Ordered If you have labs (blood work) drawn today and your tests are completely normal, you will receive your results only by: Marland Kitchen MyChart Message (if you have MyChart) OR . A paper copy in the mail If you have any lab test that is abnormal or we need to change your treatment, we will call you to review the results.   Testing/Procedures:  Your physician has requested that you have an echocardiogram. Echocardiography is a painless test that uses sound waves to create images of your heart. It provides your doctor with information about the size and shape of your heart and how well your heart's chambers and valves are working. This procedure takes approximately one hour. There are no restrictions for this procedure.    Follow-Up: At [facility], you and your health needs are our priority.  As part of our continuing mission to provide you with exceptional heart care, we have created designated Provider Care Teams.  These Care Teams include your primary Cardiologist (physician) and Advanced Practice Providers (APPs -  Physician Assistants and Nurse Practitioners) who all work together to provide you with the care you need, when you need it.  We recommend signing up for the patient portal called "MyChart".  Sign up information is provided on this After Visit Summary.  MyChart is used to connect with patients for Virtual Visits (Telemedicine).  Patients are able to view lab/test results, encounter notes, upcoming appointments, etc.  Non-urgent messages can be sent to your provider as well.   To learn more about what you can do with MyChart, go to [email or web address].    Your next appointment:   Follow up  after echo and sleep study / 6-8 weeks  The format for your next appointment:   In Person  Provider:   Debbe Odea, MD   Other Instructions   Echocardiogram An echocardiogram is a procedure that uses painless sound waves (ultrasound) to produce an image of the heart. Images from an echocardiogram can provide important information about:  Signs of coronary artery disease (CAD).  Aneurysm detection. An aneurysm is a weak or damaged part of an artery wall that bulges out from the normal force of blood pumping through the body.  Heart size and shape. Changes in the size or shape of the heart can be associated with certain conditions, including heart failure, aneurysm, and CAD.  Heart muscle function.  Heart valve function.  Signs of a past heart attack.  Fluid buildup around the heart.  Thickening of the heart muscle.  A tumor or infectious growth around the heart valves. Tell a health care provider about:  Any allergies you have.  All medicines you are taking, including vitamins, herbs, eye drops, creams, and over-the-counter medicines.  Any blood disorders you have.  Any surgeries you have had.  Any medical conditions you have.  Whether you are pregnant or may be pregnant. What are the risks? Generally, this is a safe procedure. However, problems may occur, including:  Allergic reaction to dye (contrast) that may be used during the procedure. What happens before the procedure? No specific preparation is needed. You may eat and drink normally. What happens during the procedure?   An IV tube may be  inserted into one of your veins.  You may receive contrast through this tube. A contrast is an injection that improves the quality of the pictures from your heart.  A gel will be applied to your chest.  A wand-like tool (transducer) will be moved over your chest. The gel will help to transmit the sound waves from the transducer.  The sound waves will  harmlessly bounce off of your heart to allow the heart images to be captured in real-time motion. The images will be recorded on a computer. The procedure may vary among health care providers and hospitals. What happens after the procedure?  You may return to your normal, everyday life, including diet, activities, and medicines, unless your health care provider tells you not to do that. Summary  An echocardiogram is a procedure that uses painless sound waves (ultrasound) to produce an image of the heart.  Images from an echocardiogram can provide important information about the size and shape of your heart, heart muscle function, heart valve function, and fluid buildup around your heart.  You do not need to do anything to prepare before this procedure. You may eat and drink normally.  After the echocardiogram is completed, you may return to your normal, everyday life, unless your health care provider tells you not to do that. This information is not intended to replace advice given to you by your health care provider. Make sure you discuss any questions you have with your health care provider. Document Revised: 09/15/2018 Document Reviewed: 06/27/2016 Elsevier Patient Education  [place].

## 2019-11-02 NOTE — Progress Notes (Signed)
Cardiology Office Note:    Date:  11/02/2019   ID:  Taylor [name], DOB 1955-10-16, MRN [id number]  PCP:  Birdie Sons, MD  Cardiologist:  Kate Sable, MD  Electrophysiologist:  None   Referring MD: Birdie Sons, MD   Chief Complaint  Patient presents with  . New Patient (Initial Visit)    Chest pain/DOE; Meds verbally reviewed with patient.    History of Present Illness:    Taylor [name] is a 64 y.o. male with a hx of hypertension, diabetes who presents due to chest pain and shortness of breath.  Patient states having symptoms of fatigue, shortness of breath for the past 6 months which have worsened over the past week.  He endorses snoring, daytime somnolence, daytime fatigue even after getting sleep.  He states sleeping with 3 pillows due to having back issues and helps relieve the pressure of his back.  He has lots of back issues, sees a pain specialist due to back pain.  His back pain typically causes spikes in his blood pressure.  Denies edema.  Also notes some shortness of breath and fatigue when he overexerts himself.  Laying flat on his back causes difficulty breathing sometimes.  He denies chest pains, palpitations.  Past Medical History:  Diagnosis Date  . Arthritis   . Broken leg 02/07/2019  . Depression   . Diabetes mellitus without complication ([place])   . Hypertension     Past Surgical History:  Procedure Laterality Date  . BACK SURGERY      Current Medications: Current Meds  Medication Sig  . glipiZIDE (GLUCOTROL XL) 10 MG 24 hr tablet TAKE 1 TABLET BY MOUTH ONCE DAILY.  Marland Kitchen glucose blood (ONETOUCH ULTRA) test strip Use to check sugar daily for type 2 diabetes E11.9  . HYDROmorphone (DILAUDID) 4 MG tablet Take 4 mg by mouth every 6 (six) hours as needed (for break through pain).   Marland Kitchen lisinopril (ZESTRIL) 10 MG tablet TAKE 1 TABLET BY MOUTH ONCE DAILY.  . meloxicam (MOBIC) 7.5 MG tablet Take 7.5 mg by mouth 2 (two) times daily.  . metFORMIN  (GLUCOPHAGE) 1000 MG tablet TAKE 1 TABLET BY MOUTH TWICE DAILY  . NARCAN 4 MG/0.1ML LIQD nasal spray kit   . OXYCONTIN 30 MG 12 hr tablet Take 30 mg by mouth 4 (four) times daily as needed.   . pioglitazone (ACTOS) 45 MG tablet TAKE 1 TABLET BY MOUTH ONCE DAILY.  Marland Kitchen pregabalin (LYRICA) 100 MG capsule Take 100 mg by mouth 3 (three) times daily.   . simvastatin (ZOCOR) 40 MG tablet TAKE 1 TABLET BY MOUTH ONCE DAILY.  Marland Kitchen testosterone cypionate (DEPOTESTOSTERONE CYPIONATE) 200 MG/ML injection INJCET 2ML I.M. ONCE EVERY 14 DAYS.  Marland Kitchen zolpidem (AMBIEN CR) 12.5 MG CR tablet AMBIEN CR, 12.5MG (Oral Tablet Extended Release)  1 PO QHS for 0 days  Quantity: 30.00;  Refills: 0   Ordered :15-Jul-2010  Reginia Forts MD;  Started 03-Mar-2007 Active  . [DISCONTINUED] aspirin 81 MG chewable tablet 81 mg once daily.     Allergies:   Other   Social History   Socioeconomic History  . Marital status: Single    Spouse name: Not on file  . Number of children: 1  . Years of education: Not on file  . Highest education level: Some college, no degree  Occupational History  . Occupation: disability/retired  Tobacco Use  . Smoking status: Never Smoker  . Smokeless tobacco: Never Used  Substance and Sexual Activity  .  Alcohol use: No    Alcohol/week: 0.0 standard drinks  . Drug use: No  . Sexual activity: Not on file  Other Topics Concern  . Not on file  Social History Narrative  . Not on file   Social Determinants of Health   Financial Resource Strain:   . Difficulty of Paying Living Expenses:   Food Insecurity:   . Worried About Charity fundraiser in the Last Year:   . Arboriculturist in the Last Year:   Transportation Needs:   . Film/video editor (Medical):   Marland Kitchen Lack of Transportation (Non-Medical):   Physical Activity:   . Days of Exercise per Week:   . Minutes of Exercise per Session:   Stress:   . Feeling of Stress :   Social Connections:   . Frequency of Communication with Friends  and Family:   . Frequency of Social Gatherings with Friends and Family:   . Attends Religious Services:   . Active Member of Clubs or Organizations:   . Attends Archivist Meetings:   Marland Kitchen Marital Status:      Family History: The patient's family history includes Diabetes in his mother and sister.  ROS:   Please see the history of present illness.     All other systems reviewed and are negative.  EKGs/Labs/Other Studies Reviewed:    The following studies were reviewed today:   EKG:  EKG is  ordered today.  The ekg ordered today demonstrates atrial fibrillation  Recent Labs: 09/25/2019: ALT 13; BUN 12; Creatinine, Ser 0.92; Hemoglobin 12.5; Platelets 190; Potassium 4.4; Sodium 140  Recent Lipid Panel    Component Value Date/Time   CHOL 140 09/25/2019 1509   TRIG 106 09/25/2019 1509   HDL 46 09/25/2019 1509   CHOLHDL 3.0 09/25/2019 1509   LDLCALC 75 09/25/2019 1509    Physical Exam:    VS:  BP (!) 160/100 (BP Location: Right Arm, Patient Position: Sitting, Cuff Size: Large)   Pulse (!) 133   Ht 6' (1.829 m)   Wt (!) 369 lb 4 oz (167.5 kg)   SpO2 93%   BMI 50.08 kg/m     Wt Readings from Last 3 Encounters:  11/02/19 (!) 369 lb 4 oz (167.5 kg)  09/25/19 (!) 359 lb (162.8 kg)  03/13/19 (!) 347 lb (157.4 kg)     GEN:  Well nourished, well developed in no acute distress HEENT: Normal NECK: No JVD; No carotid bruits LYMPHATICS: No lymphadenopathy CARDIAC: Irregular irregular, tachycardic, no murmurs, rubs, gallops RESPIRATORY:  Clear to auscultation without rales, wheezing or rhonchi  ABDOMEN: Soft, non-tender, non-distended MUSCULOSKELETAL:  No edema; No deformity  SKIN: Warm and dry NEUROLOGIC:  Alert and oriented x 3 PSYCHIATRIC:  Normal affect   ASSESSMENT:    1. Atrial fibrillation, unspecified type ([place])   2. Dyspnea, unspecified type   3. Daytime somnolence   4. Essential hypertension    PLAN:    In order of problems listed  above:  1. Patient with new onset atrial fibrillation rapid ventricular response noted on EKG.  CHA2DS2-VASc score at least 2 (htn. DM).  Start Lopressor 25 mg twice daily.  Start Eliquis 5 mg twice daily.  Get echocardiogram. 2. Patient with shortness of breath sometimes with exertion and other times at rest.  We will get an echocardiogram to evaluate for systolic or diastolic dysfunction. 3. Patient with daytime somnolence, snoring, fatigue, morbidly obese.  This puts him very likely to have obstructive  sleep apnea.  Will refer patient to pulmonary medicine for sleep study and possible management. 4. Patient with elevated blood pressure.  His chronic pain could be contributing.  Continue lisinopril 10 mg, start Lopressor 25 mg twice daily as above.  If blood pressure elevated at follow-up visit, will plan to titrate BP meds.  Follow-up after echocardiogram.  Total encounter time 65  minutes  Greater than 50% was spent in counseling and coordination of care with the patient   This note was generated in part or whole with voice recognition software. Voice recognition is usually quite accurate but there are transcription errors that can and very often do occur. I apologize for any typographical errors that were not detected and corrected.  Medication Adjustments/Labs and Tests Ordered: Current medicines are reviewed at length with the patient today.  Concerns regarding medicines are outlined above.  Orders Placed This Encounter  Procedures  . Ambulatory referral to Pulmonology  . EKG 12-Lead  . ECHOCARDIOGRAM COMPLETE   No orders of the defined types were placed in this encounter.   Patient Instructions  Medication Instructions:  Your physician has recommended you make the following change in your medication:  1. STOP ASA 2. START LOPRESSOR  3. START ELIQUIS  *If you need a refill on your cardiac medications before your next appointment, please call your pharmacy*    Lab  Work: None Ordered If you have labs (blood work) drawn today and your tests are completely normal, you will receive your results only by: Marland Kitchen MyChart Message (if you have MyChart) OR . A paper copy in the mail If you have any lab test that is abnormal or we need to change your treatment, we will call you to review the results.   Testing/Procedures:  Your physician has requested that you have an echocardiogram. Echocardiography is a painless test that uses sound waves to create images of your heart. It provides your doctor with information about the size and shape of your heart and how well your heart's chambers and valves are working. This procedure takes approximately one hour. There are no restrictions for this procedure.    Follow-Up: At [facility], you and your health needs are our priority.  As part of our continuing mission to provide you with exceptional heart care, we have created designated Provider Care Teams.  These Care Teams include your primary Cardiologist (physician) and Advanced Practice Providers (APPs -  Physician Assistants and Nurse Practitioners) who all work together to provide you with the care you need, when you need it.  We recommend signing up for the patient portal called "MyChart".  Sign up information is provided on this After Visit Summary.  MyChart is used to connect with patients for Virtual Visits (Telemedicine).  Patients are able to view lab/test results, encounter notes, upcoming appointments, etc.  Non-urgent messages can be sent to your provider as well.   To learn more about what you can do with MyChart, go to [email or web address].    Your next appointment:   Follow up after echo and sleep study / 6-8 weeks  The format for your next appointment:   In Person  Provider:   Kate Sable, MD   Other Instructions   Echocardiogram An echocardiogram is a procedure that uses painless sound waves (ultrasound) to produce an image of the heart.  Images from an echocardiogram can provide important information about:  Signs of coronary artery disease (CAD).  Aneurysm detection. An aneurysm is a weak or damaged part of  an artery wall that bulges out from the normal force of blood pumping through the body.  Heart size and shape. Changes in the size or shape of the heart can be associated with certain conditions, including heart failure, aneurysm, and CAD.  Heart muscle function.  Heart valve function.  Signs of a past heart attack.  Fluid buildup around the heart.  Thickening of the heart muscle.  A tumor or infectious growth around the heart valves. Tell a health care provider about:  Any allergies you have.  All medicines you are taking, including vitamins, herbs, eye drops, creams, and over-the-counter medicines.  Any blood disorders you have.  Any surgeries you have had.  Any medical conditions you have.  Whether you are pregnant or may be pregnant. What are the risks? Generally, this is a safe procedure. However, problems may occur, including:  Allergic reaction to dye (contrast) that may be used during the procedure. What happens before the procedure? No specific preparation is needed. You may eat and drink normally. What happens during the procedure?   An IV tube may be inserted into one of your veins.  You may receive contrast through this tube. A contrast is an injection that improves the quality of the pictures from your heart.  A gel will be applied to your chest.  A wand-like tool (transducer) will be moved over your chest. The gel will help to transmit the sound waves from the transducer.  The sound waves will harmlessly bounce off of your heart to allow the heart images to be captured in real-time motion. The images will be recorded on a computer. The procedure may vary among health care providers and hospitals. What happens after the procedure?  You may return to your normal, everyday life,  including diet, activities, and medicines, unless your health care provider tells you not to do that. Summary  An echocardiogram is a procedure that uses painless sound waves (ultrasound) to produce an image of the heart.  Images from an echocardiogram can provide important information about the size and shape of your heart, heart muscle function, heart valve function, and fluid buildup around your heart.  You do not need to do anything to prepare before this procedure. You may eat and drink normally.  After the echocardiogram is completed, you may return to your normal, everyday life, unless your health care provider tells you not to do that. This information is not intended to replace advice given to you by your health care provider. Make sure you discuss any questions you have with your health care provider. Document Revised: 09/15/2018 Document Reviewed: 06/27/2016 Elsevier Patient Education  2020 [place], Kate Sable, MD  11/02/2019 2:08 PM    [facility]

## 2019-11-07 ENCOUNTER — Encounter: Payer: Self-pay | Admitting: Family Medicine

## 2019-11-07 ENCOUNTER — Ambulatory Visit (INDEPENDENT_AMBULATORY_CARE_PROVIDER_SITE_OTHER): Payer: Medicare Other | Admitting: Family Medicine

## 2019-11-07 ENCOUNTER — Ambulatory Visit: Payer: Self-pay | Admitting: *Deleted

## 2019-11-07 ENCOUNTER — Other Ambulatory Visit: Payer: Self-pay

## 2019-11-07 VITALS — BP 145/89 | HR 82 | Temp 97.3°F | Resp 24 | Wt 364.0 lb

## 2019-11-07 DIAGNOSIS — I4819 Other persistent atrial fibrillation: Secondary | ICD-10-CM

## 2019-11-07 DIAGNOSIS — E1165 Type 2 diabetes mellitus with hyperglycemia: Secondary | ICD-10-CM

## 2019-11-07 DIAGNOSIS — R0600 Dyspnea, unspecified: Secondary | ICD-10-CM

## 2019-11-07 DIAGNOSIS — R609 Edema, unspecified: Secondary | ICD-10-CM

## 2019-11-07 MED ORDER — DAPAGLIFLOZIN PROPANEDIOL 5 MG PO TABS: 5.0000 mg | ORAL_TABLET | Freq: Every day | ORAL | Status: DC

## 2019-11-07 MED ORDER — FUROSEMIDE 20 MG PO TABS: 20.0000 mg | ORAL_TABLET | Freq: Every day | ORAL | 3 refills | Status: DC

## 2019-11-07 NOTE — Telephone Encounter (Signed)
Pt called with complaints of difficulty breathing that is ongoing SOB since appt on 11/02/19; he states he talked to Dr Sherrie Mustache about the medication he was prescribed by Cardiology; the pt also states he has foot and ankle swelling for the past week; it is hard to wear his boots; he would also like to discuss the medication prescribed per Cardiology before he starts taking it; recommendations made per nurse triage protocol; the pt sees Dr Sherrie Mustache, [facility]; decision tree completed; pt offered appt with Dr Sherrie Mustache, 11/08/19 at 1120, but declined; he would like to be seen today; attempted to contact [facility] x 1 without success; pt notified; he would like a call back, and can be contacted at [phone]; will route to office for final disposition.  Reason for Disposition  [1] MODERATE leg swelling (e.g., swelling extends up to knees) AND [2] new onset or worsening  Answer Assessment - Initial Assessment Questions 1. ONSET: "When did the swelling start?" (e.g., minutes, hours, days)     10/13/19 2. LOCATION: "What part of the leg is swollen?"  "Are both legs swollen or just one leg?"     Both legs knee to feet; ankle and feet worse 3. SEVERITY: "How bad is the swelling?" (e.g., localized; mild, moderate, severe)  - Localized - small area of swelling localized to one leg  - MILD pedal edema - swelling limited to foot and ankle, pitting edema < 1/4 inch (6 mm) deep, rest and elevation eliminate most or all swelling  - MODERATE edema - swelling of lower leg to knee, pitting edema > 1/4 inch (6 mm) deep, rest and elevation only partially reduce swelling  - SEVERE edema - swelling extends above knee, facial or hand swelling present    moderate 4. REDNESS: "Does the swelling look red or infected?"    no 5. PAIN: "Is the swelling painful to touch?" If so, ask: "How painful is it?"   (Scale 1-10; mild, moderate or severe)     no 6. FEVER: "Do you have a fever?" If so, ask: "What is it, how was it measured, and  when did it start?"      no 7. CAUSE: "What do you think is causing the leg swelling?"     Not sure 8. MEDICAL HISTORY: "Do you have a history of heart failure, kidney disease, liver failure, or cancer?"     afib 9. RECURRENT SYMPTOM: "Have you had leg swelling before?" If so, ask: "When was the last time?" "What happened that time?"     no 10. OTHER SYMPTOMS: "Do you have any other symptoms?" (e.g., chest pain, difficulty breathing)       Ongoing SOB with activity 11. PREGNANCY: "Is there any chance you are pregnant?" "When was your last menstrual period?"       n/a  Protocols used: LEG SWELLING AND EDEMA-A-AH

## 2019-11-07 NOTE — Progress Notes (Signed)
Established patient visit   Patient: Taylor [name]   DOB: 01/30/1956   64 y.o. Male  MRN: [id number] Visit Date: 11/07/2019  Today's healthcare provider: Lelon Huh, MD   Chief Complaint  Patient presents with  . Shortness of Breath   Subjective    Shortness of Breath This is a new problem. The current episode started 1 to 4 weeks ago. The problem occurs intermittently. The problem has been gradually worsening (worsened 1 week ago). Associated symptoms include leg swelling (in both ankles). Pertinent negatives include no abdominal pain, chest pain, fever, vomiting or wheezing. The symptoms are aggravated by exercise (walking).  Patient also reports swelling of both ankles. He denies any chest pain. He was seen by Cardiology on 11/02/2019 for new onset Atrial Fibrillation. He was started on Lopressor and Eliquis. Patient was has not yet started Eliquis due to concerns about potential side effects.   Of note is that he had been SGLT-1 inhibitors off and on for diabetes for several years but stopped due to cost of medications. We tried changing to Rybelsus which he has stopped due to severe nausea from medication. He did not have any adverse effects to SGLT-1 inhibitors.      Wt Readings from Last 5 Encounters:  11/07/19 (!) 364 lb (165.1 kg)  11/02/19 (!) 369 lb 4 oz (167.5 kg)  09/25/19 (!) 359 lb (162.8 kg)  03/13/19 (!) 347 lb (157.4 kg)  11/07/18 (!) 330 lb (149.7 kg)      Medications: Outpatient Medications Prior to Visit  Medication Sig  . apixaban (ELIQUIS) 5 MG TABS tablet Take 1 tablet (5 mg total) by mouth 2 (two) times daily.  Marland Kitchen glipiZIDE (GLUCOTROL XL) 10 MG 24 hr tablet TAKE 1 TABLET BY MOUTH ONCE DAILY.  Marland Kitchen glucose blood (ONETOUCH ULTRA) test strip Use to check sugar daily for type 2 diabetes E11.9  . HYDROmorphone (DILAUDID) 4 MG tablet Take 4 mg by mouth every 6 (six) hours as needed (for break through pain).   Marland Kitchen lisinopril (ZESTRIL) 10 MG tablet TAKE  1 TABLET BY MOUTH ONCE DAILY.  . meloxicam (MOBIC) 7.5 MG tablet Take 7.5 mg by mouth 2 (two) times daily.  . metFORMIN (GLUCOPHAGE) 1000 MG tablet TAKE 1 TABLET BY MOUTH TWICE DAILY  . metoprolol tartrate (LOPRESSOR) 25 MG tablet Take 1 tablet (25 mg total) by mouth 2 (two) times daily.  Marland Kitchen NARCAN 4 MG/0.1ML LIQD nasal spray kit   . OXYCONTIN 30 MG 12 hr tablet Take 30 mg by mouth 4 (four) times daily as needed.   . pioglitazone (ACTOS) 45 MG tablet TAKE 1 TABLET BY MOUTH ONCE DAILY.  Marland Kitchen pregabalin (LYRICA) 100 MG capsule Take 100 mg by mouth 3 (three) times daily.   . Semaglutide (RYBELSUS) 3 MG TABS Take 3 mg by mouth daily. (Patient not taking: Reported on 11/02/2019)  . simvastatin (ZOCOR) 40 MG tablet TAKE 1 TABLET BY MOUTH ONCE DAILY.  Marland Kitchen testosterone cypionate (DEPOTESTOSTERONE CYPIONATE) 200 MG/ML injection INJCET 2ML I.M. ONCE EVERY 14 DAYS.  Marland Kitchen zolpidem (AMBIEN CR) 12.5 MG CR tablet AMBIEN CR, 12.5MG (Oral Tablet Extended Release)  1 PO QHS for 0 days  Quantity: 30.00;  Refills: 0   Ordered :15-Jul-2010  Reginia Forts MD;  Started 03-Mar-2007 Active   No facility-administered medications prior to visit.    Review of Systems  Constitutional: Negative for appetite change, chills and fever.  Respiratory: Positive for shortness of breath. Negative for chest tightness and wheezing.  Cardiovascular: Positive for leg swelling (in both ankles). Negative for chest pain and palpitations.  Gastrointestinal: Negative for abdominal pain, nausea and vomiting.     Objective    BP (!) 145/89 (BP Location: Right Arm, Patient Position: Sitting, Cuff Size: Large)   Pulse 82   Temp (!) 97.3 F (36.3 C) (Temporal)   Resp (!) 24   Wt (!) 364 lb (165.1 kg)   BMI 49.37 kg/m   Physical Exam   General: Appearance:    Obese male in no acute distress  Eyes:    PERRL, conjunctiva/corneas clear, EOM's intact       Lungs:     Clear to auscultation bilaterally, respirations unlabored  Heart:     Normal heart rate. IRIR. No murmurs, rubs, or gallops.   MS:   All extremities are intact. 3+ bilateral lower extremity edema.   Neurologic:   Awake, alert, oriented x 3. No apparent focal neurological           defect.         No results found for any visits on 11/07/19.  Assessment & Plan     1. Edema, unspecified type start- furosemide (LASIX) 20 MG tablet; Take 1 tablet (20 mg total) by mouth daily.  Dispense: 30 tablet; Refill: 3  2. Persistent atrial fibrillation ([place]) Recent onset, scheduled for echo next week per cardiology  3. Dyspnea, unspecified type Expect some improvement with diuresis, likely component of CHF  4. Uncontrolled type 2 diabetes mellitus with hyperglycemia ([place]) Did not tolerate oral semaglutide and not interested in injectable due to severe anxiety from shots. He did well with SGLT-2 inhibitors in the past and only stopped due to cost. Given 3 weeks samples Farxiga 26m and consider increase back up to   - dapagliflozin propanediol (FARXIGA) 5 MG TABS tablet; Take 1 tablet (5 mg total) by mouth daily before breakfast.  Dispense: 21 tablet  Future Appointments  Date Time Provider DOld Agency 12/12/2019 10:30 AM [facility]  12/22/2019 11:00 AM WMartyn Ehrich NP [facility] None  12/28/2019 10:00 AM AKate Sable MD [facility]  01/23/2020 10:20 AM FBirdie Sons MD [facility]          The entirety of the information documented in the History of Present Illness, Review of Systems and Physical Exam were personally obtained by me. Portions of this information were initially documented by the CMA and reviewed by me for thoroughness and accuracy.      DLelon Huh MD  [phone](phone) [phone](fax)  [place]

## 2019-11-07 NOTE — Telephone Encounter (Signed)
Patient is requesting an appointment to be seen today with Dr. Sherrie Mustache for evaluation of SOB, recheck of foot and ankle swelling, and to discuss medications prescribed by Cardiology. There are no openings on you schedule. Please advise if patient can be worked in, or whether he should schedule appt with another provider.

## 2019-11-07 NOTE — Telephone Encounter (Signed)
He can have the 1 oclock slot. If he has any chest pains he needs to go to the ER

## 2019-11-07 NOTE — Patient Instructions (Signed)
.   You can take the first dose of furosemide then take once every morning   Start samples of Farxiga 5mg  tomorrow morning

## 2019-11-09 ENCOUNTER — Ambulatory Visit: Payer: Self-pay | Admitting: Cardiology

## 2019-11-10 ENCOUNTER — Telehealth: Payer: Medicare Other | Admitting: Family Medicine

## 2019-11-22 ENCOUNTER — Telehealth: Payer: Self-pay

## 2019-11-22 NOTE — Telephone Encounter (Signed)
I've got more samples, he can pick them on Friday

## 2019-11-22 NOTE — Telephone Encounter (Signed)
Copied from CRM [id number]. Topic: General - Other >> Nov 22, 2019  3:42 PM Mcneil, Ja-Kwan wrote: Reason for CRM: Pt stated he normally gets samples of the dapagliflozin propanediol (FARXIGA) 5 MG TABS tablet. Pt request call back to advise when he can come in for samples of the medication. Pt also stated he has questions about the medication.

## 2019-11-23 NOTE — Telephone Encounter (Signed)
Tried calling patient. Left message to call back. OK for [facility] to advise.  ?

## 2019-11-24 ENCOUNTER — Ambulatory Visit: Payer: Self-pay | Admitting: *Deleted

## 2019-11-24 DIAGNOSIS — E1165 Type 2 diabetes mellitus with hyperglycemia: Secondary | ICD-10-CM

## 2019-11-24 NOTE — Telephone Encounter (Signed)
Patient initial call back for request of  samples for farixga. Patient verbalized understanding he can come in today and pick up additional samples. Patient reports he may be able to come in on Monday, he has enough medication until Monday. Patient also reported c/o increasing swelling in scrotum. Patient reports bilateral testicle swelling > 1 week.Patient expressed concern farixga may contribute to new scrotal swelling.  Increased swelling now interfering with ambulation for patient. Reported pain is associated with swollen scrotum rubbing on legs. Patient reports good output with urination, but difficulty starting stream continues which has been noted as a previous condition discussed with MD in prior visit per patient. Patient reports swelling in feet and legs decreased. Care advise given and patient verbalized understanding. Patient notified PCP would be notified of current concerns.   Reason for Disposition  [1] Scrotum swelling AND [2] no pain  Answer Assessment - Initial Assessment Questions 1. SCROTAL SWELLING: "What does the scrotum look like?" "How swollen is it?" (mild, moderate severe; compare to other side)     severe 2. LOCATION: "Where is the swelling located?"     Bilateral testicles  3. ONSET: "When did the swelling start?"     1 week ago when started farixga 4. PATTERN: "Does it come and go, or has it been constant since it started?"     Constant and increasing  5. SCROTAL PAIN: "Is there any pain?" If so, ask: "How bad is it?"  (Scale 1-10; or mild, moderate, severe)     Yes pain to walk and put on pants mild pain when walking  6. HERNIA: "Has a doctor ever told you that you have a hernia?"     No  7. OTHER SYMPTOMS: "Do you have any other symptoms?" (e.g., fever, abdominal pain, vomiting, difficulty passing urine)     No  Protocols used: SCROTUM SWELLING-A-AH

## 2019-11-24 NOTE — Telephone Encounter (Signed)
Patient advised. He verbalized understanding. He denied testicular pain at this time.

## 2019-11-24 NOTE — Telephone Encounter (Signed)
I left samples at front desk. farxiga should not cause swelling. If he has any testicular pain he needs to go to ER. Otherwise he can double on on furosemide through the weekend.  He does not to got to Lab to check renal panel when he comes in to pick up farxiga samples.

## 2019-11-24 NOTE — Telephone Encounter (Signed)
Attempted to call patient- left message to return call to office regarding PCP response

## 2019-11-24 NOTE — Telephone Encounter (Signed)
Patient advised of samples available today. See triage encounter on 11/24/19.

## 2019-11-27 DIAGNOSIS — E1165 Type 2 diabetes mellitus with hyperglycemia: Secondary | ICD-10-CM | POA: Diagnosis not present

## 2019-11-28 ENCOUNTER — Telehealth: Payer: Self-pay

## 2019-11-28 LAB — RENAL FUNCTION PANEL
Albumin: 4 g/dL (ref 3.8–4.8)
BUN/Creatinine Ratio: 14 (ref 10–24)
BUN: 14 mg/dL (ref 8–27)
CO2: 25 mmol/L (ref 20–29)
Calcium: 9 mg/dL (ref 8.6–10.2)
Chloride: 100 mmol/L (ref 96–106)
Creatinine, Ser: 0.98 mg/dL (ref 0.76–1.27)
GFR calc Af Amer: 95 mL/min/{1.73_m2} (ref >59)
GFR calc non Af Amer: 82 mL/min/{1.73_m2} (ref >59)
Glucose: 132 mg/dL — ABNORMAL HIGH (ref 65–99)
Phosphorus: 3.7 mg/dL (ref 2.8–4.1)
Potassium: 4.5 mmol/L (ref 3.5–5.2)
Sodium: 139 mmol/L (ref 134–144)

## 2019-11-28 NOTE — Telephone Encounter (Signed)
-----   Message from Malva Limes, MD sent at 11/28/2019  7:25 AM EDT ----- Labs are very good. Continue farxiga. Which should get more samples in a couple of weeks.

## 2019-11-28 NOTE — Telephone Encounter (Signed)
Pt returned call. Message from Dr. Fisher read, pt verbalizes understanding.  

## 2019-11-28 NOTE — Telephone Encounter (Signed)
LMTCB, [facility] Triage Nurse may give patient results  

## 2019-11-30 ENCOUNTER — Ambulatory Visit (INDEPENDENT_AMBULATORY_CARE_PROVIDER_SITE_OTHER): Payer: Medicare Other | Admitting: Family Medicine

## 2019-11-30 ENCOUNTER — Other Ambulatory Visit: Payer: Self-pay

## 2019-11-30 ENCOUNTER — Encounter: Payer: Self-pay | Admitting: Family Medicine

## 2019-11-30 VITALS — BP 120/76 | HR 70 | Temp 96.0°F | Resp 16 | Wt 360.0 lb

## 2019-11-30 DIAGNOSIS — I4819 Other persistent atrial fibrillation: Secondary | ICD-10-CM

## 2019-11-30 DIAGNOSIS — I509 Heart failure, unspecified: Secondary | ICD-10-CM | POA: Diagnosis not present

## 2019-11-30 DIAGNOSIS — R609 Edema, unspecified: Secondary | ICD-10-CM

## 2019-11-30 MED ORDER — FUROSEMIDE 40 MG PO TABS: 20.0000 mg | ORAL_TABLET | Freq: Every day | ORAL | 3 refills | Status: DC

## 2019-11-30 NOTE — Progress Notes (Signed)
Established patient visit   Patient: Taylor [name]   DOB: 06/13/1955   64 y.o. Male  MRN: [id number] Visit Date: 11/30/2019  Today's healthcare provider: Vernie Murders, PA   Chief Complaint  Patient presents with   Testicle Pain   Subjective    Testicle Pain The patient's primary symptoms include scrotal swelling and testicular pain. This is a new problem. The current episode started in the past 7 days. The problem has been unchanged. The patient is experiencing no pain. Associated symptoms include nausea. Pertinent negatives include no abdominal pain, anorexia, chest pain, chills, constipation, coughing, diarrhea, discolored urine, dysuria, fever, flank pain, frequency, headaches, hematuria, hesitancy, joint pain, joint swelling, painful intercourse, rash, shortness of breath, sore throat, urgency, urinary retention or vomiting. There is swelling in both testicles. The color of the testicles is Red. The symptoms are aggravated by tactile pressure.   Past Medical History:  Diagnosis Date   Arthritis    Broken leg 02/07/2019   Depression    Diabetes mellitus without complication (HCC)    Hypertension    Social History   Tobacco Use   Smoking status: Never   Smokeless tobacco: Never  Vaping Use   Vaping Use: Never used  Substance Use Topics   Alcohol use: No    Alcohol/week: 0.0 standard drinks   Drug use: No   Family Status  Relation Name Status   Mother  Alive   Father  Other   Sister  Alive   Brother  Alive   Allergies  Allergen Reactions   Other Itching    Pain medication/ Must take brand named CAN NOT take generic   Medications: Outpatient Medications Prior to Visit  Medication Sig   apixaban (ELIQUIS) 5 MG TABS tablet Take 1 tablet (5 mg total) by mouth 2 (two) times daily. (Patient not taking: Reported on 11/07/2019)   dapagliflozin propanediol (FARXIGA) 5 MG TABS tablet Take 1 tablet (5 mg total) by mouth daily before breakfast.   furosemide  (LASIX) 20 MG tablet Take 1 tablet (20 mg total) by mouth daily.   glipiZIDE (GLUCOTROL XL) 10 MG 24 hr tablet TAKE 1 TABLET BY MOUTH ONCE DAILY.   glucose blood (ONETOUCH ULTRA) test strip Use to check sugar daily for type 2 diabetes E11.9   HYDROmorphone (DILAUDID) 4 MG tablet Take 4 mg by mouth every 6 (six) hours as needed (for break through pain).    lisinopril (ZESTRIL) 10 MG tablet TAKE 1 TABLET BY MOUTH ONCE DAILY.   meloxicam (MOBIC) 7.5 MG tablet Take 7.5 mg by mouth 2 (two) times daily.   metFORMIN (GLUCOPHAGE) 1000 MG tablet TAKE 1 TABLET BY MOUTH TWICE DAILY   metoprolol tartrate (LOPRESSOR) 25 MG tablet Take 1 tablet (25 mg total) by mouth 2 (two) times daily.   NARCAN 4 MG/0.1ML LIQD nasal spray kit    OXYCONTIN 30 MG 12 hr tablet Take 30 mg by mouth 4 (four) times daily as needed.    pioglitazone (ACTOS) 45 MG tablet TAKE 1 TABLET BY MOUTH ONCE DAILY.   pregabalin (LYRICA) 100 MG capsule Take 100 mg by mouth 3 (three) times daily.    simvastatin (ZOCOR) 40 MG tablet TAKE 1 TABLET BY MOUTH ONCE DAILY.   testosterone cypionate (DEPOTESTOSTERONE CYPIONATE) 200 MG/ML injection INJCET 2ML I.M. ONCE EVERY 14 DAYS.   zolpidem (AMBIEN CR) 12.5 MG CR tablet AMBIEN CR, 12.5MG (Oral Tablet Extended Release)  1 PO QHS for 0 days  Quantity: 30.00;  Refills:  0   Ordered :15-Jul-2010  Reginia Forts MD;  Started 03-Mar-2007 Active   No facility-administered medications prior to visit.    Review of Systems  Constitutional: Negative for chills and fever.  HENT: Negative for sore throat.   Respiratory: Negative for cough and shortness of breath.   Cardiovascular: Negative for chest pain.  Gastrointestinal: Positive for nausea. Negative for abdominal pain, anorexia, constipation, diarrhea and vomiting.  Genitourinary: Positive for scrotal swelling and testicular pain. Negative for dysuria, flank pain, frequency, hesitancy and urgency.  Musculoskeletal: Negative for joint pain.  Skin:  Negative for rash.  Neurological: Negative for headaches.      Objective    BP 120/76   Pulse 70   Temp (!) 96 F (35.6 C) (Oral)   Resp 16   Wt (!) 360 lb (163.3 kg)   SpO2 96%   BMI 48.82 kg/m    Physical Exam Constitutional:      General: He is not in acute distress.    Appearance: He is well-developed. He is obese.  HENT:     Head: Normocephalic and atraumatic.     Right Ear: Hearing normal.     Left Ear: Hearing normal.     Nose: Nose normal.  Eyes:     General: Lids are normal. No scleral icterus.       Right eye: No discharge.        Left eye: No discharge.     Conjunctiva/sclera: Conjunctivae normal.  Cardiovascular:     Rate and Rhythm: Normal rate and regular rhythm.     Heart sounds: Normal heart sounds.  Pulmonary:     Effort: Pulmonary effort is normal. No respiratory distress.     Breath sounds: Normal breath sounds.  Abdominal:     General: Bowel sounds are normal.     Palpations: Abdomen is soft.  Genitourinary:    Comments: No pain but some swelling of scrotum. Musculoskeletal:        General: Swelling present. Normal range of motion.     Comments: Pitting edema of lower legs and ankles.  Skin:    Findings: No lesion or rash.  Neurological:     Mental Status: He is alert and oriented to person, place, and time.  Psychiatric:        Speech: Speech normal.        Behavior: Behavior normal.        Thought Content: Thought content normal.      No results found for any visits on 11/30/19.  Assessment & Plan     1. Edema, unspecified type Edema in legs and into scrotum. Suspect flare of CHF. Recommend increasing Lasix to 40 mg 1 tablet daily for the next 3-4 days then recheck in the office in a week. - furosemide (LASIX) 40 MG tablet; Take 0.5 tablets (20 mg total) by mouth daily.  Dispense: 30 tablet; Refill: 3  2. Persistent atrial fibrillation (HCC) Continue Lisinopril, Metoprolol and Lasix. Follow up with cardiologist (Dr.  Garen Lah).  3. Congestive heart failure, unspecified HF chronicity, unspecified heart failure type ([place]) No significant dyspnea but edema worse. Increase Lasix and follow up with cardiologist. If significant increase in edema, dyspnea or chest pain develops, should go to the ER.   No follow-ups on file.      Andres Shad, PA, have reviewed all documentation for this visit. The documentation on 12/07/19 for the exam, diagnosis, procedures, and orders are all accurate and complete.    Vernie Murders,  [place] [phone] (phone) [phone] (fax)  [place]

## 2019-12-06 NOTE — Progress Notes (Signed)
Established patient visit   Patient: Taylor [name]   DOB: 08-Dec-1955   64 y.o. Male  MRN: [id number] Visit Date: 12/07/2019  Today's healthcare provider: Vernie Murders, PA   Chief Complaint  Patient presents with  . Edema   Subjective    HPI  Follow up for edema:  The patient was last seen for this 1 weeks ago. Changes made at last visit include starting Furosemide 56m daily.  He reports good compliance with treatment. He feels that condition is Improved. He is not having side effects.   -----------------------------------------------------------------------------------------   Patient Active Problem List   Diagnosis Date Noted  . Persistent atrial fibrillation ([place] 11/07/2019  . Abnormal MRI, lumbar spine (2016) 02/15/2018  . Lumbar facet arthropathy (Bilateral) 02/15/2018  . Lumbar facet hypertrophy (Bilateral) 02/15/2018  . Lumbar facet syndrome 02/15/2018  . Spondylosis without myelopathy or radiculopathy, lumbosacral region 02/15/2018  . Disorder of skeletal system 02/15/2018  . Pharmacologic therapy 02/15/2018  . Problems influencing health status 02/15/2018  . High risk medication use 02/15/2018  . Long term prescription opiate use 02/15/2018  . Opiate use (304 MME/day) 02/15/2018  . Osteoarthritis of facet joint of lumbar spine 02/15/2018  . Chronic musculoskeletal pain 02/15/2018  . Neurogenic pain 02/15/2018  . DDD (degenerative disc disease), cervical 02/15/2018  . DDD (degenerative disc disease), lumbar 02/15/2018  . Morbid obesity with BMI of 40.0-44.9, adult ([place] 02/15/2018  . Therapeutic opioid induced constipation 02/15/2018  . Opioid-induced sleep disorder (HR[place] 02/15/2018  . Opioid-induced sleep disorder with moderate or severe use disorder, insomnia type ([place] 02/15/2018  . Opioid-induced mood disorder with depressive symptoms ([place] 02/15/2018  . Opioid-induced sexual dysfunction ([place] 02/15/2018  . Hypotestosteronism 02/15/2018    . NSAID long-term use 02/15/2018  . Cervical foraminal stenosis (Left: C4-5) (Bilateral: C5-6) 02/15/2018  . Abnormal MRI, cervical spine (2013) 02/15/2018  . Drug-induced erectile dysfunction 02/15/2018  . First degree AV block 01/19/2018  . Fatty liver 05/07/2015  . Failed back surgical syndrome 11/15/2014  . Chronic low back pain (Bilateral)  11/15/2014  . Dyspnea on exertion 11/15/2014  . Hypogonadism, male 11/15/2014  . Cervicalgia 11/15/2014  . Nocturia 11/15/2014  . Disturbance of skin sensation 11/15/2014  . Seborrheic keratosis 11/15/2014  . Thoracic back pain 11/15/2014  . Hepatomegaly 03/27/2013  . Generalized hyperhidrosis 10/18/2009  . History of depression 03/27/2008  . Premature beats 06/04/2007  . Chronic pain syndrome 03/03/2007  . Diabetes mellitus type 2, uncontrolled ([place] 03/03/2007  . Hypercholesteremia 03/03/2007  . Hypertension, essential, benign 03/03/2007  . Morbid obesity (HFate 03/03/2007   Past Medical History:  Diagnosis Date  . Arthritis   . Broken leg 02/07/2019  . Depression   . Diabetes mellitus without complication (HW[place]   . Hypertension    Allergies  Allergen Reactions  . Other Itching    Pain medication (brand named)        Medications: Outpatient Medications Prior to Visit  Medication Sig  . apixaban (ELIQUIS) 5 MG TABS tablet Take 1 tablet (5 mg total) by mouth 2 (two) times daily.  . dapagliflozin propanediol (FARXIGA) 5 MG TABS tablet Take 1 tablet (5 mg total) by mouth daily before breakfast.  . furosemide (LASIX) 40 MG tablet Take 0.5 tablets (20 mg total) by mouth daily.  .Marland KitchenglipiZIDE (GLUCOTROL XL) 10 MG 24 hr tablet TAKE 1 TABLET BY MOUTH ONCE DAILY.  .Marland Kitchenglucose blood (ONETOUCH ULTRA) test strip Use to check sugar daily for type 2 diabetes E11.9  .  HYDROmorphone (DILAUDID) 4 MG tablet Take 4 mg by mouth every 6 (six) hours as needed (for break through pain).   Marland Kitchen lisinopril (ZESTRIL) 10 MG tablet TAKE 1 TABLET BY MOUTH ONCE  DAILY.  . meloxicam (MOBIC) 7.5 MG tablet Take 7.5 mg by mouth 2 (two) times daily.  . metFORMIN (GLUCOPHAGE) 1000 MG tablet TAKE 1 TABLET BY MOUTH TWICE DAILY  . metoprolol tartrate (LOPRESSOR) 25 MG tablet Take 1 tablet (25 mg total) by mouth 2 (two) times daily.  Marland Kitchen NARCAN 4 MG/0.1ML LIQD nasal spray kit   . OXYCONTIN 30 MG 12 hr tablet Take 30 mg by mouth 4 (four) times daily as needed.   . pioglitazone (ACTOS) 45 MG tablet TAKE 1 TABLET BY MOUTH ONCE DAILY.  Marland Kitchen pregabalin (LYRICA) 100 MG capsule Take 100 mg by mouth 3 (three) times daily.   . simvastatin (ZOCOR) 40 MG tablet TAKE 1 TABLET BY MOUTH ONCE DAILY.  Marland Kitchen testosterone cypionate (DEPOTESTOSTERONE CYPIONATE) 200 MG/ML injection INJCET 2ML I.M. ONCE EVERY 14 DAYS.  Marland Kitchen zolpidem (AMBIEN CR) 12.5 MG CR tablet AMBIEN CR, 12.5MG (Oral Tablet Extended Release)  1 PO QHS for 0 days  Quantity: 30.00;  Refills: 0   Ordered :15-Jul-2010  Reginia Forts MD;  Started 03-Mar-2007 Active   No facility-administered medications prior to visit.    Review of Systems  Constitutional: Negative for appetite change, chills and fever.  Respiratory: Negative for chest tightness, shortness of breath and wheezing.   Cardiovascular: Positive for leg swelling (improved since last office visit). Negative for chest pain and palpitations.  Gastrointestinal: Negative for abdominal pain, nausea and vomiting.      Objective    BP 128/79 (BP Location: Right Arm, Patient Position: Sitting, Cuff Size: Large)   Pulse (!) 55   Temp 97.8 F (36.6 C) (Temporal)   Resp 20   Wt (!) 357 lb (161.9 kg)   BMI 48.42 kg/m  BP Readings from Last 3 Encounters:  12/07/19 128/79  11/30/19 120/76  11/07/19 (!) 145/89   Wt Readings from Last 3 Encounters:  12/07/19 (!) 357 lb (161.9 kg)  11/30/19 (!) 360 lb (163.3 kg)  11/07/19 (!) 364 lb (165.1 kg)    Physical Exam Constitutional:      General: He is not in acute distress.    Appearance: He is well-developed.    HENT:     Head: Normocephalic and atraumatic.     Right Ear: Hearing normal.     Left Ear: Hearing normal.     Nose: Nose normal.  Eyes:     General: Lids are normal. No scleral icterus.       Right eye: No discharge.        Left eye: No discharge.     Conjunctiva/sclera: Conjunctivae normal.  Cardiovascular:     Rate and Rhythm: Normal rate and regular rhythm.     Heart sounds: Normal heart sounds.  Pulmonary:     Effort: Pulmonary effort is normal. No respiratory distress.  Musculoskeletal:        General: Normal range of motion.     Cervical back: Neck supple.     Right lower leg: Edema present.     Left lower leg: Edema present.     Comments: Edema of legs less tense.   Skin:    Findings: No lesion or rash.  Neurological:     Mental Status: He is alert and oriented to person, place, and time.  Psychiatric:  Speech: Speech normal.        Behavior: Behavior normal.        Thought Content: Thought content normal.     No results found for any visits on 12/07/19.  Assessment & Plan     1. Edema, unspecified type Breathing easier and able to lie back to sleep better since going up to 40 mg qd on the Lasix and losing 3 lbs in the past week. Check hydration and electrolyte levels with renal function. Suspect secondary to CHF with improvement in diureses. - CBC with Differential/Platelet - Comprehensive metabolic panel - furosemide (LASIX) 40 MG tablet; Take 1 tablet (40 mg total) by mouth daily.  Dispense: 30 tablet; Refill: 3  2. Congestive heart failure, unspecified HF chronicity, unspecified heart failure type ([place]) Improved energy and edema. Will increase Lasix to 40 mg qd and proceed with cardiology evaluation with echocardiogram on 12-14-19. - CBC with Differential/Platelet - Comprehensive metabolic panel - furosemide (LASIX) 40 MG tablet; Take 1 tablet (40 mg total) by mouth daily.  Dispense: 30 tablet; Refill: 3   No follow-ups on file.      Andres Shad, PA, have reviewed all documentation for this visit. The documentation on 12/07/19 for the exam, diagnosis, procedures, and orders are all accurate and complete.    Vernie Murders, [place] [phone] (phone) [phone] (fax)  [place]

## 2019-12-07 ENCOUNTER — Other Ambulatory Visit: Payer: Self-pay

## 2019-12-07 ENCOUNTER — Encounter: Payer: Self-pay | Admitting: Family Medicine

## 2019-12-07 ENCOUNTER — Ambulatory Visit (INDEPENDENT_AMBULATORY_CARE_PROVIDER_SITE_OTHER): Payer: Medicare Other | Admitting: Family Medicine

## 2019-12-07 VITALS — BP 128/79 | HR 55 | Temp 97.8°F | Resp 20 | Wt 357.0 lb

## 2019-12-07 DIAGNOSIS — I509 Heart failure, unspecified: Secondary | ICD-10-CM

## 2019-12-07 DIAGNOSIS — R609 Edema, unspecified: Secondary | ICD-10-CM | POA: Diagnosis not present

## 2019-12-07 MED ORDER — FUROSEMIDE 40 MG PO TABS: 40.0000 mg | ORAL_TABLET | Freq: Every day | ORAL | 3 refills | Status: DC

## 2019-12-08 LAB — CBC WITH DIFFERENTIAL/PLATELET
Basophils Absolute: 0.1 10*3/uL (ref 0.0–0.2)
Basos: 2 %
EOS (ABSOLUTE): 0.1 10*3/uL (ref 0.0–0.4)
Eos: 2 %
Hematocrit: 39.5 % (ref 37.5–51.0)
Hemoglobin: 12.3 g/dL — ABNORMAL LOW (ref 13.0–17.7)
Immature Grans (Abs): 0 10*3/uL (ref 0.0–0.1)
Immature Granulocytes: 0 %
Lymphocytes Absolute: 2.3 10*3/uL (ref 0.7–3.1)
Lymphs: 28 %
MCH: 26.2 pg — ABNORMAL LOW (ref 26.6–33.0)
MCHC: 31.1 g/dL — ABNORMAL LOW (ref 31.5–35.7)
MCV: 84 fL (ref 79–97)
Monocytes Absolute: 0.9 10*3/uL (ref 0.1–0.9)
Monocytes: 11 %
Neutrophils Absolute: 4.8 10*3/uL (ref 1.4–7.0)
Neutrophils: 57 %
Platelets: 197 10*3/uL (ref 150–450)
RBC: 4.7 x10E6/uL (ref 4.14–5.80)
RDW: 14.6 % (ref 11.6–15.4)
WBC: 8.2 10*3/uL (ref 3.4–10.8)

## 2019-12-08 LAB — COMPREHENSIVE METABOLIC PANEL
ALT: 9 IU/L (ref 0–44)
AST: 19 IU/L (ref 0–40)
Albumin/Globulin Ratio: 1.4 (ref 1.2–2.2)
Albumin: 4.2 g/dL (ref 3.8–4.8)
Alkaline Phosphatase: 82 IU/L (ref 48–121)
BUN/Creatinine Ratio: 13 (ref 10–24)
BUN: 14 mg/dL (ref 8–27)
Bilirubin Total: 0.6 mg/dL (ref 0.0–1.2)
CO2: 24 mmol/L (ref 20–29)
Calcium: 9 mg/dL (ref 8.6–10.2)
Chloride: 99 mmol/L (ref 96–106)
Creatinine, Ser: 1.12 mg/dL (ref 0.76–1.27)
GFR calc Af Amer: 81 mL/min/{1.73_m2} (ref >59)
GFR calc non Af Amer: 70 mL/min/{1.73_m2} (ref >59)
Globulin, Total: 3 g/dL (ref 1.5–4.5)
Glucose: 111 mg/dL — ABNORMAL HIGH (ref 65–99)
Potassium: 4.4 mmol/L (ref 3.5–5.2)
Sodium: 140 mmol/L (ref 134–144)
Total Protein: 7.2 g/dL (ref 6.0–8.5)

## 2019-12-12 ENCOUNTER — Telehealth: Payer: Self-pay

## 2019-12-12 ENCOUNTER — Other Ambulatory Visit: Payer: Self-pay

## 2019-12-12 ENCOUNTER — Ambulatory Visit (INDEPENDENT_AMBULATORY_CARE_PROVIDER_SITE_OTHER): Payer: Medicare Other

## 2019-12-12 DIAGNOSIS — R0600 Dyspnea, unspecified: Secondary | ICD-10-CM | POA: Diagnosis not present

## 2019-12-12 MED ORDER — PERFLUTREN LIPID MICROSPHERE
1.0000 mL | INTRAVENOUS | Status: AC | PRN
  Administered 2019-12-12: 2 mL via INTRAVENOUS

## 2019-12-12 NOTE — Telephone Encounter (Signed)
Patient was here for an echocardiogram. Our echo tech Helmut Muster informed us that the patient's EF was probably below 20%.  When I started the IV on him today, his SOB was noticeable during our conversing. He stated that he was feeling a little better since [facility] when they increased his Lasix to 40 mg daily, but that he is still SOB with any activity.  I was able to reschedule patient with Dr. Azucena Cecil for this Friday 7/9 and cancelled his 7/22 appointment.

## 2019-12-12 NOTE — Telephone Encounter (Signed)
Patient called, left VM to return the call to the office. 

## 2019-12-12 NOTE — Telephone Encounter (Signed)
Called to advise patient of lab results. LVMTCB, if patient calls back it is okay for someone else to advise.

## 2019-12-12 NOTE — Telephone Encounter (Signed)
-----   Message from Tamsen Roers, [place] sent at 12/08/2019  6:07 PM EDT ----- Labs essentially normal with only mild variations. No sign of electrolyte imbalances. Proceed with cardiology referral and using medication regularly. Recheck with Dr. Sherrie Mustache on 01-23-20 as planned or return sooner if needed.

## 2019-12-13 ENCOUNTER — Other Ambulatory Visit: Payer: Self-pay | Admitting: Family Medicine

## 2019-12-13 NOTE — Telephone Encounter (Signed)
Attempted to contact pt.  Left vm to return call to office to discuss lab results.

## 2019-12-13 NOTE — Telephone Encounter (Signed)
Patient given results as noted by Dortha Kern, PA on 12/08/19, patient verbalized understanding. He asks is there a way to leave messages about his labs on his phone, because he doesn't always have this phone with him. I advised the next time he's in the office to ask for a form to sign saying it's ok to leave messages on his phone, he verbalized understanding.

## 2019-12-13 NOTE — Telephone Encounter (Signed)
Requested Prescriptions  Pending Prescriptions Disp Refills  . metFORMIN (GLUCOPHAGE) 1000 MG tablet [Pharmacy Med Name: METFORMIN HCL 1,000 MG TABLET] 180 tablet 0    Sig: TAKE 1 TABLET BY MOUTH TWICE DAILY     Endocrinology:  Diabetes - Biguanides Passed - 12/13/2019 11:42 AM      Passed - Cr in normal range and within 360 days    Creatinine, Ser  Date Value Ref Range Status  12/07/2019 1.12 0.76 - 1.27 mg/dL Final   Creatinine, POC  Date Value Ref Range Status  03/31/2017 n/a mg/dL Final         Passed - HBA1C is between 0 and 7.9 and within 180 days    Hgb A1c MFr Bld  Date Value Ref Range Status  09/25/2019 6.9 (H) 4.8 - 5.6 % Final    Comment:             Prediabetes: 5.7 - 6.4          Diabetes: >6.4          Glycemic control for adults with diabetes: <7.0          Passed - eGFR in normal range and within 360 days    GFR calc Af Amer  Date Value Ref Range Status  12/07/2019 81 >59 mL/min/1.73 Final    Comment:    **[facility] currently reports eGFR in compliance with the current**   recommendations of the [employer]. [facility] will   update reporting as new guidelines are published from the NKF-ASN   Task force.    GFR calc non Af Amer  Date Value Ref Range Status  12/07/2019 70 >59 mL/min/1.73 Final         Passed - Valid encounter within last 6 months    Recent Outpatient Visits          6 days ago Edema, unspecified type   [place], [place]   1 week ago Edema, unspecified type   [facility], [place], [place]   1 month ago Edema, unspecified type   [facility] Birdie Sons, MD   1 month ago Nausea   [facility] Birdie Sons, MD   2 months ago Annual physical exam   [facility] Birdie Sons, MD      Future Appointments            In 2 days Agbor-Etang, Aaron Edelman, MD [facility], [place]   In 1 month Fisher, Kirstie Peri,  MD [facility], [place]           . glipiZIDE (GLUCOTROL XL) 10 MG 24 hr tablet [Pharmacy Med Name: GLIPIZIDE ER 10MG TABLET] 90 tablet 0    Sig: TAKE 1 TABLET BY MOUTH ONCE DAILY.     Endocrinology:  Diabetes - Sulfonylureas Passed - 12/13/2019 11:42 AM      Passed - HBA1C is between 0 and 7.9 and within 180 days    Hgb A1c MFr Bld  Date Value Ref Range Status  09/25/2019 6.9 (H) 4.8 - 5.6 % Final    Comment:             Prediabetes: 5.7 - 6.4          Diabetes: >6.4          Glycemic control for adults with diabetes: <7.0          Passed - Valid encounter within last 6 months  Recent Outpatient Visits          6 days ago Edema, unspecified type   [place], [place]   1 week ago Edema, unspecified type   [place], [place], [place]   1 month ago Edema, unspecified type   [facility] Birdie Sons, MD   1 month ago Nausea   [facility] Birdie Sons, MD   2 months ago Annual physical exam   [facility] Birdie Sons, MD      Future Appointments            In 2 days Agbor-Etang, Aaron Edelman, MD [facility]   In 1 month Fisher, Kirstie Peri, MD [facility], [place]

## 2019-12-15 ENCOUNTER — Other Ambulatory Visit: Payer: Self-pay

## 2019-12-15 ENCOUNTER — Encounter: Payer: Self-pay | Admitting: Cardiology

## 2019-12-15 ENCOUNTER — Ambulatory Visit: Payer: Medicare Other | Admitting: Cardiology

## 2019-12-15 VITALS — BP 120/80 | HR 105 | Ht 72.0 in | Wt 354.5 lb

## 2019-12-15 DIAGNOSIS — I10 Essential (primary) hypertension: Secondary | ICD-10-CM | POA: Diagnosis not present

## 2019-12-15 DIAGNOSIS — I4891 Unspecified atrial fibrillation: Secondary | ICD-10-CM

## 2019-12-15 DIAGNOSIS — R400 Somnolence: Secondary | ICD-10-CM | POA: Diagnosis not present

## 2019-12-15 DIAGNOSIS — I5020 Unspecified systolic (congestive) heart failure: Secondary | ICD-10-CM

## 2019-12-15 MED ORDER — METOPROLOL SUCCINATE ER 100 MG PO TB24
100.0000 mg | ORAL_TABLET | Freq: Every day | ORAL | 6 refills | Status: DC
Start: 2019-12-15 — End: 2020-07-30

## 2019-12-15 NOTE — Patient Instructions (Signed)
Medication Instructions:   Your physician has recommended you make the following change in your medication:   STOP taking your  Lopressor (Metoprolol Tartrate). START Toprol XL (Metoprolol Succinate): Take 1 tablet (100 mg total) by mouth daily. CONTINUE taking your Lasix: Take 1 tablet (40mg  total) by mouth daily.  *If you need a refill on your cardiac medications before your next appointment, please call your pharmacy*   Lab Work: None Ordered  If you have labs (blood work) drawn today and your tests are completely normal, you will receive your results only by: MyChart Message (if you have MyChart) OR . A paper copy in the mail If you have any lab test that is abnormal or we need to change your treatment, we will call you to review the results.   Testing/Procedures: None Ordered   Follow-Up: At [facility], you and your health needs are our priority.  As part of our continuing mission to provide you with exceptional heart care, we have created designated Provider Care Teams.  These Care Teams include your primary Cardiologist (physician) and Advanced Practice Providers (APPs -  Physician Assistants and Nurse Practitioners) who all work together to provide you with the care you need, when you need it.  We recommend signing up for the patient portal called "MyChart".  Sign up information is provided on this After Visit Summary.  MyChart is used to connect with patients for Virtual Visits (Telemedicine).  Patients are able to view lab/test results, encounter notes, upcoming appointments, etc.  Non-urgent messages can be sent to your provider as well.   To learn more about what you can do with MyChart, go to [facility].    Your next appointment:   2 week(s)  The format for your next appointment:   In Person  Provider:   [email or web address], MD   Other Instructions  Call your insurance provider and ask if Xaralto 20 mg, 1 tablet once a day would be cheaper than  your current Eliquis prescription.

## 2019-12-15 NOTE — Progress Notes (Signed)
Cardiology Office Note:    Date:  12/15/2019   ID:  Taylor [name], DOB [birth year], MRN [id number]  PCP:  Birdie Sons, MD  Cardiologist:  Kate Sable, MD  Electrophysiologist:  None   Referring MD: Birdie Sons, MD   Chief Complaint  Patient presents with  . other    Follow up Echo. Meds reviewed by the pt. verbally. Pt. c/o shortness of breath and LE edema.     History of Present Illness:    Taylor [name] is a 64 y.o. male with a hx of hypertension, diabetes, atrial fibrillation who presents for follow-up.  He was last seen due to worsening dyspnea on exertion and edema for about 6 months.  Also complained of daytime somnolence and snoring.  Echocardiogram was ordered.  Referral to pulmonary medicine for sleep apnea eval was placed.  Patient still has shortness of breath and edema although improved.  Was noted to be in atrial fibrillation, Lopressor and Eliquis started.  He was subsequently started on Lasix 40 mg daily which has improved his symptoms of both edema and shortness of breath.  Patient states Eliquis causes too expensive with a co-pay of $160 monthly.   Past Medical History:  Diagnosis Date  . Arthritis   . Broken leg 02/07/2019  . Depression   . Diabetes mellitus without complication ([place])   . Hypertension     Past Surgical History:  Procedure Laterality Date  . BACK SURGERY      Current Medications: Current Meds  Medication Sig  . apixaban (ELIQUIS) 5 MG TABS tablet Take 1 tablet (5 mg total) by mouth 2 (two) times daily.  . dapagliflozin propanediol (FARXIGA) 5 MG TABS tablet Take 1 tablet (5 mg total) by mouth daily before breakfast.  . furosemide (LASIX) 40 MG tablet Take 1 tablet (40 mg total) by mouth daily.  Marland Kitchen glipiZIDE (GLUCOTROL XL) 10 MG 24 hr tablet TAKE 1 TABLET BY MOUTH ONCE DAILY.  Marland Kitchen glucose blood (ONETOUCH ULTRA) test strip Use to check sugar daily for type 2 diabetes E11.9  . HYDROmorphone (DILAUDID) 4 MG tablet Take 4 mg  by mouth every 6 (six) hours as needed (for break through pain).   Marland Kitchen lisinopril (ZESTRIL) 10 MG tablet TAKE 1 TABLET BY MOUTH ONCE DAILY.  . meloxicam (MOBIC) 7.5 MG tablet Take 7.5 mg by mouth 2 (two) times daily.  . metFORMIN (GLUCOPHAGE) 1000 MG tablet TAKE 1 TABLET BY MOUTH TWICE DAILY  . NARCAN 4 MG/0.1ML LIQD nasal spray kit   . OXYCONTIN 30 MG 12 hr tablet Take 30 mg by mouth 4 (four) times daily as needed.   . pioglitazone (ACTOS) 45 MG tablet TAKE 1 TABLET BY MOUTH ONCE DAILY.  Marland Kitchen pregabalin (LYRICA) 100 MG capsule Take 100 mg by mouth 3 (three) times daily.   . simvastatin (ZOCOR) 40 MG tablet TAKE 1 TABLET BY MOUTH ONCE DAILY.  Marland Kitchen testosterone cypionate (DEPOTESTOSTERONE CYPIONATE) 200 MG/ML injection INJCET 2ML I.M. ONCE EVERY 14 DAYS.  Marland Kitchen zolpidem (AMBIEN CR) 12.5 MG CR tablet AMBIEN CR, 12.5MG (Oral Tablet Extended Release)  1 PO QHS for 0 days  Quantity: 30.00;  Refills: 0   Ordered :15-Jul-2010  Reginia Forts MD;  Started 03-Mar-2007 Active  . [DISCONTINUED] metoprolol tartrate (LOPRESSOR) 25 MG tablet Take 1 tablet (25 mg total) by mouth 2 (two) times daily.     Allergies:   Other   Social History   Socioeconomic History  . Marital status: Single  Spouse name: Not on file  . Number of children: 1  . Years of education: Not on file  . Highest education level: Some college, no degree  Occupational History  . Occupation: disability/retired  Tobacco Use  . Smoking status: Never Smoker  . Smokeless tobacco: Never Used  Vaping Use  . Vaping Use: Never used  Substance and Sexual Activity  . Alcohol use: No    Alcohol/week: 0.0 standard drinks  . Drug use: No  . Sexual activity: Not on file  Other Topics Concern  . Not on file  Social History Narrative  . Not on file   Social Determinants of Health   Financial Resource Strain:   . Difficulty of Paying Living Expenses:   Food Insecurity:   . Worried About Charity fundraiser in the Last Year:   . Academic librarian in the Last Year:   Transportation Needs:   . Film/video editor (Medical):   Marland Kitchen Lack of Transportation (Non-Medical):   Physical Activity:   . Days of Exercise per Week:   . Minutes of Exercise per Session:   Stress:   . Feeling of Stress :   Social Connections:   . Frequency of Communication with Friends and Family:   . Frequency of Social Gatherings with Friends and Family:   . Attends Religious Services:   . Active Member of Clubs or Organizations:   . Attends Archivist Meetings:   Marland Kitchen Marital Status:      Family History: The patient's family history includes Diabetes in his mother and sister.  ROS:   Please see the history of present illness.     All other systems reviewed and are negative.  EKGs/Labs/Other Studies Reviewed:    The following studies were reviewed today:   EKG:  EKG is  ordered today.  The ekg ordered today demonstrates atrial fibrillation, HR 105  Recent Labs: 12/07/2019: ALT 9; BUN 14; Creatinine, Ser 1.12; Hemoglobin 12.3; Platelets 197; Potassium 4.4; Sodium 140  Recent Lipid Panel    Component Value Date/Time   CHOL 140 09/25/2019 1509   TRIG 106 09/25/2019 1509   HDL 46 09/25/2019 1509   CHOLHDL 3.0 09/25/2019 1509   LDLCALC 75 09/25/2019 1509    Physical Exam:    VS:  BP 120/80 (BP Location: Left Arm, Patient Position: Sitting, Cuff Size: Large)   Pulse (!) 105   Ht 6' (1.829 m)   Wt (!) 354 lb 8 oz (160.8 kg)   SpO2 96%   BMI 48.08 kg/m     Wt Readings from Last 3 Encounters:  12/15/19 (!) 354 lb 8 oz (160.8 kg)  12/07/19 (!) 357 lb (161.9 kg)  11/30/19 (!) 360 lb (163.3 kg)     GEN:  Well nourished, well developed in no acute distress HEENT: Normal NECK: No JVD; No carotid bruits LYMPHATICS: No lymphadenopathy CARDIAC: Irregular irregular, tachycardic, no murmurs, rubs, gallops RESPIRATORY:  Clear to auscultation without rales, wheezing or rhonchi  ABDOMEN: Soft, non-tender, non-distended MUSCULOSKELETAL:   1+ edema; No deformity  SKIN: Warm and dry NEUROLOGIC:  Alert and oriented x 3 PSYCHIATRIC:  Normal affect   ASSESSMENT:    1. Atrial fibrillation, unspecified type ([place])   2. HFrEF (heart failure with reduced ejection fraction) (HCC)   3. Daytime somnolence   4. Essential hypertension    PLAN:    In order of problems listed above:  1. Patient persistent atrial fibrillation, HR improved to 105.  CHA2DS2-VASc  score at 3 (chf,htn. DM).  Stop lopressor, start toprol xl 126m qd.  Continue Eliquis twice daily for now.  We will reach out to Eliquis rep regarding cost.  If still expensive, okay with switching to Xarelto if that is cheaper. 2. Patient with newly diagnosed heart failure reduced ejection fraction..  Echocardiogram showed moderate to severely reduced ejection fraction, EF 30 to 35%.  He describes NYHA class III symptoms.  Start Toprol-XL 1058m continue lisinopril 10 mg daily.  Continue Lasix 40 mg daily, last creatinine and potassium normal.  Plan for ischemic work-up when patient is euvolemic, hopefully at follow-up visit 3. Patient with daytime somnolence, snoring, fatigue, morbidly obese.  Likely has OSA.  Advised him to keep appointment with pulmonary medicine. 4. Patient with history of hypertension.  Blood pressure controlled.  Continue medications as above.  Follow-up in 2 weeks.  Total encounter time 45  minutes  Greater than 50% was spent in counseling and coordination of care with the patient   This note was generated in part or whole with voice recognition software. Voice recognition is usually quite accurate but there are transcription errors that can and very often do occur. I apologize for any typographical errors that were not detected and corrected.  Medication Adjustments/Labs and Tests Ordered: Current medicines are reviewed at length with the patient today.  Concerns regarding medicines are outlined above.  Orders Placed This Encounter  Procedures  . EKG  12-Lead   Meds ordered this encounter  Medications  . metoprolol succinate (TOPROL-XL) 100 MG 24 hr tablet    Sig: Take 1 tablet (100 mg total) by mouth daily. Take with or immediately following a meal.    Dispense:  30 tablet    Refill:  6    Patient Instructions  Medication Instructions:   Your physician has recommended you make the following change in your medication:   STOP taking your  Lopressor (Metoprolol Tartrate). START Toprol XL (Metoprolol Succinate): Take 1 tablet (100 mg total) by mouth daily. CONTINUE taking your Lasix: Take 1 tablet (4066motal) by mouth daily.  *If you need a refill on your cardiac medications before your next appointment, please call your pharmacy*   Lab Work: None Ordered  If you have labs (blood work) drawn today and your tests are completely normal, you will receive your results only by: . MMarland KitchenChart Message (if you have MyChart) OR . A paper copy in the mail If you have any lab test that is abnormal or we need to change your treatment, we will call you to review the results.   Testing/Procedures: None Ordered   Follow-Up: At [facility] and your health needs are our priority.  As part of our continuing mission to provide you with exceptional heart care, we have created designated Provider Care Teams.  These Care Teams include your primary Cardiologist (physician) and Advanced Practice Providers (APPs -  Physician Assistants and Nurse Practitioners) who all work together to provide you with the care you need, when you need it.  We recommend signing up for the patient portal called "MyChart".  Sign up information is provided on this After Visit Summary.  MyChart is used to connect with patients for Virtual Visits (Telemedicine).  Patients are able to view lab/test results, encounter notes, upcoming appointments, etc.  Non-urgent messages can be sent to your provider as well.   To learn more about what you can do with MyChart, go to  [email or web address]  Your next appointment:  2 week(s)  The format for your next appointment:   In Person  Provider:   Kate Sable, MD   Other Instructions  Call your insurance provider and ask if Xaralto 20 mg, 1 tablet once a day would be cheaper than your current Eliquis prescription.     Signed, Kate Sable, MD  12/15/2019 1:04 PM    [facility]

## 2019-12-19 ENCOUNTER — Telehealth: Payer: Self-pay | Admitting: Family Medicine

## 2019-12-19 NOTE — Telephone Encounter (Signed)
Please see if patient needs more sample of [place]. There is a bag with a months supply on my workstation for him.

## 2019-12-20 NOTE — Telephone Encounter (Signed)
Patient states he does need samples. Samples put at front desk for pick up.

## 2019-12-22 ENCOUNTER — Institutional Professional Consult (permissible substitution): Payer: Medicare Other | Admitting: Primary Care

## 2019-12-28 ENCOUNTER — Ambulatory Visit: Payer: Medicare Other | Admitting: Cardiology

## 2019-12-28 NOTE — Telephone Encounter (Signed)
Patient advised samples are at the front desk for pick up.

## 2019-12-28 NOTE — Telephone Encounter (Signed)
Pt called in to confirm pickup for samples. Advised pt of message below. Pt says that he has to travel pretty far to pick up and would like to confirm with assistant first before coming.    Please advise.

## 2020-01-01 ENCOUNTER — Ambulatory Visit: Payer: Medicare Other | Admitting: Cardiology

## 2020-01-01 ENCOUNTER — Other Ambulatory Visit: Payer: Self-pay | Admitting: Family Medicine

## 2020-01-01 DIAGNOSIS — I10 Essential (primary) hypertension: Secondary | ICD-10-CM

## 2020-01-15 ENCOUNTER — Encounter: Payer: Self-pay | Admitting: Cardiology

## 2020-01-15 ENCOUNTER — Other Ambulatory Visit: Payer: Self-pay

## 2020-01-15 ENCOUNTER — Ambulatory Visit: Payer: Medicare Other | Admitting: Cardiology

## 2020-01-15 VITALS — BP 140/88 | HR 118 | Ht 72.0 in | Wt 363.2 lb

## 2020-01-15 DIAGNOSIS — I10 Essential (primary) hypertension: Secondary | ICD-10-CM

## 2020-01-15 DIAGNOSIS — R609 Edema, unspecified: Secondary | ICD-10-CM | POA: Diagnosis not present

## 2020-01-15 DIAGNOSIS — I5020 Unspecified systolic (congestive) heart failure: Secondary | ICD-10-CM

## 2020-01-15 DIAGNOSIS — I509 Heart failure, unspecified: Secondary | ICD-10-CM | POA: Diagnosis not present

## 2020-01-15 DIAGNOSIS — I4891 Unspecified atrial fibrillation: Secondary | ICD-10-CM | POA: Diagnosis not present

## 2020-01-15 MED ORDER — POTASSIUM CHLORIDE CRYS ER 20 MEQ PO TBCR
20.0000 meq | EXTENDED_RELEASE_TABLET | Freq: Every day | ORAL | 5 refills | Status: DC
Start: 2020-01-15 — End: 2021-02-25

## 2020-01-15 MED ORDER — FUROSEMIDE 40 MG PO TABS: 40.0000 mg | ORAL_TABLET | Freq: Two times a day (BID) | ORAL | 5 refills | Status: DC

## 2020-01-15 NOTE — Patient Instructions (Signed)
Medication Instructions:   Your physician has recommended you make the following change in your medication:   1.  INCREASE your Lasix (Furosemide) 40mg : Take 1 tablet (40 mg total) by mouth 2 (two) times daily. 2.  START taking potassium chloride SA (KLOR-CON) 20 MEQ tablet: Take 1 tablet (20 mEq total) by mouth daily.  *If you need a refill on your cardiac medications before your next appointment, please call your pharmacy*   Lab Work: None Ordered If you have labs (blood work) drawn today and your tests are completely normal, you will receive your results only by: MyChart Message (if you have MyChart) OR . A paper copy in the mail If you have any lab test that is abnormal or we need to change your treatment, we will call you to review the results.   Testing/Procedures: None Ordered   Follow-Up: At [facility], you and your health needs are our priority.  As part of our continuing mission to provide you with exceptional heart care, we have created designated Provider Care Teams.  These Care Teams include your primary Cardiologist (physician) and Advanced Practice Providers (APPs -  Physician Assistants and Nurse Practitioners) who all work together to provide you with the care you need, when you need it.  We recommend signing up for the patient portal called "MyChart".  Sign up information is provided on this After Visit Summary.  MyChart is used to connect with patients for Virtual Visits (Telemedicine).  Patients are able to view lab/test results, encounter notes, upcoming appointments, etc.  Non-urgent messages can be sent to your provider as well.   To learn more about what you can do with MyChart, go to [facility].    Your next appointment:   1 month(s)  The format for your next appointment:   In Person  Provider:   [email or web address], MD   Other Instructions

## 2020-01-15 NOTE — Progress Notes (Signed)
Cardiology Office Note:    Date:  01/15/2020   ID:  Taylor [name], DOB 1956/01/31, MRN [id number]  PCP:  Birdie Sons, MD  Cardiologist:  Kate Sable, MD  Electrophysiologist:  None   Referring MD: Birdie Sons, MD   Chief Complaint  Patient presents with  . other    2 week follow up and discuss Echo. Pt. c/o shortness of breath, LE edema and abdominal swelling.     History of Present Illness:    Taylor [name] is a 64 y.o. male with a hx of hypertension, diabetes, atrial fibrillation, heart failure reduced ejection fraction, EF 30 to 35% who presents for follow-up.  Previously seen for worsening shortness of breath and A. fib.  Beta-blocker was titrated, diuretic recommended as patient was volume overloaded..  Cost issues in contact with Eliquis prescription.  Ischemic work-up was deferred to when patient is euvolemic.  Patient still has some edema and orthopnea.  Has appointment with pulmonary medicine as regards to obstructive sleep apnea and CPAP evaluation.   Past Medical History:  Diagnosis Date  . Arthritis   . Broken leg 02/07/2019  . Depression   . Diabetes mellitus without complication ([place])   . Hypertension     Past Surgical History:  Procedure Laterality Date  . BACK SURGERY      Current Medications: Current Meds  Medication Sig  . apixaban (ELIQUIS) 5 MG TABS tablet Take 1 tablet (5 mg total) by mouth 2 (two) times daily.  . dapagliflozin propanediol (FARXIGA) 5 MG TABS tablet Take 1 tablet (5 mg total) by mouth daily before breakfast.  . furosemide (LASIX) 40 MG tablet Take 1 tablet (40 mg total) by mouth 2 (two) times daily.  Marland Kitchen glipiZIDE (GLUCOTROL XL) 10 MG 24 hr tablet TAKE 1 TABLET BY MOUTH ONCE DAILY.  Marland Kitchen glucose blood (ONETOUCH ULTRA) test strip Use to check sugar daily for type 2 diabetes E11.9  . HYDROmorphone (DILAUDID) 4 MG tablet Take 4 mg by mouth every 6 (six) hours as needed (for break through pain).   Marland Kitchen lisinopril (ZESTRIL)  10 MG tablet TAKE 1 TABLET BY MOUTH ONCE DAILY.  . meloxicam (MOBIC) 7.5 MG tablet Take 7.5 mg by mouth 2 (two) times daily.  . metFORMIN (GLUCOPHAGE) 1000 MG tablet TAKE 1 TABLET BY MOUTH TWICE DAILY  . metoprolol succinate (TOPROL-XL) 100 MG 24 hr tablet Take 1 tablet (100 mg total) by mouth daily. Take with or immediately following a meal.  . NARCAN 4 MG/0.1ML LIQD nasal spray kit   . OXYCONTIN 30 MG 12 hr tablet Take 30 mg by mouth 4 (four) times daily as needed.   . pioglitazone (ACTOS) 45 MG tablet TAKE 1 TABLET BY MOUTH ONCE DAILY.  Marland Kitchen pregabalin (LYRICA) 100 MG capsule Take 100 mg by mouth 3 (three) times daily.   . simvastatin (ZOCOR) 40 MG tablet TAKE 1 TABLET BY MOUTH ONCE DAILY.  Marland Kitchen testosterone cypionate (DEPOTESTOSTERONE CYPIONATE) 200 MG/ML injection INJCET 2ML I.M. ONCE EVERY 14 DAYS.  Marland Kitchen zolpidem (AMBIEN CR) 12.5 MG CR tablet AMBIEN CR, 12.5MG (Oral Tablet Extended Release)  1 PO QHS for 0 days  Quantity: 30.00;  Refills: 0   Ordered :15-Jul-2010  Reginia Forts MD;  Started 03-Mar-2007 Active  . [DISCONTINUED] furosemide (LASIX) 40 MG tablet Take 1 tablet (40 mg total) by mouth daily.     Allergies:   Other   Social History   Socioeconomic History  . Marital status: Single    Spouse  name: Not on file  . Number of children: 1  . Years of education: Not on file  . Highest education level: Some college, no degree  Occupational History  . Occupation: disability/retired  Tobacco Use  . Smoking status: Never Smoker  . Smokeless tobacco: Never Used  Vaping Use  . Vaping Use: Never used  Substance and Sexual Activity  . Alcohol use: No    Alcohol/week: 0.0 standard drinks  . Drug use: No  . Sexual activity: Not on file  Other Topics Concern  . Not on file  Social History Narrative  . Not on file   Social Determinants of Health   Financial Resource Strain:   . Difficulty of Paying Living Expenses:   Food Insecurity:   . Worried About Charity fundraiser in the  Last Year:   . Arboriculturist in the Last Year:   Transportation Needs:   . Film/video editor (Medical):   Marland Kitchen Lack of Transportation (Non-Medical):   Physical Activity:   . Days of Exercise per Week:   . Minutes of Exercise per Session:   Stress:   . Feeling of Stress :   Social Connections:   . Frequency of Communication with Friends and Family:   . Frequency of Social Gatherings with Friends and Family:   . Attends Religious Services:   . Active Member of Clubs or Organizations:   . Attends Archivist Meetings:   Marland Kitchen Marital Status:      Family History: The patient's family history includes Diabetes in his mother and sister.  ROS:   Please see the history of present illness.     All other systems reviewed and are negative.  EKGs/Labs/Other Studies Reviewed:    The following studies were reviewed today:   EKG:  EKG is  ordered today.  The ekg ordered today demonstrates atrial fibrillation, HR 118  Recent Labs: 12/07/2019: ALT 9; BUN 14; Creatinine, Ser 1.12; Hemoglobin 12.3; Platelets 197; Potassium 4.4; Sodium 140  Recent Lipid Panel    Component Value Date/Time   CHOL 140 09/25/2019 1509   TRIG 106 09/25/2019 1509   HDL 46 09/25/2019 1509   CHOLHDL 3.0 09/25/2019 1509   LDLCALC 75 09/25/2019 1509    Physical Exam:    VS:  BP 140/88 (BP Location: Left Arm, Patient Position: Sitting, Cuff Size: Large)   Pulse (!) 118   Ht 6' (1.829 m)   Wt (!) 363 lb 4 oz (164.8 kg)   SpO2 97%   BMI 49.27 kg/m     Wt Readings from Last 3 Encounters:  01/15/20 (!) 363 lb 4 oz (164.8 kg)  12/15/19 (!) 354 lb 8 oz (160.8 kg)  12/07/19 (!) 357 lb (161.9 kg)     GEN:  Well nourished, well developed in no acute distress HEENT: Normal NECK: No JVD; No carotid bruits LYMPHATICS: No lymphadenopathy CARDIAC: Irregular irregular, tachycardic, no murmurs, rubs, gallops RESPIRATORY:  Clear to auscultation without rales, wheezing or rhonchi  ABDOMEN: Soft, non-tender,  non-distended MUSCULOSKELETAL:  1+ edema; No deformity  SKIN: Warm and dry NEUROLOGIC:  Alert and oriented x 3 PSYCHIATRIC:  Normal affect   ASSESSMENT:    1. Atrial fibrillation, unspecified type ([place])   2. HFrEF (heart failure with reduced ejection fraction) (HCC)   3. Edema, unspecified type   4. Congestive heart failure, unspecified HF chronicity, unspecified heart failure type ([place])   5. Essential hypertension    PLAN:    In order  of problems listed above:  1. Patient persistent atrial fibrillation, HR improved to 105.  CHA2DS2-VASc score at 3 (chf,htn. DM).  Continue toprol xl 162m qd, Eliquis 565m twice daily for now.  Will consider titrating until follow-up follow-up visit. 2. Patient with HFrEF, EF 30-35%.  NYHA class III symptoms. Cont Toprol-XL 10076mcontinue lisinopril 10 mg daily.  increase Lasix to 40 mg bid.  Left heart cath when euvolemic.  Patient educated on weight gain and measurements.  Advised to keep weights less than 3 pounds in a day and less than 5 pounds in a week. 3. Patient with history of hypertension.  Continue Toprol-XL, lisinopril, Lasix as above.  Follow-up in 1 month  Total encounter time 40  minutes  Greater than 50% was spent in counseling and coordination of care with the patient   This note was generated in part or whole with voice recognition software. Voice recognition is usually quite accurate but there are transcription errors that can and very often do occur. I apologize for any typographical errors that were not detected and corrected.  Medication Adjustments/Labs and Tests Ordered: Current medicines are reviewed at length with the patient today.  Concerns regarding medicines are outlined above.  Orders Placed This Encounter  Procedures  . EKG 12-Lead   Meds ordered this encounter  Medications  . furosemide (LASIX) 40 MG tablet    Sig: Take 1 tablet (40 mg total) by mouth 2 (two) times daily.    Dispense:  60 tablet    Refill:  5   . potassium chloride SA (KLOR-CON) 20 MEQ tablet    Sig: Take 1 tablet (20 mEq total) by mouth daily.    Dispense:  30 tablet    Refill:  5    Patient Instructions  Medication Instructions:   Your physician has recommended you make the following change in your medication:   1.  INCREASE your Lasix (Furosemide) 75m5make 1 tablet (40 mg total) by mouth 2 (two) times daily. 2.  START taking potassium chloride SA (KLOR-CON) 20 MEQ tablet: Take 1 tablet (20 mEq total) by mouth daily.  *If you need a refill on your cardiac medications before your next appointment, please call your pharmacy*   Lab Work: None Ordered If you have labs (blood work) drawn today and your tests are completely normal, you will receive your results only by: . MyMarland Kitchenhart Message (if you have MyChart) OR . A paper copy in the mail If you have any lab test that is abnormal or we need to change your treatment, we will call you to review the results.   Testing/Procedures: None Ordered   Follow-Up: At [facility] and your health needs are our priority.  As part of our continuing mission to provide you with exceptional heart care, we have created designated Provider Care Teams.  These Care Teams include your primary Cardiologist (physician) and Advanced Practice Providers (APPs -  Physician Assistants and Nurse Practitioners) who all work together to provide you with the care you need, when you need it.  We recommend signing up for the patient portal called "MyChart".  Sign up information is provided on this After Visit Summary.  MyChart is used to connect with patients for Virtual Visits (Telemedicine).  Patients are able to view lab/test results, encounter notes, upcoming appointments, etc.  Non-urgent messages can be sent to your provider as well.   To learn more about what you can do with MyChart, go to [email or web address]  Your next appointment:   1 month(s)  The format for your next appointment:    In Person  Provider:   Kate Sable, MD   Other Instructions      Signed, Kate Sable, MD  01/15/2020 1:07 PM    [place]

## 2020-01-23 ENCOUNTER — Encounter: Payer: Self-pay | Admitting: Adult Health

## 2020-01-23 ENCOUNTER — Ambulatory Visit: Payer: Self-pay | Admitting: Family Medicine

## 2020-01-23 ENCOUNTER — Other Ambulatory Visit: Payer: Self-pay

## 2020-01-23 ENCOUNTER — Ambulatory Visit: Payer: Medicare Other | Admitting: Adult Health

## 2020-01-23 VITALS — BP 140/82 | HR 91 | Temp 97.1°F | Ht 72.0 in | Wt 350.8 lb

## 2020-01-23 DIAGNOSIS — F11282 Opioid dependence with opioid-induced sleep disorder: Secondary | ICD-10-CM | POA: Diagnosis not present

## 2020-01-23 DIAGNOSIS — G4719 Other hypersomnia: Secondary | ICD-10-CM | POA: Diagnosis not present

## 2020-01-23 NOTE — Assessment & Plan Note (Signed)
Patient has excessive daytime sleepiness, restless sleep, snoring, BMI 47.  Newly diagnosed A. fib and congestive heart failure.  These symptoms and risk factors place patient at high probability of underlying sleep apnea.  We will check a split-night in lab sleep study. Patient education on healthy sleep regimen, decreasing caffeine intake.  Plan  Patient Instructions  Healthy sleep regimen. Use caution with sedating medications (pain meds and sleep meds)  Decrease caffeine intake.  Set up for in lab sleep study  Work on healthy weight loss.  Do not drive if sleepy  Follow up in 6 weeks with virtual visit to discuss results.

## 2020-01-23 NOTE — Progress Notes (Signed)
Reviewed and agree with assessment/plan[place] [id number] [facility] Pulmonary/Critical Care 01/23/2020, 2:19 PM Pager:  [phone]

## 2020-01-23 NOTE — Assessment & Plan Note (Signed)
Discussed cautious use with chronic narcotics and sleep aids.

## 2020-01-23 NOTE — Progress Notes (Signed)
_0  ID: Taylor [name], male    DOB: 10-10-1955, 64 y.o.   MRN: [id number]  Chief Complaint  Patient presents with  . sleep consult    Referring provider: Birdie Sons, MD  HPI: 64 year old male never smoker presents for a sleep consult with daytime sleepiness, snoring and restless sleep. Recently diagnosed new onset A. fib and congestive heart failure May 2021 Medical history significant for DM, Hyperlipidemia, Chronic back pain on narcotics.   TEST/EVENTS :  2D echo July 2021 EF 30 to 35%, right atrium moderately dilated  01/23/2020 sleep consult Patient presents today for a sleep consult.  Patient was referred after recently newly diagnosed A. fib and congestive heart failure.  Patient presented in May to cardiology with increased shortness of breath.  Found to be in new onset A. fib with RVR.  He was started on Lopressor and Eliquis.  2D echo was set up completed in July that showed EF at 30 to 35%.  Right atrium was mildly dilated.  Patient had extensive weight gain over the last several months.  He has improved with his shortness of breath after aggressive diuresis. He was referred to our office for daytime sleepiness and snoring with restless sleep.  Epworth score is 9.  Patient says he does snore.  Feels very restless at times during his sleep.  Wakes up feeling unrefreshed.  Patient says he has trouble going to sleep so he uses Ambien.  Wakes up several times throughout the night for multiple reasons including chronic back pain.  Patient is on chronic narcotics and is disabled due to back injury and pain.  Typically goes to bed around 11 PM and gets up about 9 AM.  Typically can take him up to a couple hours to go to sleep and gets up 3-4 times a night.  Patient is disabled.  Patient's weight has been steady climbing over the last several years his up 100 pounds over the last 10 years.  In the last 6 months is up 40 pounds.  Patient said he drinks 1 L of Coca-Cola  daily. He denies any sleep paralysis, cataplexy symptoms..    Allergies  Allergen Reactions  . Other Itching    Pain medication (brand named)     Immunization History  Administered Date(s) Administered  . Influenza,inj,Quad PF,6+ Mos 03/13/2019  . Pneumococcal Polysaccharide-23 11/27/2013  . Tdap 11/25/2012    Past Medical History:  Diagnosis Date  . Arthritis   . Broken leg 02/07/2019  . Depression   . Diabetes mellitus without complication ([place])   . Hypertension     Tobacco History: Social History   Tobacco Use  Smoking Status Never Smoker  Smokeless Tobacco Never Used   Counseling given: Not Answered  Social history patient is disabled.  He lives with his mom.  He does have children.  Patient quit drinking alcohol in 2001.  Is a never smoker.  Denies any drug use.    Outpatient Medications Prior to Visit  Medication Sig Dispense Refill  . apixaban (ELIQUIS) 5 MG TABS tablet Take 1 tablet (5 mg total) by mouth 2 (two) times daily. 60 tablet 3  . dapagliflozin propanediol (FARXIGA) 5 MG TABS tablet Take 1 tablet (5 mg total) by mouth daily before breakfast. (Patient taking differently: Take 10 mg by mouth daily before breakfast. ) 21 tablet   . furosemide (LASIX) 40 MG tablet Take 1 tablet (40 mg total) by mouth 2 (two) times daily. 60 tablet 5  .  glipiZIDE (GLUCOTROL XL) 10 MG 24 hr tablet TAKE 1 TABLET BY MOUTH ONCE DAILY. 90 tablet 0  . glucose blood (ONETOUCH ULTRA) test strip Use to check sugar daily for type 2 diabetes E11.9 100 each 4  . HYDROmorphone (DILAUDID) 4 MG tablet Take 4 mg by mouth every 6 (six) hours as needed (for break through pain).     Marland Kitchen lisinopril (ZESTRIL) 10 MG tablet TAKE 1 TABLET BY MOUTH ONCE DAILY. 90 tablet 3  . meloxicam (MOBIC) 7.5 MG tablet Take 7.5 mg by mouth 2 (two) times daily.  2  . metFORMIN (GLUCOPHAGE) 1000 MG tablet TAKE 1 TABLET BY MOUTH TWICE DAILY 180 tablet 0  . metoprolol succinate (TOPROL-XL) 100 MG 24 hr tablet Take  1 tablet (100 mg total) by mouth daily. Take with or immediately following a meal. 30 tablet 6  . NARCAN 4 MG/0.1ML LIQD nasal spray kit     . OXYCONTIN 30 MG 12 hr tablet Take 30 mg by mouth 4 (four) times daily as needed.     . pioglitazone (ACTOS) 45 MG tablet TAKE 1 TABLET BY MOUTH ONCE DAILY. 30 tablet 11  . potassium chloride SA (KLOR-CON) 20 MEQ tablet Take 1 tablet (20 mEq total) by mouth daily. 30 tablet 5  . pregabalin (LYRICA) 100 MG capsule Take 100 mg by mouth 3 (three) times daily.     . simvastatin (ZOCOR) 40 MG tablet TAKE 1 TABLET BY MOUTH ONCE DAILY. 90 tablet 3  . testosterone cypionate (DEPOTESTOSTERONE CYPIONATE) 200 MG/ML injection INJCET 2ML I.M. ONCE EVERY 14 DAYS. 10 mL 3  . zolpidem (AMBIEN CR) 12.5 MG CR tablet AMBIEN CR, 12.5MG (Oral Tablet Extended Release)  1 PO QHS for 0 days  Quantity: 30.00;  Refills: 0   Ordered :15-Jul-2010  Reginia Forts MD;  Started 03-Mar-2007 Active     No facility-administered medications prior to visit.     Review of Systems:   Constitutional:   No  weight loss, night sweats,  Fevers, chills, + fatigue, or  lassitude.  HEENT:   No headaches,  Difficulty swallowing,  Tooth/dental problems, or  Sore throat,                No sneezing, itching, ear ache, nasal congestion, post nasal drip,   CV:  No chest pain,  Orthopnea, PND, swelling in lower extremities, anasarca, dizziness, palpitations, syncope.   GI  No heartburn, indigestion, abdominal pain, nausea, vomiting, diarrhea, change in bowel habits, loss of appetite, bloody stools.   Resp: No shortness of breath with exertion or at rest.  No excess mucus, no productive cough,  No non-productive cough,  No coughing up of blood.  No change in color of mucus.  No wheezing.  No chest wall deformity  Skin: no rash or lesions.  GU: no dysuria, change in color of urine, no urgency or frequency.  No flank pain, no hematuria   MS: Chronic back pain   Physical Exam  BP 140/82 (BP  Location: Left Arm, Cuff Size: Normal)   Pulse 91   Temp (!) 97.1 F (36.2 C) (Temporal)   Ht 6' (1.829 m)   Wt (!) 350 lb 12.8 oz (159.1 kg)   SpO2 96%   BMI 47.58 kg/m   GEN: A/Ox3; pleasant , NAD, BMI 47   HEENT:  [place]/AT,   NOSE-clear, THROAT-clear, no lesions, no postnasal drip or exudate noted.  Class IV MP airway  NECK:  Supple w/ fair ROM; no JVD; normal carotid  impulses w/o bruits; no thyromegaly or nodules palpated; no lymphadenopathy.    RESP  Clear  P & A; w/o, wheezes/ rales/ or rhonchi. no accessory muscle use, no dullness to percussion  CARD:  RRR, no m/r/g, 1+peripheral edema, pulses intact, no cyanosis or clubbing.  GI:   Soft & nt; nml bowel sounds; no organomegaly or masses detected.   Musco: Warm bil, no deformities or joint swelling noted.   Neuro: alert, no focal deficits noted.    Skin: Warm, no lesions or rashes    Lab Results:  BNP No results found for: BNP  ProBNP No results found for: PROBNP  Imaging: No results found.  perflutren lipid microspheres (DEFINITY) IV suspension    Date Action Dose Route User   12/12/2019 1137 Given  Intravenous Cira Servant, RVT      No flowsheet data found.  No results found for: NITRICOXIDE      Assessment & Plan:   Excessive daytime sleepiness Patient has excessive daytime sleepiness, restless sleep, snoring, BMI 47.  Newly diagnosed A. fib and congestive heart failure.  These symptoms and risk factors place patient at high probability of underlying sleep apnea.  We will check a split-night in lab sleep study. Patient education on healthy sleep regimen, decreasing caffeine intake.  Plan  Patient Instructions  Healthy sleep regimen. Use caution with sedating medications (pain meds and sleep meds)  Decrease caffeine intake.  Set up for in lab sleep study  Work on healthy weight loss.  Do not drive if sleepy  Follow up in 6 weeks with virtual visit to discuss results.         Morbid  obesity ([place]) Healthy weight loss discussed  Opioid-induced sleep disorder with moderate or severe use disorder, insomnia type ([place]) Discussed cautious use with chronic narcotics and sleep aids.     Rexene Edison, NP 01/23/2020

## 2020-01-23 NOTE — Assessment & Plan Note (Signed)
Healthy weight loss discussed 

## 2020-01-23 NOTE — Assessment & Plan Note (Signed)
>>  ASSESSMENT AND PLAN FOR MORBID OBESITY (HCC) WRITTEN ON 01/23/2020  2:16 PM BY PARRETT, TAMMY S, NP  Healthy weight loss discussed

## 2020-01-23 NOTE — Patient Instructions (Addendum)
Healthy sleep regimen. Use caution with sedating medications (pain meds and sleep meds)  Decrease caffeine intake.  Set up for in lab sleep study  Work on healthy weight loss.  Do not drive if sleepy  Follow up in 6 weeks with virtual visit to discuss results.

## 2020-01-23 NOTE — Assessment & Plan Note (Signed)
>>  ASSESSMENT AND PLAN FOR OPIOID-INDUCED SLEEP DISORDER WITH MODERATE OR SEVERE USE DISORDER, INSOMNIA TYPE (HCC) WRITTEN ON 01/23/2020  2:17 PM BY PARRETT, TAMMY S, NP  Discussed cautious use with chronic narcotics and sleep aids.

## 2020-01-30 ENCOUNTER — Other Ambulatory Visit: Payer: Self-pay

## 2020-01-30 ENCOUNTER — Encounter: Payer: Self-pay | Admitting: Family Medicine

## 2020-01-30 ENCOUNTER — Ambulatory Visit (INDEPENDENT_AMBULATORY_CARE_PROVIDER_SITE_OTHER): Payer: Medicare Other | Admitting: Family Medicine

## 2020-01-30 VITALS — BP 136/96 | HR 76 | Temp 98.1°F | Resp 18 | Wt 349.0 lb

## 2020-01-30 DIAGNOSIS — Z794 Long term (current) use of insulin: Secondary | ICD-10-CM | POA: Diagnosis not present

## 2020-01-30 DIAGNOSIS — H9113 Presbycusis, bilateral: Secondary | ICD-10-CM | POA: Diagnosis not present

## 2020-01-30 DIAGNOSIS — I5023 Acute on chronic systolic (congestive) heart failure: Secondary | ICD-10-CM | POA: Insufficient documentation

## 2020-01-30 DIAGNOSIS — Z1211 Encounter for screening for malignant neoplasm of colon: Secondary | ICD-10-CM | POA: Diagnosis not present

## 2020-01-30 DIAGNOSIS — I5022 Chronic systolic (congestive) heart failure: Secondary | ICD-10-CM | POA: Insufficient documentation

## 2020-01-30 DIAGNOSIS — E1140 Type 2 diabetes mellitus with diabetic neuropathy, unspecified: Secondary | ICD-10-CM | POA: Diagnosis not present

## 2020-01-30 LAB — POCT GLYCOSYLATED HEMOGLOBIN (HGB A1C)
Est. average glucose Bld gHb Est-mCnc: 151
Hemoglobin A1C: 6.9 % — AB (ref 4.0–5.6)

## 2020-01-30 MED ORDER — DAPAGLIFLOZIN PROPANEDIOL 10 MG PO TABS: 10.0000 mg | ORAL_TABLET | Freq: Every day | ORAL | 0 refills | Status: DC

## 2020-01-30 NOTE — Progress Notes (Signed)
I,Roshena L Chambers,acting as a scribe for Lelon Huh, MD.,have documented all relevant documentation on the behalf of Lelon Huh, MD,as directed by  Lelon Huh, MD while in the presence of Lelon Huh, MD.  Established patient visit   Patient: Taylor [name]   DOB: [birth year]   64 y.o. Male  MRN: [id number] Visit Date: 01/30/2020  Today's healthcare provider: Lelon Huh, MD   Chief Complaint  Patient presents with   Congestive Heart Failure   Diabetes   Subjective    HPI   Follow up for CHF/ Edema:  The patient was last seen for this 12/07/2019 by Vernie Murders, PA-C. Changes made at last visit include increasing Lasix to 40 mg daily and advising patient to proceed with cardiology evaluation for echocardiogram. Since that visit, patient was seen by Cardiology and Lasix was increased to 24m twice daily.  He also states his cardiologist had him increase Farxiga to 136mdaily which he is tolerating well.   He reports good compliance with treatment. He feels that condition is Improved. He still has some swelling at the end of the day. He is not having side effects.   -----------------------------------------------------------------------------------------  Diabetes Mellitus Type II, Follow-up  Lab Results  Component Value Date   HGBA1C 6.9 (H) 09/25/2019   HGBA1C 7.1 (A) 03/13/2019   HGBA1C 8.5 (A) 11/07/2018   Wt Readings from Last 3 Encounters:  01/23/20 (!) 350 lb 12.8 oz (159.1 kg)  01/15/20 (!) 363 lb 4 oz (164.8 kg)  12/15/19 (!) 354 lb 8 oz (160.8 kg)   Last seen for diabetes 2 months ago.  Management since then includes restarting Farxiga 69m39maily (sample given). He reports good compliance with treatment. Patient believes the dosage of FarWilder Glade 68m92mily which was increased by Dr. AgboGaren Lahis not having side effects.  Symptoms: Yes fatigue No foot ulcerations  No appetite changes No nausea  Yes paresthesia of the feet  No  polydipsia  No polyuria No visual disturbances   No vomiting     Home blood sugar records: fasting range: 125-130  Episodes of hypoglycemia? No    Current insulin regiment: none Most Recent Eye Exam: not UTD Current exercise: none Current diet habits: in general, an "unhealthy" diet  Pertinent Labs: Lab Results  Component Value Date   CHOL 140 09/25/2019   HDL 46 09/25/2019   LDLCALC 75 09/25/2019   TRIG 106 09/25/2019   CHOLHDL 3.0 09/25/2019   Lab Results  Component Value Date   NA 140 12/07/2019   K 4.4 12/07/2019   CREATININE 1.12 12/07/2019   GFRNONAA 70 12/07/2019   GFRAA 81 12/07/2019   GLUCOSE 111 (H) 12/07/2019     ---------------------------------------------------------------------------------------------------      Medications: Outpatient Medications Prior to Visit  Medication Sig   dapagliflozin propanediol (FARXIGA) 5 MG TABS tablet Take 1 tablet (5 mg total) by mouth daily before breakfast. (Patient taking differently: Take 10 mg by mouth daily before breakfast. )   apixaban (ELIQUIS) 5 MG TABS tablet Take 1 tablet (5 mg total) by mouth 2 (two) times daily.   furosemide (LASIX) 40 MG tablet Take 1 tablet (40 mg total) by mouth 2 (two) times daily.   glipiZIDE (GLUCOTROL XL) 10 MG 24 hr tablet TAKE 1 TABLET BY MOUTH ONCE DAILY.   glucose blood (ONETOUCH ULTRA) test strip Use to check sugar daily for type 2 diabetes E11.9   HYDROmorphone (DILAUDID) 4 MG tablet Take 4 mg by mouth  6 (six) hours as needed (for break through pain).   °• lisinopril (ZESTRIL) 10 MG tablet TAKE 1 TABLET BY MOUTH ONCE DAILY.  °• meloxicam (MOBIC) 7.5 MG tablet Take 7.5 mg by mouth 2 (two) times daily.  °• metFORMIN (GLUCOPHAGE) 1000 MG tablet TAKE 1 TABLET BY MOUTH TWICE DAILY  °• metoprolol succinate (TOPROL-XL) 100 MG 24 hr tablet Take 1 tablet (100 mg total) by mouth daily. Take with or immediately following a meal.  °• NARCAN 4 MG/0.1ML LIQD nasal spray kit   °•  OXYCONTIN 30 MG 12 hr tablet Take 30 mg by mouth 4 (four) times daily as needed.   °• pioglitazone (ACTOS) 45 MG tablet TAKE 1 TABLET BY MOUTH ONCE DAILY.  °• potassium chloride SA (KLOR-CON) 20 MEQ tablet Take 1 tablet (20 mEq total) by mouth daily.  °• pregabalin (LYRICA) 100 MG capsule Take 100 mg by mouth 3 (three) times daily.   °• simvastatin (ZOCOR) 40 MG tablet TAKE 1 TABLET BY MOUTH ONCE DAILY.  °• testosterone cypionate (DEPOTESTOSTERONE CYPIONATE) 200 MG/ML injection INJCET 2ML I.M. ONCE EVERY 14 DAYS.  °• zolpidem (AMBIEN CR) 12.5 MG CR tablet AMBIEN CR, 12.5MG (Oral Tablet Extended Release) ° 1 PO QHS for 0 days ° Quantity: 30.00;  Refills: 0 ° ° Ordered :15-Jul-2010 ° Smith, Kristi MD;  Started 03-Mar-2007 °Active  ° °No facility-administered medications prior to visit.  ° ° °Review of Systems  °Constitutional: Positive for fatigue. Negative for appetite change, chills and fever.  °Respiratory: Positive for shortness of breath (improved). Negative for chest tightness and wheezing.   °Cardiovascular: Positive for leg swelling. Negative for chest pain and palpitations.  °Gastrointestinal: Negative for abdominal pain, nausea and vomiting.  °Neurological:  °     Burning in feet  ° ° ° ° Objective  °  °BP (!) 136/96 (BP Location: Right Arm, Cuff Size: Large)    Pulse 76    Temp 98.1 °F (36.7 °C) (Oral)    Resp 18    Wt (!) 349 lb (158.3 kg)    SpO2 98% Comment: room air   BMI 47.33 kg/m²  ° ° °Physical Exam  ° °General: Appearance:    Severely obese male in no acute distress  °Eyes:    PERRL, conjunctiva/corneas clear, EOM's intact       °Lungs:     Clear to auscultation bilaterally, respirations unlabored  °Heart:    Normal heart rate. Irregularly irregular rhythm. No murmurs, rubs, or gallops.   °MS:   All extremities are intact.   °Neurologic:   Awake, alert, oriented x 3. No apparent focal neurological           defect.   °   °  ° °Results for orders placed or performed in visit on 01/30/20  °POCT HgB  A1C  °Result Value Ref Range  ° Hemoglobin A1C 6.9 (A) 4.0 - 5.6 %  ° Est. average glucose Bld gHb Est-mCnc 151   ° ° Assessment & Plan  °  ° °1. Type 2 diabetes mellitus with diabetic neuropathy, with long-term current use of insulin (HCC) °Doing well with increase from 5mg to - dapagliflozin propanediol (FARXIGA) 10 MG TABS tablet; Take 1 tablet (10 mg total) by mouth daily before breakfast.  Dispense: 35 tablet; Refill: 0 (samples) ° °2. Chronic HFrEF (heart failure with reduced ejection fraction) (HCC) °Much better with diuretics and titration up of Farxiga. Need to check - Renal function panel ° °3. Presbycusis of both ears °Ear   canals not obstructed. He requests - Ambulatory referral to Audiology ° °4. Colon cancer screening °He was given iFOBT kit today.   ° °Future Appointments  °Date Time Provider Department Center  °02/22/2020 11:40 AM Agbor-Etang, Brian, MD [facility]  °05/14/2020  1:40 PM ,  E, MD [facility]  °   °   ° °The entirety of the information documented in the History of Present Illness, Review of Systems and Physical Exam were personally obtained by me. Portions of this information were initially documented by the CMA and reviewed by me for thoroughness and accuracy.  °  ° ° ° , MD  °[facility] °[phone] (phone) °[phone] (fax) ° °[facility] °

## 2020-01-30 NOTE — Patient Instructions (Signed)
.   Please review the attached list of medications and notify my office if there are any errors.   . Please bring all of your medications to every appointment so we can make sure that our medication list is the same as yours.     Try OTC dissolvable Vitamin B12 1000mg  every day to help with neuropathy   If not much better in a month, add OTC alpha-lipoic acid 600 three a day to see if it helps with neuropathy.

## 2020-01-31 LAB — RENAL FUNCTION PANEL
Albumin: 4.4 g/dL (ref 3.8–4.8)
BUN/Creatinine Ratio: 17 (ref 10–24)
BUN: 17 mg/dL (ref 8–27)
CO2: 26 mmol/L (ref 20–29)
Calcium: 9.4 mg/dL (ref 8.6–10.2)
Chloride: 99 mmol/L (ref 96–106)
Creatinine, Ser: 1.01 mg/dL (ref 0.76–1.27)
GFR calc Af Amer: 92 mL/min/{1.73_m2} (ref >59)
GFR calc non Af Amer: 79 mL/min/{1.73_m2} (ref >59)
Glucose: 106 mg/dL — ABNORMAL HIGH (ref 65–99)
Phosphorus: 3.8 mg/dL (ref 2.8–4.1)
Potassium: 4.4 mmol/L (ref 3.5–5.2)
Sodium: 139 mmol/L (ref 134–144)

## 2020-02-07 ENCOUNTER — Other Ambulatory Visit: Payer: Self-pay

## 2020-02-07 MED ORDER — APIXABAN 5 MG PO TABS: 5.0000 mg | ORAL_TABLET | Freq: Two times a day (BID) | ORAL | 6 refills | Status: DC

## 2020-02-14 ENCOUNTER — Other Ambulatory Visit: Payer: Self-pay | Admitting: Family Medicine

## 2020-02-14 NOTE — Telephone Encounter (Signed)
Requested Prescriptions  Pending Prescriptions Disp Refills   glipiZIDE (GLUCOTROL XL) 10 MG 24 hr tablet [Pharmacy Med Name: GLIPIZIDE ER 10MG  TABLET] 90 tablet 1    Sig: TAKE 1 TABLET BY MOUTH ONCE DAILY.     Endocrinology:  Diabetes - Sulfonylureas Passed - 02/14/2020 12:18 PM      Passed - HBA1C is between 0 and 7.9 and within 180 days    Hemoglobin A1C  Date Value Ref Range Status  01/30/2020 6.9 (A) 4.0 - 5.6 % Final   Hgb A1c MFr Bld  Date Value Ref Range Status  09/25/2019 6.9 (H) 4.8 - 5.6 % Final    Comment:             Prediabetes: 5.7 - 6.4          Diabetes: >6.4          Glycemic control for adults with diabetes: <7.0          Passed - Valid encounter within last 6 months    Recent Outpatient Visits          2 weeks ago Type 2 diabetes mellitus with diabetic neuropathy, with long-term current use of insulin [facility])   [facility], MD   2 months ago Edema, unspecified type   [facility] [place], [place]   2 months ago Edema, unspecified type   [place], [place]   3 months ago Edema, unspecified type   [facility], MD   3 months ago Nausea   [facility], MD      Future Appointments            In 1 week Agbor-Etang, Malva Limes, MD [facility], [facility]   In 3 months [facility], [facility], MD [facility], [facility]

## 2020-02-15 DIAGNOSIS — G894 Chronic pain syndrome: Secondary | ICD-10-CM | POA: Diagnosis not present

## 2020-02-22 ENCOUNTER — Other Ambulatory Visit: Payer: Self-pay

## 2020-02-22 ENCOUNTER — Encounter: Payer: Self-pay | Admitting: Adult Health

## 2020-02-22 ENCOUNTER — Ambulatory Visit: Payer: Medicare Other | Admitting: Cardiology

## 2020-02-22 ENCOUNTER — Ambulatory Visit: Payer: Medicare Other | Attending: Pulmonary Disease

## 2020-02-22 ENCOUNTER — Encounter: Payer: Self-pay | Admitting: Cardiology

## 2020-02-22 VITALS — BP 102/72 | HR 88 | Ht 72.0 in | Wt 352.0 lb

## 2020-02-22 DIAGNOSIS — I4891 Unspecified atrial fibrillation: Secondary | ICD-10-CM

## 2020-02-22 DIAGNOSIS — G4733 Obstructive sleep apnea (adult) (pediatric): Secondary | ICD-10-CM | POA: Insufficient documentation

## 2020-02-22 DIAGNOSIS — I10 Essential (primary) hypertension: Secondary | ICD-10-CM | POA: Diagnosis not present

## 2020-02-22 DIAGNOSIS — I5020 Unspecified systolic (congestive) heart failure: Secondary | ICD-10-CM

## 2020-02-22 MED ORDER — TORSEMIDE 20 MG PO TABS: 40.0000 mg | ORAL_TABLET | Freq: Every day | ORAL | 3 refills | Status: DC

## 2020-02-22 NOTE — Progress Notes (Signed)
Cardiology Office Note:    Date:  02/22/2020   ID:  Taylor [name], DOB 1955/06/29, MRN [id number]  PCP:  Birdie Sons, MD  Cardiologist:  Kate Sable, MD  Electrophysiologist:  None   Referring MD: Birdie Sons, MD   Chief Complaint  Patient presents with  . Follow-up    1 Month follow up, swelling in feet has went down. Medications verbally reviewed with patient.     History of Present Illness:    Taylor [name] is a 64 y.o. male with a hx of hypertension, diabetes, atrial fibrillation, heart failure reduced ejection fraction, EF 30 to 35% who presents for follow-up.    Patient is being seen for atrial fibrillation, heart failure reduced ejection fraction.  He is tolerating Toprol-XL 100 mg daily.  Lasix was increased to 40 mg twice daily.  Patient states towards the end of the day he has some edema.  Denies palpitations.  Prior notes Echocardiogram 12/2019 showed moderate to severely reduced EF, EF 30 to 35%.   Past Medical History:  Diagnosis Date  . Arthritis   . Broken leg 02/07/2019  . Depression   . Diabetes mellitus without complication ([place])   . Hypertension     Past Surgical History:  Procedure Laterality Date  . BACK SURGERY      Current Medications: Current Meds  Medication Sig  . apixaban (ELIQUIS) 5 MG TABS tablet Take 1 tablet (5 mg total) by mouth 2 (two) times daily.  . dapagliflozin propanediol (FARXIGA) 10 MG TABS tablet Take 1 tablet (10 mg total) by mouth daily before breakfast.  . glipiZIDE (GLUCOTROL XL) 10 MG 24 hr tablet TAKE 1 TABLET BY MOUTH ONCE DAILY.  Marland Kitchen glucose blood (ONETOUCH ULTRA) test strip Use to check sugar daily for type 2 diabetes E11.9  . HYDROmorphone (DILAUDID) 4 MG tablet Take 4 mg by mouth every 6 (six) hours as needed (for break through pain).   Marland Kitchen lisinopril (ZESTRIL) 10 MG tablet TAKE 1 TABLET BY MOUTH ONCE DAILY.  . meloxicam (MOBIC) 7.5 MG tablet Take 7.5 mg by mouth 2 (two) times daily.  .  metFORMIN (GLUCOPHAGE) 1000 MG tablet TAKE 1 TABLET BY MOUTH TWICE DAILY  . metoprolol succinate (TOPROL-XL) 100 MG 24 hr tablet Take 1 tablet (100 mg total) by mouth daily. Take with or immediately following a meal.  . NARCAN 4 MG/0.1ML LIQD nasal spray kit   . OXYCONTIN 20 MG 12 hr tablet Take 20 mg by mouth 2 (two) times daily.  . OXYCONTIN 30 MG 12 hr tablet Take 30 mg by mouth 4 (four) times daily as needed.   . pioglitazone (ACTOS) 45 MG tablet TAKE 1 TABLET BY MOUTH ONCE DAILY.  Marland Kitchen potassium chloride SA (KLOR-CON) 20 MEQ tablet Take 1 tablet (20 mEq total) by mouth daily.  . pregabalin (LYRICA) 100 MG capsule Take 100 mg by mouth 3 (three) times daily.   . simvastatin (ZOCOR) 40 MG tablet TAKE 1 TABLET BY MOUTH ONCE DAILY.  Marland Kitchen zolpidem (AMBIEN CR) 12.5 MG CR tablet AMBIEN CR, 12.5MG (Oral Tablet Extended Release)  1 PO QHS for 0 days  Quantity: 30.00;  Refills: 0   Ordered :15-Jul-2010  Reginia Forts MD;  Started 03-Mar-2007 Active  . [DISCONTINUED] furosemide (LASIX) 40 MG tablet Take 1 tablet (40 mg total) by mouth 2 (two) times daily.     Allergies:   Other   Social History   Socioeconomic History  . Marital status: Single  Spouse name: Not on file  . Number of children: 1  . Years of education: Not on file  . Highest education level: Some college, no degree  Occupational History  . Occupation: disability/retired  Tobacco Use  . Smoking status: Never Smoker  . Smokeless tobacco: Never Used  Vaping Use  . Vaping Use: Never used  Substance and Sexual Activity  . Alcohol use: No    Alcohol/week: 0.0 standard drinks  . Drug use: No  . Sexual activity: Not on file  Other Topics Concern  . Not on file  Social History Narrative  . Not on file   Social Determinants of Health   Financial Resource Strain:   . Difficulty of Paying Living Expenses: Not on file  Food Insecurity:   . Worried About Charity fundraiser in the Last Year: Not on file  . Ran Out of Food in  the Last Year: Not on file  Transportation Needs:   . Lack of Transportation (Medical): Not on file  . Lack of Transportation (Non-Medical): Not on file  Physical Activity:   . Days of Exercise per Week: Not on file  . Minutes of Exercise per Session: Not on file  Stress:   . Feeling of Stress : Not on file  Social Connections:   . Frequency of Communication with Friends and Family: Not on file  . Frequency of Social Gatherings with Friends and Family: Not on file  . Attends Religious Services: Not on file  . Active Member of Clubs or Organizations: Not on file  . Attends Archivist Meetings: Not on file  . Marital Status: Not on file     Family History: The patient's family history includes Diabetes in his mother and sister.  ROS:   Please see the history of present illness.     All other systems reviewed and are negative.  EKGs/Labs/Other Studies Reviewed:    The following studies were reviewed today:   EKG:  EKG not  ordered today.    Recent Labs: 12/07/2019: ALT 9; Hemoglobin 12.3; Platelets 197 01/30/2020: BUN 17; Creatinine, Ser 1.01; Potassium 4.4; Sodium 139  Recent Lipid Panel    Component Value Date/Time   CHOL 140 09/25/2019 1509   TRIG 106 09/25/2019 1509   HDL 46 09/25/2019 1509   CHOLHDL 3.0 09/25/2019 1509   LDLCALC 75 09/25/2019 1509    Physical Exam:    VS:  BP 102/72 (BP Location: Left Arm, Patient Position: Sitting, Cuff Size: Large)   Pulse 88   Ht 6' (1.829 m)   Wt (!) 352 lb (159.7 kg)   SpO2 93%   BMI 47.74 kg/m     Wt Readings from Last 3 Encounters:  02/22/20 (!) 352 lb (159.7 kg)  01/30/20 (!) 349 lb (158.3 kg)  01/23/20 (!) 350 lb 12.8 oz (159.1 kg)     GEN:  Well nourished, well developed in no acute distress HEENT: Normal NECK: No JVD; No carotid bruits LYMPHATICS: No lymphadenopathy CARDIAC: Irregular irregular, non tachycardic, no murmurs, rubs, gallops RESPIRATORY:  Clear to auscultation without rales, wheezing  or rhonchi  ABDOMEN: Soft, non-tender, non-distended MUSCULOSKELETAL:  1+ edema; No deformity  SKIN: Warm and dry NEUROLOGIC:  Alert and oriented x 3 PSYCHIATRIC:  Normal affect   ASSESSMENT:    1. Atrial fibrillation, unspecified type ([place])   2. HFrEF (heart failure with reduced ejection fraction) ([place])   3. Essential hypertension    PLAN:    In order of problems  listed above:  1. Patient persistent atrial fibrillation, heart rate controlled.  CHA2DS2-VASc score at 3 (chf,htn. DM).  Continue toprol xl 157m qd, Eliquis 540mtwice daily.   2. Patient with HFrEF, EF 30-35%.  NYHA class III symptoms. Cont Toprol-XL 10073mcontinue lisinopril 10 mg daily.  Stop Lasix, start torsemide 40 mg daily. Left heart cath when euvolemic.  Patient tolerating [place] normal blood pressures preventing addition of Aldactone at this point.  Consider adding once euvolemic. 3. Patient with history of hypertension.  Blood pressure controlled.  Continue Toprol-XL, lisinopril, torsemide  Follow-up in 1 month  Total encounter time 40  minutes  Greater than 50% was spent in counseling and coordination of care with the patient   This note was generated in part or whole with voice recognition software. Voice recognition is usually quite accurate but there are transcription errors that can and very often do occur. I apologize for any typographical errors that were not detected and corrected.  Medication Adjustments/Labs and Tests Ordered: Current medicines are reviewed at length with the patient today.  Concerns regarding medicines are outlined above.  No orders of the defined types were placed in this encounter.  Meds ordered this encounter  Medications  . torsemide (DEMADEX) 20 MG tablet    Sig: Take 2 tablets (40 mg total) by mouth daily.    Dispense:  180 tablet    Refill:  3    Patient Instructions  Medication Instructions:  Your physician has recommended you make the following change in  your medication:   1.  STOP taking your Lasix. 2.  START taking torsemide (DEMADEX) 20 MG tablet: Take 2 tablets (40 mg total) by mouth daily.   *If you need a refill on your cardiac medications before your next appointment, please call your pharmacy*   Lab Work: None Ordered If you have labs (blood work) drawn today and your tests are completely normal, you will receive your results only by: . MMarland KitchenChart Message (if you have MyChart) OR . A paper copy in the mail If you have any lab test that is abnormal or we need to change your treatment, we will call you to review the results.   Testing/Procedures: None Ordered   Follow-Up: [facility] and your [facility] needs are our priority.  As part of our continuing mission to provide you with exceptional heart care, we have created designated Provider Care Teams.  These Care Teams include your primary Cardiologist (physician) and Advanced Practice Providers (APPs -  Physician Assistants and Nurse Practitioners) who all work together to provide you with the care you need, when you need it.  We recommend signing up for the patient portal called "MyChart".  Sign up information is provided on this After Visit Summary.  MyChart is used to connect with patients for Virtual Visits (Telemedicine).  Patients are able to view lab/test results, encounter notes, upcoming appointments, etc.  Non-urgent messages can be sent to your provider as well.   To learn more about what you can do with MyChart, go to [email or web address]  Your next appointment:   1 month(s)  The format for your next appointment:   In Person  Provider:   BriKate SableD   Other Instructions       Signed, BriKate SableD  02/22/2020 12:55 PM    [place]

## 2020-02-22 NOTE — Patient Instructions (Signed)
Medication Instructions:  Your physician has recommended you make the following change in your medication:   1.  STOP taking your Lasix. 2.  START taking torsemide (DEMADEX) 20 MG tablet: Take 2 tablets (40 mg total) by mouth daily.   *If you need a refill on your cardiac medications before your next appointment, please call your pharmacy*   Lab Work: None Ordered If you have labs (blood work) drawn today and your tests are completely normal, you will receive your results only by: Marland Kitchen MyChart Message (if you have MyChart) OR . A paper copy in the mail If you have any lab test that is abnormal or we need to change your treatment, we will call you to review the results.   Testing/Procedures: None Ordered   Follow-Up: At [facility], you and your health needs are our priority.  As part of our continuing mission to provide you with exceptional heart care, we have created designated Provider Care Teams.  These Care Teams include your primary Cardiologist (physician) and Advanced Practice Providers (APPs -  Physician Assistants and Nurse Practitioners) who all work together to provide you with the care you need, when you need it.  We recommend signing up for the patient portal called "MyChart".  Sign up information is provided on this After Visit Summary.  MyChart is used to connect with patients for Virtual Visits (Telemedicine).  Patients are able to view lab/test results, encounter notes, upcoming appointments, etc.  Non-urgent messages can be sent to your provider as well.   To learn more about what you can do with MyChart, go to [email or web address].    Your next appointment:   1 month(s)  The format for your next appointment:   In Person  Provider:   Debbe Odea, MD   Other Instructions

## 2020-02-23 ENCOUNTER — Telehealth: Payer: Self-pay

## 2020-02-23 ENCOUNTER — Telehealth (INDEPENDENT_AMBULATORY_CARE_PROVIDER_SITE_OTHER): Payer: Medicare Other | Admitting: Pulmonary Disease

## 2020-02-23 DIAGNOSIS — G4733 Obstructive sleep apnea (adult) (pediatric): Secondary | ICD-10-CM

## 2020-02-23 NOTE — Telephone Encounter (Signed)
PSG showed moderate OSA, AHI 25/hour.  Suggest auto CPAP, mask of choice, 5 to 15 cm

## 2020-02-23 NOTE — Telephone Encounter (Signed)
Copied from CRM [id number]. Topic: General - Call Back - No Documentation >> Feb 22, 2020  3:46 PM Marylen Ponto wrote: Reason for CRM: Pt stated he was returning call to Maralyn Sago. Pt requests call back

## 2020-02-24 ENCOUNTER — Other Ambulatory Visit: Payer: Self-pay

## 2020-02-28 NOTE — Telephone Encounter (Signed)
I returned pts call and had to leave another message for him to return call

## 2020-03-04 ENCOUNTER — Other Ambulatory Visit: Payer: Self-pay

## 2020-03-04 ENCOUNTER — Ambulatory Visit: Payer: Medicare Other | Admitting: Adult Health

## 2020-03-06 NOTE — Telephone Encounter (Signed)
Please let patient know that sleep study showed sleep apnea. Please set up virtual/televisit to discuss results in next week or so .

## 2020-03-06 NOTE — Telephone Encounter (Signed)
Called and left message for patient to return call.  

## 2020-03-07 DIAGNOSIS — G4733 Obstructive sleep apnea (adult) (pediatric): Secondary | ICD-10-CM

## 2020-03-07 NOTE — Telephone Encounter (Signed)
Contacted patient with results of home sleep study. Notes from Dr. Vassie Loll reviewed. Follow up appointment made to discuss results and next steps made. Provider requested a video or telephone visit. Patient prefers a tele visit. Appointment made for 03/11/2020

## 2020-03-11 ENCOUNTER — Ambulatory Visit (INDEPENDENT_AMBULATORY_CARE_PROVIDER_SITE_OTHER): Payer: Medicare Other | Admitting: Adult Health

## 2020-03-11 ENCOUNTER — Other Ambulatory Visit: Payer: Self-pay

## 2020-03-11 ENCOUNTER — Other Ambulatory Visit: Payer: Self-pay | Admitting: Family Medicine

## 2020-03-11 ENCOUNTER — Encounter: Payer: Self-pay | Admitting: Family Medicine

## 2020-03-11 ENCOUNTER — Encounter: Payer: Self-pay | Admitting: Adult Health

## 2020-03-11 DIAGNOSIS — G4733 Obstructive sleep apnea (adult) (pediatric): Secondary | ICD-10-CM | POA: Insufficient documentation

## 2020-03-11 DIAGNOSIS — Z9989 Dependence on other enabling machines and devices: Secondary | ICD-10-CM | POA: Insufficient documentation

## 2020-03-11 DIAGNOSIS — Z794 Long term (current) use of insulin: Secondary | ICD-10-CM

## 2020-03-11 NOTE — Telephone Encounter (Signed)
Please advise if OK to dispense samples if available.

## 2020-03-11 NOTE — Telephone Encounter (Signed)
He can have a month's supply if we have them 

## 2020-03-11 NOTE — Progress Notes (Signed)
Reviewed and agree with assessment/plan.   Esmee Fallaw, MD [facility] 03/11/2020, 5:17 PM Pager:  [phone]  

## 2020-03-11 NOTE — Progress Notes (Signed)
Virtual Visit via Telephone Note  I connected with Taylor [name] on 03/11/20 at  4:30 PM EDT by telephone and verified that I am speaking with the correct person using two identifiers.  Location: Patient: Home  Provider: Office    I discussed the limitations, risks, security and privacy concerns of performing an evaluation and management service by telephone and the availability of in person appointments. I also discussed with the patient that there may be a patient responsible charge related to this service. The patient expressed understanding and agreed to proceed.   History of Present Illness: 64 year old male never smoker seen for sleep consult January 23, 2020 for daytime sleepiness snoring and restless sleep found to have moderate obstructive sleep apnea New onset A. fib and congestive heart failure diagnosed in May 2021 Medical history significant for diabetes, hypertension, chronic back pain on narcotics  Today's telemedicine visit is a 6-week follow-up for sleep apnea.  Patient was seen for sleep consult January 23, 2020 for daytime sleepiness and snoring.  Patient was set up for a sleep study that showed moderate sleep apnea with AHI at 25/hour.  We discussed his sleep study results.  Went over sleep apnea and potential complications of untreated sleep apnea.  Due to patient's symptom burden along with underlying A. fib and congestive heart failure.  Patient would like to proceed with CPAP at bedtime.  Patient education on nocturnal CPAP.  Past Medical History:  Diagnosis Date  . Arthritis   . Broken leg 02/07/2019  . Depression   . Diabetes mellitus without complication ([place])   . Hypertension    Current Outpatient Medications on File Prior to Visit  Medication Sig Dispense Refill  . apixaban (ELIQUIS) 5 MG TABS tablet Take 1 tablet (5 mg total) by mouth 2 (two) times daily. 60 tablet 6  . dapagliflozin propanediol (FARXIGA) 10 MG TABS tablet Take 1 tablet (10 mg total) by  mouth daily before breakfast. 35 tablet 0  . glucose blood (ONETOUCH ULTRA) test strip Use to check sugar daily for type 2 diabetes E11.9 100 each 4  . HYDROmorphone (DILAUDID) 4 MG tablet Take 4 mg by mouth every 6 (six) hours as needed (for break through pain).     Marland Kitchen lisinopril (ZESTRIL) 10 MG tablet TAKE 1 TABLET BY MOUTH ONCE DAILY. 90 tablet 3  . meloxicam (MOBIC) 7.5 MG tablet Take 7.5 mg by mouth 2 (two) times daily.  2  . metoprolol succinate (TOPROL-XL) 100 MG 24 hr tablet Take 1 tablet (100 mg total) by mouth daily. Take with or immediately following a meal. 30 tablet 6  . NARCAN 4 MG/0.1ML LIQD nasal spray kit     . OXYCONTIN 20 MG 12 hr tablet Take 20 mg by mouth 2 (two) times daily.    . OXYCONTIN 30 MG 12 hr tablet Take 30 mg by mouth 4 (four) times daily as needed.     . pioglitazone (ACTOS) 45 MG tablet TAKE 1 TABLET BY MOUTH ONCE DAILY. 30 tablet 11  . potassium chloride SA (KLOR-CON) 20 MEQ tablet Take 1 tablet (20 mEq total) by mouth daily. 30 tablet 5  . pregabalin (LYRICA) 100 MG capsule Take 100 mg by mouth 3 (three) times daily.     . simvastatin (ZOCOR) 40 MG tablet TAKE 1 TABLET BY MOUTH ONCE DAILY. 90 tablet 3  . torsemide (DEMADEX) 20 MG tablet Take 2 tablets (40 mg total) by mouth daily. 180 tablet 3  . zolpidem (AMBIEN CR) 12.5 MG  CR tablet AMBIEN CR, 12.5MG (Oral Tablet Extended Release)  1 PO QHS for 0 days  Quantity: 30.00;  Refills: 0   Ordered :15-Jul-2010  Reginia Forts MD;  Started 03-Mar-2007 Active     No current facility-administered medications on file prior to visit.       Observations/Objective:  02/23/20 PSG showed moderate OSA, AHI 25/hour.  2D echo July 2021 EF 30 to 35%, right atrium moderately dilated    Assessment and Plan: Moderate obstructive sleep apnea-patient has significant symptom burden along with underlying congestive heart failure and A. Fib Patient will begin CPAP-AutoSet 5 to 15 cm H2O.,  Mask of choice.  Morbid  obesity-encouraged on weight loss.  Plan  Patient Instructions  Begin CPAP at bedtime Try to wear your CPAP all night long for at least 6 hours or more Do not drive if sleepy Work on healthy weight Avoid sedating medications as able Follow-up in the office in 2-3 months with Dr. Halford Chessman  Or Blanchie Zeleznik NP     Follow Up Instructions: Patient is to follow-up in 2 to 3 months and as needed  I discussed the assessment and treatment plan with the patient. The patient was provided an opportunity to ask questions and all were answered. The patient agreed with the plan and demonstrated an understanding of the instructions.   The patient was advised to call back or seek an in-person evaluation if the symptoms worsen or if the condition fails to improve as anticipated.  I provided 22 minutes of non-face-to-face time during this encounter.   Rexene Edison, NP

## 2020-03-11 NOTE — Telephone Encounter (Signed)
Pt wants to know if Dr Sherrie Mustache will give him more samples of the  dapagliflozin propanediol (FARXIGA) 10 MG TABS tablet

## 2020-03-11 NOTE — Patient Instructions (Addendum)
Begin CPAP at bedtime Try to wear your CPAP all night long for at least 6 hours or more Do not drive if sleepy Work on healthy weight Avoid sedating medications as able Follow-up in the office in 2-3 months with Dr. Craige Cotta  Or Zayonna Ayuso NP

## 2020-03-12 MED ORDER — DAPAGLIFLOZIN PROPANEDIOL 10 MG PO TABS: 10.0000 mg | ORAL_TABLET | Freq: Every day | ORAL | 0 refills | Status: DC

## 2020-03-12 NOTE — Telephone Encounter (Signed)
Samples left up front for pick up. Patient advised.  

## 2020-03-14 ENCOUNTER — Other Ambulatory Visit: Payer: Self-pay

## 2020-03-14 DIAGNOSIS — Z1211 Encounter for screening for malignant neoplasm of colon: Secondary | ICD-10-CM

## 2020-03-14 LAB — IFOBT (OCCULT BLOOD): IFOBT: NEGATIVE

## 2020-03-14 NOTE — Progress Notes (Signed)
Patient returned OC light test. Result was negative. Please review.

## 2020-03-14 NOTE — Addendum Note (Signed)
Addended by: Benjiman Core on: 03/14/2020 10:19 AM   Modules accepted: Orders

## 2020-03-15 ENCOUNTER — Telehealth: Payer: Self-pay | Admitting: *Deleted

## 2020-03-15 NOTE — Chronic Care Management (AMB) (Signed)
  Chronic Care Management   Note  03/15/2020 Name: Taylor [name] MRN: [id number] DOB: 1956/03/17  Taylor [name] is a 65 y.o. year old male who is a primary care patient of Caryn Section, Kirstie Peri, MD. I reached out to Taylor [name] by phone today in response to a referral sent by Taylor [name]'s health plan.     Taylor [name] was given information about Chronic Care Management services today including:  1. CCM service includes personalized support from designated clinical staff supervised by his physician, including individualized plan of care and coordination with other care providers 2. 24/7 contact phone numbers for assistance for urgent and routine care needs. 3. Service will only be billed when office clinical staff spend 20 minutes or more in a month to coordinate care. 4. Only one practitioner may furnish and bill the service in a calendar month. 5. The patient may stop CCM services at any time (effective at the end of the month) by phone call to the office staff. 6. The patient will be responsible for cost sharing (co-pay) of up to 20% of the service fee (after annual deductible is met).  Patient agreed to services and verbal consent obtained.   Follow up plan: Telephone appointment with care management team member scheduled for:03/28/2020  [employer] [facility]  Direct Dial: [phone]

## 2020-03-25 ENCOUNTER — Ambulatory Visit: Payer: Medicare Other | Admitting: Cardiology

## 2020-03-25 ENCOUNTER — Other Ambulatory Visit: Payer: Self-pay | Admitting: Physician Assistant

## 2020-03-25 ENCOUNTER — Encounter: Payer: Self-pay | Admitting: Cardiology

## 2020-03-25 ENCOUNTER — Other Ambulatory Visit: Payer: Self-pay

## 2020-03-25 VITALS — BP 120/88 | HR 107 | Ht 72.0 in | Wt 355.0 lb

## 2020-03-25 DIAGNOSIS — I5020 Unspecified systolic (congestive) heart failure: Secondary | ICD-10-CM

## 2020-03-25 DIAGNOSIS — H903 Sensorineural hearing loss, bilateral: Secondary | ICD-10-CM | POA: Diagnosis not present

## 2020-03-25 DIAGNOSIS — I10 Essential (primary) hypertension: Secondary | ICD-10-CM | POA: Diagnosis not present

## 2020-03-25 DIAGNOSIS — I4891 Unspecified atrial fibrillation: Secondary | ICD-10-CM | POA: Diagnosis not present

## 2020-03-25 DIAGNOSIS — H90A22 Sensorineural hearing loss, unilateral, left ear, with restricted hearing on the contralateral side: Secondary | ICD-10-CM | POA: Diagnosis not present

## 2020-03-25 NOTE — H&P (View-Only) (Signed)
Cardiology Office Note:    Date:  03/25/2020   ID:  Taylor [name], DOB January 14, 1956, MRN [id number]  PCP:  Birdie Sons, MD  Cardiologist:  Kate Sable, MD  Electrophysiologist:  None   Referring MD: Birdie Sons, MD   Chief Complaint  Patient presents with  . Follow-up    1 month  Pt states no more SOB, no new Sx.    History of Present Illness:    Taylor [name] is a 64 y.o. male with a hx of hypertension, diabetes, atrial fibrillation, heart failure reduced ejection fraction, EF 30 to 35% who presents for follow-up.    Patient is being seen for atrial fibrillation, heart failure reduced ejection fraction.  He is tolerating Toprol-XL 100 mg daily.  Started on torsemide due to edema after last visit.  Lower extremity edema and shortness of breath has improved.  Has no new concerns at this time.  Prior notes Echocardiogram 12/2019 showed moderate to severely reduced EF, EF 30 to 35%.   Past Medical History:  Diagnosis Date  . Arthritis   . Broken leg 02/07/2019  . Depression   . Diabetes mellitus without complication ([place])   . Hypertension     Past Surgical History:  Procedure Laterality Date  . BACK SURGERY      Current Medications: Current Meds  Medication Sig  . apixaban (ELIQUIS) 5 MG TABS tablet Take 1 tablet (5 mg total) by mouth 2 (two) times daily.  . dapagliflozin propanediol (FARXIGA) 10 MG TABS tablet Take 1 tablet (10 mg total) by mouth daily before breakfast.  . glipiZIDE (GLUCOTROL XL) 10 MG 24 hr tablet TAKE 1 TABLET BY MOUTH ONCE DAILY.  Marland Kitchen glucose blood (ONETOUCH ULTRA) test strip Use to check sugar daily for type 2 diabetes E11.9  . HYDROmorphone (DILAUDID) 4 MG tablet Take 4 mg by mouth every 6 (six) hours as needed (for break through pain).   Marland Kitchen lisinopril (ZESTRIL) 10 MG tablet TAKE 1 TABLET BY MOUTH ONCE DAILY.  . meloxicam (MOBIC) 7.5 MG tablet Take 7.5 mg by mouth 2 (two) times daily.  . metFORMIN (GLUCOPHAGE) 1000 MG  tablet TAKE 1 TABLET BY MOUTH TWICE DAILY  . NARCAN 4 MG/0.1ML LIQD nasal spray kit   . OXYCONTIN 30 MG 12 hr tablet Take 30 mg by mouth 4 (four) times daily as needed.   . pioglitazone (ACTOS) 45 MG tablet TAKE 1 TABLET BY MOUTH ONCE DAILY.  Marland Kitchen potassium chloride SA (KLOR-CON) 20 MEQ tablet Take 1 tablet (20 mEq total) by mouth daily.  . pregabalin (LYRICA) 100 MG capsule Take 100 mg by mouth 3 (three) times daily.   . simvastatin (ZOCOR) 40 MG tablet TAKE 1 TABLET BY MOUTH ONCE DAILY.  Marland Kitchen torsemide (DEMADEX) 20 MG tablet Take 2 tablets (40 mg total) by mouth daily.  Marland Kitchen zolpidem (AMBIEN CR) 12.5 MG CR tablet AMBIEN CR, 12.5MG (Oral Tablet Extended Release)  1 PO QHS for 0 days  Quantity: 30.00;  Refills: 0   Ordered :15-Jul-2010  Reginia Forts MD;  Started 03-Mar-2007 Active     Allergies:   Other   Social History   Socioeconomic History  . Marital status: Single    Spouse name: Not on file  . Number of children: 1  . Years of education: Not on file  . Highest education level: Some college, no degree  Occupational History  . Occupation: disability/retired  Tobacco Use  . Smoking status: Never Smoker  . Smokeless tobacco: Never  Used  Vaping Use  . Vaping Use: Never used  Substance and Sexual Activity  . Alcohol use: No    Alcohol/week: 0.0 standard drinks  . Drug use: No  . Sexual activity: Not on file  Other Topics Concern  . Not on file  Social History Narrative  . Not on file   Social Determinants of Health   Financial Resource Strain:   . Difficulty of Paying Living Expenses: Not on file  Food Insecurity:   . Worried About Charity fundraiser in the Last Year: Not on file  . Ran Out of Food in the Last Year: Not on file  Transportation Needs:   . Lack of Transportation (Medical): Not on file  . Lack of Transportation (Non-Medical): Not on file  Physical Activity:   . Days of Exercise per Week: Not on file  . Minutes of Exercise per Session: Not on file    Stress:   . Feeling of Stress : Not on file  Social Connections:   . Frequency of Communication with Friends and Family: Not on file  . Frequency of Social Gatherings with Friends and Family: Not on file  . Attends Religious Services: Not on file  . Active Member of Clubs or Organizations: Not on file  . Attends Archivist Meetings: Not on file  . Marital Status: Not on file     Family History: The patient's family history includes Diabetes in his mother and sister.  ROS:   Please see the history of present illness.     All other systems reviewed and are negative.  EKGs/Labs/Other Studies Reviewed:    The following studies were reviewed today:   EKG:  EKG is  ordered today.  EKG shows atrial fibrillation, occasional PVCs, heart rate one oh seven  Recent Labs: 12/07/2019: ALT 9; Hemoglobin 12.3; Platelets 197 01/30/2020: BUN 17; Creatinine, Ser 1.01; Potassium 4.4; Sodium 139  Recent Lipid Panel    Component Value Date/Time   CHOL 140 09/25/2019 1509   TRIG 106 09/25/2019 1509   HDL 46 09/25/2019 1509   CHOLHDL 3.0 09/25/2019 1509   LDLCALC 75 09/25/2019 1509    Physical Exam:    VS:  BP 120/88   Pulse (!) 107   Ht 6' (1.829 m)   Wt (!) 355 lb (161 kg)   BMI 48.15 kg/m     Wt Readings from Last 3 Encounters:  03/25/20 (!) 355 lb (161 kg)  02/22/20 (!) 352 lb (159.7 kg)  01/30/20 (!) 349 lb (158.3 kg)     GEN:  Well nourished, well developed in no acute distress HEENT: Normal NECK: No JVD; No carotid bruits LYMPHATICS: No lymphadenopathy CARDIAC: Irregular irregular, non tachycardic, no murmurs, rubs, gallops RESPIRATORY:  Clear to auscultation without rales, wheezing or rhonchi  ABDOMEN: Soft, non-tender, non-distended MUSCULOSKELETAL:  trace edema; No deformity  SKIN: Warm and dry NEUROLOGIC:  Alert and oriented x 3 PSYCHIATRIC:  Normal affect   ASSESSMENT:    1. Atrial fibrillation, unspecified type ([place])   2. HFrEF (heart failure with  reduced ejection fraction) ([place])   3. Essential hypertension    PLAN:    In order of problems listed above:  1. Patient persistent atrial fibrillation, heart rate controlled.  CHA2DS2-VASc score at 3 (chf,htn. DM).  Continue toprol xl 173m qd, Eliquis 54mtwice daily.   2. Patient with HFrEF, EF 30-35%.  NYHA class II-III symptoms. Cont Toprol-XL 10056mcontinue lisinopril 10 mg daily, torsemide 40 mg daily,  Farxiga 10 mg daily.  Plan for left heart cath to evaluate ischemia as cause for reduced ejection fraction. 3. Patient with history of hypertension.  BP controlled.  Torsemide, lisinopril, Toprol-XL.  Follow-up after left heart cath.  Total encounter time 40  minutes  Greater than 50% was spent in counseling and coordination of care with the patient   This note was generated in part or whole with voice recognition software. Voice recognition is usually quite accurate but there are transcription errors that can and very often do occur. I apologize for any typographical errors that were not detected and corrected.  Medication Adjustments/Labs and Tests Ordered: Current medicines are reviewed at length with the patient today.  Concerns regarding medicines are outlined above.  Orders Placed This Encounter  Procedures  . Basic metabolic panel  . CBC with Differential/Platelet  . EKG 12-Lead   No orders of the defined types were placed in this encounter.   Patient Instructions  Medication Instructions:  Your physician recommends that you continue on your current medications as directed. Please refer to the Current Medication list given to you today.  *If you need a refill on your cardiac medications before your next appointment, please call your pharmacy*   Lab Work: 1-  Your physician recommends that you return for lab work in: [place], CBC.   2-  COVID PRE- TEST: You will need a COVID TEST prior to the procedure:  LOCATION: [address]  site.  DATE/TIME:  Wednesday, April 03, 2021 sometime between 8 am and 1 pm.  If you have labs (blood work) drawn today and your tests are completely normal, you will receive your results only by: Marland Kitchen MyChart Message (if you have MyChart) OR . A paper copy in the mail If you have any lab test that is abnormal or we need to change your treatment, we will call you to review the results.   Testing/Procedures:  Your physician has requested that you have a cardiac catheterization. Cardiac catheterization is used to diagnose and/or treat various heart conditions. Doctors may recommend this procedure for a number of different reasons. The most common reason is to evaluate chest pain. Chest pain can be a symptom of coronary artery disease (CAD), and cardiac catheterization can show whether plaque is narrowing or blocking your heart's arteries. This procedure is also used to evaluate the valves, as well as measure the blood flow and oxygen levels in different parts of your heart. For further information please visit [email or web address]. Please follow instruction sheet, as given.   You are scheduled for a LEFT Cardiac Catheterization on Friday, October 29 with Dr. Kathlyn [place].  1. Please arrive at the [facility] at 6:30 AM (This time is two hours before your procedure to ensure your preparation). Free valet parking service is available.   Special note: Every effort is made to have your procedure done on time. Please understand that emergencies sometimes delay scheduled procedures.  2. Diet: Do not eat solid foods after midnight.  The patient may have clear liquids until 5am upon the day of the procedure.  3. Labs: You will need to have blood drawn on TODAY - BMET, CBC.   COVID PRE- TEST: You will need a COVID TEST prior to the procedure:  LOCATION: [address] Testing site.  DATE/TIME:  Wednesday, March 29, 2021 sometime between 8 am and 1 pm.  4. Medication  instructions in preparation for your procedure:  Contrast Allergy: No  Stop taking Eliquis (Apixiban) on Wednesday, October 27.  Stop taking, Torsemide (Demadex) Friday, October 29,   Do not take Diabetes Med (Metformin, Glipizide, Actos) on the day of the procedure and HOLD 48 HOURS AFTER THE PROCEDURE.  On the morning of your procedure, take your Aspirin and any morning medicines NOT listed above.  You may use sips of water.  5. Plan for one night stay--bring personal belongings. 6. Bring a current list of your medications and current insurance cards. 7. You MUST have a responsible person to drive you home. 8. Someone MUST be with you the first 24 hours after you arrive home or your discharge will be delayed. 9. Please wear clothes that are easy to get on and off and wear slip-on shoes.  Thank you for allowing [place] to care for you!   -- [facility]    Follow-Up: At [facility], you and your health needs are our priority.  As part of our continuing mission to provide you with exceptional heart care, we have created designated Provider Care Teams.  These Care Teams include your primary Cardiologist (physician) and Advanced Practice Providers (APPs -  Physician Assistants and Nurse Practitioners) who all work together to provide you with the care you need, when you need it.  We recommend signing up for the patient portal called "MyChart".  Sign up information is provided on this After Visit Summary.  MyChart is used to connect with patients for Virtual Visits (Telemedicine).  Patients are able to view lab/test results, encounter notes, upcoming appointments, etc.  Non-urgent messages can be sent to your provider as well.   To learn more about what you can do with MyChart, go to [email or web address].    Your next appointment:   1 week after cath  The format for your next appointment:   In Person  Provider:   Kate Sable,  MD     Coronary Angiogram With Stent Coronary angiogram with stent placement is a procedure to widen or open a narrow blood vessel of the heart (coronary artery). Arteries may become blocked by cholesterol buildup (plaques) in the lining of the artery wall. When a coronary artery becomes partially blocked, blood flow to that area decreases. This may lead to chest pain or a heart attack (myocardial infarction). A stent is a small piece of metal that looks like mesh or spring. Stent placement may be done as treatment after a heart attack, or to prevent a heart attack if a blocked artery is found by a coronary angiogram. Let your health care provider know about:  Any allergies you have, including allergies to medicines or contrast dye.  All medicines you are taking, including vitamins, herbs, eye drops, creams, and over-the-counter medicines.  Any problems you or family members have had with anesthetic medicines.  Any blood disorders you have.  Any surgeries you have had.  Any medical conditions you have, including kidney problems or kidney failure.  Whether you are pregnant or may be pregnant.  Whether you are breastfeeding. What are the risks? Generally, this is a safe procedure. However, serious problems may occur, including:  Damage to nearby structures or organs, such as the heart, blood vessels, or kidneys.  A return of blockage.  Bleeding, infection, or bruising at the insertion site.  A collection of blood under the skin (hematoma) at the insertion site.  A blood clot in another part of the body.  Allergic reaction to medicines or dyes.  Bleeding into the abdomen (retroperitoneal bleeding).  Stroke (rare).  Heart attack (rare). What happens before the procedure? Staying hydrated Follow instructions from your health care provider about hydration, which may include:  Up to 2 hours before the procedure - you may continue to drink clear liquids, such as water, clear  fruit juice, black coffee, and plain tea.  Eating and drinking restrictions Follow instructions from your health care provider about eating and drinking, which may include:  8 hours before the procedure - stop eating heavy meals or foods, such as meat, fried foods, or fatty foods.  6 hours before the procedure - stop eating light meals or foods, such as toast or cereal.  2 hours before the procedure - stop drinking clear liquids. Medicines Ask your health care provider about:  Changing or stopping your regular medicines. This is especially important if you are taking diabetes medicines or blood thinners.  Taking medicines such as aspirin and ibuprofen. These medicines can thin your blood. Do not take these medicines unless your health care provider tells you to take them. ? Generally, aspirin is recommended before a thin tube, called a catheter, is passed through a blood vessel and inserted into the heart (cardiac catheterization).  Taking over-the-counter medicines, vitamins, herbs, and supplements. General instructions  Do not use any products that contain nicotine or tobacco for at least 4 weeks before the procedure. These products include cigarettes, e-cigarettes, and chewing tobacco. If you need help quitting, ask your health care provider.  Plan to have someone take you home from the hospital or clinic.  If you will be going home right after the procedure, plan to have someone with you for 24 hours.  You may have tests and imaging procedures.  Ask your health care provider: ? How your insertion site will be marked. Ask which artery will be used for the procedure. ? What steps will be taken to help prevent infection. These may include:  Removing hair at the insertion site.  Washing skin with a germ-killing soap.  Taking antibiotic medicine. What happens during the procedure?   An IV will be inserted into one of your veins.  Electrodes may be placed on your chest to  monitor your heart rate during the procedure.  You will be given one or more of the following: ? A medicine to help you relax (sedative). ? A medicine to numb the area (local anesthetic) for catheter insertion.  A small incision will be made for catheter insertion.  The catheter will be inserted into an artery using a guide wire. The location may be in your groin, your wrist, or the fold of your arm (near your elbow).  An X-ray procedure (fluoroscopy) will be used to help guide the catheter to the opening of the heart arteries.  A dye will be injected into the catheter. X-rays will be taken. The dye helps to show where any narrowing or blockages are located in the arteries.  Tell your health care provider if you have chest pain or trouble breathing.  A tiny wire will be guided to the blocked spot, and a balloon will be inflated to make the artery wider.  The stent will be expanded to crush the plaques into the wall of the vessel. The stent will hold the area open and improve the blood flow. Most stents have a drug coating to reduce the risk of the stent narrowing over time.  The artery may be made wider using a drill, laser, or other tools  that remove plaques.  The catheter will be removed when the blood flow improves. The stent will stay where it was placed, and the lining of the artery will grow over it.  A bandage (dressing) will be placed on the insertion site. Pressure will be applied to stop bleeding.  The IV will be removed. This procedure may vary among health care providers and hospitals. What happens after the procedure?  Your blood pressure, heart rate, breathing rate, and blood oxygen level will be monitored until you leave the hospital or clinic.  If the procedure is done through the leg, you will lie flat in bed for a few hours or for as long as told by your health care provider. You will be instructed not to bend or cross your legs.  The insertion site and the pulse  in your foot or wrist will be checked often.  You may have more blood tests, X-rays, and a test that records the electrical activity of your heart (electrocardiogram, or ECG).  Do not drive for 24 hours if you were given a sedative during your procedure. Summary  Coronary angiogram with stent placement is a procedure to widen or open a narrowed coronary artery. This is done to treat heart problems.  Before the procedure, let your health care provider know about all the medical conditions and surgeries you have or have had.  This is a safe procedure. However, some problems may occur, including damage to nearby structures or organs, bleeding, blood clots, or allergies.  Follow your health care provider's instructions about eating, drinking, medicines, and other lifestyle changes, such as quitting tobacco use before the procedure. This information is not intended to replace advice given to you by your health care provider. Make sure you discuss any questions you have with your health care provider. Document Revised: 12/14/2018 Document Reviewed: 12/14/2018 Elsevier Patient Education  2020 [place], Kate Sable, MD  03/25/2020 1:09 PM    [place]

## 2020-03-25 NOTE — Progress Notes (Signed)
Cardiology Office Note:    Date:  03/25/2020   ID:  Taylor [name], DOB May 17, 1956, MRN [id number]  PCP:  Birdie Sons, MD  Cardiologist:  Kate Sable, MD  Electrophysiologist:  None   Referring MD: Birdie Sons, MD   Chief Complaint  Patient presents with   Follow-up    1 month    Pt states no more SOB, no new Sx.    History of Present Illness:    Taylor [name] is a 64 y.o. male with a hx of hypertension, diabetes, atrial fibrillation, heart failure reduced ejection fraction, EF 30 to 35% who presents for follow-up.    Patient is being seen for atrial fibrillation, heart failure reduced ejection fraction.  He is tolerating Toprol-XL 100 mg daily.  Started on torsemide due to edema after last visit.  Lower extremity edema and shortness of breath has improved.  Has no new concerns at this time.  Prior notes Echocardiogram 12/2019 showed moderate to severely reduced EF, EF 30 to 35%.   Past Medical History:  Diagnosis Date   Arthritis    Broken leg 02/07/2019   Depression    Diabetes mellitus without complication ([place])    Hypertension     Past Surgical History:  Procedure Laterality Date   BACK SURGERY      Current Medications: Current Meds  Medication Sig   apixaban (ELIQUIS) 5 MG TABS tablet Take 1 tablet (5 mg total) by mouth 2 (two) times daily.   dapagliflozin propanediol (FARXIGA) 10 MG TABS tablet Take 1 tablet (10 mg total) by mouth daily before breakfast.   glipiZIDE (GLUCOTROL XL) 10 MG 24 hr tablet TAKE 1 TABLET BY MOUTH ONCE DAILY.   glucose blood (ONETOUCH ULTRA) test strip Use to check sugar daily for type 2 diabetes E11.9   HYDROmorphone (DILAUDID) 4 MG tablet Take 4 mg by mouth every 6 (six) hours as needed (for break through pain).    lisinopril (ZESTRIL) 10 MG tablet TAKE 1 TABLET BY MOUTH ONCE DAILY.   meloxicam (MOBIC) 7.5 MG tablet Take 7.5 mg by mouth 2 (two) times daily.   metFORMIN (GLUCOPHAGE) 1000 MG  tablet TAKE 1 TABLET BY MOUTH TWICE DAILY   NARCAN 4 MG/0.1ML LIQD nasal spray kit    OXYCONTIN 30 MG 12 hr tablet Take 30 mg by mouth 4 (four) times daily as needed.    pioglitazone (ACTOS) 45 MG tablet TAKE 1 TABLET BY MOUTH ONCE DAILY.   potassium chloride SA (KLOR-CON) 20 MEQ tablet Take 1 tablet (20 mEq total) by mouth daily.   pregabalin (LYRICA) 100 MG capsule Take 100 mg by mouth 3 (three) times daily.    simvastatin (ZOCOR) 40 MG tablet TAKE 1 TABLET BY MOUTH ONCE DAILY.   torsemide (DEMADEX) 20 MG tablet Take 2 tablets (40 mg total) by mouth daily.   zolpidem (AMBIEN CR) 12.5 MG CR tablet AMBIEN CR, 12.5MG (Oral Tablet Extended Release)  1 PO QHS for 0 days  Quantity: 30.00;  Refills: 0   Ordered :15-Jul-2010  Reginia Forts MD;  Started 03-Mar-2007 Active     Allergies:   Other   Social History   Socioeconomic History   Marital status: Single    Spouse name: Not on file   Number of children: 1   Years of education: Not on file   Highest education level: Some college, no degree  Occupational History   Occupation: disability/retired  Tobacco Use   Smoking status: Never Smoker   Smokeless  tobacco: Never Used  Vaping Use   Vaping Use: Never used  Substance and Sexual Activity   Alcohol use: No    Alcohol/week: 0.0 standard drinks   Drug use: No   Sexual activity: Not on file  Other Topics Concern   Not on file  Social History Narrative   Not on file   Social Determinants of Health   Financial Resource Strain:    Difficulty of Paying Living Expenses: Not on file  Food Insecurity:    Worried About [place] in the Last Year: Not on file   Ran Out of Food in the Last Year: Not on file  Transportation Needs:    Lack of Transportation (Medical): Not on file   Lack of Transportation (Non-Medical): Not on file  Physical Activity:    Days of Exercise per Week: Not on file   Minutes of Exercise per Session: Not on file    Stress:    Feeling of Stress : Not on file  Social Connections:    Frequency of Communication with Friends and Family: Not on file   Frequency of Social Gatherings with Friends and Family: Not on file   Attends Religious Services: Not on file   Active Member of Clubs or Organizations: Not on file   Attends Archivist Meetings: Not on file   Marital Status: Not on file     Family History: The patient's family history includes Diabetes in his mother and sister.  ROS:   Please see the history of present illness.     All other systems reviewed and are negative.  EKGs/Labs/Other Studies Reviewed:    The following studies were reviewed today:   EKG:  EKG is  ordered today.  EKG shows atrial fibrillation, occasional PVCs, heart rate one oh seven  Recent Labs: 12/07/2019: ALT 9; Hemoglobin 12.3; Platelets 197 01/30/2020: BUN 17; Creatinine, Ser 1.01; Potassium 4.4; Sodium 139  Recent Lipid Panel    Component Value Date/Time   CHOL 140 09/25/2019 1509   TRIG 106 09/25/2019 1509   HDL 46 09/25/2019 1509   CHOLHDL 3.0 09/25/2019 1509   LDLCALC 75 09/25/2019 1509    Physical Exam:    VS:  BP 120/88    Pulse (!) 107    Ht 6' (1.829 m)    Wt (!) 355 lb (161 kg)    BMI 48.15 kg/m     Wt Readings from Last 3 Encounters:  03/25/20 (!) 355 lb (161 kg)  02/22/20 (!) 352 lb (159.7 kg)  01/30/20 (!) 349 lb (158.3 kg)     GEN:  Well nourished, well developed in no acute distress HEENT: Normal NECK: No JVD; No carotid bruits LYMPHATICS: No lymphadenopathy CARDIAC: Irregular irregular, non tachycardic, no murmurs, rubs, gallops RESPIRATORY:  Clear to auscultation without rales, wheezing or rhonchi  ABDOMEN: Soft, non-tender, non-distended MUSCULOSKELETAL:  trace edema; No deformity  SKIN: Warm and dry NEUROLOGIC:  Alert and oriented x 3 PSYCHIATRIC:  Normal affect   ASSESSMENT:    1. Atrial fibrillation, unspecified type ([place])   2. HFrEF (heart failure with  reduced ejection fraction) ([place])   3. Essential hypertension    PLAN:    In order of problems listed above:  1. Patient persistent atrial fibrillation, heart rate controlled.  CHA2DS2-VASc score at 3 (chf,htn. DM).  Continue toprol xl 166m qd, Eliquis 585mtwice daily.   2. Patient with HFrEF, EF 30-35%.  NYHA class II-III symptoms. Cont Toprol-XL 10035mcontinue lisinopril 10  Farxiga 10 mg daily.  Plan for left heart cath to evaluate ischemia as cause for reduced ejection fraction. 3. Patient with history of hypertension.  BP controlled.  Torsemide, lisinopril, Toprol-XL.  Follow-up after left heart cath.  Total encounter time 40  minutes  Greater than 50% was spent in counseling and coordination of care with the patient   This note was generated in part or whole with voice recognition software. Voice recognition is usually quite accurate but there are transcription errors that can and very often do occur. I apologize for any typographical errors that were not detected and corrected.  Medication Adjustments/Labs and Tests Ordered: Current medicines are reviewed at length with the patient today.  Concerns regarding medicines are outlined above.  Orders Placed This Encounter  Procedures  . Basic metabolic panel  . CBC with Differential/Platelet  . EKG 12-Lead   No orders of the defined types were placed in this encounter.   Patient Instructions  Medication Instructions:  Your physician recommends that you continue on your current medications as directed. Please refer to the Current Medication list given to you today.  *If you need a refill on your cardiac medications before your next appointment, please call your pharmacy*   Lab Work: 1-  Your physician recommends that you return for lab work in: TODAY - BMET, CBC.   2-  COVID PRE- TEST: You will need a COVID TEST prior to the procedure:  LOCATION: [facility]  site.  DATE/TIME:  Wednesday, April 03, 2021 sometime between 8 am and 1 pm.  If you have labs (blood work) drawn today and your tests are completely normal, you will receive your results only by: . MyChart Message (if you have MyChart) OR . A paper copy in the mail If you have any lab test that is abnormal or we need to change your treatment, we will call you to review the results.   Testing/Procedures:  Your physician has requested that you have a cardiac catheterization. Cardiac catheterization is used to diagnose and/or treat various heart conditions. Doctors may recommend this procedure for a number of different reasons. The most common reason is to evaluate chest pain. Chest pain can be a symptom of coronary artery disease (CAD), and cardiac catheterization can show whether plaque is narrowing or blocking your heart's arteries. This procedure is also used to evaluate the valves, as well as measure the blood flow and oxygen levels in different parts of your heart. For further information please visit [email or web address]. Please follow instruction sheet, as given.   You are scheduled for a LEFT Cardiac Catheterization on Friday, October 29 with Dr. Muhammad Arida.  1. Please arrive at the [facility] at 6:30 AM (This time is two hours before your procedure to ensure your preparation). Free valet parking service is available.   Special note: Every effort is made to have your procedure done on time. Please understand that emergencies sometimes delay scheduled procedures.  2. Diet: Do not eat solid foods after midnight.  The patient may have clear liquids until 5am upon the day of the procedure.  3. Labs: You will need to have blood drawn on TODAY - BMET, CBC.   COVID PRE- TEST: You will need a COVID TEST prior to the procedure:  LOCATION: [facility] site.  DATE/TIME:  Wednesday, March 29, 2021 sometime between 8 am and 1 pm.  4. Medication  instructions in preparation for your procedure:     preparation for your procedure:   Contrast Allergy: No  Stop taking Eliquis (Apixiban) on Wednesday, October 27.  Stop taking, Torsemide (Demadex) Friday, October 29,   Do not take Diabetes Med (Metformin, Glipizide, Actos) on the day of the procedure and HOLD 48 HOURS AFTER THE PROCEDURE.  On the morning of your procedure, take your Aspirin and any morning medicines NOT listed above.  You may use sips of water.  5. Plan for one night stay--bring personal belongings. 6. Bring a current list of your medications and current insurance cards. 7. You MUST have a responsible person to drive you home. 8. Someone MUST be with you the first 24 hours after you arrive home or your discharge will be delayed. 9. Please wear clothes that are easy to get on and off and wear slip-on shoes.  Thank you for allowing [place] to care for you!   -- [facility]    Follow-Up: At [facility], you and your health needs are our priority.  As part of our continuing mission to provide you with exceptional heart care, we have created designated Provider Care Teams.  These Care Teams include your primary Cardiologist (physician) and Advanced Practice Providers (APPs -  Physician Assistants and Nurse Practitioners) who all work together to provide you with the care you need, when you need it.  We recommend signing up for the patient portal called "MyChart".  Sign up information is provided on this After Visit Summary.  MyChart is used to connect with patients for Virtual Visits (Telemedicine).  Patients are able to view lab/test results, encounter notes, upcoming appointments, etc.  Non-urgent messages can be sent to your provider as well.   To learn more about what you can do with MyChart, go to [email or web address].    Your next appointment:   1 week after cath  The format for your next appointment:   In Person  Provider:   Kate Sable,  MD     Coronary Angiogram With Stent Coronary angiogram with stent placement is a procedure to widen or open a narrow blood vessel of the heart (coronary artery). Arteries may become blocked by cholesterol buildup (plaques) in the lining of the artery wall. When a coronary artery becomes partially blocked, blood flow to that area decreases. This may lead to chest pain or a heart attack (myocardial infarction). A stent is a small piece of metal that looks like mesh or spring. Stent placement may be done as treatment after a heart attack, or to prevent a heart attack if a blocked artery is found by a coronary angiogram. Let your health care provider know about:  Any allergies you have, including allergies to medicines or contrast dye.  All medicines you are taking, including vitamins, herbs, eye drops, creams, and over-the-counter medicines.  Any problems you or family members have had with anesthetic medicines.  Any blood disorders you have.  Any surgeries you have had.  Any medical conditions you have, including kidney problems or kidney failure.  Whether you are pregnant or may be pregnant.  Whether you are breastfeeding. What are the risks? Generally, this is a safe procedure. However, serious problems may occur, including:  Damage to nearby structures or organs, such as the heart, blood vessels, or kidneys.  A return of blockage.  Bleeding, infection, or bruising at the insertion site.  A collection of blood under the skin (hematoma) at the insertion site.  A blood clot in another part of the body.  Allergic  reaction to medicines or dyes.  Bleeding into the abdomen (retroperitoneal bleeding).  Stroke (rare).  Heart attack (rare). What happens before the procedure? Staying hydrated Follow instructions from your health care provider about hydration, which may include:  Up to 2 hours before the procedure - you may continue to drink clear liquids, such as water, clear  fruit juice, black coffee, and plain tea.  Eating and drinking restrictions Follow instructions from your health care provider about eating and drinking, which may include:  8 hours before the procedure - stop eating heavy meals or foods, such as meat, fried foods, or fatty foods.  6 hours before the procedure - stop eating light meals or foods, such as toast or cereal.  2 hours before the procedure - stop drinking clear liquids. Medicines Ask your health care provider about:  Changing or stopping your regular medicines. This is especially important if you are taking diabetes medicines or blood thinners.  Taking medicines such as aspirin and ibuprofen. These medicines can thin your blood. Do not take these medicines unless your health care provider tells you to take them. ? Generally, aspirin is recommended before a thin tube, called a catheter, is passed through a blood vessel and inserted into the heart (cardiac catheterization).  Taking over-the-counter medicines, vitamins, herbs, and supplements. General instructions  Do not use any products that contain nicotine or tobacco for at least 4 weeks before the procedure. These products include cigarettes, e-cigarettes, and chewing tobacco. If you need help quitting, ask your health care provider.  Plan to have someone take you home from the hospital or clinic.  If you will be going home right after the procedure, plan to have someone with you for 24 hours.  You may have tests and imaging procedures.  Ask your health care provider: ? How your insertion site will be marked. Ask which artery will be used for the procedure. ? What steps will be taken to help prevent infection. These may include:  Removing hair at the insertion site.  Washing skin with a germ-killing soap.  Taking antibiotic medicine. What happens during the procedure?   An IV will be inserted into one of your veins.  Electrodes may be placed on your chest to  monitor your heart rate during the procedure.  You will be given one or more of the following: ? A medicine to help you relax (sedative). ? A medicine to numb the area (local anesthetic) for catheter insertion.  A small incision will be made for catheter insertion.  The catheter will be inserted into an artery using a guide wire. The location may be in your groin, your wrist, or the fold of your arm (near your elbow).  An X-ray procedure (fluoroscopy) will be used to help guide the catheter to the opening of the heart arteries.  A dye will be injected into the catheter. X-rays will be taken. The dye helps to show where any narrowing or blockages are located in the arteries.  Tell your health care provider if you have chest pain or trouble breathing.  A tiny wire will be guided to the blocked spot, and a balloon will be inflated to make the artery wider.  The stent will be expanded to crush the plaques into the wall of the vessel. The stent will hold the area open and improve the blood flow. Most stents have a drug coating to reduce the risk of the stent narrowing over time.  The artery may be made wider using  a drill, laser, or other tools that remove plaques.  The catheter will be removed when the blood flow improves. The stent will stay where it was placed, and the lining of the artery will grow over it.  A bandage (dressing) will be placed on the insertion site. Pressure will be applied to stop bleeding.  The IV will be removed. This procedure may vary among health care providers and hospitals. What happens after the procedure?  Your blood pressure, heart rate, breathing rate, and blood oxygen level will be monitored until you leave the hospital or clinic.  If the procedure is done through the leg, you will lie flat in bed for a few hours or for as long as told by your health care provider. You will be instructed not to bend or cross your legs.  The insertion site and the pulse  in your foot or wrist will be checked often.  You may have more blood tests, X-rays, and a test that records the electrical activity of your heart (electrocardiogram, or ECG).  Do not drive for 24 hours if you were given a sedative during your procedure. Summary  Coronary angiogram with stent placement is a procedure to widen or open a narrowed coronary artery. This is done to treat heart problems.  Before the procedure, let your health care provider know about all the medical conditions and surgeries you have or have had.  This is a safe procedure. However, some problems may occur, including damage to nearby structures or organs, bleeding, blood clots, or allergies.  Follow your health care provider's instructions about eating, drinking, medicines, and other lifestyle changes, such as quitting tobacco use before the procedure. This information is not intended to replace advice given to you by your health care provider. Make sure you discuss any questions you have with your health care provider. Document Revised: 12/14/2018 Document Reviewed: 12/14/2018 Elsevier Patient Education  2020 Sun, Kate Sable, MD  03/25/2020 1:09 PM    [place]

## 2020-03-25 NOTE — Patient Instructions (Signed)
Medication Instructions:  Your physician recommends that you continue on your current medications as directed. Please refer to the Current Medication list given to you today.  *If you need a refill on your cardiac medications before your next appointment, please call your pharmacy*   Lab Work: 1-  Your physician recommends that you return for lab work in: TODAY - BMET, CBC.   2-  COVID PRE- TEST: You will need a COVID TEST prior to the procedure:  LOCATION: [facility]Thru Testing site.  DATE/TIME:  Wednesday, April 03, 2021 sometime between 8 am and 1 pm.  If you have labs (blood work) drawn today and your tests are completely normal, you will receive your results only by:  MyChart Message (if you have MyChart) OR  A paper copy in the mail If you have any lab test that is abnormal or we need to change your treatment, we will call you to review the results.   Testing/Procedures:  Your physician has requested that you have a cardiac catheterization. Cardiac catheterization is used to diagnose and/or treat various heart conditions. Doctors may recommend this procedure for a number of different reasons. The most common reason is to evaluate chest pain. Chest pain can be a symptom of coronary artery disease (CAD), and cardiac catheterization can show whether plaque is narrowing or blocking your hearts arteries. This procedure is also used to evaluate the valves, as well as measure the blood flow and oxygen levels in different parts of your heart. For further information please visit [email or web address] Please follow instruction sheet, as given.   You are scheduled for a LEFT Cardiac Catheterization on Friday, October 29 with Dr. Lorine Bears.  1. Please arrive at the [facility] at 6:30 AM (This time is two hours before your procedure to ensure your preparation). Free valet parking service is available.   Special note: Every effort is made to have your  procedure done on time. Please understand that emergencies sometimes delay scheduled procedures.  2. Diet: Do not eat solid foods after midnight.  The patient may have clear liquids until 5am upon the day of the procedure.  3. Labs: You will need to have blood drawn on TODAY - BMET, CBC.   COVID PRE- TEST: You will need a COVID TEST prior to the procedure:  LOCATION: [facility]Thru Testing site.  DATE/TIME:  Wednesday, March 29, 2021 sometime between 8 am and 1 pm.  4. Medication instructions in preparation for your procedure:   Contrast Allergy: No  Stop taking Eliquis (Apixiban) on Wednesday, October 27.  Stop taking, Torsemide (Demadex) Friday, October 29,   Do not take Diabetes Med (Metformin, Glipizide, Actos) on the day of the procedure and HOLD 48 HOURS AFTER THE PROCEDURE.  On the morning of your procedure, take your Aspirin and any morning medicines NOT listed above.  You may use sips of water.  5. Plan for one night stay--bring personal belongings. 6. Bring a current list of your medications and current insurance cards. 7. You MUST have a responsible person to drive you home. 8. Someone MUST be with you the first 24 hours after you arrive home or your discharge will be delayed. 9. Please wear clothes that are easy to get on and off and wear slip-on shoes.  Thank you for allowing [place] to care for you!   -- [facility]    Follow-Up: At [facility], you and your health needs are our  priority.  As part of our continuing mission to provide you with exceptional heart care, we have created designated Provider Care Teams.  These Care Teams include your primary Cardiologist (physician) and Advanced Practice Providers (APPs -  Physician Assistants and Nurse Practitioners) who all work together to provide you with the care you need, when you need it.  We recommend signing up for the patient portal called "MyChart".  Sign up  information is provided on this After Visit Summary.  MyChart is used to connect with patients for Virtual Visits (Telemedicine).  Patients are able to view lab/test results, encounter notes, upcoming appointments, etc.  Non-urgent messages can be sent to your provider as well.   To learn more about what you can do with MyChart, go to [email or web address].    Your next appointment:   1 week after cath  The format for your next appointment:   In Person  Provider:   Debbe Odea, MD     Coronary Angiogram With Stent Coronary angiogram with stent placement is a procedure to widen or open a narrow blood vessel of the heart (coronary artery). Arteries may become blocked by cholesterol buildup (plaques) in the lining of the artery wall. When a coronary artery becomes partially blocked, blood flow to that area decreases. This may lead to chest pain or a heart attack (myocardial infarction). A stent is a small piece of metal that looks like mesh or spring. Stent placement may be done as treatment after a heart attack, or to prevent a heart attack if a blocked artery is found by a coronary angiogram. Let your health care provider know about:  Any allergies you have, including allergies to medicines or contrast dye.  All medicines you are taking, including vitamins, herbs, eye drops, creams, and over-the-counter medicines.  Any problems you or family members have had with anesthetic medicines.  Any blood disorders you have.  Any surgeries you have had.  Any medical conditions you have, including kidney problems or kidney failure.  Whether you are pregnant or may be pregnant.  Whether you are breastfeeding. What are the risks? Generally, this is a safe procedure. However, serious problems may occur, including:  Damage to nearby structures or organs, such as the heart, blood vessels, or kidneys.  A return of blockage.  Bleeding, infection, or bruising at the insertion site.  A  collection of blood under the skin (hematoma) at the insertion site.  A blood clot in another part of the body.  Allergic reaction to medicines or dyes.  Bleeding into the abdomen (retroperitoneal bleeding).  Stroke (rare).  Heart attack (rare). What happens before the procedure? Staying hydrated Follow instructions from your health care provider about hydration, which may include:  Up to 2 hours before the procedure - you may continue to drink clear liquids, such as water, clear fruit juice, black coffee, and plain tea.  Eating and drinking restrictions Follow instructions from your health care provider about eating and drinking, which may include:  8 hours before the procedure - stop eating heavy meals or foods, such as meat, fried foods, or fatty foods.  6 hours before the procedure - stop eating light meals or foods, such as toast or cereal.  2 hours before the procedure - stop drinking clear liquids. Medicines Ask your health care provider about:  Changing or stopping your regular medicines. This is especially important if you are taking diabetes medicines or blood thinners.  Taking medicines such as aspirin and ibuprofen. These medicines can  thin your blood. Do not take these medicines unless your health care provider tells you to take them. ? Generally, aspirin is recommended before a thin tube, called a catheter, is passed through a blood vessel and inserted into the heart (cardiac catheterization).  Taking over-the-counter medicines, vitamins, herbs, and supplements. General instructions  Do not use any products that contain nicotine or tobacco for at least 4 weeks before the procedure. These products include cigarettes, e-cigarettes, and chewing tobacco. If you need help quitting, ask your health care provider.  Plan to have someone take you home from the hospital or clinic.  If you will be going home right after the procedure, plan to have someone with you for 24  hours.  You may have tests and imaging procedures.  Ask your health care provider: ? How your insertion site will be marked. Ask which artery will be used for the procedure. ? What steps will be taken to help prevent infection. These may include:  Removing hair at the insertion site.  Washing skin with a germ-killing soap.  Taking antibiotic medicine. What happens during the procedure?   An IV will be inserted into one of your veins.  Electrodes may be placed on your chest to monitor your heart rate during the procedure.  You will be given one or more of the following: ? A medicine to help you relax (sedative). ? A medicine to numb the area (local anesthetic) for catheter insertion.  A small incision will be made for catheter insertion.  The catheter will be inserted into an artery using a guide wire. The location may be in your groin, your wrist, or the fold of your arm (near your elbow).  An X-ray procedure (fluoroscopy) will be used to help guide the catheter to the opening of the heart arteries.  A dye will be injected into the catheter. X-rays will be taken. The dye helps to show where any narrowing or blockages are located in the arteries.  Tell your health care provider if you have chest pain or trouble breathing.  A tiny wire will be guided to the blocked spot, and a balloon will be inflated to make the artery wider.  The stent will be expanded to crush the plaques into the wall of the vessel. The stent will hold the area open and improve the blood flow. Most stents have a drug coating to reduce the risk of the stent narrowing over time.  The artery may be made wider using a drill, laser, or other tools that remove plaques.  The catheter will be removed when the blood flow improves. The stent will stay where it was placed, and the lining of the artery will grow over it.  A bandage (dressing) will be placed on the insertion site. Pressure will be applied to stop  bleeding.  The IV will be removed. This procedure may vary among health care providers and hospitals. What happens after the procedure?  Your blood pressure, heart rate, breathing rate, and blood oxygen level will be monitored until you leave the hospital or clinic.  If the procedure is done through the leg, you will lie flat in bed for a few hours or for as long as told by your health care provider. You will be instructed not to bend or cross your legs.  The insertion site and the pulse in your foot or wrist will be checked often.  You may have more blood tests, X-rays, and a test that records the electrical activity  of your heart (electrocardiogram, or ECG).  Do not drive for 24 hours if you were given a sedative during your procedure. Summary  Coronary angiogram with stent placement is a procedure to widen or open a narrowed coronary artery. This is done to treat heart problems.  Before the procedure, let your health care provider know about all the medical conditions and surgeries you have or have had.  This is a safe procedure. However, some problems may occur, including damage to nearby structures or organs, bleeding, blood clots, or allergies.  Follow your health care provider's instructions about eating, drinking, medicines, and other lifestyle changes, such as quitting tobacco use before the procedure. This information is not intended to replace advice given to you by your health care provider. Make sure you discuss any questions you have with your health care provider. Document Revised: 12/14/2018 Document Reviewed: 12/14/2018 Elsevier Patient Education  2020 [employer].

## 2020-03-26 LAB — CBC WITH DIFFERENTIAL/PLATELET
Basophils Absolute: 0.1 10*3/uL (ref 0.0–0.2)
Basos: 1 %
EOS (ABSOLUTE): 0.2 10*3/uL (ref 0.0–0.4)
Eos: 3 %
Hematocrit: 39.6 % (ref 37.5–51.0)
Hemoglobin: 12.7 g/dL — ABNORMAL LOW (ref 13.0–17.7)
Immature Grans (Abs): 0 10*3/uL (ref 0.0–0.1)
Immature Granulocytes: 0 %
Lymphocytes Absolute: 2.7 10*3/uL (ref 0.7–3.1)
Lymphs: 35 %
MCH: 26.5 pg — ABNORMAL LOW (ref 26.6–33.0)
MCHC: 32.1 g/dL (ref 31.5–35.7)
MCV: 83 fL (ref 79–97)
Monocytes Absolute: 0.8 10*3/uL (ref 0.1–0.9)
Monocytes: 11 %
Neutrophils Absolute: 3.9 10*3/uL (ref 1.4–7.0)
Neutrophils: 50 %
Platelets: 206 10*3/uL (ref 150–450)
RBC: 4.79 x10E6/uL (ref 4.14–5.80)
RDW: 17.7 % — ABNORMAL HIGH (ref 11.6–15.4)
WBC: 7.7 10*3/uL (ref 3.4–10.8)

## 2020-03-26 LAB — BASIC METABOLIC PANEL
BUN/Creatinine Ratio: 18 (ref 10–24)
BUN: 22 mg/dL (ref 8–27)
CO2: 25 mmol/L (ref 20–29)
Calcium: 9.2 mg/dL (ref 8.6–10.2)
Chloride: 97 mmol/L (ref 96–106)
Creatinine, Ser: 1.25 mg/dL (ref 0.76–1.27)
GFR calc Af Amer: 71 mL/min/{1.73_m2} (ref >59)
GFR calc non Af Amer: 61 mL/min/{1.73_m2} (ref >59)
Glucose: 111 mg/dL — ABNORMAL HIGH (ref 65–99)
Potassium: 4.2 mmol/L (ref 3.5–5.2)
Sodium: 137 mmol/L (ref 134–144)

## 2020-03-28 ENCOUNTER — Telehealth: Payer: Medicare Other

## 2020-04-02 ENCOUNTER — Telehealth: Payer: Self-pay

## 2020-04-02 NOTE — Telephone Encounter (Signed)
°  Chronic Care Management   Outreach Note  04/02/2020 Name: Taylor [name] MRN: [id number] DOB: 24-Dec-1955  Primary Care Provider: Malva Limes, MD Reason for referral : Chronic Care Management    An unsuccessful telephone outreach was attempted today. [name] was referred to the case management team for assistance with care management and care coordination.     PLAN Phone rang multiple times without option to leave a voice message.  A member of the care management team will reach out to [name] again within the next two weeks.    [facility] Care Management [facility] [phone]

## 2020-04-03 ENCOUNTER — Other Ambulatory Visit: Payer: Self-pay

## 2020-04-03 ENCOUNTER — Other Ambulatory Visit
Admission: RE | Admit: 2020-04-03 | Discharge: 2020-04-03 | Disposition: A | Discharge status: Home / Self Care | Payer: Medicare Other | Source: Ambulatory Visit | Attending: Cardiology | Admitting: Cardiology

## 2020-04-03 DIAGNOSIS — Z01812 Encounter for preprocedural laboratory examination: Secondary | ICD-10-CM | POA: Insufficient documentation

## 2020-04-03 DIAGNOSIS — Z20822 Contact with and (suspected) exposure to covid-19: Secondary | ICD-10-CM | POA: Insufficient documentation

## 2020-04-03 LAB — SARS CORONAVIRUS 2 (TAT 6-24 HRS): SARS Coronavirus 2: NEGATIVE

## 2020-04-05 ENCOUNTER — Other Ambulatory Visit: Payer: Self-pay

## 2020-04-05 ENCOUNTER — Ambulatory Visit
Admission: RE | Admit: 2020-04-05 | Discharge: 2020-04-05 | Disposition: A | Discharge status: Home / Self Care | Payer: Medicare Other | Attending: Cardiovascular Disease | Admitting: Cardiovascular Disease

## 2020-04-05 ENCOUNTER — Encounter: Payer: Self-pay | Admitting: Cardiovascular Disease

## 2020-04-05 ENCOUNTER — Encounter
Admission: RE | Disposition: A | Discharge status: Home / Self Care | Payer: Self-pay | Source: Home / Self Care | Attending: Cardiovascular Disease

## 2020-04-05 DIAGNOSIS — I2510 Atherosclerotic heart disease of native coronary artery without angina pectoris: Secondary | ICD-10-CM

## 2020-04-05 DIAGNOSIS — I5020 Unspecified systolic (congestive) heart failure: Secondary | ICD-10-CM

## 2020-04-05 DIAGNOSIS — E119 Type 2 diabetes mellitus without complications: Secondary | ICD-10-CM | POA: Insufficient documentation

## 2020-04-05 DIAGNOSIS — I110 Hypertensive heart disease with heart failure: Secondary | ICD-10-CM | POA: Insufficient documentation

## 2020-04-05 DIAGNOSIS — I4819 Other persistent atrial fibrillation: Secondary | ICD-10-CM | POA: Insufficient documentation

## 2020-04-05 DIAGNOSIS — I4891 Unspecified atrial fibrillation: Secondary | ICD-10-CM

## 2020-04-05 HISTORY — PX: LEFT HEART CATH AND CORONARY ANGIOGRAPHY: CATH118249

## 2020-04-05 LAB — GLUCOSE, CAPILLARY: Glucose-Capillary: 135 mg/dL — ABNORMAL HIGH (ref 70–99)

## 2020-04-05 SURGERY — LEFT HEART CATH AND CORONARY ANGIOGRAPHY
Anesthesia: Moderate Sedation | Laterality: Left

## 2020-04-05 MED ORDER — ACETAMINOPHEN 325 MG PO TABS: 650.0000 mg | ORAL_TABLET | ORAL | Status: DC | PRN

## 2020-04-05 MED ORDER — MIDAZOLAM HCL 2 MG/2ML IJ SOLN
INTRAMUSCULAR | Status: AC
  Filled 2020-04-05: qty 2

## 2020-04-05 MED ORDER — SODIUM CHLORIDE 0.9 % IV SOLN: INTRAVENOUS | Status: DC

## 2020-04-05 MED ORDER — HEPARIN SODIUM (PORCINE) 1000 UNIT/ML IJ SOLN
INTRAMUSCULAR | Status: AC
  Filled 2020-04-05: qty 1

## 2020-04-05 MED ORDER — SODIUM CHLORIDE 0.9% FLUSH: 3.0000 mL | Freq: Two times a day (BID) | INTRAVENOUS | Status: DC

## 2020-04-05 MED ORDER — ONDANSETRON HCL 4 MG/2ML IJ SOLN: 4.0000 mg | Freq: Four times a day (QID) | INTRAMUSCULAR | Status: DC | PRN

## 2020-04-05 MED ORDER — IOHEXOL 300 MG/ML  SOLN
INTRAMUSCULAR | Status: DC | PRN
  Administered 2020-04-05: 65 mL

## 2020-04-05 MED ORDER — HEPARIN (PORCINE) IN NACL 1000-0.9 UT/500ML-% IV SOLN
INTRAVENOUS | Status: AC
  Filled 2020-04-05: qty 1000

## 2020-04-05 MED ORDER — HEPARIN (PORCINE) IN NACL 1000-0.9 UT/500ML-% IV SOLN
INTRAVENOUS | Status: DC | PRN
  Administered 2020-04-05: 500 mL

## 2020-04-05 MED ORDER — VERAPAMIL HCL 2.5 MG/ML IV SOLN
INTRAVENOUS | Status: DC | PRN
  Administered 2020-04-05: 2.5 mg via INTRAVENOUS

## 2020-04-05 MED ORDER — LIDOCAINE HCL (PF) 1 % IJ SOLN
INTRAMUSCULAR | Status: AC
  Filled 2020-04-05: qty 30

## 2020-04-05 MED ORDER — SODIUM CHLORIDE 0.9% FLUSH: 3.0000 mL | INTRAVENOUS | Status: DC | PRN

## 2020-04-05 MED ORDER — SODIUM CHLORIDE 0.9 % IV SOLN: 250.0000 mL | INTRAVENOUS | Status: DC | PRN

## 2020-04-05 MED ORDER — FENTANYL CITRATE (PF) 100 MCG/2ML IJ SOLN
INTRAMUSCULAR | Status: AC
  Filled 2020-04-05: qty 2

## 2020-04-05 MED ORDER — MIDAZOLAM HCL 2 MG/2ML IJ SOLN
INTRAMUSCULAR | Status: DC | PRN
  Administered 2020-04-05: 2 mg via INTRAVENOUS

## 2020-04-05 MED ORDER — HEPARIN SODIUM (PORCINE) 1000 UNIT/ML IJ SOLN
INTRAMUSCULAR | Status: DC | PRN
  Administered 2020-04-05: 6000 [IU] via INTRAVENOUS

## 2020-04-05 MED ORDER — LIDOCAINE HCL (PF) 1 % IJ SOLN
INTRAMUSCULAR | Status: DC | PRN
  Administered 2020-04-05: 2 mL

## 2020-04-05 MED ORDER — VERAPAMIL HCL 2.5 MG/ML IV SOLN
INTRAVENOUS | Status: AC
  Filled 2020-04-05: qty 2

## 2020-04-05 SURGICAL SUPPLY — 6 items
DEVICE RAD COMP TR BAND LRG (VASCULAR PRODUCTS) ×3 IMPLANT
GLIDESHEATH SLEND SS 6F .021 (SHEATH) ×3 IMPLANT
GUIDEWIRE INQWIRE 1.5J.035X260 (WIRE) ×1 IMPLANT
INQWIRE 1.5J .035X260CM (WIRE) ×3
KIT MANI 3VAL PERCEP (MISCELLANEOUS) ×3 IMPLANT
PACK CARDIAC CATH (CUSTOM PROCEDURE TRAY) ×3 IMPLANT

## 2020-04-05 NOTE — Interval H&P Note (Signed)
History and Physical Interval Note:  04/05/2020 7:59 AM  Taylor [name]  has presented today for surgery, with the diagnosis of LT Heart Cath   HF with reduced EF.  The various methods of treatment have been discussed with the patient and family. After consideration of risks, benefits and other options for treatment, the patient has consented to  Procedure(s): LEFT HEART CATH AND CORONARY ANGIOGRAPHY (Left) as a surgical intervention.  The patient's history has been reviewed, patient examined, no change in status, stable for surgery.  I have reviewed the patient's chart and labs.  Questions were answered to the patient's satisfaction.     Lorine Bears

## 2020-04-05 NOTE — Discharge Instructions (Signed)
Resume Eliquis tomorrow morning 

## 2020-04-09 ENCOUNTER — Encounter: Payer: Self-pay | Admitting: Family Medicine

## 2020-04-09 ENCOUNTER — Telehealth: Payer: Self-pay

## 2020-04-09 DIAGNOSIS — I2510 Atherosclerotic heart disease of native coronary artery without angina pectoris: Secondary | ICD-10-CM | POA: Insufficient documentation

## 2020-04-09 NOTE — Telephone Encounter (Signed)
We have samples available. Ok to give to the patient?

## 2020-04-09 NOTE — Telephone Encounter (Signed)
Samples put up front for patient and patient notified

## 2020-04-09 NOTE — Telephone Encounter (Signed)
Copied from CRM [id number]. Topic: General - Other >> Apr 08, 2020  4:41 PM [name], Taylor wrote: Reason for CRM: Pt stated he usually get samples of the dapagliflozin propanediol (FARXIGA) 10 MG TABS tablet. Pt would like a call back to advise when he can come by the office to pick up samples. Cb# [phone]

## 2020-04-09 NOTE — Telephone Encounter (Signed)
He can one months supply Farxiga 10mg . Samples.

## 2020-04-12 ENCOUNTER — Other Ambulatory Visit: Payer: Self-pay

## 2020-04-12 ENCOUNTER — Encounter: Payer: Self-pay | Admitting: Cardiology

## 2020-04-12 ENCOUNTER — Ambulatory Visit: Payer: Medicare Other | Admitting: Cardiology

## 2020-04-12 VITALS — BP 112/86 | HR 80 | Ht 72.0 in | Wt 356.0 lb

## 2020-04-12 DIAGNOSIS — Z6841 Body Mass Index (BMI) 40.0 and over, adult: Secondary | ICD-10-CM

## 2020-04-12 DIAGNOSIS — I2510 Atherosclerotic heart disease of native coronary artery without angina pectoris: Secondary | ICD-10-CM

## 2020-04-12 DIAGNOSIS — I4891 Unspecified atrial fibrillation: Secondary | ICD-10-CM

## 2020-04-12 DIAGNOSIS — I428 Other cardiomyopathies: Secondary | ICD-10-CM

## 2020-04-12 DIAGNOSIS — I10 Essential (primary) hypertension: Secondary | ICD-10-CM | POA: Diagnosis not present

## 2020-04-12 DIAGNOSIS — E1165 Type 2 diabetes mellitus with hyperglycemia: Secondary | ICD-10-CM

## 2020-04-12 NOTE — Patient Instructions (Signed)
Medication Instructions:  Your physician recommends that you continue on your current medications as directed. Please refer to the Current Medication list given to you today.  *If you need a refill on your cardiac medications before your next appointment, please call your pharmacy*   Lab Work: None Ordered If you have labs (blood work) drawn today and your tests are completely normal, you will receive your results only by: Marland Kitchen MyChart Message (if you have MyChart) OR . A paper copy in the mail If you have any lab test that is abnormal or we need to change your treatment, we will call you to review the results.   Testing/Procedures: None Ordered   Follow-Up: At [facility], you and your health needs are our priority.  As part of our continuing mission to provide you with exceptional heart care, we have created designated Provider Care Teams.  These Care Teams include your primary Cardiologist (physician) and Advanced Practice Providers (APPs -  Physician Assistants and Nurse Practitioners) who all work together to provide you with the care you need, when you need it.  We recommend signing up for the patient portal called "MyChart".  Sign up information is provided on this After Visit Summary.  MyChart is used to connect with patients for Virtual Visits (Telemedicine).  Patients are able to view lab/test results, encounter notes, upcoming appointments, etc.  Non-urgent messages can be sent to your provider as well.   To learn more about what you can do with MyChart, go to [email or web address].    Your next appointment:   1 month(s)  The format for your next appointment:   In Person  Provider:   Debbe Odea, MD   Other Instructions  Nutritionist referral

## 2020-04-12 NOTE — Progress Notes (Signed)
Cardiology Office Note:    Date:  04/12/2020   ID:  Taylor [name], DOB 05-Apr-1956, MRN [id number]  PCP:  Birdie Sons, MD  Cardiologist:  Kate Sable, MD  Electrophysiologist:  None   Referring MD: Birdie Sons, MD   Chief Complaint  Patient presents with  . Follow-up    Follow up and medications verbally reviewed with patient.     History of Present Illness:    Taylor [name] is a 64 y.o. male with a hx of hypertension, CAD (60% mLAD, 30% D2), diabetes, atrial fibrillation, NICM, EF 30 to 35% who presents for follow-up.  Patient had a left heart cath on 10/29 to evaluate ischemia.  No significant obstruction was noted.  He is tolerating all medications as prescribed.  Was diagnosed with sleep apnea, plans to follow-up with pulmonary medicine today for equipment.  Edema is improved, has minimal shortness of breath.  Prior notes Echocardiogram 12/2019 showed moderate to severely reduced EF, EF 30 to 35%. Left heart cath 03/2020 mid LAD 60% stenosis, second diagonal 30% stenosis.   Past Medical History:  Diagnosis Date  . Arthritis   . Broken leg 02/07/2019  . Depression   . Diabetes mellitus without complication ([place])   . Hypertension     Past Surgical History:  Procedure Laterality Date  . BACK SURGERY    . LEFT HEART CATH AND CORONARY ANGIOGRAPHY Left 04/05/2020   Procedure: LEFT HEART CATH AND CORONARY ANGIOGRAPHY;  Surgeon: Wellington Hampshire, MD;  Location: [facility];  Service: Cardiovascular;  Laterality: Left;    Current Medications: Current Meds  Medication Sig  . apixaban (ELIQUIS) 5 MG TABS tablet Take 1 tablet (5 mg total) by mouth 2 (two) times daily.  Marland Kitchen aspirin EC 81 MG tablet Take 81 mg by mouth daily. Swallow whole.  . dapagliflozin propanediol (FARXIGA) 10 MG TABS tablet Take 1 tablet (10 mg total) by mouth daily before breakfast.  . glipiZIDE (GLUCOTROL XL) 10 MG 24 hr tablet TAKE 1 TABLET BY MOUTH ONCE DAILY. (Patient  taking differently: Take 10 mg by mouth daily with breakfast. )  . glucose blood (ONETOUCH ULTRA) test strip Use to check sugar daily for type 2 diabetes E11.9  . HYDROmorphone (DILAUDID) 4 MG tablet Take 4 mg by mouth in the morning, at noon, in the evening, and at bedtime. Must be name brand  . lisinopril (ZESTRIL) 10 MG tablet TAKE 1 TABLET BY MOUTH ONCE DAILY. (Patient taking differently: Take 10 mg by mouth daily. )  . metFORMIN (GLUCOPHAGE) 1000 MG tablet TAKE 1 TABLET BY MOUTH TWICE DAILY (Patient taking differently: Take 1,000 mg by mouth 2 (two) times daily with a meal. )  . NARCAN 4 MG/0.1ML LIQD nasal spray kit Place 0.4 mg into the nose once.   . OXYCONTIN 20 MG 12 hr tablet Take 20 mg by mouth See admin instructions. Take 20 mg by mouth 12 noon and 2400  . OXYCONTIN 30 MG 12 hr tablet Take 30 mg by mouth See admin instructions. Take 30 mg by mouth at 0500 and 1700  . pioglitazone (ACTOS) 45 MG tablet TAKE 1 TABLET BY MOUTH ONCE DAILY. (Patient taking differently: Take 45 mg by mouth daily at 12 noon. )  . potassium chloride SA (KLOR-CON) 20 MEQ tablet Take 1 tablet (20 mEq total) by mouth daily.  . pregabalin (LYRICA) 100 MG capsule Take 100 mg by mouth 3 (three) times daily.   . simvastatin (ZOCOR) 40 MG  tablet TAKE 1 TABLET BY MOUTH ONCE DAILY. (Patient taking differently: Take 40 mg by mouth daily at 12 noon. )  . torsemide (DEMADEX) 20 MG tablet Take 2 tablets (40 mg total) by mouth daily. (Patient taking differently: Take 20 mg by mouth 2 (two) times daily. Morning and at lunch)  . vitamin B-12 (CYANOCOBALAMIN) 1000 MCG tablet Take 1,000 mcg by mouth daily.  Marland Kitchen zolpidem (AMBIEN CR) 12.5 MG CR tablet Take 12.5 mg by mouth at bedtime.      Allergies:   Other   Social History   Socioeconomic History  . Marital status: Single    Spouse name: Not on file  . Number of children: 1  . Years of education: Not on file  . Highest education level: Some college, no degree  Occupational  History  . Occupation: disability/retired  Tobacco Use  . Smoking status: Never Smoker  . Smokeless tobacco: Never Used  Vaping Use  . Vaping Use: Never used  Substance and Sexual Activity  . Alcohol use: No    Alcohol/week: 0.0 standard drinks  . Drug use: No  . Sexual activity: Not on file  Other Topics Concern  . Not on file  Social History Narrative   Lives with Mother Taylor [name].    Social Determinants of Health   Financial Resource Strain:   . Difficulty of Paying Living Expenses: Not on file  Food Insecurity:   . Worried About Charity fundraiser in the Last Year: Not on file  . Ran Out of Food in the Last Year: Not on file  Transportation Needs:   . Lack of Transportation (Medical): Not on file  . Lack of Transportation (Non-Medical): Not on file  Physical Activity:   . Days of Exercise per Week: Not on file  . Minutes of Exercise per Session: Not on file  Stress:   . Feeling of Stress : Not on file  Social Connections:   . Frequency of Communication with Friends and Family: Not on file  . Frequency of Social Gatherings with Friends and Family: Not on file  . Attends Religious Services: Not on file  . Active Member of Clubs or Organizations: Not on file  . Attends Archivist Meetings: Not on file  . Marital Status: Not on file     Family History: The patient's family history includes Diabetes in his mother and sister.  ROS:   Please see the history of present illness.     All other systems reviewed and are negative.  EKGs/Labs/Other Studies Reviewed:    The following studies were reviewed today:   EKG:  EKG not ordered today.    Recent Labs: 12/07/2019: ALT 9 03/25/2020: BUN 22; Creatinine, Ser 1.25; Hemoglobin 12.7; Platelets 206; Potassium 4.2; Sodium 137  Recent Lipid Panel    Component Value Date/Time   CHOL 140 09/25/2019 1509   TRIG 106 09/25/2019 1509   HDL 46 09/25/2019 1509   CHOLHDL 3.0 09/25/2019 1509   LDLCALC 75 09/25/2019 1509     Physical Exam:    VS:  BP 112/86 (BP Location: Left Arm, Patient Position: Sitting, Cuff Size: Large)   Pulse 80   Ht 6' (1.829 m)   Wt (!) 356 lb (161.5 kg)   SpO2 95%   BMI 48.28 kg/m     Wt Readings from Last 3 Encounters:  04/12/20 (!) 356 lb (161.5 kg)  04/05/20 (!) 355 lb (161 kg)  03/25/20 (!) 355 lb (161 kg)  GEN:  Well nourished, well developed in no acute distress HEENT: Normal NECK: No JVD; No carotid bruits LYMPHATICS: No lymphadenopathy CARDIAC: Irregular irregular, non tachycardic, no murmurs, rubs, gallops RESPIRATORY:  Clear to auscultation without rales, wheezing or rhonchi  ABDOMEN: Soft, non-tender, non-distended MUSCULOSKELETAL:  trace edema; No deformity  SKIN: Warm and dry NEUROLOGIC:  Alert and oriented x 3 PSYCHIATRIC:  Normal affect   ASSESSMENT:    1. Atrial fibrillation, unspecified type ([place])   2. NICM (nonischemic cardiomyopathy) ([place])   3. Essential hypertension   4. Coronary artery disease involving native coronary artery of native heart without angina pectoris   5. Morbid obesity with BMI of 40.0-44.9, adult ([place])    PLAN:    In order of problems listed above:  1. Patient persistent atrial fibrillation, heart rate controlled.  CHA2DS2-VASc score at 3 (chf,htn. DM).  Continue toprol xl 144m qd, Eliquis 553mtwice daily.  Plan for DC cardioversion after 3-4 weeks of anticoagulation.  Follow-up with pulmonary medicine regarding OSA/CPAP management. 2. NICM, EF 30-35%.  NYHA class II-III symptoms. Cont Toprol-XL 10096mcontinue lisinopril 10 mg daily, torsemide 40 mg daily, Farxiga 10 mg daily.  Left heart cath with nonobstructive disease. 3. Nonobstructive CAD, 60% mid LAD, 30% diagonal.  Continue Eliquis, aspirin, statin. 4. Patient with history of hypertension.  BP controlled.  5. Patient is morbidly obese, will refer patient to nutritionist to help with dietary management and recommendations regarding weight loss.  Follow-up in 1  month  Total encounter time 40  minutes  Greater than 50% was spent in counseling and coordination of care with the patient   This note was generated in part or whole with voice recognition software. Voice recognition is usually quite accurate but there are transcription errors that can and very often do occur. I apologize for any typographical errors that were not detected and corrected.  Medication Adjustments/Labs and Tests Ordered: Current medicines are reviewed at length with the patient today.  Concerns regarding medicines are outlined above.  Orders Placed This Encounter  Procedures  . Ambulatory referral to Nutrition and Diabetic Education   No orders of the defined types were placed in this encounter.   Patient Instructions  Medication Instructions:  Your physician recommends that you continue on your current medications as directed. Please refer to the Current Medication list given to you today.  *If you need a refill on your cardiac medications before your next appointment, please call your pharmacy*   Lab Work: None Ordered If you have labs (blood work) drawn today and your tests are completely normal, you will receive your results only by: . MMarland KitchenChart Message (if you have MyChart) OR . A paper copy in the mail If you have any lab test that is abnormal or we need to change your treatment, we will call you to review the results.   Testing/Procedures: None Ordered   Follow-Up: [facility] and your health needs are our priority.  As part of our continuing mission to provide you with exceptional heart care, we have created designated Provider Care Teams.  These Care Teams include your primary Cardiologist (physician) and Advanced Practice Providers (APPs -  Physician Assistants and Nurse Practitioners) who all work together to provide you with the care you need, when you need it.  We recommend signing up for the patient portal called "MyChart".  Sign up  information is provided on this After Visit Summary.  MyChart is used to connect with patients for Virtual Visits (  Telemedicine).  Patients are able to view lab/test results, encounter notes, upcoming appointments, etc.  Non-urgent messages can be sent to your provider as well.   To learn more about what you can do with MyChart, go to [email or web address].    Your next appointment:   1 month(s)  The format for your next appointment:   In Person  Provider:   Kate Sable, MD   Other Instructions  Nutritionist referral    Signed, Kate Sable, MD  04/12/2020 12:58 PM    [place]

## 2020-04-16 ENCOUNTER — Other Ambulatory Visit: Payer: Self-pay

## 2020-04-16 ENCOUNTER — Ambulatory Visit
Admission: RE | Admit: 2020-04-16 | Discharge: 2020-04-16 | Disposition: A | Discharge status: Home / Self Care | Payer: Medicare Other | Source: Ambulatory Visit | Attending: Physician Assistant | Admitting: Physician Assistant

## 2020-04-16 DIAGNOSIS — H903 Sensorineural hearing loss, bilateral: Secondary | ICD-10-CM

## 2020-04-19 NOTE — Addendum Note (Signed)
Addended by: Gibson Ramp on: 04/19/2020 09:59 AM   Modules accepted: Orders

## 2020-04-25 ENCOUNTER — Telehealth: Payer: Self-pay

## 2020-04-25 NOTE — Telephone Encounter (Signed)
°  Chronic Care Management   Outreach Note  04/25/2020 Name: GERMANY CHELF MRN: [id number] DOB: Nov 08, 1955  Primary Care Provider: Malva Limes, MD Reason for referral : Chronic Care Management   An unsuccessful telephone outreach was attempted today. Mr. Spreen was referred to the case management team for assistance with care management and care coordination.     PLAN A HIPAA compliant voice message was left today requesting a return call.     [facility] Care Management [facility] [phone]

## 2020-05-07 ENCOUNTER — Other Ambulatory Visit: Payer: Self-pay | Admitting: Family Medicine

## 2020-05-07 ENCOUNTER — Telehealth: Payer: Self-pay

## 2020-05-07 DIAGNOSIS — Z794 Long term (current) use of insulin: Secondary | ICD-10-CM

## 2020-05-07 DIAGNOSIS — E1140 Type 2 diabetes mellitus with diabetic neuropathy, unspecified: Secondary | ICD-10-CM

## 2020-05-07 NOTE — Telephone Encounter (Signed)
Requested Prescriptions  Pending Prescriptions Disp Refills   glipiZIDE (GLUCOTROL XL) 10 MG 24 hr tablet [Pharmacy Med Name: GLIPIZIDE ER 10MG  TABLET] 30 tablet 2    Sig: Take 1 tablet (10 mg total) by mouth daily with breakfast.     Endocrinology:  Diabetes - Sulfonylureas Passed - 05/07/2020 10:28 AM      Passed - HBA1C is between 0 and 7.9 and within 180 days    Hemoglobin A1C  Date Value Ref Range Status  01/30/2020 6.9 (A) 4.0 - 5.6 % Final   Hgb A1c MFr Bld  Date Value Ref Range Status  09/25/2019 6.9 (H) 4.8 - 5.6 % Final    Comment:             Prediabetes: 5.7 - 6.4          Diabetes: >6.4          Glycemic control for adults with diabetes: <7.0          Passed - Valid encounter within last 6 months    Recent Outpatient Visits          3 months ago Type 2 diabetes mellitus with diabetic neuropathy, with long-term current use of insulin (HCC)   [facility], MD   5 months ago Edema, unspecified type   [facility] [place], [place], [place]   5 months ago Edema, unspecified type   [place], [place], [place]   6 months ago Edema, unspecified type   [facility], MD   6 months ago Nausea   [facility], MD      Future Appointments            In 1 week Fisher, Malva Limes, MD [facility], [facility]   In 1 week [facility], MD [facility]

## 2020-05-07 NOTE — Telephone Encounter (Signed)
Please advise if ok to give samples of available.

## 2020-05-07 NOTE — Telephone Encounter (Signed)
He can have a month's supply if we have them 

## 2020-05-07 NOTE — Telephone Encounter (Signed)
Copied from CRM [id number]. Topic: General - Inquiry >> May 07, 2020  9:53 AM Daphine Deutscher D wrote: Reason for CRM: Pt called asking if the office has any samples of Farxiga.  He has enough thur Thursday.  CB#  [phone]

## 2020-05-08 MED ORDER — DAPAGLIFLOZIN PROPANEDIOL 10 MG PO TABS: 10.0000 mg | ORAL_TABLET | Freq: Every day | ORAL | 0 refills | Status: DC

## 2020-05-08 NOTE — Telephone Encounter (Signed)
Samples placed up front. Patient advised.  

## 2020-05-13 ENCOUNTER — Ambulatory Visit (INDEPENDENT_AMBULATORY_CARE_PROVIDER_SITE_OTHER): Payer: Medicare Other | Admitting: Family Medicine

## 2020-05-13 ENCOUNTER — Encounter: Payer: Self-pay | Admitting: Family Medicine

## 2020-05-13 ENCOUNTER — Other Ambulatory Visit: Payer: Self-pay

## 2020-05-13 VITALS — BP 134/82 | HR 111 | Temp 98.4°F | Resp 20 | Wt 364.0 lb

## 2020-05-13 DIAGNOSIS — Z794 Long term (current) use of insulin: Secondary | ICD-10-CM | POA: Diagnosis not present

## 2020-05-13 DIAGNOSIS — G629 Polyneuropathy, unspecified: Secondary | ICD-10-CM

## 2020-05-13 DIAGNOSIS — R61 Generalized hyperhidrosis: Secondary | ICD-10-CM | POA: Diagnosis not present

## 2020-05-13 DIAGNOSIS — E1140 Type 2 diabetes mellitus with diabetic neuropathy, unspecified: Secondary | ICD-10-CM

## 2020-05-13 DIAGNOSIS — I10 Essential (primary) hypertension: Secondary | ICD-10-CM | POA: Diagnosis not present

## 2020-05-13 DIAGNOSIS — Z23 Encounter for immunization: Secondary | ICD-10-CM | POA: Diagnosis not present

## 2020-05-13 LAB — POCT GLYCOSYLATED HEMOGLOBIN (HGB A1C)
Est. average glucose Bld gHb Est-mCnc: 151
Hemoglobin A1C: 6.9 % — AB (ref 4.0–5.6)

## 2020-05-13 MED ORDER — ALUMINUM CHLORIDE 20 % EX SOLN: CUTANEOUS | 2 refills | Status: DC

## 2020-05-13 NOTE — Patient Instructions (Addendum)
.   Covid-19 vaccines: The Covid vaccines have been given to hundreds of millions of people and found to be very effective and are as safe as any other vaccine.  The risk of dying from Covid infections is much higher than having a serious reaction to the vaccine.  I strongly recommend getting fully vaccinated against Covid-19. You can schedule this at [facility] by calling [phone]

## 2020-05-13 NOTE — Progress Notes (Addendum)
Established patient visit   Patient: Taylor [name]   DOB: 04-Oct-1955   64 y.o. Male  MRN: [id number] Visit Date: 05/13/2020  Today's healthcare provider: Lelon Huh, MD   Chief Complaint  Patient presents with  . Diabetes  . Hypertension   Subjective    HPI  Diabetes Mellitus Type II, Follow-up  Lab Results  Component Value Date   HGBA1C 6.9 (A) 05/13/2020   HGBA1C 6.9 (A) 01/30/2020   HGBA1C 6.9 (H) 09/25/2019   Wt Readings from Last 3 Encounters:  05/13/20 (!) 364 lb (165.1 kg)  04/12/20 (!) 356 lb (161.5 kg)  04/05/20 (!) 355 lb (161 kg)   Last seen for diabetes 3 months ago.  Management since then includes continue same medication. He reports good compliance with treatment. He is not having side effects.  Symptoms: No fatigue No foot ulcerations  No appetite changes No nausea  No paresthesia of the feet  No polydipsia  No polyuria No visual disturbances   No vomiting     Home blood sugar records: fasting range: 100-125  Episodes of hypoglycemia? No    Current insulin regiment: none Most Recent Eye Exam: not UTD Current exercise: none Current diet habits: in general, an "unhealthy" diet  Pertinent Labs: Lab Results  Component Value Date   CHOL 140 09/25/2019   HDL 46 09/25/2019   LDLCALC 75 09/25/2019   TRIG 106 09/25/2019   CHOLHDL 3.0 09/25/2019   Lab Results  Component Value Date   NA 137 03/25/2020   K 4.2 03/25/2020   CREATININE 1.25 03/25/2020   GFRNONAA 61 03/25/2020   GFRAA 71 03/25/2020   GLUCOSE 111 (H) 03/25/2020     ---------------------------------------------------------------------------------------------------  Hypertension, follow-up  BP Readings from Last 3 Encounters:  05/13/20 134/82  04/12/20 112/86  04/05/20 111/69   Wt Readings from Last 3 Encounters:  05/13/20 (!) 364 lb (165.1 kg)  04/12/20 (!) 356 lb (161.5 kg)  04/05/20 (!) 355 lb (161 kg)     He was last seen for hypertension 3 months ago.   BP at that visit was 136/96. Management since that visit includes continue same medication.  He reports good compliance with treatment. He is not having side effects.  He is following a Regular diet. He is not exercising. He does not smoke.  Use of agents associated with hypertension: none.   Outside blood pressures are not checked.   --------------------------------------------------------------------------------------------------- He also complaints that he has persistent heavy sweating only in lower abdomen and suprapubic area. He has tried OTC talcum power with absorbs sweats, but is very messy and doesn't reduce it.      Medications: Outpatient Medications Prior to Visit  Medication Sig  . apixaban (ELIQUIS) 5 MG TABS tablet Take 1 tablet (5 mg total) by mouth 2 (two) times daily.  . dapagliflozin propanediol (FARXIGA) 10 MG TABS tablet Take 1 tablet (10 mg total) by mouth daily before breakfast.  . glipiZIDE (GLUCOTROL XL) 10 MG 24 hr tablet Take 1 tablet (10 mg total) by mouth daily with breakfast.  . glucose blood (ONETOUCH ULTRA) test strip Use to check sugar daily for type 2 diabetes E11.9  . HYDROmorphone (DILAUDID) 4 MG tablet Take 4 mg by mouth in the morning, at noon, in the evening, and at bedtime. Must be name brand  . lisinopril (ZESTRIL) 10 MG tablet TAKE 1 TABLET BY MOUTH ONCE DAILY. (Patient taking differently: Take 10 mg by mouth daily. )  . metFORMIN (GLUCOPHAGE) 1000  MG tablet TAKE 1 TABLET BY MOUTH TWICE DAILY (Patient taking differently: Take 1,000 mg by mouth 2 (two) times daily with a meal. )  . NARCAN 4 MG/0.1ML LIQD nasal spray kit Place 0.4 mg into the nose once.   . OXYCONTIN 20 MG 12 hr tablet Take 20 mg by mouth See admin instructions. Take 20 mg by mouth 12 noon and 2400  . OXYCONTIN 30 MG 12 hr tablet Take 30 mg by mouth See admin instructions. Take 30 mg by mouth at 0500 and 1700  . pioglitazone (ACTOS) 45 MG tablet TAKE 1 TABLET BY MOUTH ONCE  DAILY. (Patient taking differently: Take 45 mg by mouth daily at 12 noon. )  . potassium chloride SA (KLOR-CON) 20 MEQ tablet Take 1 tablet (20 mEq total) by mouth daily.  . pregabalin (LYRICA) 100 MG capsule Take 100 mg by mouth 3 (three) times daily.   . simvastatin (ZOCOR) 40 MG tablet TAKE 1 TABLET BY MOUTH ONCE DAILY. (Patient taking differently: Take 40 mg by mouth daily at 12 noon. )  . torsemide (DEMADEX) 20 MG tablet Take 2 tablets (40 mg total) by mouth daily. (Patient taking differently: Take 20 mg by mouth 2 (two) times daily. Morning and at lunch)  . vitamin B-12 (CYANOCOBALAMIN) 1000 MCG tablet Take 1,000 mcg by mouth daily.  Marland Kitchen zolpidem (AMBIEN CR) 12.5 MG CR tablet Take 12.5 mg by mouth at bedtime.   Marland Kitchen aspirin EC 81 MG tablet Take 81 mg by mouth daily. Swallow whole. (Patient not taking: Reported on 05/13/2020)  . metoprolol succinate (TOPROL-XL) 100 MG 24 hr tablet Take 1 tablet (100 mg total) by mouth daily. Take with or immediately following a meal.   No facility-administered medications prior to visit.    Review of Systems  Constitutional: Negative for appetite change, chills and fever.  Respiratory: Negative for chest tightness, shortness of breath and wheezing.   Cardiovascular: Negative for chest pain and palpitations.  Gastrointestinal: Negative for abdominal pain, nausea and vomiting.      Objective    BP 134/82 (BP Location: Left Arm, Patient Position: Sitting, Cuff Size: Large)   Pulse (!) 111   Temp 98.4 F (36.9 C) (Oral)   Resp 20   Wt (!) 364 lb (165.1 kg)   BMI 49.37 kg/m    Physical Exam   General: Appearance:    Obese male in no acute distress  Eyes:    PERRL, conjunctiva/corneas clear, EOM's intact       Lungs:     Clear to auscultation bilaterally, respirations unlabored  Heart:    Tachycardic. Irregularly irregular rhythm. No murmurs, rubs, or gallops.   MS:   All extremities are intact.   Neurologic:   Awake, alert, oriented x 3. No apparent  focal neurological           defect.         Results for orders placed or performed in visit on 05/13/20  POCT HgB A1C  Result Value Ref Range   Hemoglobin A1C 6.9 (A) 4.0 - 5.6 %   Est. average glucose Bld gHb Est-mCnc 151     Assessment & Plan     1. Type 2 diabetes mellitus with diabetic neuropathy, with long-term current use of insulin (HCC) Well controlled.  Continue current medications.    2. Hypertension, essential, benign Well controlled.  Continue current medications.    3. Segmental hyperhidrosis try- aluminum chloride (DRYSOL) 20 % external solution; Apply to affected area daily at  bedtime  Dispense: 35 mL; Refill: 2  4. Need for influenza vaccination  - Flu Vaccine QUAD 36+ mos IM (Fluarix/Fluzone)   Extensively counseled on the benefits and recommendation for covid vaccination which he declined today. He did state he would call us if he decides to get it.    Follow up and CPE in Six months.   5. Neuropathy He was previously advised to try OTC vitamin B12 and alpha lipoic acid due to burning in feet and states that is much better now.      The entirety of the information documented in the History of Present Illness, Review of Systems and Physical Exam were personally obtained by me. Portions of this information were initially documented by the CMA and reviewed by me for thoroughness and accuracy.     Lelon Huh, MD  [facility] [phone] (phone) [phone] (fax)  [place]

## 2020-05-14 ENCOUNTER — Ambulatory Visit: Payer: Self-pay | Admitting: Family Medicine

## 2020-05-15 ENCOUNTER — Ambulatory Visit: Payer: Self-pay | Admitting: Family Medicine

## 2020-05-16 ENCOUNTER — Telehealth: Payer: Self-pay

## 2020-05-16 ENCOUNTER — Ambulatory Visit: Payer: Medicare Other | Admitting: Cardiology

## 2020-05-16 NOTE — Telephone Encounter (Signed)
  Chronic Care Management   Outreach Note  05/16/2020 Name: Taylor [name] MRN: [id number] DOB: [birth year]  Primary Care Provider: Malva Limes, MD Reason for referral : Chronic Care Management    An unsuccessful telephone outreach was attempted today. Mr. Tomkins referred to the case management team for assistance with care management and care coordination.    Follow Up Plan:  A HIPAA compliant voice message was left today requesting a return call.    [facility] Care [facility] [phone]

## 2020-05-20 ENCOUNTER — Telehealth: Payer: Self-pay | Admitting: Family Medicine

## 2020-05-20 NOTE — Telephone Encounter (Signed)
Copied from CRM [id number]. Topic: Medicare AWV >> May 20, 2020 11:28 AM Claudette Laws R wrote: Reason for CRM:  Left message for patient to call back and schedule Medicare Annual Wellness Visit (AWV) in office.   If not able to come in office, please offer to do virtually.   Last AWV 05/22/2019   Please schedule at anytime with [facility] Advisor.  If any questions, please contact me at [phone]

## 2020-05-22 ENCOUNTER — Ambulatory Visit: Payer: Medicare Other | Admitting: Dietician

## 2020-05-30 ENCOUNTER — Ambulatory Visit: Payer: Self-pay

## 2020-05-30 NOTE — Chronic Care Management (AMB) (Signed)
  Chronic Care Management   Outreach Note  05/30/2020 Name: TREMEL SETTERS MRN: [id number] DOB: [birth year]  Primary Care Provider: Malva Limes, MD Reason for referral : Chronic Care Management   Mr. Schulenburg was referred to the care management team for assistance with chronic care management and care coordination. His primary care provider will be notified of our unsuccessful attempts to establish and maintain contact. The care management team will gladly outreach at any time in the future if he is interested in receiving assistance.   Follow Up Plan:  The care management team will gladly follow up with Mr Cudmore after the primary care provider has a conversation with him regarding recommendation for care management engagement and subsequent re-referral for care management services.    [facility] Care Management [facility] [phone]

## 2020-06-11 ENCOUNTER — Telehealth: Payer: Self-pay

## 2020-06-11 DIAGNOSIS — Z794 Long term (current) use of insulin: Secondary | ICD-10-CM

## 2020-06-11 DIAGNOSIS — E1140 Type 2 diabetes mellitus with diabetic neuropathy, unspecified: Secondary | ICD-10-CM

## 2020-06-11 NOTE — Telephone Encounter (Signed)
Copied from CRM [id number]. Topic: Quick Communication - See Telephone Encounter >> Jun 10, 2020  5:03 PM Aretta Nip wrote: CRM for notification. See Telephone encounter for: 06/10/20.dapagliflozin propanediol (FARXIGA) 10 MG TABS tablet Medication Date: 05/08/2020 Department: [facility] Ordering/Authorizing: Malva Limes, MD  Pt [phone] Pt is wanting to pu samples pls FU

## 2020-06-11 NOTE — Telephone Encounter (Signed)
He can have one months supply

## 2020-06-11 NOTE — Telephone Encounter (Signed)
Patient called requesting samples of Farxiga 10mg . Please advise if ok to give samples. We have 4 boxes available.

## 2020-06-12 MED ORDER — DAPAGLIFLOZIN PROPANEDIOL 10 MG PO TABS: 10.0000 mg | ORAL_TABLET | Freq: Every day | ORAL | 0 refills | Status: DC

## 2020-06-12 NOTE — Telephone Encounter (Signed)
Samples placed at front desk for pick up. I tried calling patient. Left message to call back. OK for [facility] to advise patient that samples are ready for pick up.

## 2020-06-13 NOTE — Telephone Encounter (Signed)
Pt is aware samples are up front. 

## 2020-07-01 ENCOUNTER — Encounter: Payer: Self-pay | Admitting: Dietician

## 2020-07-01 ENCOUNTER — Other Ambulatory Visit: Payer: Self-pay

## 2020-07-01 ENCOUNTER — Encounter: Payer: Medicare Other | Attending: Family Medicine | Admitting: Dietician

## 2020-07-01 VITALS — Wt 359.8 lb

## 2020-07-01 DIAGNOSIS — Z6841 Body Mass Index (BMI) 40.0 and over, adult: Secondary | ICD-10-CM | POA: Diagnosis not present

## 2020-07-01 DIAGNOSIS — E1165 Type 2 diabetes mellitus with hyperglycemia: Secondary | ICD-10-CM | POA: Insufficient documentation

## 2020-07-01 DIAGNOSIS — E119 Type 2 diabetes mellitus without complications: Secondary | ICD-10-CM

## 2020-07-01 NOTE — Progress Notes (Signed)
Medical Nutrition Therapy: Visit start time: 1030  end time: 1130  Assessment:  Diagnosis: obesity, Type 2 DM Past medical history: sleep apnea -- waiting to receive C-PAP  Psychosocial issues/ stress concerns: none  Preferred learning method:  . Visual   Current weight: 359.8lbs  Height: 6'0" Medications, supplements: reconciled list in medical record  Progress and evaluation:   Patient repots history of 4 back surgeries, and still has pain when exercising, so activity has decreased.    He does test BGs at home sporadically, fasting 4x a week, and occasionally later in the day 100-125 recently.  He had some relatives with uncontrolled diabetes and serious complications, and wants to avoid similar issues.  He attended diabetes classes after initially diagnosed.  Lives with mother, he does most of the grocery shopping and they share meal prep  His personal goal weight is 230lbs, was last at that weight in 2001.  Experiencing constipation with pain meds.   Has dentures, limits hard textured foods such as whole apples.  Physical activity: occasional 30 minutes  Dietary Intake:  Usual eating pattern includes 2-3 meals and 3-4 snacks per day. Dining out frequency: 3-4 meals per week.  At times patient is unable to sleep until early morning hours, so will then sleep later and skip breakfast Breakfast: sausage biscuit, eggs, cereal ie rice krispies does not add sugar Snack: same as pm Lunch: sandwich with / burger/ crackers and cheese Snack: apple/ pear/ tangerine or mandarin oranges/ occ pop tart unfrosted Supper: chicken ie chicken casserole/ fish from frozen with fries and slaw; pinto beans with cheese and salsa + potatoes Snack: same as pm Beverages: bottled water, some with sugar free flavoring; 20-24oz milk every 3-4 days; regular soda 2L lasts 2-3 days does not like diet drinks  Nutrition Care Education: Topics covered:  Basic nutrition: basic food groups, appropriate  nutrient balance, appropriate meal and snack schedule, general nutrition guidelines; benefits of whole grains, beans, veg and fruits  Weight control: importance of low sugar and low fat choices; appropriate food portions; roles of sleep and exercise Diabetes: appropriate meal and snack schedule; appropriate carb intake and balance, healthy carb choices; role of fiber, protein Other: increasing vegetable and fruit intake for anti-inflammatory properties as well as fiber and fluid to reduce constipation  Nutritional Diagnosis:  [place]-2.2 Altered nutrition-related laboratory As related to type 2 diabetes.  As evidenced by pateint with recent HbA1C of 6.9%. [place]-3.3 Overweight/obesity As related to excess calories and limited activity, inadequate or disrupted sleep.  As evidenced by patient with current BMI of 48.8.  Intervention:  . Instruction and discussion as noted above. . Patient voices readiness to make diet and lifestyle changes to lose weight and control BGs.  . Patient feels improved sleep with CPAP will make other lifestyle changes easier.  . Established nutrition goals with input from patient. . No follow-up scheduled at this time due to travel distance for patient; he will schedule later if needed.  Education Materials given:  . General diet guidelines for Diabetes . Plate Planner with food lists, sample meal pattern . sample menus . Visit summary with goals/ instructions   Learner/ who was taught:  . Patient   Level of understanding: Marland Kitchen Verbalizes/ demonstrates competency  Demonstrated degree of understanding via:   Teach back Learning barriers: . Hearing impairment-- hard of hearing  Willingness to learn/ readiness for change: . Acceptance, ready for change . Does not want to eliminate regular coca-cola at this time.   Monitoring  and Evaluation:  Dietary intake, exercise, BG control, sleep habits, and body weight      follow up: prn

## 2020-07-01 NOTE — Patient Instructions (Signed)
   Avoid drinking coca cola at night, caffeine can keep you from sleeping. Try sparkling water with sugar free flavoring. Try to make the 2-liter bottle last as long as possible.   When drinking milk, keep to a small glass to control sugar and calories.  Have a snack of some whole grain crackers like triscuits with lowfat cheese, or graham crackers with a small amount of peanut butter before going to bed to keep blood sugar steady during the night.  Eat vegetables and/or fruit with each meal to help feel satisfied on less calories and help fight inflammation related pain.

## 2020-07-09 ENCOUNTER — Telehealth: Payer: Self-pay

## 2020-07-09 DIAGNOSIS — E1140 Type 2 diabetes mellitus with diabetic neuropathy, unspecified: Secondary | ICD-10-CM

## 2020-07-09 DIAGNOSIS — Z794 Long term (current) use of insulin: Secondary | ICD-10-CM

## 2020-07-09 NOTE — Telephone Encounter (Signed)
Copied from CRM [id number]. Topic: General - Other >> Jul 09, 2020 10:35 AM Gaetana Michaelis A wrote: Reason for CRM: Patient called to notify PCP that he is nearly out of dapagliflozin propanediol (FARXIGA) 10 MG TABS tablets. Patient believes they have enough to make it until Thursday 07/11/20

## 2020-07-10 ENCOUNTER — Telehealth: Payer: Self-pay | Admitting: Family Medicine

## 2020-07-10 MED ORDER — DAPAGLIFLOZIN PROPANEDIOL 10 MG PO TABS: 10.0000 mg | ORAL_TABLET | Freq: Every day | ORAL | 5 refills | Status: DC

## 2020-07-10 NOTE — Telephone Encounter (Signed)
Have sent prescription to his [facility] pharmacy. Let me know if it's still too expensive. copay may have come down with new year.

## 2020-07-10 NOTE — Addendum Note (Signed)
Addended by: Anson [place] on: 07/10/2020 11:29 AM   Modules accepted: Orders

## 2020-07-10 NOTE — Addendum Note (Signed)
Addended by: Malva Limes on: 07/10/2020 11:51 AM   Modules accepted: Orders

## 2020-07-10 NOTE — Telephone Encounter (Signed)
Please review. Thanks!  

## 2020-07-10 NOTE — Telephone Encounter (Signed)
Copied from CRM [id number]. Topic: Medicare AWV >> Jul 10, 2020  2:01 PM Claudette Laws R wrote: Reason for CRM:  Left message for patient to call back and schedule Medicare Annual Wellness Visit (AWV) in office.   If not able to come in office, please offer to do virtually or by telephone.   Last AWV 05/22/2019  Please schedule at anytime with [facility] Health Advisor.  If any questions, please contact me at [phone]

## 2020-07-10 NOTE — Telephone Encounter (Signed)
Advised patient as below.  

## 2020-07-10 NOTE — Telephone Encounter (Signed)
LMTCB

## 2020-07-29 ENCOUNTER — Other Ambulatory Visit: Payer: Self-pay | Admitting: Family Medicine

## 2020-07-30 ENCOUNTER — Other Ambulatory Visit: Payer: Self-pay | Admitting: *Deleted

## 2020-07-30 MED ORDER — METOPROLOL SUCCINATE ER 100 MG PO TB24: 100.0000 mg | ORAL_TABLET | Freq: Every day | ORAL | 0 refills | Status: DC

## 2020-08-08 ENCOUNTER — Telehealth: Payer: Self-pay

## 2020-08-08 ENCOUNTER — Telehealth: Payer: Self-pay | Admitting: Adult Health

## 2020-08-08 ENCOUNTER — Encounter: Payer: Self-pay | Admitting: Cardiology

## 2020-08-08 ENCOUNTER — Other Ambulatory Visit: Payer: Self-pay

## 2020-08-08 ENCOUNTER — Ambulatory Visit: Payer: Medicare Other | Admitting: Cardiology

## 2020-08-08 VITALS — BP 104/60 | HR 104 | Ht 72.0 in | Wt 358.0 lb

## 2020-08-08 DIAGNOSIS — I4891 Unspecified atrial fibrillation: Secondary | ICD-10-CM

## 2020-08-08 DIAGNOSIS — I10 Essential (primary) hypertension: Secondary | ICD-10-CM

## 2020-08-08 DIAGNOSIS — I2510 Atherosclerotic heart disease of native coronary artery without angina pectoris: Secondary | ICD-10-CM | POA: Diagnosis not present

## 2020-08-08 DIAGNOSIS — I428 Other cardiomyopathies: Secondary | ICD-10-CM

## 2020-08-08 NOTE — Progress Notes (Signed)
Cardiology Office Note:    Date:  08/08/2020   ID:  Taylor [name], DOB 1956-01-11, MRN [id number]  PCP:  Birdie Sons, MD  Cardiologist:  Kate Sable, MD  Electrophysiologist:  None   Referring MD: Birdie Sons, MD   Chief Complaint  Patient presents with  . Other    1 month f/u no complaint today. Meds reviewed verbally with pt.    History of Present Illness:    Taylor [name] is a 65 y.o. male with a hx of hypertension, CAD (60% mLAD, 30% D2), diabetes, atrial fibrillation, NICM, EF 30 to 35%, OSA who presents for follow-up.    He is being seen for atrial fibrillation and nonischemic cardiomyopathy.  He takes all his medications as prescribed except to occasionally missing potassium supplements.  Has noticed leg cramping occasionally over the past several weeks.  Diagnosed with sleep apnea, still waiting for CPAP device.  Denies edema, palpitations.  Prior notes Echocardiogram 12/2019 showed moderate to severely reduced EF, EF 30 to 35%. Left heart cath 03/2020 mid LAD 60% stenosis, second diagonal 30% stenosis.   Past Medical History:  Diagnosis Date  . Arthritis   . Broken leg 02/07/2019  . Depression   . Diabetes mellitus without complication ([place])   . Hypertension     Past Surgical History:  Procedure Laterality Date  . BACK SURGERY    . LEFT HEART CATH AND CORONARY ANGIOGRAPHY Left 04/05/2020   Procedure: LEFT HEART CATH AND CORONARY ANGIOGRAPHY;  Surgeon: Wellington Hampshire, MD;  Location: [facility];  Service: Cardiovascular;  Laterality: Left;    Current Medications: Current Meds  Medication Sig  . aluminum chloride (DRYSOL) 20 % external solution Apply to affected area daily at bedtime  . apixaban (ELIQUIS) 5 MG TABS tablet Take 1 tablet (5 mg total) by mouth 2 (two) times daily.  . dapagliflozin propanediol (FARXIGA) 10 MG TABS tablet Take 1 tablet (10 mg total) by mouth daily before breakfast.  . glipiZIDE (GLUCOTROL XL) 10  MG 24 hr tablet Take 1 tablet (10 mg total) by mouth daily with breakfast.  . glucose blood (ONETOUCH ULTRA) test strip Use to check sugar daily for type 2 diabetes E11.9  . HYDROmorphone (DILAUDID) 4 MG tablet Take 4 mg by mouth in the morning, at noon, in the evening, and at bedtime. Must be name brand  . lisinopril (ZESTRIL) 10 MG tablet TAKE 1 TABLET BY MOUTH ONCE DAILY.  . metFORMIN (GLUCOPHAGE) 1000 MG tablet Take 1 tablet (1,000 mg total) by mouth 2 (two) times daily with a meal.  . metoprolol succinate (TOPROL-XL) 100 MG 24 hr tablet Take 1 tablet (100 mg total) by mouth daily. Take with or immediately following a meal.  . NARCAN 4 MG/0.1ML LIQD nasal spray kit Place 0.4 mg into the nose once.   . OXYCONTIN 20 MG 12 hr tablet Take 20 mg by mouth See admin instructions. Take 20 mg by mouth 12 noon and 2400  . OXYCONTIN 30 MG 12 hr tablet Take 30 mg by mouth See admin instructions. Take 30 mg by mouth at 0500 and 1700  . pioglitazone (ACTOS) 45 MG tablet TAKE 1 TABLET BY MOUTH ONCE DAILY.  Marland Kitchen potassium chloride SA (KLOR-CON) 20 MEQ tablet Take 1 tablet (20 mEq total) by mouth daily.  . pregabalin (LYRICA) 100 MG capsule Take 100 mg by mouth 3 (three) times daily.   . simvastatin (ZOCOR) 40 MG tablet TAKE 1 TABLET BY MOUTH ONCE  DAILY.  . torsemide (DEMADEX) 20 MG tablet Take 20 mg by mouth 2 (two) times daily.  . vitamin B-12 (CYANOCOBALAMIN) 1000 MCG tablet Take 1,000 mcg by mouth daily.  Marland Kitchen zolpidem (AMBIEN CR) 12.5 MG CR tablet Take 12.5 mg by mouth at bedtime.      Allergies:   Other   Social History   Socioeconomic History  . Marital status: Single    Spouse name: Not on file  . Number of children: 1  . Years of education: Not on file  . Highest education level: Some college, no degree  Occupational History  . Occupation: disability/retired  Tobacco Use  . Smoking status: Never Smoker  . Smokeless tobacco: Never Used  Vaping Use  . Vaping Use: Never used  Substance and  Sexual Activity  . Alcohol use: No    Alcohol/week: 0.0 standard drinks  . Drug use: No  . Sexual activity: Not on file  Other Topics Concern  . Not on file  Social History Narrative   Lives with Mother Taylor [name].    Social Determinants of Health   Financial Resource Strain: Not on file  Food Insecurity: Not on file  Transportation Needs: Not on file  Physical Activity: Not on file  Stress: Not on file  Social Connections: Not on file     Family History: The patient's family history includes Diabetes in his mother and sister.  ROS:   Please see the history of present illness.     All other systems reviewed and are negative.  EKGs/Labs/Other Studies Reviewed:    The following studies were reviewed today:   EKG:  EKG not ordered today.    Recent Labs: 12/07/2019: ALT 9 03/25/2020: BUN 22; Creatinine, Ser 1.25; Hemoglobin 12.7; Platelets 206; Potassium 4.2; Sodium 137  Recent Lipid Panel    Component Value Date/Time   CHOL 140 09/25/2019 1509   TRIG 106 09/25/2019 1509   HDL 46 09/25/2019 1509   CHOLHDL 3.0 09/25/2019 1509   LDLCALC 75 09/25/2019 1509    Physical Exam:    VS:  BP 104/60 (BP Location: Left Arm, Patient Position: Sitting, Cuff Size: Large)   Pulse (!) 104   Ht 6' (1.829 m)   Wt (!) 358 lb (162.4 kg)   SpO2 98%   BMI 48.55 kg/m     Wt Readings from Last 3 Encounters:  08/08/20 (!) 358 lb (162.4 kg)  07/01/20 (!) 359 lb 12.8 oz (163.2 kg)  05/13/20 (!) 364 lb (165.1 kg)     GEN:  Well nourished, well developed in no acute distress HEENT: Normal NECK: No JVD; No carotid bruits LYMPHATICS: No lymphadenopathy CARDIAC: Irregular irregular, non tachycardic, no murmurs, rubs, gallops RESPIRATORY:  Clear to auscultation without rales, wheezing or rhonchi  ABDOMEN: Soft, non-tender, non-distended MUSCULOSKELETAL:  trace edema; No deformity  SKIN: Warm and dry NEUROLOGIC:  Alert and oriented x 3 PSYCHIATRIC:  Normal affect   ASSESSMENT:    1.  Atrial fibrillation, unspecified type ([place])   2. NICM (nonischemic cardiomyopathy) ([place])   3. Coronary artery disease involving native coronary artery of native heart without angina pectoris   4. Essential hypertension    PLAN:    In order of problems listed above:  1. Patient persistent atrial fibrillation, heart rate controlled.  CHA2DS2-VASc score at 3 (chf,htn. DM).  Continue toprol xl 131m qd, Eliquis 534mtwice daily.  We will schedule DC cardioversion. 2. NICM, EF 30-35%.  NYHA class II-III symptoms. Cont Toprol-XL 10031mcontinue lisinopril  10 mg daily, torsemide 40 mg daily, Farxiga 10 mg daily.  Low normal blood pressures preventing addition or up titration of CHF meds.  Arrhythmia/A. fib could be contributing.  Check BMP/potassium levels due to cramping.  Patient advised to take potassium supplements as prescribed. 3. Nonobstructive CAD, 60% mid LAD, 30% diagonal.  Continue Eliquis, statin. 4. Patient with history of hypertension.  BP controlled.  Follow-up after DC cardioversion  Total encounter time 40  minutes  Greater than 50% was spent in counseling and coordination of care with the patient   This note was generated in part or whole with voice recognition software. Voice recognition is usually quite accurate but there are transcription errors that can and very often do occur. I apologize for any typographical errors that were not detected and corrected.  Medication Adjustments/Labs and Tests Ordered: Current medicines are reviewed at length with the patient today.  Concerns regarding medicines are outlined above.  Orders Placed This Encounter  Procedures  . Basic metabolic panel  . CBC  . EKG 12-Lead   No orders of the defined types were placed in this encounter.   Patient Instructions  Medication Instructions:  Your physician recommends that you continue on your current medications as directed. Please refer to the Current Medication list given to you today.  *If  you need a refill on your cardiac medications before your next appointment, please call your pharmacy*    Lab Work:  CBC, BMP to be drawn today.   Testing/Procedures:  1.  You are scheduled for a Cardioversion on ________________ with Dr._Agbor-Etang__________ Please arrive at the [place] [facility] at _________ a.m. on the day of your procedure.  DIET INSTRUCTIONS:  Nothing to eat or drink after midnight except your medications with a small sip of water.         1) Labs: ___Already drawn in office_______________  2) Medications:  YOU MAY TAKE ALL of your remaining medications with a small amount of water.  3) Must have a responsible person to drive you home.  4) Bring a current list of your medications and current insurance cards.    If you have any questions after you get home, please call the office at [place]- TEST: You will need a COVID TEST prior to the procedure:             LOCATION: [place] Pre-Op Admission Drive-Thru Testing site.             DATE/TIME:  Monday 08/12/20  (anytime between 8 am and 1 pm)   Follow-Up: At [facility], you and your health needs are our priority.  As part of our continuing mission to provide you with exceptional heart care, we have created designated Provider Care Teams.  These Care Teams include your primary Cardiologist (physician) and Advanced Practice Providers (APPs -  Physician Assistants and Nurse Practitioners) who all work together to provide you with the care you need, when you need it.  We recommend signing up for the patient portal called "MyChart".  Sign up information is provided on this After Visit Summary.  MyChart is used to connect with patients for Virtual Visits (Telemedicine).  Patients are able to view lab/test results, encounter notes, upcoming appointments, etc.  Non-urgent messages can be sent to your provider as well.   To learn more about what you can do with MyChart, go to  [email or web address].    Your next appointment:   2 week(s)  The format  for your next appointment:   In Person  Provider:   Kate Sable, MD   Other Instructions      Signed, Kate Sable, MD  08/08/2020 11:22 AM    [place]

## 2020-08-08 NOTE — Patient Instructions (Addendum)
Medication Instructions:  Your physician recommends that you continue on your current medications as directed. Please refer to the Current Medication list given to you today.  *If you need a refill on your cardiac medications before your next appointment, please call your pharmacy*    Lab Work:  CBC, BMP to be drawn today.   Testing/Procedures:  1.  You are scheduled for a Cardioversion on ________________ with Dr._Agbor-Etang__________ [facility] at _________ a.m. on the day of your procedure.  DIET INSTRUCTIONS:  Nothing to eat or drink after midnight except your medications with a small sip of water.         1) Labs: ___Already drawn in office_______________  2) Medications:  YOU MAY TAKE ALL of your remaining medications with a small amount of water.  3) Must have a responsible person to drive you home.  4) Bring a current list of your medications and current insurance cards.    If you have any questions after you get home, please call the office at [phone]  COVID PRE- TEST: You will need a COVID TEST prior to the procedure:             LOCATION: [facility]-Thru Testing site.             DATE/TIME:  Monday 08/12/20  (anytime between 8 am and 1 pm)   Follow-Up: At [facility], you and your health needs are our priority.  As part of our continuing mission to provide you with exceptional heart care, we have created designated Provider Care Teams.  These Care Teams include your primary Cardiologist (physician) and Advanced Practice Providers (APPs -  Physician Assistants and Nurse Practitioners) who all work together to provide you with the care you need, when you need it.  We recommend signing up for the patient portal called "MyChart".  Sign up information is provided on this After Visit Summary.  MyChart is used to connect with patients for Virtual Visits (Telemedicine).  Patients are able to view lab/test  results, encounter notes, upcoming appointments, etc.  Non-urgent messages can be sent to your provider as well.   To learn more about what you can do with MyChart, go to [email or web address].    Your next appointment:   2 week(s)  The format for your next appointment:   In Person  Provider:   Debbe Odea, MD   Other Instructions

## 2020-08-08 NOTE — H&P (View-Only) (Signed)
Cardiology Office Note:    Date:  08/08/2020   ID:  Taylor [name], DOB 1956-01-11, MRN [id number]  PCP:  Birdie Sons, MD  Cardiologist:  Kate Sable, MD  Electrophysiologist:  None   Referring MD: Birdie Sons, MD   Chief Complaint  Patient presents with  . Other    1 month f/u no complaint today. Meds reviewed verbally with pt.    History of Present Illness:    Taylor [name] is a 65 y.o. male with a hx of hypertension, CAD (60% mLAD, 30% D2), diabetes, atrial fibrillation, NICM, EF 30 to 35%, OSA who presents for follow-up.    He is being seen for atrial fibrillation and nonischemic cardiomyopathy.  He takes all his medications as prescribed except to occasionally missing potassium supplements.  Has noticed leg cramping occasionally over the past several weeks.  Diagnosed with sleep apnea, still waiting for CPAP device.  Denies edema, palpitations.  Prior notes Echocardiogram 12/2019 showed moderate to severely reduced EF, EF 30 to 35%. Left heart cath 03/2020 mid LAD 60% stenosis, second diagonal 30% stenosis.   Past Medical History:  Diagnosis Date  . Arthritis   . Broken leg 02/07/2019  . Depression   . Diabetes mellitus without complication ([place])   . Hypertension     Past Surgical History:  Procedure Laterality Date  . BACK SURGERY    . LEFT HEART CATH AND CORONARY ANGIOGRAPHY Left 04/05/2020   Procedure: LEFT HEART CATH AND CORONARY ANGIOGRAPHY;  Surgeon: Wellington Hampshire, MD;  Location: [facility];  Service: Cardiovascular;  Laterality: Left;    Current Medications: Current Meds  Medication Sig  . aluminum chloride (DRYSOL) 20 % external solution Apply to affected area daily at bedtime  . apixaban (ELIQUIS) 5 MG TABS tablet Take 1 tablet (5 mg total) by mouth 2 (two) times daily.  . dapagliflozin propanediol (FARXIGA) 10 MG TABS tablet Take 1 tablet (10 mg total) by mouth daily before breakfast.  . glipiZIDE (GLUCOTROL XL) 10  MG 24 hr tablet Take 1 tablet (10 mg total) by mouth daily with breakfast.  . glucose blood (ONETOUCH ULTRA) test strip Use to check sugar daily for type 2 diabetes E11.9  . HYDROmorphone (DILAUDID) 4 MG tablet Take 4 mg by mouth in the morning, at noon, in the evening, and at bedtime. Must be name brand  . lisinopril (ZESTRIL) 10 MG tablet TAKE 1 TABLET BY MOUTH ONCE DAILY.  . metFORMIN (GLUCOPHAGE) 1000 MG tablet Take 1 tablet (1,000 mg total) by mouth 2 (two) times daily with a meal.  . metoprolol succinate (TOPROL-XL) 100 MG 24 hr tablet Take 1 tablet (100 mg total) by mouth daily. Take with or immediately following a meal.  . NARCAN 4 MG/0.1ML LIQD nasal spray kit Place 0.4 mg into the nose once.   . OXYCONTIN 20 MG 12 hr tablet Take 20 mg by mouth See admin instructions. Take 20 mg by mouth 12 noon and 2400  . OXYCONTIN 30 MG 12 hr tablet Take 30 mg by mouth See admin instructions. Take 30 mg by mouth at 0500 and 1700  . pioglitazone (ACTOS) 45 MG tablet TAKE 1 TABLET BY MOUTH ONCE DAILY.  Marland Kitchen potassium chloride SA (KLOR-CON) 20 MEQ tablet Take 1 tablet (20 mEq total) by mouth daily.  . pregabalin (LYRICA) 100 MG capsule Take 100 mg by mouth 3 (three) times daily.   . simvastatin (ZOCOR) 40 MG tablet TAKE 1 TABLET BY MOUTH ONCE  DAILY.  . torsemide (DEMADEX) 20 MG tablet Take 20 mg by mouth 2 (two) times daily.  . vitamin B-12 (CYANOCOBALAMIN) 1000 MCG tablet Take 1,000 mcg by mouth daily.  Marland Kitchen zolpidem (AMBIEN CR) 12.5 MG CR tablet Take 12.5 mg by mouth at bedtime.      Allergies:   Other   Social History   Socioeconomic History  . Marital status: Single    Spouse name: Not on file  . Number of children: 1  . Years of education: Not on file  . Highest education level: Some college, no degree  Occupational History  . Occupation: disability/retired  Tobacco Use  . Smoking status: Never Smoker  . Smokeless tobacco: Never Used  Vaping Use  . Vaping Use: Never used  Substance and  Sexual Activity  . Alcohol use: No    Alcohol/week: 0.0 standard drinks  . Drug use: No  . Sexual activity: Not on file  Other Topics Concern  . Not on file  Social History Narrative   Lives with Mother [name].    Social Determinants of Health   Financial Resource Strain: Not on file  Food Insecurity: Not on file  Transportation Needs: Not on file  Physical Activity: Not on file  Stress: Not on file  Social Connections: Not on file     Family History: The patient's family history includes Diabetes in his mother and sister.  ROS:   Please see the history of present illness.     All other systems reviewed and are negative.  EKGs/Labs/Other Studies Reviewed:    The following studies were reviewed today:   EKG:  EKG not ordered today.    Recent Labs: 12/07/2019: ALT 9 03/25/2020: BUN 22; Creatinine, Ser 1.25; Hemoglobin 12.7; Platelets 206; Potassium 4.2; Sodium 137  Recent Lipid Panel    Component Value Date/Time   CHOL 140 09/25/2019 1509   TRIG 106 09/25/2019 1509   HDL 46 09/25/2019 1509   CHOLHDL 3.0 09/25/2019 1509   LDLCALC 75 09/25/2019 1509    Physical Exam:    VS:  BP 104/60 (BP Location: Left Arm, Patient Position: Sitting, Cuff Size: Large)   Pulse (!) 104   Ht 6' (1.829 m)   Wt (!) 358 lb (162.4 kg)   SpO2 98%   BMI 48.55 kg/m     Wt Readings from Last 3 Encounters:  08/08/20 (!) 358 lb (162.4 kg)  07/01/20 (!) 359 lb 12.8 oz (163.2 kg)  05/13/20 (!) 364 lb (165.1 kg)     GEN:  Well nourished, well developed in no acute distress HEENT: Normal NECK: No JVD; No carotid bruits LYMPHATICS: No lymphadenopathy CARDIAC: Irregular irregular, non tachycardic, no murmurs, rubs, gallops RESPIRATORY:  Clear to auscultation without rales, wheezing or rhonchi  ABDOMEN: Soft, non-tender, non-distended MUSCULOSKELETAL:  trace edema; No deformity  SKIN: Warm and dry NEUROLOGIC:  Alert and oriented x 3 PSYCHIATRIC:  Normal affect   ASSESSMENT:    1.  Atrial fibrillation, unspecified type ([place])   2. NICM (nonischemic cardiomyopathy) ([place])   3. Coronary artery disease involving native coronary artery of native heart without angina pectoris   4. Essential hypertension    PLAN:    In order of problems listed above:  1. Patient persistent atrial fibrillation, heart rate controlled.  CHA2DS2-VASc score at 3 (chf,htn. DM).  Continue toprol xl 131m qd, Eliquis 534mtwice daily.  We will schedule DC cardioversion. 2. NICM, EF 30-35%.  NYHA class II-III symptoms. Cont Toprol-XL 10031mcontinue lisinopril  10 mg daily, torsemide 40 mg daily, Farxiga 10 mg daily.  Low normal blood pressures preventing addition or up titration of CHF meds.  Arrhythmia/A. fib could be contributing.  Check BMP/potassium levels due to cramping.  Patient advised to take potassium supplements as prescribed. 3. Nonobstructive CAD, 60% mid LAD, 30% diagonal.  Continue Eliquis, statin. 4. Patient with history of hypertension.  BP controlled.  Follow-up after DC cardioversion  Total encounter time 40  minutes  Greater than 50% was spent in counseling and coordination of care with the patient   This note was generated in part or whole with voice recognition software. Voice recognition is usually quite accurate but there are transcription errors that can and very often do occur. I apologize for any typographical errors that were not detected and corrected.  Medication Adjustments/Labs and Tests Ordered: Current medicines are reviewed at length with the patient today.  Concerns regarding medicines are outlined above.  Orders Placed This Encounter  Procedures  . Basic metabolic panel  . CBC  . EKG 12-Lead   No orders of the defined types were placed in this encounter.   Patient Instructions  Medication Instructions:  Your physician recommends that you continue on your current medications as directed. Please refer to the Current Medication list given to you today.  *If  you need a refill on your cardiac medications before your next appointment, please call your pharmacy*    Lab Work:  CBC, BMP to be drawn today.   Testing/Procedures:  1.  You are scheduled for a Cardioversion on ________________ with Dr._Agbor-Etang__________ Please arrive at the [facility] at _________ a.m. on the day of your procedure.  DIET INSTRUCTIONS:  Nothing to eat or drink after midnight except your medications with a small sip of water.         1) Labs: ___Already drawn in office_______________  2) Medications:  YOU MAY TAKE ALL of your remaining medications with a small amount of water.  3) Must have a responsible person to drive you home.  4) Bring a current list of your medications and current insurance cards.    If you have any questions after you get home, please call the office at [place]- TEST: You will need a COVID TEST prior to the procedure:             LOCATION: [facility] site.             DATE/TIME:  Monday 08/12/20  (anytime between 8 am and 1 pm)   Follow-Up: At [facility], you and your health needs are our priority.  As part of our continuing mission to provide you with exceptional heart care, we have created designated Provider Care Teams.  These Care Teams include your primary Cardiologist (physician) and Advanced Practice Providers (APPs -  Physician Assistants and Nurse Practitioners) who all work together to provide you with the care you need, when you need it.  We recommend signing up for the patient portal called "MyChart".  Sign up information is provided on this After Visit Summary.  MyChart is used to connect with patients for Virtual Visits (Telemedicine).  Patients are able to view lab/test results, encounter notes, upcoming appointments, etc.  Non-urgent messages can be sent to your provider as well.   To learn more about what you can do with MyChart, go to  [email or web address].    Your next appointment:   2 week(s)  The format  for your next appointment:   In Person  Provider:   Kate Sable, MD   Other Instructions      Signed, Kate Sable, MD  08/08/2020 11:22 AM    [place]

## 2020-08-08 NOTE — Telephone Encounter (Signed)
Received call from [place] in scheduling, patient has been scheduled for a Cardioversion on 08/14/20 at 0730. I called patient and informed him of this, and to arrive at 0630 at the [facility]. Patient verbalized understanding and agreed with plan.

## 2020-08-08 NOTE — Telephone Encounter (Signed)
I called and spoke with Lurena Joiner with [facility] and she states that they have spoke with the patient several times about the delay.  I ask her if she would call him with and update and try to explain that if he changes DME companies the process will have to start all over again and put him further down the list.  If the stays with [employer] at least he is on the list already

## 2020-08-09 ENCOUNTER — Encounter: Payer: Self-pay | Admitting: Pulmonary Disease

## 2020-08-09 LAB — CBC
Hematocrit: 39.2 % (ref 37.5–51.0)
Hemoglobin: 12.9 g/dL — ABNORMAL LOW (ref 13.0–17.7)
MCH: 28.5 pg (ref 26.6–33.0)
MCHC: 32.9 g/dL (ref 31.5–35.7)
MCV: 87 fL (ref 79–97)
Platelets: 208 10*3/uL (ref 150–450)
RBC: 4.52 x10E6/uL (ref 4.14–5.80)
RDW: 14.7 % (ref 11.6–15.4)
WBC: 8.4 10*3/uL (ref 3.4–10.8)

## 2020-08-09 LAB — BASIC METABOLIC PANEL
BUN/Creatinine Ratio: 15 (ref 10–24)
BUN: 18 mg/dL (ref 8–27)
CO2: 22 mmol/L (ref 20–29)
Calcium: 9.3 mg/dL (ref 8.6–10.2)
Chloride: 97 mmol/L (ref 96–106)
Creatinine, Ser: 1.18 mg/dL (ref 0.76–1.27)
Glucose: 180 mg/dL — ABNORMAL HIGH (ref 65–99)
Potassium: 4.2 mmol/L (ref 3.5–5.2)
Sodium: 137 mmol/L (ref 134–144)
eGFR: 69 mL/min/{1.73_m2} (ref >59)

## 2020-08-09 NOTE — Telephone Encounter (Signed)
I spoke with Archie Patten with [employer] this morning and she stated that Lurena Joiner called the patient yesterday but had to leave a message about the update on his Cpap machine

## 2020-08-12 ENCOUNTER — Other Ambulatory Visit: Admission: RE | Admit: 2020-08-12 | Payer: Medicare Other | Source: Ambulatory Visit

## 2020-08-12 ENCOUNTER — Other Ambulatory Visit: Payer: Self-pay | Admitting: Family Medicine

## 2020-08-12 DIAGNOSIS — E1165 Type 2 diabetes mellitus with hyperglycemia: Secondary | ICD-10-CM

## 2020-08-13 ENCOUNTER — Other Ambulatory Visit: Payer: Self-pay

## 2020-08-13 ENCOUNTER — Telehealth: Payer: Self-pay | Admitting: Cardiology

## 2020-08-13 ENCOUNTER — Other Ambulatory Visit
Admission: RE | Admit: 2020-08-13 | Discharge: 2020-08-13 | Disposition: A | Discharge status: Home / Self Care | Payer: Medicare Other | Source: Ambulatory Visit | Attending: Cardiology | Admitting: Cardiology

## 2020-08-13 DIAGNOSIS — Z01812 Encounter for preprocedural laboratory examination: Secondary | ICD-10-CM | POA: Insufficient documentation

## 2020-08-13 DIAGNOSIS — Z20822 Contact with and (suspected) exposure to covid-19: Secondary | ICD-10-CM | POA: Insufficient documentation

## 2020-08-13 NOTE — Telephone Encounter (Addendum)
Patient had informed me yesterday per result note that he had forgot to get a Covid test yesterday, and that he would be getting on today. I called to see if he had gone yet and stated that he was on his way.

## 2020-08-13 NOTE — Telephone Encounter (Signed)
Please call to discuss if patient is aware of COVID testing pre procedure.

## 2020-08-14 ENCOUNTER — Other Ambulatory Visit: Payer: Self-pay

## 2020-08-14 ENCOUNTER — Ambulatory Visit: Payer: Medicare Other | Admitting: Anesthesiology

## 2020-08-14 ENCOUNTER — Encounter
Admission: RE | Disposition: A | Discharge status: Home / Self Care | Payer: Self-pay | Source: Home / Self Care | Attending: Cardiology

## 2020-08-14 ENCOUNTER — Telehealth: Payer: Self-pay | Admitting: Cardiology

## 2020-08-14 ENCOUNTER — Ambulatory Visit
Admission: RE | Admit: 2020-08-14 | Discharge: 2020-08-14 | Disposition: A | Discharge status: Home / Self Care | Payer: Medicare Other | Attending: Cardiology | Admitting: Cardiology

## 2020-08-14 ENCOUNTER — Encounter: Payer: Self-pay | Admitting: Cardiology

## 2020-08-14 DIAGNOSIS — Z7984 Long term (current) use of oral hypoglycemic drugs: Secondary | ICD-10-CM | POA: Diagnosis not present

## 2020-08-14 DIAGNOSIS — E119 Type 2 diabetes mellitus without complications: Secondary | ICD-10-CM | POA: Diagnosis not present

## 2020-08-14 DIAGNOSIS — I5022 Chronic systolic (congestive) heart failure: Secondary | ICD-10-CM | POA: Diagnosis not present

## 2020-08-14 DIAGNOSIS — I4819 Other persistent atrial fibrillation: Secondary | ICD-10-CM | POA: Insufficient documentation

## 2020-08-14 DIAGNOSIS — E7800 Pure hypercholesterolemia, unspecified: Secondary | ICD-10-CM | POA: Diagnosis not present

## 2020-08-14 DIAGNOSIS — I440 Atrioventricular block, first degree: Secondary | ICD-10-CM | POA: Diagnosis not present

## 2020-08-14 DIAGNOSIS — G4733 Obstructive sleep apnea (adult) (pediatric): Secondary | ICD-10-CM | POA: Diagnosis not present

## 2020-08-14 DIAGNOSIS — I10 Essential (primary) hypertension: Secondary | ICD-10-CM | POA: Insufficient documentation

## 2020-08-14 DIAGNOSIS — I4891 Unspecified atrial fibrillation: Secondary | ICD-10-CM

## 2020-08-14 DIAGNOSIS — Z7901 Long term (current) use of anticoagulants: Secondary | ICD-10-CM | POA: Insufficient documentation

## 2020-08-14 DIAGNOSIS — I2510 Atherosclerotic heart disease of native coronary artery without angina pectoris: Secondary | ICD-10-CM | POA: Insufficient documentation

## 2020-08-14 DIAGNOSIS — Z79899 Other long term (current) drug therapy: Secondary | ICD-10-CM | POA: Insufficient documentation

## 2020-08-14 DIAGNOSIS — I110 Hypertensive heart disease with heart failure: Secondary | ICD-10-CM | POA: Diagnosis not present

## 2020-08-14 DIAGNOSIS — I428 Other cardiomyopathies: Secondary | ICD-10-CM | POA: Diagnosis not present

## 2020-08-14 DIAGNOSIS — E1140 Type 2 diabetes mellitus with diabetic neuropathy, unspecified: Secondary | ICD-10-CM | POA: Diagnosis not present

## 2020-08-14 HISTORY — PX: CARDIOVERSION: SHX1299

## 2020-08-14 LAB — SARS CORONAVIRUS 2 (TAT 6-24 HRS): SARS Coronavirus 2: NEGATIVE

## 2020-08-14 LAB — GLUCOSE, CAPILLARY: Glucose-Capillary: 131 mg/dL — ABNORMAL HIGH (ref 70–99)

## 2020-08-14 SURGERY — CARDIOVERSION
Anesthesia: General

## 2020-08-14 MED ORDER — PROPOFOL 10 MG/ML IV BOLUS
INTRAVENOUS | Status: DC | PRN
  Administered 2020-08-14: 80 mg via INTRAVENOUS

## 2020-08-14 MED ORDER — PROPOFOL 10 MG/ML IV BOLUS: INTRAVENOUS | Status: DC | PRN

## 2020-08-14 MED ORDER — PROPOFOL 500 MG/50ML IV EMUL
INTRAVENOUS | Status: AC
  Filled 2020-08-14: qty 50

## 2020-08-14 MED ORDER — SODIUM CHLORIDE 0.9 % IV SOLN: INTRAVENOUS | Status: DC | PRN

## 2020-08-14 NOTE — Procedures (Addendum)
Cardioversion procedure note For atrial fibrillation.  Procedure Details:  Consent: Risks of procedure as well as the alternatives and risks of each were explained to the (patient/caregiver).  Consent for procedure obtained.  Time Out: Verified patient identification, verified procedure, site/side was marked, verified correct patient position, special equipment/implants available, medications/allergies/relevent history reviewed, required imaging and test results available.  Performed  Patient placed on cardiac monitor, pulse oximetry, supplemental oxygen as necessary.   Sedation given: propofol IV per anesthesia team  Pacer pads placed anterior and posterior chest.   Cardioverted 2 time(s) unsuccessfully   Cardioverted at  200J. Synchronized biphasic Patient remains in atrial fibrillation   Evaluation: Findings: Post procedure EKG shows: atrial fibrillation Complications: None Patient did tolerate procedure well. We will have patient follow-up with EP/A. fib clinic for additional recommendations/management due to persistent atrial fibrillation likely causing cardiomyopathy, EF 30 to 35%.  Discussed in detail with patient.  Time Spent Directly with the Patient:  35 minutes   Debbe Odea, M.D.

## 2020-08-14 NOTE — Transfer of Care (Signed)
Immediate Anesthesia Transfer of Care Note  Patient: Taylor [name]  Procedure(s) Performed: CARDIOVERSION (N/A )  Patient Location: Short Stay  Anesthesia Type:General  Level of Consciousness: drowsy  Airway & Oxygen Therapy: Patient connected to nasal cannula oxygen  Post-op Assessment: Post -op Vital signs reviewed and stable  Post vital signs: stable  Last Vitals:  Vitals Value Taken Time  BP 106/71 08/14/20 0818  Temp    Pulse 83 08/14/20 0820  Resp 20 08/14/20 0820  SpO2 96 % 08/14/20 0820    Last Pain:  Vitals:   08/14/20 0735  TempSrc: Oral  PainSc: 0-No pain         Complications: No complications documented.

## 2020-08-14 NOTE — Discharge Instructions (Signed)
Moderate Conscious Sedation, Adult, Care After This sheet gives you information about how to care for yourself after your procedure. Your health care provider may also give you more specific instructions. If you have problems or questions, contact your health care provider. What can I expect after the procedure? After the procedure, it is common to have:  Sleepiness for several hours.  Impaired judgment for several hours.  Difficulty with balance.  Vomiting if you eat too soon. Follow these instructions at home: For the time period you were told by your health care provider:  Rest.  Do not participate in activities where you could fall or become injured.  Do not drive or use machinery.  Do not drink alcohol.  Do not take sleeping pills or medicines that cause drowsiness.  Do not make important decisions or sign legal documents.  Do not take care of children on your own.      Eating and drinking  Follow the diet recommended by your health care provider.  Drink enough fluid to keep your urine pale yellow.  If you vomit: ? Drink water, juice, or soup when you can drink without vomiting. ? Make sure you have little or no nausea before eating solid foods.   General instructions  Take over-the-counter and prescription medicines only as told by your health care provider.  Have a responsible adult stay with you for the time you are told. It is important to have someone help care for you until you are awake and alert.  Do not smoke.  Keep all follow-up visits as told by your health care provider. This is important. Contact a health care provider if:  You are still sleepy or having trouble with balance after 24 hours.  You feel light-headed.  You keep feeling nauseous or you keep vomiting.  You develop a rash.  You have a fever.  You have redness or swelling around the IV site. Get help right away if:  You have trouble breathing.  You have new-onset confusion at  home. Summary  After the procedure, it is common to feel sleepy, have impaired judgment, or feel nauseous if you eat too soon.  Rest after you get home. Know the things you should not do after the procedure.  Follow the diet recommended by your health care provider and drink enough fluid to keep your urine pale yellow.  Get help right away if you have trouble breathing or new-onset confusion at home. This information is not intended to replace advice given to you by your health care provider. Make sure you discuss any questions you have with your health care provider. Document Revised: 09/22/2019 Document Reviewed: 04/20/2019 Elsevier Patient Education  2021 Elsevier Inc. Electrical Cardioversion Electrical cardioversion is the delivery of a jolt of electricity to restore a normal rhythm to the heart. A rhythm that is too fast or is not regular keeps the heart from pumping well. In this procedure, sticky patches or metal paddles are placed on the chest to deliver electricity to the heart from a device. This procedure may be done in an emergency if:  There is low or no blood pressure as a result of the heart rhythm.  Normal rhythm must be restored as fast as possible to protect the brain and heart from further damage.  It may save a life. This may also be a scheduled procedure for irregular or fast heart rhythms that are not immediately life-threatening. Tell a health care provider about:  Any allergies you have.    All medicines you are taking, including vitamins, herbs, eye drops, creams, and over-the-counter medicines.  Any problems you or family members have had with anesthetic medicines.  Any blood disorders you have.  Any surgeries you have had.  Any medical conditions you have.  Whether you are pregnant or may be pregnant. What are the risks? Generally, this is a safe procedure. However, problems may occur, including:  Allergic reactions to medicines.  A blood clot  that breaks free and travels to other parts of your body.  The possible return of an abnormal heart rhythm within hours or days after the procedure.  Your heart stopping (cardiac arrest). This is rare. What happens before the procedure? Medicines  Your health care provider may have you start taking: ? Blood-thinning medicines (anticoagulants) so your blood does not clot as easily. ? Medicines to help stabilize your heart rate and rhythm.  Ask your health care provider about: ? Changing or stopping your regular medicines. This is especially important if you are taking diabetes medicines or blood thinners. ? Taking medicines such as aspirin and ibuprofen. These medicines can thin your blood. Do not take these medicines unless your health care provider tells you to take them. ? Taking over-the-counter medicines, vitamins, herbs, and supplements. General instructions  Follow instructions from your health care provider about eating or drinking restrictions.  Plan to have someone take you home from the hospital or clinic.  If you will be going home right after the procedure, plan to have someone with you for 24 hours.  Ask your health care provider what steps will be taken to help prevent infection. These may include washing your skin with a germ-killing soap. What happens during the procedure?  An IV will be inserted into one of your veins.  Sticky patches (electrodes) or metal paddles may be placed on your chest.  You will be given a medicine to help you relax (sedative).  An electrical shock will be delivered. The procedure may vary among health care providers and hospitals.   What can I expect after the procedure?  Your blood pressure, heart rate, breathing rate, and blood oxygen level will be monitored until you leave the hospital or clinic.  Your heart rhythm will be watched to make sure it does not change.  You may have some redness on the skin where the shocks were  given. Follow these instructions at home:  Do not drive for 24 hours if you were given a sedative during your procedure.  Take over-the-counter and prescription medicines only as told by your health care provider.  Ask your health care provider how to check your pulse. Check it often.  Rest for 48 hours after the procedure or as told by your health care provider.  Avoid or limit your caffeine use as told by your health care provider.  Keep all follow-up visits as told by your health care provider. This is important. Contact a health care provider if:  You feel like your heart is beating too quickly or your pulse is not regular.  You have a serious muscle cramp that does not go away. Get help right away if:  You have discomfort in your chest.  You are dizzy or you feel faint.  You have trouble breathing or you are short of breath.  Your speech is slurred.  You have trouble moving an arm or leg on one side of your body.  Your fingers or toes turn cold or blue. Summary  Electrical cardioversion is   the delivery of a jolt of electricity to restore a normal rhythm to the heart.  This procedure may be done right away in an emergency or may be a scheduled procedure if the condition is not an emergency.  Generally, this is a safe procedure.  After the procedure, check your pulse often as told by your health care provider. This information is not intended to replace advice given to you by your health care provider. Make sure you discuss any questions you have with your health care provider. Document Revised: 12/26/2018 Document Reviewed: 12/26/2018 Elsevier Patient Education  2021 Elsevier Inc.  

## 2020-08-14 NOTE — Telephone Encounter (Signed)
Taylor Odea, MD  Gibson Ramp, RN Normal Covid test, no need to call patient regarding Covid test.    Please schedule patient to see electrophysiology/Dr. Lalla Brothers for persistent atrial fibrillation. DC cardioversion was unsuccessful today. Thank you

## 2020-08-14 NOTE — Anesthesia Preprocedure Evaluation (Addendum)
Anesthesia Evaluation  Patient identified by MRN, date of birth, ID band Patient awake    Reviewed: Allergy & Precautions, H&P , NPO status , Patient's Chart, lab work & pertinent test results, reviewed documented beta blocker date and time   History of Anesthesia Complications Negative for: history of anesthetic complications  Airway Mallampati: III  TM Distance: >3 FB Neck ROM: full    Dental  (+) Dental Advidsory Given, Edentulous Upper, Edentulous Lower   Pulmonary neg shortness of breath, sleep apnea , neg COPD, neg recent URI,    Pulmonary exam normal breath sounds clear to auscultation       Cardiovascular Exercise Tolerance: Good hypertension, (-) angina+ CAD and +CHF  (-) Past MI and (-) Cardiac Stents + dysrhythmias Atrial Fibrillation (-) Valvular Problems/Murmurs Rhythm:regular Rate:Normal     Neuro/Psych PSYCHIATRIC DISORDERS Depression negative neurological ROS     GI/Hepatic negative GI ROS, Neg liver ROS,   Endo/Other  diabetesMorbid obesity  Renal/GU negative Renal ROS  negative genitourinary   Musculoskeletal   Abdominal   Peds  Hematology negative hematology ROS (+)   Anesthesia Other Findings Past Medical History: No date: Arthritis 02/07/2019: Broken leg No date: Depression No date: Diabetes mellitus without complication (H[facility]) No date: Hypertension Echo shows EF of 30-35%  Reproductive/Obstetrics negative OB ROS                            Anesthesia Physical Anesthesia Plan  ASA: III  Anesthesia Plan: General   Post-op Pain Management:    Induction: Intravenous  PONV Risk Score and Plan: 2 and TIVA and Propofol infusion  Airway Management Planned: Natural Airway and Nasal Cannula  Additional Equipment:   Intra-op Plan:   Post-operative Plan:   Informed Consent: I have reviewed the patients History and Physical, chart, labs and discussed the  procedure including the risks, benefits and alternatives for the proposed anesthesia with the patient or authorized representative who has indicated his/her understanding and acceptance.     Dental Advisory Given  Plan Discussed with: Anesthesiologist, CRNA and Surgeon  Anesthesia Plan Comments:         Anesthesia Quick Evaluation

## 2020-08-14 NOTE — Interval H&P Note (Signed)
History and Physical Interval Note:  08/14/2020 8:22 AM  Taylor [name]  has presented today for surgery, with the diagnosis of  AFib.  The various methods of treatment have been discussed with the patient and family. After consideration of risks, benefits and other options for treatment, the patient has consented to  Procedure(s): CARDIOVERSION (N/A) as a surgical intervention.  The patient's history has been reviewed, patient examined, no change in status, stable for surgery.  I have reviewed the patient's chart and labs.  Questions were answered to the patient's satisfaction.     Arlys John Agbor-Etang

## 2020-08-14 NOTE — Anesthesia Postprocedure Evaluation (Signed)
Anesthesia Post Note  Patient: Taylor [name]  Procedure(s) Performed: CARDIOVERSION (N/A )  Patient location during evaluation: Specials Recovery Anesthesia Type: General Level of consciousness: awake and alert Pain management: pain level controlled Vital Signs Assessment: post-procedure vital signs reviewed and stable Respiratory status: spontaneous breathing, nonlabored ventilation, respiratory function stable and patient connected to nasal cannula oxygen Cardiovascular status: blood pressure returned to baseline and stable Postop Assessment: no apparent nausea or vomiting Anesthetic complications: no   No complications documented.   Last Vitals:  Vitals:   08/14/20 0820 08/14/20 0825  BP:  119/69  Pulse: 83 79  Resp: 20 12  Temp:    SpO2: 96% 97%    Last Pain:  Vitals:   08/14/20 0735  TempSrc: Oral  PainSc: 0-No pain                 Lenard Simmer

## 2020-08-14 NOTE — Telephone Encounter (Signed)
Reviewing Taylor [name]'s results today. Dr. Azucena Cecil had fowarded the message below to her.  Referral placed for Dr. Lalla Brothers.  Please call to schedule a next available appointment for his atrial fibrillation (s/p unsuccessful DCCV today (3/9)).

## 2020-08-15 ENCOUNTER — Encounter: Payer: Self-pay | Admitting: Cardiology

## 2020-08-15 NOTE — Telephone Encounter (Signed)
Appointment scheduled   Closing encounter

## 2020-08-26 ENCOUNTER — Ambulatory Visit: Payer: Medicare Other | Admitting: Cardiology

## 2020-08-27 NOTE — Telephone Encounter (Signed)
Synetta Fail, can this encounter be closed? Thanks

## 2020-08-28 DIAGNOSIS — G4733 Obstructive sleep apnea (adult) (pediatric): Secondary | ICD-10-CM | POA: Diagnosis not present

## 2020-09-04 ENCOUNTER — Ambulatory Visit: Payer: Medicare Other | Admitting: Cardiology

## 2020-09-04 ENCOUNTER — Encounter: Payer: Self-pay | Admitting: Cardiology

## 2020-09-04 ENCOUNTER — Other Ambulatory Visit: Payer: Self-pay

## 2020-09-04 VITALS — BP 110/82 | HR 116 | Ht 72.0 in | Wt 359.5 lb

## 2020-09-04 DIAGNOSIS — Z6841 Body Mass Index (BMI) 40.0 and over, adult: Secondary | ICD-10-CM

## 2020-09-04 DIAGNOSIS — I4819 Other persistent atrial fibrillation: Secondary | ICD-10-CM

## 2020-09-04 DIAGNOSIS — I5020 Unspecified systolic (congestive) heart failure: Secondary | ICD-10-CM | POA: Diagnosis not present

## 2020-09-04 NOTE — Progress Notes (Signed)
Electrophysiology Office Note:    Date:  09/04/2020   ID:  Taylor [name], DOB [birth year], MRN [id number]  PCP:  Taylor Limes, MD  [facility] Cardiologist:  Taylor Odea, MD  [facility] Electrophysiologist:  Taylor Prude, MD   Referring MD: Taylor Odea, MD   Chief Complaint: Atrial fibrillation  History of Present Illness:    Taylor [name] is a 65 y.o. male who presents for an evaluation of atrial fibrillation at the request of Taylor [name]. Their medical history includes coronary artery disease, diabetes, cardiomyopathy at a proportion to coronary artery disease, sleep apnea, hypertension.  He was last seen by Taylor [name] August 08, 2020.  At that outpatient appointment he was set up for cardioversion which was performed on August 14, 2020.  Unfortunately, the patient could not be successfully cardioverted.   Patient is unable to tell when he is in or out of atrial fibrillation.  He has noticed a general decline in his exercise capacity over the preceding months and even years.  Past Medical History:  Diagnosis Date  . Arthritis   . Broken leg 02/07/2019  . Depression   . Diabetes mellitus without complication (HCC)   . Hypertension     Past Surgical History:  Procedure Laterality Date  . BACK SURGERY    . CARDIOVERSION N/A 08/14/2020   Procedure: CARDIOVERSION;  Surgeon: Taylor Odea, MD;  Location: [facility] ORS;  Service: Cardiovascular;  Laterality: N/A;  . LEFT HEART CATH AND CORONARY ANGIOGRAPHY Left 04/05/2020   Procedure: LEFT HEART CATH AND CORONARY ANGIOGRAPHY;  Surgeon: Taylor Ouch, MD;  Location: [facility] INVASIVE CV LAB;  Service: Cardiovascular;  Laterality: Left;    Current Medications: No outpatient medications have been marked as taking for the 09/04/20 encounter (Appointment) with Taylor Prude, MD.     Allergies:   Other   Social History   Socioeconomic History  . Marital status: Single    Spouse name: Not  on file  . Number of children: 1  . Years of education: Not on file  . Highest education level: Some college, no degree  Occupational History  . Occupation: disability/retired  Tobacco Use  . Smoking status: Never Smoker  . Smokeless tobacco: Never Used  Vaping Use  . Vaping Use: Never used  Substance and Sexual Activity  . Alcohol use: No    Alcohol/week: 0.0 standard drinks  . Drug use: No  . Sexual activity: Not on file  Other Topics Concern  . Not on file  Social History Narrative   Lives with Mother Taylor [name].    Social Determinants of Health   Financial Resource Strain: Not on file  Food Insecurity: Not on file  Transportation Needs: Not on file  Physical Activity: Not on file  Stress: Not on file  Social Connections: Not on file     Family History: The patient's family history includes Diabetes in his mother and sister.  ROS:   Please see the history of present illness.    All other systems reviewed and are negative.  EKGs/Labs/Other Studies Reviewed:    The following studies were reviewed today:  Hospital records  04/05/2020 LHC  Mid LAD lesion is 60% stenosed.  2nd Diag lesion is 30% stenosed.  1.  Borderline 60% stenosis in the mid left anterior descending artery.  Mild diffuse disease affecting the left circumflex and right coronary artery. 2.  Left ventricular angiography was not performed.  EF was moderately reduced by echo. 3.  Moderately  elevated left ventricular end-diastolic pressure at 22 mmHg. Recommendations: Recommend aggressive medical therapy. Resume Eliquis tomorrow. Consider cardioversion in 3 to 4 weeks given persistent tachycardia. If the patient has persistent angina symptoms after that in spite of medical therapy, FFR guided revascularization of the LAD can be considered.   December 12, 2019 echo personally reviewed Left ventricular function moderate to severely decreased, 30% Right ventricular function mildly reduced Mildly dilated  left atrium Trivial MR     EKG:  The ekg ordered today demonstrates atrial fibrillation with a ventricular rate of 116 bpm  Recent Labs: 12/07/2019: ALT 9 08/08/2020: BUN 18; Creatinine, Ser 1.18; Hemoglobin 12.9; Platelets 208; Potassium 4.2; Sodium 137  Recent Lipid Panel    Component Value Date/Time   CHOL 140 09/25/2019 1509   TRIG 106 09/25/2019 1509   HDL 46 09/25/2019 1509   CHOLHDL 3.0 09/25/2019 1509   LDLCALC 75 09/25/2019 1509    Physical Exam:    VS:  There were no vitals taken for this visit.    Wt Readings from Last 3 Encounters:  08/14/20 (!) 385 lb (174.6 kg)  08/08/20 (!) 358 lb (162.4 kg)  07/01/20 (!) 359 lb 12.8 oz (163.2 kg)     GEN:  Well nourished, well developed in no acute distress.  Morbidly obese HEENT: Normal NECK: No JVD; No carotid bruits LYMPHATICS: No lymphadenopathy CARDIAC: Irregularly irregular, no murmurs, rubs, gallops RESPIRATORY:  Clear to auscultation without rales, wheezing or rhonchi  ABDOMEN: Soft, non-tender, non-distended MUSCULOSKELETAL:  No edema; No deformity  SKIN: Warm and dry NEUROLOGIC:  Alert and oriented x 3 PSYCHIATRIC:  Normal affect   ASSESSMENT:    1. Persistent atrial fibrillation (HCC)   2. Morbid obesity with BMI of 40.0-44.9, adult (HCC)   3. HFrEF (heart failure with reduced ejection fraction) (HCC)    PLAN:    In order of problems listed above:  1. Persistent atrial fibrillation Symptomatic.  Also contributing to his cardiomyopathy.  Has failed cardioversion in the recent past. On Eliquis for stroke prophylaxis. I think a rhythm control strategy is important for Taylor [name] given his left ventricular dysfunction.  We will need the assistance of an antiarrhythmic medication to achieve normal rhythm. Given the patient's morbid obesity, he is not a candidate for invasive management of his atrial fibrillation.  I discussed antiarrhythmic options with the patient including amiodarone and dofetilide.  He  has an acceptable QTC and his creatinine is normal.  I think the best course of action would be to load him with dofetilide.  I discussed medications with the patient in great detail during today's appointment but he is very hesitant to proceed with starting either option.  He would like to work on exercising and wearing his CPAP.  We will plan to see each other back in clinic in 3 months with an echo at that time.  If his ejection fraction is persistently decreased at that appointment, the patient tells me he would like to proceed with drug loading.  2.  Morbid obesity I did discuss the link between morbid obesity and atrial arrhythmias during today's appointment.  He tells me he will plan to work on exercise.  3.  Chronic combined systolic and diastolic heart failure NYHA class II-III.  Warm and relatively euvolemic on today's exam.  I suspect there is an element of a tachycardia induced cardiomyopathy given his persistently elevated ventricular rates.  Would like to restore normal rhythm.  We will plan to discuss this again at  our 9-month follow-up appointment.   Total time spent with patient today 65 minutes. This includes reviewing records, evaluating the patient and coordinating care.  Medication Adjustments/Labs and Tests Ordered: Current medicines are reviewed at length with the patient today.  Concerns regarding medicines are outlined above.  No orders of the defined types were placed in this encounter.  No orders of the defined types were placed in this encounter.    Signed, Steffanie Dunn, MD, [facility]  09/04/2020 1:31 PM    Electrophysiology [facility]

## 2020-09-04 NOTE — Patient Instructions (Addendum)
Medication Instructions:  Your physician recommends that you continue on your current medications as directed. Please refer to the Current Medication list given to you today. *If you need a refill on your cardiac medications before your next appointment, please call your pharmacy*  Lab Work: None ordered. If you have labs (blood work) drawn today and your tests are completely normal, you will receive your results only by: Marland Kitchen MyChart Message (if you have MyChart) OR . A paper copy in the mail If you have any lab test that is abnormal or we need to change your treatment, we will call you to review the results.  Testing/Procedures: Your physician has requested that you have an echocardiogram. Echocardiography is a painless test that uses sound waves to create images of your heart. It provides your doctor with information about the size and shape of your heart and how well your heart's chambers and valves are working. This procedure takes approximately one hour. There are no restrictions for this procedure.  ECHO in 3 months  Follow-Up: At [facility], you and your health needs are our priority.  As part of our continuing mission to provide you with exceptional heart care, we have created designated Provider Care Teams.  These Care Teams include your primary Cardiologist (physician) and Advanced Practice Providers (APPs -  Physician Assistants and Nurse Practitioners) who all work together to provide you with the care you need, when you need it.  Your next appointment:   Your physician wants you to follow-up in: 3 months with Dr. Lalla Brothers after echo  Dofetilide capsules What is this medicine? DOFETILIDE (doe FET il ide) is an antiarrhythmic drug. It helps make your heart beat regularly. This medicine also helps to slow rapid heartbeats. This medicine may be used for other purposes; ask your health care provider or pharmacist if you have questions. COMMON BRAND NAME(S): Tikosyn What should I  tell my health care provider before I take this medicine? They need to know if you have any of these conditions:  heart disease  history of irregular heartbeat  history of low levels of potassium or magnesium in the blood  kidney disease  liver disease  an unusual or allergic reaction to dofetilide, other medicines, foods, dyes, or preservatives  pregnant or trying to get pregnant  breast-feeding How should I use this medicine? Take this medicine by mouth with a glass of water. Follow the directions on the prescription label. Do not take with grapefruit juice. You can take it with or without food. If it upsets your stomach, take it with food. Take your medicine at regular intervals. Do not take it more often than directed. Do not stop taking except on your doctor's advice. A special MedGuide will be given to you by the pharmacist with each prescription and refill. Be sure to read this information carefully each time. Talk to your pediatrician regarding the use of this medicine in children. Special care may be needed. Overdosage: If you think you have taken too much of this medicine contact a poison control center or emergency room at once. NOTE: This medicine is only for you. Do not share this medicine with others. What if I miss a dose? If you miss a dose, skip it. Take your next dose at the normal time. Do not take extra or 2 doses at the same time to make up for the missed dose. What may interact with this medicine? Do not take this medicine with any of the following medications:  cimetidine  cisapride  dolutegravir  dronedarone  erdafitinib  hydrochlorothiazide  ketoconazole  megestrol  pimozide  prochlorperazine  thioridazine  trimethoprim  verapamil This medicine may also interact with the following medications:  amiloride  cannabinoids  certain antibiotics like erythromycin or clarithromycin  certain antiviral medicines for HIV or  hepatitis  certain medicines for depression, anxiety, or psychotic disorders  digoxin  diltiazem  grapefruit juice  metformin  nefazodone  other medicines that prolong the QT interval (an abnormal heart rhythm)  quinine  triamterene  zafirlukast  ziprasidone This list may not describe all possible interactions. Give your health care provider a list of all the medicines, herbs, non-prescription drugs, or dietary supplements you use. Also tell them if you smoke, drink alcohol, or use illegal drugs. Some items may interact with your medicine. What should I watch for while using this medicine? Your condition will be monitored carefully while you are receiving this medicine. What side effects may I notice from receiving this medicine? Side effects that you should report to your doctor or health care professional as soon as possible:  allergic reactions like skin rash, itching or hives, swelling of the face, lips, or tongue  breathing problems  chest pain or chest tightness  dizziness  signs and symptoms of a dangerous change in heartbeat or heart rhythm like chest pain; dizziness; fast or irregular heartbeat; palpitations; feeling faint or lightheaded, falls; breathing problems  signs and symptoms of electrolyte imbalance like severe diarrhea, unusual sweating, vomiting, loss of appetite, increased thirst  swelling of the ankles, legs, or feet  tingling, numbness in the hands or feet Side effects that usually do not require medical attention (report to your doctor or health care professional if they continue or are bothersome):  diarrhea  general ill feeling or flu-like symptoms  headache  nausea  trouble sleeping  stomach pain This list may not describe all possible side effects. Call your doctor for medical advice about side effects. You may report side effects to FDA at 1-800-FDA-1088. Where should I keep my medicine? Keep out of the reach of children. Store  at room temperature between 15 and 30 degrees C (59 and 86 degrees F). Throw away any unused medicine after the expiration date. NOTE: This sheet is a summary. It may not cover all possible information. If you have questions about this medicine, talk to your doctor, pharmacist, or health care provider.  2021 Elsevier/Gold Standard (2019-04-25 12:14:05)  Amiodarone tablets What is this medicine? AMIODARONE (a MEE oh da rone) is an antiarrhythmic drug. It helps make your heart beat regularly. Because of the side effects caused by this medicine, it is only used when other medicines have not worked. It is usually used for heartbeat problems that may be life threatening. This medicine may be used for other purposes; ask your health care provider or pharmacist if you have questions. COMMON BRAND NAME(S): Cordarone, Pacerone What should I tell my health care provider before I take this medicine? They need to know if you have any of these conditions:  liver disease  lung disease  other heart problems  thyroid disease  an unusual or allergic reaction to amiodarone, iodine, other medicines, foods, dyes, or preservatives  pregnant or trying to get pregnant  breast-feeding How should I use this medicine? Take this medicine by mouth with a glass of water. Follow the directions on the prescription label. You can take this medicine with or without food. However, you should always take it the same way each  time. Take your doses at regular intervals. Do not take your medicine more often than directed. Do not stop taking except on the advice of your doctor or health care professional. A special MedGuide will be given to you by the pharmacist with each prescription and refill. Be sure to read this information carefully each time. Talk to your pediatrician regarding the use of this medicine in children. Special care may be needed. Overdosage: If you think you have taken too much of this medicine contact a  poison control center or emergency room at once. NOTE: This medicine is only for you. Do not share this medicine with others. What if I miss a dose? If you miss a dose, take it as soon as you can. If it is almost time for your next dose, take only that dose. Do not take double or extra doses. What may interact with this medicine? Do not take this medicine with any of the following medications:  abarelix  apomorphine  arsenic trioxide  certain antibiotics like erythromycin, gemifloxacin, levofloxacin, pentamidine  certain medicines for depression like amoxapine, tricyclic antidepressants  certain medicines for fungal infections like fluconazole, itraconazole, ketoconazole, posaconazole, voriconazole  certain medicines for irregular heart beat like disopyramide, dronedarone, ibutilide, propafenone, sotalol  certain medicines for malaria like chloroquine, halofantrine  cisapride  droperidol  haloperidol  hawthorn  maprotiline  methadone  phenothiazines like chlorpromazine, mesoridazine, thioridazine  pimozide  ranolazine  red yeast rice  vardenafil This medicine may also interact with the following medications:  antiviral medicines for HIV or AIDS  certain medicines for blood pressure, heart disease, irregular heart beat  certain medicines for cholesterol like atorvastatin, cerivastatin, lovastatin, simvastatin  certain medicines for hepatitis C like sofosbuvir and ledipasvir; sofosbuvir  certain medicines for seizures like phenytoin  certain medicines for thyroid problems  certain medicines that treat or prevent blood clots like warfarin  cholestyramine  cimetidine  clopidogrel  cyclosporine  dextromethorphan  diuretics  dofetilide  fentanyl  general anesthetics  grapefruit juice  lidocaine  loratadine  methotrexate  other medicines that prolong the QT interval (cause an abnormal heart  rhythm)  procainamide  quinidine  rifabutin, rifampin, or rifapentine  St. John's Wort  trazodone  ziprasidone This list may not describe all possible interactions. Give your health care provider a list of all the medicines, herbs, non-prescription drugs, or dietary supplements you use. Also tell them if you smoke, drink alcohol, or use illegal drugs. Some items may interact with your medicine. What should I watch for while using this medicine? Your condition will be monitored closely when you first begin therapy. Often, this drug is first started in a hospital or other monitored health care setting. Once you are on maintenance therapy, visit your doctor or health care professional for regular checks on your progress. Because your condition and use of this medicine carry some risk, it is a good idea to carry an identification card, necklace or bracelet with details of your condition, medications, and doctor or health care professional. Bonita Quin may get drowsy or dizzy. Do not drive, use machinery, or do anything that needs mental alertness until you know how this medicine affects you. Do not stand or sit up quickly, especially if you are an older patient. This reduces the risk of dizzy or fainting spells. This medicine can make you more sensitive to the sun. Keep out of the sun. If you cannot avoid being in the sun, wear protective clothing and use sunscreen. Do not use sun lamps  or tanning beds/booths. You should have regular eye exams before and during treatment. Call your doctor if you have blurred vision, see halos, or your eyes become sensitive to light. Your eyes may get dry. It may be helpful to use a lubricating eye solution or artificial tears solution. If you are going to have surgery or a procedure that requires contrast dyes, tell your doctor or health care professional that you are taking this medicine. What side effects may I notice from receiving this medicine? Side effects that you  should report to your doctor or health care professional as soon as possible:  allergic reactions like skin rash, itching or hives, swelling of the face, lips, or tongue  blue-gray coloring of the skin  blurred vision, seeing blue green halos, increased sensitivity of the eyes to light  breathing problems  chest pain  dark urine  fast, irregular heartbeat  feeling faint or light-headed  intolerance to heat or cold  nausea or vomiting  pain and swelling of the scrotum  pain, tingling, numbness in feet, hands  redness, blistering, peeling or loosening of the skin, including inside the mouth  spitting up blood  stomach pain  sweating  unusual or uncontrolled movements of body  unusually weak or tired  weight gain or loss  yellowing of the eyes or skin Side effects that usually do not require medical attention (report to your doctor or health care professional if they continue or are bothersome):  change in sex drive or performance  constipation  dizziness  headache  loss of appetite  trouble sleeping This list may not describe all possible side effects. Call your doctor for medical advice about side effects. You may report side effects to FDA at 1-800-FDA-1088. Where should I keep my medicine? Keep out of the reach of children. Store at room temperature between 20 and 25 degrees C (68 and 77 degrees F). Protect from light. Keep container tightly closed. Throw away any unused medicine after the expiration date. NOTE: This sheet is a summary. It may not cover all possible information. If you have questions about this medicine, talk to your doctor, pharmacist, or health care provider.  2021 Elsevier/Gold Standard (2018-04-27 13:44:04)

## 2020-09-04 NOTE — Telephone Encounter (Signed)
Called pt, no answer- LMTCB- want to check with him and be sure he got what he needed.

## 2020-09-06 NOTE — Telephone Encounter (Signed)
Left message for patient to call back  

## 2020-09-09 ENCOUNTER — Other Ambulatory Visit: Payer: Self-pay | Admitting: *Deleted

## 2020-09-09 MED ORDER — APIXABAN 5 MG PO TABS: 5.0000 mg | ORAL_TABLET | Freq: Two times a day (BID) | ORAL | 6 refills | Status: DC

## 2020-09-09 NOTE — Telephone Encounter (Signed)
Pt's age 65, wt 163 kg, SCr 1.18, CrCl 147.73, last ov w/ CL 09/04/20.

## 2020-09-12 ENCOUNTER — Ambulatory Visit: Payer: Medicare Other | Admitting: Cardiology

## 2020-09-12 ENCOUNTER — Other Ambulatory Visit: Payer: Self-pay

## 2020-09-12 ENCOUNTER — Encounter: Payer: Self-pay | Admitting: Cardiology

## 2020-09-12 VITALS — BP 120/70 | HR 102 | Ht 72.0 in | Wt 360.0 lb

## 2020-09-12 DIAGNOSIS — I4819 Other persistent atrial fibrillation: Secondary | ICD-10-CM

## 2020-09-12 DIAGNOSIS — I2510 Atherosclerotic heart disease of native coronary artery without angina pectoris: Secondary | ICD-10-CM | POA: Diagnosis not present

## 2020-09-12 DIAGNOSIS — I428 Other cardiomyopathies: Secondary | ICD-10-CM

## 2020-09-12 DIAGNOSIS — I10 Essential (primary) hypertension: Secondary | ICD-10-CM

## 2020-09-12 NOTE — Patient Instructions (Signed)

## 2020-09-12 NOTE — Telephone Encounter (Signed)
Left detailed message for pt. Due to several unsuccessful attempts to reach pt, this encounter will be closed.

## 2020-09-12 NOTE — Progress Notes (Signed)
Cardiology Office Note:    Date:  09/12/2020   ID:  Taylor [name], DOB 04-04-1956, MRN [id number]  PCP:  Birdie Sons, MD  Cardiologist:  Kate Sable, MD  Electrophysiologist:  Vickie Epley, MD   Referring MD: Birdie Sons, MD   Chief Complaint  Patient presents with  . Other    Follow up post Cardioversion - Meds reviewed verbally with patient.     History of Present Illness:    Taylor [name] is a 65 y.o. male with a hx of hypertension, CAD (60% mLAD, 30% D2), diabetes, atrial fibrillation, NICM, EF 30 to 35%, OSA who presents for follow-up.    He is being seen for atrial fibrillation and nonischemic cardiomyopathy.  Patient seen in the hospital 08/14/2020 for DC cardioversion.  This was unsuccessful.  He was referred to see electrophysiologist, at which visit antiarrhythmic management was recommended.  Patient wanted to lose some weight and work on exercising prior to starting antiarrhythmics.  Recently received his CPAP mask, initially was difficulties, starting to tolerate it more.  Still complains of some exertional shortness of breath.   Prior notes Echocardiogram 12/2019 showed moderate to severely reduced EF, EF 30 to 35%. Left heart cath 03/2020 mid LAD 60% stenosis, second diagonal 30% stenosis. Failed DC cardioversion (for afib) attempt on 08/06/2020   Past Medical History:  Diagnosis Date  . Arthritis   . Broken leg 02/07/2019  . Depression   . Diabetes mellitus without complication ([place])   . Hypertension     Past Surgical History:  Procedure Laterality Date  . BACK SURGERY    . CARDIOVERSION N/A 08/14/2020   Procedure: CARDIOVERSION;  Surgeon: Kate Sable, MD;  Location: [facility]S;  Service: Cardiovascular;  Laterality: N/A;  . LEFT HEART CATH AND CORONARY ANGIOGRAPHY Left 04/05/2020   Procedure: LEFT HEART CATH AND CORONARY ANGIOGRAPHY;  Surgeon: Wellington Hampshire, MD;  Location: [facility];  Service: Cardiovascular;   Laterality: Left;    Current Medications: Current Meds  Medication Sig  . apixaban (ELIQUIS) 5 MG TABS tablet Take 1 tablet (5 mg total) by mouth 2 (two) times daily.  . dapagliflozin propanediol (FARXIGA) 10 MG TABS tablet Take 1 tablet (10 mg total) by mouth daily before breakfast.  . glipiZIDE (GLUCOTROL XL) 10 MG 24 hr tablet TAKE 1 TABLET BY MOUTH ONCE DAILY.  Marland Kitchen glucose blood (ONETOUCH ULTRA) test strip Use to check sugar daily for type 2 diabetes E11.9  . HYDROmorphone (DILAUDID) 4 MG tablet Take 4 mg by mouth in the morning, at noon, in the evening, and at bedtime. Must be name brand  . lisinopril (ZESTRIL) 10 MG tablet TAKE 1 TABLET BY MOUTH ONCE DAILY.  . metFORMIN (GLUCOPHAGE) 1000 MG tablet Take 1 tablet (1,000 mg total) by mouth 2 (two) times daily with a meal.  . metoprolol succinate (TOPROL-XL) 100 MG 24 hr tablet Take 1 tablet (100 mg total) by mouth daily. Take with or immediately following a meal.  . NARCAN 4 MG/0.1ML LIQD nasal spray kit Place 0.4 mg into the nose as needed (accidental overdose).  . OXYCONTIN 20 MG 12 hr tablet Take 20 mg by mouth in the morning and at bedtime. Take 20 mg by mouth 12 noon and 2400  . OXYCONTIN 30 MG 12 hr tablet Take 30 mg by mouth in the morning and at bedtime. Take 30 mg by mouth at 0500 and 1730  . pioglitazone (ACTOS) 45 MG tablet TAKE 1 TABLET BY  MOUTH ONCE DAILY.  Marland Kitchen potassium chloride SA (KLOR-CON) 20 MEQ tablet Take 1 tablet (20 mEq total) by mouth daily.  . pregabalin (LYRICA) 100 MG capsule Take 100 mg by mouth 2 (two) times daily.  . simvastatin (ZOCOR) 40 MG tablet TAKE 1 TABLET BY MOUTH ONCE DAILY.  Marland Kitchen torsemide (DEMADEX) 20 MG tablet Take 20 mg by mouth 2 (two) times daily.  . vitamin B-12 (CYANOCOBALAMIN) 1000 MCG tablet Take 1,000 mcg by mouth daily.  Marland Kitchen zolpidem (AMBIEN CR) 12.5 MG CR tablet Take 12.5 mg by mouth at bedtime.      Allergies:   Other   Social History   Socioeconomic History  . Marital status: Single     Spouse name: Not on file  . Number of children: 1  . Years of education: Not on file  . Highest education level: Some college, no degree  Occupational History  . Occupation: disability/retired  Tobacco Use  . Smoking status: Never Smoker  . Smokeless tobacco: Never Used  Vaping Use  . Vaping Use: Never used  Substance and Sexual Activity  . Alcohol use: No    Alcohol/week: 0.0 standard drinks  . Drug use: No  . Sexual activity: Not on file  Other Topics Concern  . Not on file  Social History Narrative   Lives with Mother [name].    Social Determinants of Health   Financial Resource Strain: Not on file  Food Insecurity: Not on file  Transportation Needs: Not on file  Physical Activity: Not on file  Stress: Not on file  Social Connections: Not on file     Family History: The patient's family history includes Diabetes in his mother and sister.  ROS:   Please see the history of present illness.     All other systems reviewed and are negative.  EKGs/Labs/Other Studies Reviewed:    The following studies were reviewed today:   EKG:  EKG is ordered today.  EKG shows atrial fibrillation, heart rate 102  Recent Labs: 12/07/2019: ALT 9 08/08/2020: BUN 18; Creatinine, Ser 1.18; Hemoglobin 12.9; Platelets 208; Potassium 4.2; Sodium 137  Recent Lipid Panel    Component Value Date/Time   CHOL 140 09/25/2019 1509   TRIG 106 09/25/2019 1509   HDL 46 09/25/2019 1509   CHOLHDL 3.0 09/25/2019 1509   LDLCALC 75 09/25/2019 1509    Physical Exam:    VS:  BP 120/70 (BP Location: Left Arm, Patient Position: Sitting, Cuff Size: Large)   Pulse (!) 102   Ht 6' (1.829 m)   Wt (!) 360 lb (163.3 kg)   SpO2 94%   BMI 48.82 kg/m     Wt Readings from Last 3 Encounters:  09/12/20 (!) 360 lb (163.3 kg)  09/04/20 (!) 359 lb 8 oz (163.1 kg)  08/14/20 (!) 385 lb (174.6 kg)     GEN:  Well nourished, well developed in no acute distress HEENT: Normal NECK: No JVD; No carotid  bruits LYMPHATICS: No lymphadenopathy CARDIAC: Irregular irregular, non tachycardic, no murmurs, rubs, gallops RESPIRATORY:  Clear to auscultation without rales, wheezing or rhonchi  ABDOMEN: Soft, non-tender, non-distended MUSCULOSKELETAL:  trace edema; No deformity  SKIN: Warm and dry NEUROLOGIC:  Alert and oriented x 3 PSYCHIATRIC:  Normal affect   ASSESSMENT:    1. Persistent atrial fibrillation ([place])   2. NICM (nonischemic cardiomyopathy) ([place])   3. Coronary artery disease involving native coronary artery of native heart without angina pectoris   4. Primary hypertension    PLAN:  In order of problems listed above:  1. persistent atrial fibrillation, heart rate reasonable at 102.  CHA2DS2-VASc score at 3 (chf,htn. DM).  Continue toprol xl 143m qd, Eliquis 550mtwice daily.  Appreciate input from electrophysiology.  Antiarrhythmics being considered.  Repeat echocardiogram scheduled in 2 months.  Encouraged to keep follow-up appointment with EP, and follow EP recommendations. 2. NICM, EF 30-35%.  NYHA class II-III symptoms. Cont Toprol-XL 10027mcontinue lisinopril 10 mg daily, torsemide 40 mg daily, Farxiga 10 mg daily.  Arrhythmia/A. fib likely contributing.  Plan for repeat echo in 2 months. 3. Nonobstructive CAD, 60% mid LAD, 30% diagonal.  Continue Eliquis, statin. 4. Patient with history of hypertension.  BP controlled.  Follow-up in 6 months  Total encounter time 40  minutes  Greater than 50% was spent in counseling and coordination of care with the patient   This note was generated in part or whole with voice recognition software. Voice recognition is usually quite accurate but there are transcription errors that can and very often do occur. I apologize for any typographical errors that were not detected and corrected.  Medication Adjustments/Labs and Tests Ordered: Current medicines are reviewed at length with the patient today.  Concerns regarding medicines are  outlined above.  Orders Placed This Encounter  Procedures  . EKG 12-Lead   No orders of the defined types were placed in this encounter.   Patient Instructions  Medication Instructions:  Your physician recommends that you continue on your current medications as directed. Please refer to the Current Medication list given to you today. *If you need a refill on your cardiac medications before your next appointment, please call your pharmacy*   Lab Work: None ordered If you have labs (blood work) drawn today and your tests are completely normal, you will receive your results only by: . MMarland KitchenChart Message (if you have MyChart) OR . A paper copy in the mail If you have any lab test that is abnormal or we need to change your treatment, we will call you to review the results.   Testing/Procedures: None ordered   Follow-Up: [facility] and your health needs are our priority.  As part of our continuing mission to provide you with exceptional heart care, we have created designated Provider Care Teams.  These Care Teams include your primary Cardiologist (physician) and Advanced Practice Providers (APPs -  Physician Assistants and Nurse Practitioners) who all work together to provide you with the care you need, when you need it.  We recommend signing up for the patient portal called "MyChart".  Sign up information is provided on this After Visit Summary.  MyChart is used to connect with patients for Virtual Visits (Telemedicine).  Patients are able to view lab/test results, encounter notes, upcoming appointments, etc.  Non-urgent messages can be sent to your provider as well.   To learn more about what you can do with MyChart, go to [email or web address]  Your next appointment:   6 month(s)  The format for your next appointment:   In Person  Provider:   BriKate SableD   Other Instructions      Signed, BriKate SableD  09/12/2020 12:30 PM    [place]

## 2020-09-17 ENCOUNTER — Other Ambulatory Visit: Payer: Self-pay | Admitting: Family Medicine

## 2020-09-17 DIAGNOSIS — E1165 Type 2 diabetes mellitus with hyperglycemia: Secondary | ICD-10-CM

## 2020-09-26 ENCOUNTER — Other Ambulatory Visit: Payer: Self-pay | Admitting: Family Medicine

## 2020-09-26 DIAGNOSIS — I10 Essential (primary) hypertension: Secondary | ICD-10-CM

## 2020-09-27 ENCOUNTER — Other Ambulatory Visit: Payer: Self-pay

## 2020-09-27 MED ORDER — METOPROLOL SUCCINATE ER 100 MG PO TB24: 100.0000 mg | ORAL_TABLET | Freq: Every day | ORAL | 5 refills | Status: DC

## 2020-09-28 DIAGNOSIS — G4733 Obstructive sleep apnea (adult) (pediatric): Secondary | ICD-10-CM | POA: Diagnosis not present

## 2020-10-08 ENCOUNTER — Other Ambulatory Visit: Payer: Self-pay | Admitting: Family Medicine

## 2020-10-16 ENCOUNTER — Encounter: Payer: Self-pay | Admitting: Family Medicine

## 2020-10-16 ENCOUNTER — Ambulatory Visit (INDEPENDENT_AMBULATORY_CARE_PROVIDER_SITE_OTHER): Payer: Medicare Other | Admitting: Family Medicine

## 2020-10-16 ENCOUNTER — Other Ambulatory Visit: Payer: Self-pay

## 2020-10-16 VITALS — BP 110/76 | HR 85 | Temp 97.9°F | Resp 20 | Ht 72.0 in | Wt 370.4 lb

## 2020-10-16 DIAGNOSIS — Z0000 Encounter for general adult medical examination without abnormal findings: Secondary | ICD-10-CM | POA: Diagnosis not present

## 2020-10-16 DIAGNOSIS — E1140 Type 2 diabetes mellitus with diabetic neuropathy, unspecified: Secondary | ICD-10-CM | POA: Diagnosis not present

## 2020-10-16 DIAGNOSIS — I4819 Other persistent atrial fibrillation: Secondary | ICD-10-CM

## 2020-10-16 DIAGNOSIS — K760 Fatty (change of) liver, not elsewhere classified: Secondary | ICD-10-CM | POA: Diagnosis not present

## 2020-10-16 DIAGNOSIS — Z125 Encounter for screening for malignant neoplasm of prostate: Secondary | ICD-10-CM

## 2020-10-16 DIAGNOSIS — Z794 Long term (current) use of insulin: Secondary | ICD-10-CM | POA: Diagnosis not present

## 2020-10-16 DIAGNOSIS — I10 Essential (primary) hypertension: Secondary | ICD-10-CM

## 2020-10-16 NOTE — Progress Notes (Signed)
Complete physical exam    Patient: Taylor [name], Male    DOB: 08-26-1955, 65 y.o.   MRN: [id number] Visit Date: 10/16/2020  Today's Provider: Lelon Huh, MD    Subjective    ARTHOR GORTER is a 65 y.o. male who presents today for his complete physical exam.  He reports consuming a regular diet. The patient does not participate in regular exercise at present. He generally feels fairly well. He reports sleeping fairly well. He does not have additional problems to discuss today.   HPI Hypertension, follow-up  BP Readings from Last 3 Encounters:  10/16/20 110/76  09/12/20 120/70  09/04/20 110/82   Wt Readings from Last 3 Encounters:  10/16/20 (!) 370 lb 6.4 oz (168 kg)  09/12/20 (!) 360 lb (163.3 kg)  09/04/20 (!) 359 lb 8 oz (163.1 kg)     He was last seen for hypertension 5 months ago.  BP at that visit was 134/82. Management since that visit includes continuing same medication.  He reports good compliance with treatment. He is not having side effects.  He is following a Regular diet. He is not exercising. He does not smoke.  Use of agents associated with hypertension: none.   Outside blood pressures are not checked. Symptoms: No chest pain No chest pressure  No palpitations No syncope  No dyspnea No orthopnea  No paroxysmal nocturnal dyspnea No lower extremity edema   Pertinent labs: Lab Results  Component Value Date   CHOL 140 09/25/2019   HDL 46 09/25/2019   LDLCALC 75 09/25/2019   TRIG 106 09/25/2019   CHOLHDL 3.0 09/25/2019   Lab Results  Component Value Date   NA 137 08/08/2020   K 4.2 08/08/2020   CREATININE 1.18 08/08/2020   GFRNONAA 61 03/25/2020   GFRAA 71 03/25/2020   GLUCOSE 180 (H) 08/08/2020     The 10-year ASCVD risk score Mikey Bussing DC Jr., et al., 2013) is: 14.6%   ---------------------------------------------------------------------------------------------------  Diabetes Mellitus Type II, Follow-up  Lab Results   Component Value Date   HGBA1C 6.9 (A) 05/13/2020   HGBA1C 6.9 (A) 01/30/2020   HGBA1C 6.9 (H) 09/25/2019   Wt Readings from Last 3 Encounters:  10/16/20 (!) 370 lb 6.4 oz (168 kg)  09/12/20 (!) 360 lb (163.3 kg)  09/04/20 (!) 359 lb 8 oz (163.1 kg)   Last seen for diabetes 5 months ago.  Management since then includes continuing same medications. He reports good compliance with treatment. Patient states that the cost of Wilder Glade has increased in price and is now too expensive for patient to afford.  He is not having side effects.  Symptoms: No fatigue No foot ulcerations  No appetite changes No nausea  No paresthesia of the feet  No polydipsia  Yes polyuria No visual disturbances   No vomiting     Home blood sugar records: fasting range: 125-130  Episodes of hypoglycemia? No    Current insulin regiment: none Most Recent Eye Exam: not UTD, but scheduled in June at the [facility].  Current exercise: none Current diet habits: on average, 3 meals per day  Pertinent Labs: Lab Results  Component Value Date   CHOL 140 09/25/2019   HDL 46 09/25/2019   LDLCALC 75 09/25/2019   TRIG 106 09/25/2019   CHOLHDL 3.0 09/25/2019   Lab Results  Component Value Date   NA 137 08/08/2020   K 4.2 08/08/2020   CREATININE 1.18 08/08/2020   GFRNONAA 61 03/25/2020  GFRAA 71 03/25/2020   GLUCOSE 180 (H) 08/08/2020     ---------------------------------------------------------------------------------------------------     Medications: Outpatient Medications Prior to Visit  Medication Sig  . apixaban (ELIQUIS) 5 MG TABS tablet Take 1 tablet (5 mg total) by mouth 2 (two) times daily.  . dapagliflozin propanediol (FARXIGA) 10 MG TABS tablet Take 1 tablet (10 mg total) by mouth daily before breakfast.  . glipiZIDE (GLUCOTROL XL) 10 MG 24 hr tablet TAKE 1 TABLET BY MOUTH ONCE DAILY.  Marland Kitchen glucose blood (ONETOUCH ULTRA) test strip Use to check sugar daily for type 2 diabetes E11.9  .  HYDROmorphone (DILAUDID) 4 MG tablet Take 4 mg by mouth in the morning, at noon, in the evening, and at bedtime. Must be name brand  . lisinopril (ZESTRIL) 10 MG tablet TAKE 1 TABLET BY MOUTH ONCE DAILY.  . metFORMIN (GLUCOPHAGE) 1000 MG tablet Take 1 tablet (1,000 mg total) by mouth 2 (two) times daily with a meal.  . metoprolol succinate (TOPROL-XL) 100 MG 24 hr tablet Take 1 tablet (100 mg total) by mouth daily. Take with or immediately following a meal.  . OXYCONTIN 20 MG 12 hr tablet Take 20 mg by mouth in the morning and at bedtime. Take 20 mg by mouth 12 noon and 2400  . OXYCONTIN 30 MG 12 hr tablet Take 30 mg by mouth in the morning and at bedtime. Take 30 mg by mouth at 0500 and 1730  . pioglitazone (ACTOS) 45 MG tablet TAKE 1 TABLET BY MOUTH ONCE DAILY.  Marland Kitchen potassium chloride SA (KLOR-CON) 20 MEQ tablet Take 1 tablet (20 mEq total) by mouth daily.  . pregabalin (LYRICA) 100 MG capsule Take 100 mg by mouth 2 (two) times daily.  . simvastatin (ZOCOR) 40 MG tablet TAKE 1 TABLET BY MOUTH ONCE DAILY.  Marland Kitchen torsemide (DEMADEX) 20 MG tablet Take 20 mg by mouth 2 (two) times daily.  . vitamin B-12 (CYANOCOBALAMIN) 1000 MCG tablet Take 1,000 mcg by mouth daily.  Marland Kitchen zolpidem (AMBIEN CR) 12.5 MG CR tablet Take 12.5 mg by mouth at bedtime.   Marland Kitchen NARCAN 4 MG/0.1ML LIQD nasal spray kit Place 0.4 mg into the nose as needed (accidental overdose). (Patient not taking: Reported on 10/16/2020)   No facility-administered medications prior to visit.    Allergies  Allergen Reactions  . Other Itching    Pain medication/ Must take brand named CAN NOT take generic      Review of Systems  Constitutional: Negative for appetite change, chills, fatigue and fever.  HENT: Positive for hearing loss. Negative for congestion, ear pain, nosebleeds and trouble swallowing.   Eyes: Negative for pain and visual disturbance.  Respiratory: Negative for cough, chest tightness and shortness of breath.   Cardiovascular:  Negative for chest pain, palpitations and leg swelling.  Gastrointestinal: Positive for constipation. Negative for abdominal pain, blood in stool, diarrhea, nausea and vomiting.  Endocrine: Positive for polyuria. Negative for polydipsia and polyphagia.  Genitourinary: Negative for dysuria and flank pain.  Musculoskeletal: Negative for arthralgias, back pain, joint swelling, myalgias and neck stiffness.  Skin: Negative for color change, rash and wound.  Neurological: Negative for dizziness, tremors, seizures, speech difficulty, weakness, light-headedness and headaches.  Psychiatric/Behavioral: Negative for behavioral problems, confusion, decreased concentration, dysphoric mood and sleep disturbance. The patient is not nervous/anxious.   All other systems reviewed and are negative.       Objective    Vitals: BP 110/76 (BP Location: Left Arm, Patient Position: Sitting, Cuff Size: Large)  Pulse 85   Temp 97.9 F (36.6 C) (Temporal)   Resp 20   Ht 6' (1.829 m)   Wt (!) 370 lb 6.4 oz (168 kg)   BMI 50.24 kg/m    Physical Exam  General Appearance:    Severely obese male. Alert, cooperative, in no acute distress, appears stated age  Head:    Normocephalic, without obvious abnormality, atraumatic  Eyes:    PERRL, conjunctiva/corneas clear, EOM's intact, fundi    benign, both eyes       Ears:    Normal TM's and external ear canals, both ears  Neck:   Supple, symmetrical, trachea midline, no adenopathy;       thyroid:  No enlargement/tenderness/nodules; no carotid   bruit or JVD  Back:     Symmetric, no curvature, ROM normal, no CVA tenderness  Lungs:     Clear to auscultation bilaterally, respirations unlabored  Chest wall:    No tenderness or deformity  Heart:    Normal heart rate. Irregularly irregular rhythm. No murmurs, rubs, or gallops.  S1 and S2 normal  Abdomen:     Soft, non-tender, bowel sounds active all four quadrants,    no masses, no organomegaly  Genitalia:    deferred   Rectal:    deferred  Extremities:   All extremities are intact. No cyanosis or edema  Pulses:   2+ and symmetric all extremities  Skin:   Skin color, texture, turgor normal, no rashes or lesions  Lymph nodes:   Cervical, supraclavicular, and axillary nodes normal  Neurologic:   CNII-XII intact. Normal strength, sensation and reflexes      throughout       Assessment & Plan     1. Annual physical exam Stable. Scheduled for exam in June. FOBT due in October.   2. Prostate cancer screening  - PSA Total (Reflex To Free) ([facility] only)  3. Hypertension, essential, benign Well controlled.  Continue current medications.   - CBC  4. Persistent atrial fibrillation (HCC) Continue NOAC, rate control and regular cardiology follow up   5. Type 2 diabetes mellitus with diabetic neuropathy, with long-term current use of insulin ([place]) One month samples Farxiga since he is [place].  - Lipid panel - Hemoglobin A1c  6. Fatty liver  - CBC - Comprehensive metabolic panel  Follow up diabetes 4 months.    The entirety of the information documented in the History of Present Illness, Review of Systems and Physical Exam were personally obtained by me. Portions of this information were initially documented by the CMA and reviewed by me for thoroughness and accuracy.      Lelon Huh, MD  [facility] [phone] (phone) [phone] (fax)  [place]

## 2020-10-16 NOTE — Progress Notes (Signed)
Annual Wellness Visit     Patient: Taylor [name], Male    DOB: 05/12/1956, 66 y.o.   MRN: [id number] Visit Date: 10/16/2020  Today's Provider: Lelon Huh, MD    Subjective    Taylor [name] is a 65 y.o. male who presents today for his Annual Wellness Visit.      Medications: Outpatient Medications Prior to Visit  Medication Sig  . apixaban (ELIQUIS) 5 MG TABS tablet Take 1 tablet (5 mg total) by mouth 2 (two) times daily.  . dapagliflozin propanediol (FARXIGA) 10 MG TABS tablet Take 1 tablet (10 mg total) by mouth daily before breakfast.  . glipiZIDE (GLUCOTROL XL) 10 MG 24 hr tablet TAKE 1 TABLET BY MOUTH ONCE DAILY.  Marland Kitchen glucose blood (ONETOUCH ULTRA) test strip Use to check sugar daily for type 2 diabetes E11.9  . HYDROmorphone (DILAUDID) 4 MG tablet Take 4 mg by mouth in the morning, at noon, in the evening, and at bedtime. Must be name brand  . lisinopril (ZESTRIL) 10 MG tablet TAKE 1 TABLET BY MOUTH ONCE DAILY.  . metFORMIN (GLUCOPHAGE) 1000 MG tablet Take 1 tablet (1,000 mg total) by mouth 2 (two) times daily with a meal.  . metoprolol succinate (TOPROL-XL) 100 MG 24 hr tablet Take 1 tablet (100 mg total) by mouth daily. Take with or immediately following a meal.  . OXYCONTIN 20 MG 12 hr tablet Take 20 mg by mouth in the morning and at bedtime. Take 20 mg by mouth 12 noon and 2400  . OXYCONTIN 30 MG 12 hr tablet Take 30 mg by mouth in the morning and at bedtime. Take 30 mg by mouth at 0500 and 1730  . pioglitazone (ACTOS) 45 MG tablet TAKE 1 TABLET BY MOUTH ONCE DAILY.  Marland Kitchen potassium chloride SA (KLOR-CON) 20 MEQ tablet Take 1 tablet (20 mEq total) by mouth daily.  . pregabalin (LYRICA) 100 MG capsule Take 100 mg by mouth 2 (two) times daily.  . simvastatin (ZOCOR) 40 MG tablet TAKE 1 TABLET BY MOUTH ONCE DAILY.  Marland Kitchen torsemide (DEMADEX) 20 MG tablet Take 20 mg by mouth 2 (two) times daily.  . vitamin B-12 (CYANOCOBALAMIN) 1000 MCG tablet Take 1,000 mcg by mouth  daily.  Marland Kitchen zolpidem (AMBIEN CR) 12.5 MG CR tablet Take 12.5 mg by mouth at bedtime.   Marland Kitchen NARCAN 4 MG/0.1ML LIQD nasal spray kit Place 0.4 mg into the nose as needed (accidental overdose). (Patient not taking: Reported on 10/16/2020)   No facility-administered medications prior to visit.    Allergies  Allergen Reactions  . Other Itching    Pain medication/ Must take brand named CAN NOT take generic    Patient Care Team: Birdie Sons, MD as PCP - General (Family Medicine) Kate Sable, MD as PCP - Cardiology (Cardiology) Vickie Epley, MD as PCP - Electrophysiology (Cardiology) Thalia Bloodgood, MD as Referring Physician (Anesthesiology) Darliss Ridgel as Student Nurse (Student) Birder Robson, MD as Referring Physician (Ophthalmology) Chesley Mires, MD as Consulting Physician (Pulmonary Disease)       Objective     Most recent functional status assessment: In your present state of health, do you have any difficulty performing the following activities: 10/16/2020  Hearing? Y  Comment -  Vision? N  Comment -  Difficulty concentrating or making decisions? Y  Walking or climbing stairs? Y  Dressing or bathing? N  Doing errands, shopping? N  Some recent data might be hidden   Most recent  fall risk assessment: Fall Risk  10/16/2020  Falls in the past year? 0  Number falls in past yr: 0  Injury with Fall? 0  Follow up Falls evaluation completed    Most recent depression screenings: PHQ 2/9 Scores 10/16/2020 07/01/2020  PHQ - 2 Score 3 1  PHQ- 9 Score 10 -   Most recent cognitive screening: No flowsheet data found. Most recent Audit-C alcohol use screening Alcohol Use Disorder Test (AUDIT) 10/16/2020  1. How often do you have a drink containing alcohol? 0  2. How many drinks containing alcohol do you have on a typical day when you are drinking? 0  3. How often do you have six or more drinks on one occasion? 0  AUDIT-C Score 0   A score of 3  or more in women, and 4 or more in men indicates increased risk for alcohol abuse, EXCEPT if all of the points are from question 1   No results found for any visits on 10/16/20.  Assessment & Plan     Annual wellness visit done today including the all of the following: Reviewed patient's Family Medical History Reviewed and updated list of patient's medical providers Assessment of cognitive impairment was done Assessed patient's functional ability Established a written schedule for health screening [place] Completed and Reviewed  Exercise Activities and Dietary recommendations Goals    . LIFESTYLE - DECREASE FALLS RISK     Recommend to remove any items from the home that may cause slips or trips.       Immunization History  Administered Date(s) Administered  . Influenza,inj,Quad PF,6+ Mos 03/13/2019, 05/13/2020  . Pneumococcal Polysaccharide-23 11/27/2013  . Tdap 11/25/2012    Health Maintenance  Topic Date Due  . COVID-19 Vaccine (1) Never done  . HIV Screening  Never done  . COLONOSCOPY (Pts 45-42yr Insurance coverage will need to be confirmed)  Never done  . FOOT EXAM  11/15/2015  . OPHTHALMOLOGY EXAM  04/02/2019  . HEMOGLOBIN A1C  11/11/2020  . INFLUENZA VACCINE  01/06/2021  . COLON CANCER SCREENING ANNUAL FOBT  03/14/2021  . TETANUS/TDAP  11/26/2022  . PNEUMOCOCCAL POLYSACCHARIDE VACCINE AGE 9-64 HIGH RISK  Completed  . Hepatitis C Screening  Completed  . HPV VACCINES  Aged Out     Discussed health benefits of physical activity, and encouraged him to engage in regular exercise appropriate for his age and condition.         The entirety of the information documented in the History of Present Illness, Review of Systems and Physical Exam were personally obtained by me. Portions of this information were initially documented by the CMA and reviewed by me for thoroughness and accuracy.      DLelon Huh MD  [phone](phone) [phone](fax)  [place]

## 2020-10-17 LAB — LIPID PANEL
Chol/HDL Ratio: 3.8 ratio (ref 0.0–5.0)
Cholesterol, Total: 177 mg/dL (ref 100–199)
HDL: 46 mg/dL (ref >39)
LDL Chol Calc (NIH): 102 mg/dL — ABNORMAL HIGH (ref 0–99)
Triglycerides: 164 mg/dL — ABNORMAL HIGH (ref 0–149)
VLDL Cholesterol Cal: 29 mg/dL (ref 5–40)

## 2020-10-17 LAB — COMPREHENSIVE METABOLIC PANEL
ALT: 10 IU/L (ref 0–44)
AST: 14 IU/L (ref 0–40)
Albumin/Globulin Ratio: 1.1 — ABNORMAL LOW (ref 1.2–2.2)
Albumin: 4 g/dL (ref 3.8–4.8)
Alkaline Phosphatase: 84 IU/L (ref 44–121)
BUN/Creatinine Ratio: 14 (ref 10–24)
BUN: 16 mg/dL (ref 8–27)
Bilirubin Total: 0.4 mg/dL (ref 0.0–1.2)
CO2: 27 mmol/L (ref 20–29)
Calcium: 9.3 mg/dL (ref 8.6–10.2)
Chloride: 97 mmol/L (ref 96–106)
Creatinine, Ser: 1.13 mg/dL (ref 0.76–1.27)
Globulin, Total: 3.7 g/dL (ref 1.5–4.5)
Glucose: 122 mg/dL — ABNORMAL HIGH (ref 65–99)
Potassium: 4.6 mmol/L (ref 3.5–5.2)
Sodium: 138 mmol/L (ref 134–144)
Total Protein: 7.7 g/dL (ref 6.0–8.5)
eGFR: 73 mL/min/{1.73_m2} (ref >59)

## 2020-10-17 LAB — CBC
Hematocrit: 41.4 % (ref 37.5–51.0)
Hemoglobin: 13.3 g/dL (ref 13.0–17.7)
MCH: 28.1 pg (ref 26.6–33.0)
MCHC: 32.1 g/dL (ref 31.5–35.7)
MCV: 88 fL (ref 79–97)
Platelets: 206 10*3/uL (ref 150–450)
RBC: 4.73 x10E6/uL (ref 4.14–5.80)
RDW: 15.1 % (ref 11.6–15.4)
WBC: 10.1 10*3/uL (ref 3.4–10.8)

## 2020-10-17 LAB — PSA TOTAL (REFLEX TO FREE): Prostate Specific Ag, Serum: 0.3 ng/mL (ref 0.0–4.0)

## 2020-10-17 LAB — HEMOGLOBIN A1C
Est. average glucose Bld gHb Est-mCnc: 169 mg/dL
Hgb A1c MFr Bld: 7.5 % — ABNORMAL HIGH (ref 4.8–5.6)

## 2020-10-21 ENCOUNTER — Other Ambulatory Visit: Payer: Self-pay | Admitting: Family Medicine

## 2020-10-21 DIAGNOSIS — E1165 Type 2 diabetes mellitus with hyperglycemia: Secondary | ICD-10-CM

## 2020-10-28 DIAGNOSIS — G4733 Obstructive sleep apnea (adult) (pediatric): Secondary | ICD-10-CM | POA: Diagnosis not present

## 2020-11-07 DIAGNOSIS — E119 Type 2 diabetes mellitus without complications: Secondary | ICD-10-CM | POA: Diagnosis not present

## 2020-11-07 LAB — HM DIABETES EYE EXAM

## 2020-11-25 ENCOUNTER — Other Ambulatory Visit: Payer: Self-pay | Admitting: Family Medicine

## 2020-11-25 NOTE — Telephone Encounter (Signed)
Requested medication (s) are due for refill today: Yes  Requested medication (s) are on the active medication list: Yes  Last refill:  10/30/19  Future visit scheduled: Yes  Notes to clinic:  Unable to refill per protocol, Rx expired.      Requested Prescriptions  Pending Prescriptions Disp Refills   simvastatin (ZOCOR) 40 MG tablet [Pharmacy Med Name: SIMVASTATIN 40 MG TABLET] 90 tablet 0    Sig: TAKE 1 TABLET BY MOUTH ONCE DAILY.      Cardiovascular:  Antilipid - Statins Failed - 11/25/2020  3:36 PM      Failed - LDL in normal range and within 360 days    LDL Chol Calc (NIH)  Date Value Ref Range Status  10/16/2020 102 (H) 0 - 99 mg/dL Final          Failed - Triglycerides in normal range and within 360 days    Triglycerides  Date Value Ref Range Status  10/16/2020 164 (H) 0 - 149 mg/dL Final          Passed - Total Cholesterol in normal range and within 360 days    Cholesterol, Total  Date Value Ref Range Status  10/16/2020 177 100 - 199 mg/dL Final          Passed - HDL in normal range and within 360 days    HDL  Date Value Ref Range Status  10/16/2020 46 >39 mg/dL Final          Passed - Patient is not pregnant      Passed - Valid encounter within last 12 months    Recent Outpatient Visits           1 month ago Annual physical exam   [facility] Malva Limes, MD   6 months ago Type 2 diabetes mellitus with diabetic neuropathy, with long-term current use of insulin [facility])   [facility] Malva Limes, MD   10 months ago Type 2 diabetes mellitus with diabetic neuropathy, with long-term current use of insulin [facility])   [facility] Malva Limes, MD   11 months ago Edema, unspecified type   [facility] Chrismon, Jodell Cipro, PA-C   12 months ago Edema, unspecified type   [facility], Jodell Cipro, PA-C       Future Appointments             In 2 weeks Lanier Prude, MD [facility], [facility]   In 2 months Fisher, Demetrios Isaacs, MD [facility], [facility]

## 2020-11-27 ENCOUNTER — Other Ambulatory Visit: Payer: Medicare Other

## 2020-11-28 DIAGNOSIS — G4733 Obstructive sleep apnea (adult) (pediatric): Secondary | ICD-10-CM | POA: Diagnosis not present

## 2020-12-10 ENCOUNTER — Telehealth: Payer: Self-pay

## 2020-12-10 NOTE — Telephone Encounter (Signed)
He can have a months supply of the [place].

## 2020-12-10 NOTE — Telephone Encounter (Signed)
Copied from CRM [id number]. Topic: General - Inquiry >> Dec 10, 2020 12:24 PM Aretta Nip wrote: apixaban (ELIQUIS) 5 MG TABS tablet   dapagliflozin propanediol (FARXIGA) 10 MG TABS tablet  pt is calling as is out of these 2 meds and is requesting a cb as wanting samples, states Dr Carmon Ginsberg usually gives samples if can not afford/ FU at [phone]

## 2020-12-10 NOTE — Telephone Encounter (Signed)
Is it okay to give pt samples.  We have Farxiga 10mg , but we only have eliquis 2.5mg .   Thanks,   - 

## 2020-12-11 ENCOUNTER — Ambulatory Visit: Payer: Medicare Other | Admitting: Cardiology

## 2020-12-11 NOTE — Telephone Encounter (Signed)
Left message advising pt farxiga is at the front desk for him to pick up.   Thanks,  -Vernona Rieger

## 2020-12-14 ENCOUNTER — Other Ambulatory Visit: Payer: Self-pay | Admitting: Family Medicine

## 2020-12-14 NOTE — Telephone Encounter (Signed)
Next OV 02/18/21. Approved per protocol.  Requested Prescriptions  Pending Prescriptions Disp Refills  . metFORMIN (GLUCOPHAGE) 1000 MG tablet [Pharmacy Med Name: METFORMIN HCL 1,000 MG TABLET] 180 tablet 0    Sig: TAKE 1 TABLET BY MOUTH TWICE DAILY     Endocrinology:  Diabetes - Biguanides Passed - 12/14/2020  8:59 AM      Passed - Cr in normal range and within 360 days    Creatinine, Ser  Date Value Ref Range Status  10/16/2020 1.13 0.76 - 1.27 mg/dL Final   Creatinine, POC  Date Value Ref Range Status  03/31/2017 n/a mg/dL Final         Passed - HBA1C is between 0 and 7.9 and within 180 days    Hgb A1c MFr Bld  Date Value Ref Range Status  10/16/2020 7.5 (H) 4.8 - 5.6 % Final    Comment:             Prediabetes: 5.7 - 6.4          Diabetes: >6.4          Glycemic control for adults with diabetes: <7.0          Passed - eGFR in normal range and within 360 days    GFR calc Af Amer  Date Value Ref Range Status  03/25/2020 71 >59 mL/min/1.73 Final    Comment:    **In accordance with recommendations from the NKF-ASN Task force,**   [facility] is in the process of updating its eGFR calculation to the   2021 CKD-EPI creatinine equation that estimates kidney function   without a race variable.    GFR calc non Af Amer  Date Value Ref Range Status  03/25/2020 61 >59 mL/min/1.73 Final   eGFR  Date Value Ref Range Status  10/16/2020 73 >59 mL/min/1.73 Final         Passed - Valid encounter within last 6 months    Recent Outpatient Visits          1 month ago Annual physical exam   [facility] Birdie Sons, MD   7 months ago Type 2 diabetes mellitus with diabetic neuropathy, with long-term current use of insulin [facility])   [facility] Birdie Sons, MD   10 months ago Type 2 diabetes mellitus with diabetic neuropathy, with long-term current use of insulin [facility])   [facility] Birdie Sons, MD   1 year ago Edema, unspecified  type   [facility], Vickki Muff, PA-C   1 year ago Edema, unspecified type   [place], Vickki Muff, PA-C      Future Appointments            In 2 weeks Vickie Epley, MD [facility], [facility]   In 2 months Caryn Section, Kirstie Peri, MD [facility], [place]

## 2020-12-16 ENCOUNTER — Other Ambulatory Visit: Payer: Self-pay

## 2020-12-16 ENCOUNTER — Ambulatory Visit (INDEPENDENT_AMBULATORY_CARE_PROVIDER_SITE_OTHER): Payer: Medicare Other

## 2020-12-16 DIAGNOSIS — I5020 Unspecified systolic (congestive) heart failure: Secondary | ICD-10-CM | POA: Diagnosis not present

## 2020-12-16 DIAGNOSIS — Z6841 Body Mass Index (BMI) 40.0 and over, adult: Secondary | ICD-10-CM

## 2020-12-16 DIAGNOSIS — I4819 Other persistent atrial fibrillation: Secondary | ICD-10-CM | POA: Diagnosis not present

## 2020-12-16 LAB — ECHOCARDIOGRAM COMPLETE
Area-P 1/2: 3.91 cm2
S' Lateral: 4.5 cm

## 2020-12-16 MED ORDER — PERFLUTREN LIPID MICROSPHERE
1.0000 mL | INTRAVENOUS | Status: AC | PRN
  Administered 2020-12-16: 2 mL via INTRAVENOUS

## 2020-12-26 ENCOUNTER — Other Ambulatory Visit: Payer: Self-pay | Admitting: Family Medicine

## 2020-12-26 DIAGNOSIS — I10 Essential (primary) hypertension: Secondary | ICD-10-CM

## 2020-12-28 DIAGNOSIS — G4733 Obstructive sleep apnea (adult) (pediatric): Secondary | ICD-10-CM | POA: Diagnosis not present

## 2020-12-31 NOTE — Progress Notes (Signed)
Electrophysiology Office Follow up Visit Note:    Date:  01/01/2021   ID:  Taylor [name], DOB 06-01-1956, MRN [id number]  PCP:  Taylor Sons, MD  [facility] Cardiologist:  Taylor Sable, MD  [facility] Electrophysiologist:  Taylor Epley, MD    Interval History:    Taylor [name] is a 65 y.o. male who presents for a follow up visit. They were last seen in clinic September 04, 2020 for persistent atrial fibrillation.  He had recently failed cardioversion at the time of our last appointment.  During her last appointment I recommended loading with Tikosyn to try to achieve rhythm control given his left ventricular dysfunction but he was hesitant to start a medication and he wanted to work on exercise and wearing his CPAP first.  He presents today for follow-up.  He last saw Dr. Garen [name] on September 12, 2020. Since his appointment with Dr. Garen [name] he did have an echocardiogram.  This showed a left ventricular function of 35 to 40%  He has previously tried to have a heart MRI of the brain but was unable to tolerate because of claustrophobia.  The patient tells me that he struggles with taking medications and is worried about adherence to the twice daily schedule of dofetilide.  He is very hesitant about admission to the hospital for drug loading.  He continues to be fatigued frequently.  He is unsure whether or not this related to his atrial fibrillation or his other medications (including pain medications).  He is alarmed during the discussion of his ejection fraction being about half that of a normal heart.    Past Medical History:  Diagnosis Date   Arthritis    Broken leg 02/07/2019   Depression    Diabetes mellitus without complication ([place])    Hypertension     Past Surgical History:  Procedure Laterality Date   BACK SURGERY     CARDIOVERSION N/A 08/14/2020   Procedure: CARDIOVERSION;  Surgeon: Taylor Sable, MD;  Location: [facility] ORS;  Service:  Cardiovascular;  Laterality: N/A;   LEFT HEART CATH AND CORONARY ANGIOGRAPHY Left 04/05/2020   Procedure: LEFT HEART CATH AND CORONARY ANGIOGRAPHY;  Surgeon: Taylor Hampshire, MD;  Location: [facility] [facility];  Service: Cardiovascular;  Laterality: Left;    Current Medications: Current Meds  Medication Sig   amiodarone (PACERONE) 200 MG tablet Take 2 tablets (400 mg total) by mouth 2 (two) times daily for 5 days, THEN 2 tablets (400 mg total) daily for 5 days, THEN 1 tablet (200 mg total) daily.   apixaban (ELIQUIS) 5 MG TABS tablet Take 1 tablet (5 mg total) by mouth 2 (two) times daily.   dapagliflozin propanediol (FARXIGA) 10 MG TABS tablet Take 1 tablet (10 mg total) by mouth daily before breakfast.   glipiZIDE (GLUCOTROL XL) 10 MG 24 hr tablet TAKE 1 TABLET BY MOUTH ONCE DAILY.   glucose blood (ONETOUCH ULTRA) test strip Use to check sugar daily for type 2 diabetes E11.9   HYDROmorphone (DILAUDID) 4 MG tablet Take 4 mg by mouth in the morning, at noon, in the evening, and at bedtime. Must be name brand   lisinopril (ZESTRIL) 10 MG tablet TAKE 1 TABLET BY MOUTH ONCE DAILY.   metFORMIN (GLUCOPHAGE) 1000 MG tablet TAKE 1 TABLET BY MOUTH TWICE DAILY   metoprolol succinate (TOPROL-XL) 100 MG 24 hr tablet Take 1 tablet (100 mg total) by mouth daily. Take with or immediately following a meal.   NARCAN 4 MG/0.1ML  LIQD nasal spray kit Place 0.4 mg into the nose as needed (accidental overdose).   OXYCONTIN 20 MG 12 hr tablet Take 20 mg by mouth in the morning and at bedtime. Take 20 mg by mouth 12 noon and 2400   OXYCONTIN 30 MG 12 hr tablet Take 30 mg by mouth in the morning and at bedtime. Take 30 mg by mouth at 0500 and 1730   pioglitazone (ACTOS) 45 MG tablet TAKE 1 TABLET BY MOUTH ONCE DAILY.   potassium chloride SA (KLOR-CON) 20 MEQ tablet Take 1 tablet (20 mEq total) by mouth daily.   pregabalin (LYRICA) 100 MG capsule Take 100 mg by mouth 2 (two) times daily.   simvastatin (ZOCOR) 40 MG  tablet TAKE 1 TABLET BY MOUTH ONCE DAILY.   torsemide (DEMADEX) 20 MG tablet Take 20 mg by mouth 2 (two) times daily.   vitamin B-12 (CYANOCOBALAMIN) 1000 MCG tablet Take 1,000 mcg by mouth daily.   zolpidem (AMBIEN CR) 12.5 MG CR tablet Take 12.5 mg by mouth at bedtime.      Allergies:   Other   Social History   Socioeconomic History   Marital status: Single    Spouse name: Not on file   Number of children: 1   Years of education: Not on file   Highest education level: Some college, no degree  Occupational History   Occupation: disability/retired  Tobacco Use   Smoking status: Never   Smokeless tobacco: Never  Vaping Use   Vaping Use: Never used  Substance and Sexual Activity   Alcohol use: No    Alcohol/week: 0.0 standard drinks   Drug use: No   Sexual activity: Not on file  Other Topics Concern   Not on file  Social History Narrative   Lives with Mother Taylor [name].    Social Determinants of Health   Financial Resource Strain: Not on file  Food Insecurity: Not on file  Transportation Needs: Not on file  Physical Activity: Not on file  Stress: Not on file  Social Connections: Not on file     Family History: The patient's family history includes Diabetes in his mother and sister.  ROS:   Please see the history of present illness.    All other systems reviewed and are negative.  EKGs/Labs/Other Studies Reviewed:    The following studies were reviewed today:  December 16, 2020 echo personally reviewed Left ventricular function difficult to assess, at least moderately decreased  EKG:  The ekg ordered today demonstrates atrial fibrillation with rapid ventricular rates with a ventricular rate of 103 bpm.  Frequent PVCs.  PVCs are monomorphic and have an inferior axis.  Recent Labs: 10/16/2020: ALT 10; BUN 16; Creatinine, Ser 1.13; Hemoglobin 13.3; Platelets 206; Potassium 4.6; Sodium 138  Recent Lipid Panel    Component Value Date/Time   CHOL 177 10/16/2020 1046    TRIG 164 (H) 10/16/2020 1046   HDL 46 10/16/2020 1046   CHOLHDL 3.8 10/16/2020 1046   LDLCALC 102 (H) 10/16/2020 1046    Physical Exam:    VS:  BP 100/70 (BP Location: Left Arm, Patient Position: Sitting, Cuff Size: Large)   Pulse (!) 103   Ht 6' (1.829 m)   Wt (!) 368 lb (166.9 kg)   BMI 49.91 kg/m     Wt Readings from Last 3 Encounters:  01/01/21 (!) 368 lb (166.9 kg)  10/16/20 (!) 370 lb 6.4 oz (168 kg)  09/12/20 (!) 360 lb (163.3 kg)     GEN:  Well nourished, well developed in no acute distress.  Morbidly obese HEENT: Normal NECK: No JVD; No carotid bruits LYMPHATICS: No lymphadenopathy CARDIAC: Irregularly irregular, tachycardic, no murmurs, rubs, gallops RESPIRATORY:  Clear to auscultation without rales, wheezing or rhonchi  ABDOMEN: Soft, non-tender, non-distended MUSCULOSKELETAL:  No edema; No deformity  SKIN: Warm and dry NEUROLOGIC:  Alert and oriented x 3 PSYCHIATRIC:  Normal affect   ASSESSMENT:    1. Persistent atrial fibrillation ([place])   2. NICM (nonischemic cardiomyopathy) ([place])   3. Morbid obesity with BMI of 40.0-44.9, adult ([place])   4. Primary hypertension    PLAN:    In order of problems listed above:   1. Persistent atrial fibrillation (HCC) Remains in atrial fibrillation.  Has previously failed cardioversion.  He is not a candidate for ablation given his BMI.  I discussed management options for him including continued conservative management with rate control versus antiarrhythmic drug initiation with cardioversion (dofetilide versus amiodarone).  I do not think he is a good candidate for dofetilide given concerns about medication adherence.  I will plan to bring him to the lab in 6 to 8 weeks after starting amiodarone today.  I can use the larger defibrillator in the lab if necessary to attempt to restore normal rhythm.  We will also plan for a TEE that day under general anesthesia in the lab on the table so that we can accurately assess his left  ventricular function via transgastric views and exclude left atrial/left atrial appendage thrombus.  We will initiate amiodarone 400 mg by mouth twice daily for 5 days followed by 400 mg by mouth once daily for 5 days followed by 200 mg by mouth thereafter.  2. NICM (nonischemic cardiomyopathy) (HCC) NYHA class III.  Last EF was about 35% although views from the chest wall were difficult.  We will plan to reassess his left ventricular function in the EP lab as above.  A rhythm control strategy is indicated given his known LV dysfunction and persistent atrial fibrillation.  ICD discussions deferred until we can maintain normal rhythm and reassess LV function.  3. Morbid obesity with BMI of 40.0-44.9, adult ([place]) Contributing to #1 and #2.  Weight loss encouraged.  4. Primary hypertension At goal today.  Continue medical therapy including Toprol-XL and torsemide.      Total time spent with patient today 50 minutes. This includes reviewing records, evaluating the patient and coordinating care.   Medication Adjustments/Labs and Tests Ordered: Current medicines are reviewed at length with the patient today.  Concerns regarding medicines are outlined above.  Orders Placed This Encounter  Procedures   EKG 12-Lead   Meds ordered this encounter  Medications   amiodarone (PACERONE) 200 MG tablet    Sig: Take 2 tablets (400 mg total) by mouth 2 (two) times daily for 5 days, THEN 2 tablets (400 mg total) daily for 5 days, THEN 1 tablet (200 mg total) daily.    Dispense:  110 tablet    Refill:  3     Signed, Lars Mage, MD, [facility], [facility] 01/01/2021 5:05 PM    Electrophysiology [facility]

## 2021-01-01 ENCOUNTER — Ambulatory Visit (INDEPENDENT_AMBULATORY_CARE_PROVIDER_SITE_OTHER): Payer: Medicare Other | Admitting: Cardiology

## 2021-01-01 ENCOUNTER — Other Ambulatory Visit: Payer: Self-pay

## 2021-01-01 ENCOUNTER — Encounter: Payer: Self-pay | Admitting: Cardiology

## 2021-01-01 VITALS — BP 100/70 | HR 103 | Ht 72.0 in | Wt 368.0 lb

## 2021-01-01 DIAGNOSIS — I10 Essential (primary) hypertension: Secondary | ICD-10-CM

## 2021-01-01 DIAGNOSIS — I4819 Other persistent atrial fibrillation: Secondary | ICD-10-CM

## 2021-01-01 DIAGNOSIS — Z6841 Body Mass Index (BMI) 40.0 and over, adult: Secondary | ICD-10-CM

## 2021-01-01 DIAGNOSIS — I428 Other cardiomyopathies: Secondary | ICD-10-CM

## 2021-01-01 MED ORDER — AMIODARONE HCL 200 MG PO TABS: ORAL_TABLET | ORAL | 3 refills | Status: DC

## 2021-01-01 NOTE — Patient Instructions (Signed)
Medication Instructions:  Your physician has recommended you make the following change in your medication:    START taking amiodarone 200 mg- TAKE 2 tablets by mouth TWICE a day for 5 days  THEN TAKE 2 tablets by mouth ONCE a day for 5 days THEN TAKE 1 tablet by mouth daily from then on  *If you need a refill on your cardiac medications before your next appointment, please call your pharmacy*  Lab Work: None ordered. If you have labs (blood work) drawn today and your tests are completely normal, you will receive your results only by: MyChart Message (if you have MyChart) OR A paper copy in the mail If you have any lab test that is abnormal or we need to change your treatment, we will call you to review the results.  Testing/Procedures: None ordered.  Follow-Up:  You will have a cardioversion in 8 weeks.  March 03, 2021 at [facility].   Bonita Quin are scheduled for a Cardioversion on March 03, 2021 with Dr. Lalla Brothers.  Please arrive at the [facility] (Main Entrance A) at [facility]: [address] [place], [place] [address] at 9:00 am(1 hour prior to procedure unless lab work is needed; if lab work is needed arrive 1.5 hours ahead)  DIET: Nothing to eat or drink after midnight except a sip of water with medications (see medication instructions below)  FYI: For your safety, and to allow [place] to monitor your vital signs accurately during the surgery/procedure we request that   if you have artificial nails, gel coating, SNS etc. Please have those removed prior to your surgery/procedure. Not having the nail coverings /polish removed may result in cancellation or delay of your surgery/procedure.   Medication Instructions: Take all your normal morning medications with a sip of water EXCEPT TORSEMIDE  Continue your anticoagulant: ELIQUIS You will need to continue your anticoagulant after your procedure until you  are told by your  Provider that it is safe to  stop   Labs: on arrival  You must have a responsible person to drive you home and stay in the waiting area during your procedure. Failure to do so could result in cancellation.  Bring your insurance cards.  *Special Note: Every effort is made to have your procedure done on time. Occasionally there are emergencies that occur at the hospital that may cause delays. Please be patient if a delay does occur.

## 2021-01-07 DIAGNOSIS — G894 Chronic pain syndrome: Secondary | ICD-10-CM | POA: Diagnosis not present

## 2021-01-08 ENCOUNTER — Other Ambulatory Visit: Payer: Self-pay | Admitting: Family Medicine

## 2021-01-08 ENCOUNTER — Telehealth: Payer: Self-pay

## 2021-01-08 NOTE — Telephone Encounter (Signed)
Please advise samples? 

## 2021-01-08 NOTE — Telephone Encounter (Signed)
Left detailed message advising samples are at front desk for pick-up.

## 2021-01-08 NOTE — Telephone Encounter (Signed)
Can have one months supply of Farxiga 10mg 

## 2021-01-08 NOTE — Telephone Encounter (Signed)
Copied from CRM [id number]. Topic: General - Other >> Jan 08, 2021  9:31 AM Gwenlyn Fudge wrote: Reason for CRM: Pt called stating that he is in need of the samples of farxiga. Pt states that he is completely out of this medication. Please advise.

## 2021-01-24 ENCOUNTER — Other Ambulatory Visit: Payer: Self-pay | Admitting: Family Medicine

## 2021-01-24 DIAGNOSIS — E1165 Type 2 diabetes mellitus with hyperglycemia: Secondary | ICD-10-CM

## 2021-01-24 NOTE — Telephone Encounter (Signed)
Valid encounter within 6 months . Future visit in 3 weeks

## 2021-01-27 ENCOUNTER — Telehealth: Payer: Self-pay

## 2021-01-27 NOTE — Telephone Encounter (Signed)
-----   Message from Wiliam Ke, RN sent at 01/27/2021  7:55 AM EDT ----- Regarding: FW: DCCV 9/26 Please schedule for 4 weeks post cardioversion on 03/03/21.  Boneta Lucks ----- Message ----- From: Lanier Prude, MD Sent: 01/25/2021   9:51 AM EDT To: Wiliam Ke, RN Subject: RE: DCCV 9/26                                  Lets put something on the books 4 weeks after his cardioversion. CL  ----- Message ----- From: Wiliam Ke, RN Sent: 01/13/2021   3:13 PM EDT To: Wiliam Ke, RN, Lanier Prude, MD Subject: DCCV 9/26                                      You are cardioverting Mr. Saleeby on 9/26.  I did not put a follow appt in my notes.  Do you want to decide when you cardiovert him?  Or do you want me to put something on the books?  Boneta Lucks

## 2021-01-27 NOTE — Telephone Encounter (Signed)
Left message to schedule 4 weeks post cardioversion (9/26).

## 2021-02-05 ENCOUNTER — Telehealth: Payer: Self-pay

## 2021-02-05 DIAGNOSIS — Z794 Long term (current) use of insulin: Secondary | ICD-10-CM

## 2021-02-05 DIAGNOSIS — E1140 Type 2 diabetes mellitus with diabetic neuropathy, unspecified: Secondary | ICD-10-CM

## 2021-02-05 NOTE — Telephone Encounter (Signed)
Copied from CRM [id number]. Topic: General - Other >> Feb 05, 2021  3:45 PM Traci Sermon wrote: Reason for CRM: Pt called in to see about getting some more of dapagliflozin propanediol (FARXIGA) 10 MG TABS tablet, please advise.

## 2021-02-06 MED ORDER — DAPAGLIFLOZIN PROPANEDIOL 10 MG PO TABS: 10.0000 mg | ORAL_TABLET | Freq: Every day | ORAL | 12 refills | Status: DC

## 2021-02-11 NOTE — Telephone Encounter (Signed)
Patient is calling to see if there are any samples of dapagliflozin propanediol (FARXIGA) 10 MG TABS tablet [[id number]]   Please advise CB- [phone]

## 2021-02-12 NOTE — Telephone Encounter (Signed)
I think there are two sample boxes in there he can have.

## 2021-02-12 NOTE — Telephone Encounter (Signed)
Is it okay to leave samples if available?   Thanks,   -Ilana Prezioso  

## 2021-02-12 NOTE — Telephone Encounter (Signed)
Samples are at the front desk.  Pt advised.   Thanks,   -Jaspal Pultz  

## 2021-02-12 NOTE — Telephone Encounter (Signed)
Patient is calling back about getting samples of dapagliflozin propanediol (FARXIGA) 10 MG TABS . Please call back and leave message if he can pick up this sample.

## 2021-02-18 ENCOUNTER — Other Ambulatory Visit: Payer: Self-pay

## 2021-02-18 ENCOUNTER — Ambulatory Visit (INDEPENDENT_AMBULATORY_CARE_PROVIDER_SITE_OTHER): Payer: Medicare Other | Admitting: Family Medicine

## 2021-02-18 ENCOUNTER — Encounter: Payer: Self-pay | Admitting: Family Medicine

## 2021-02-18 VITALS — BP 139/88 | HR 125 | Wt 370.0 lb

## 2021-02-18 DIAGNOSIS — Z794 Long term (current) use of insulin: Secondary | ICD-10-CM

## 2021-02-18 DIAGNOSIS — K5900 Constipation, unspecified: Secondary | ICD-10-CM

## 2021-02-18 DIAGNOSIS — R5383 Other fatigue: Secondary | ICD-10-CM

## 2021-02-18 DIAGNOSIS — I10 Essential (primary) hypertension: Secondary | ICD-10-CM

## 2021-02-18 DIAGNOSIS — E1140 Type 2 diabetes mellitus with diabetic neuropathy, unspecified: Secondary | ICD-10-CM | POA: Diagnosis not present

## 2021-02-18 LAB — POCT GLYCOSYLATED HEMOGLOBIN (HGB A1C)
Est. average glucose Bld gHb Est-mCnc: 143
Hemoglobin A1C: 6.6 % — AB (ref 4.0–5.6)

## 2021-02-18 NOTE — Progress Notes (Signed)
Established patient visit   Patient: Taylor [name]   DOB: December 05, 1955   65 y.o. Male  MRN: [id number] Visit Date: 02/18/2021  Today's healthcare provider: Lelon Huh, MD   No chief complaint on file.  Subjective    HPI  Diabetes Mellitus Type II, Follow-up  Lab Results  Component Value Date   HGBA1C 7.5 (H) 10/16/2020   HGBA1C 6.9 (A) 05/13/2020   HGBA1C 6.9 (A) 01/30/2020   Wt Readings from Last 3 Encounters:  02/18/21 (!) 370 lb (167.8 kg)  01/01/21 (!) 368 lb (166.9 kg)  10/16/20 (!) 370 lb 6.4 oz (168 kg)   Last seen for diabetes 4 months ago.  Management since then includes no changes. He reports excellent compliance with treatment. He is not having side effects.  Symptoms: No fatigue No foot ulcerations  No appetite changes No nausea  No paresthesia of the feet  No polydipsia  No polyuria No visual disturbances   No vomiting     Home blood sugar records: fasting range: low to mid 100's  Episodes of hypoglycemia? No    Current insulin regiment: none Most Recent Eye Exam: 11/07/2020 Current exercise: walking Current diet habits: in general, a "healthy" diet    Pertinent Labs: Lab Results  Component Value Date   CHOL 177 10/16/2020   HDL 46 10/16/2020   LDLCALC 102 (H) 10/16/2020   TRIG 164 (H) 10/16/2020   CHOLHDL 3.8 10/16/2020   Lab Results  Component Value Date   NA 138 10/16/2020   K 4.6 10/16/2020   CREATININE 1.13 10/16/2020   GFRNONAA 61 03/25/2020   GFRAA 71 03/25/2020   GLUCOSE 122 (H) 10/16/2020     --------------------------------------------------------------------------------------------------- He reports tiredness and fatigue, although it's a little better since the weather has cooled down. He does have OSA managed by pulmonary, but no longer has machine apparently because he wasn't using it consistently.    He also states he has been constipated, feels it's due to medication. Not taking any OTC medications or fiber  supplements.   Medications: Outpatient Medications Prior to Visit  Medication Sig   amiodarone (PACERONE) 200 MG tablet Take 2 tablets (400 mg total) by mouth 2 (two) times daily for 5 days, THEN 2 tablets (400 mg total) daily for 5 days, THEN 1 tablet (200 mg total) daily.   apixaban (ELIQUIS) 5 MG TABS tablet Take 1 tablet (5 mg total) by mouth 2 (two) times daily.   dapagliflozin propanediol (FARXIGA) 10 MG TABS tablet Take 1 tablet (10 mg total) by mouth daily before breakfast.   glipiZIDE (GLUCOTROL XL) 10 MG 24 hr tablet TAKE 1 TABLET BY MOUTH ONCE DAILY.   glucose blood (ONETOUCH ULTRA) test strip Use to check sugar daily for type 2 diabetes E11.9   HYDROmorphone (DILAUDID) 4 MG tablet Take 4 mg by mouth in the morning, at noon, in the evening, and at bedtime. Must be name brand   lisinopril (ZESTRIL) 10 MG tablet TAKE 1 TABLET BY MOUTH ONCE DAILY.   metFORMIN (GLUCOPHAGE) 1000 MG tablet TAKE 1 TABLET BY MOUTH TWICE DAILY   metoprolol succinate (TOPROL-XL) 100 MG 24 hr tablet Take 1 tablet (100 mg total) by mouth daily. Take with or immediately following a meal.   OXYCONTIN 20 MG 12 hr tablet Take 20 mg by mouth in the morning and at bedtime. Take 20 mg by mouth 12 noon and 2400   OXYCONTIN 30 MG 12 hr tablet Take 30 mg by mouth  in the morning and at bedtime. Take 30 mg by mouth at 0500 and 1730   pioglitazone (ACTOS) 45 MG tablet TAKE 1 TABLET BY MOUTH ONCE DAILY.   potassium chloride SA (KLOR-CON) 20 MEQ tablet Take 1 tablet (20 mEq total) by mouth daily.   pregabalin (LYRICA) 100 MG capsule Take 100 mg by mouth 2 (two) times daily.   simvastatin (ZOCOR) 40 MG tablet TAKE 1 TABLET BY MOUTH ONCE DAILY.   torsemide (DEMADEX) 20 MG tablet Take 20 mg by mouth 2 (two) times daily.   vitamin B-12 (CYANOCOBALAMIN) 1000 MCG tablet Take 1,000 mcg by mouth daily.   zolpidem (AMBIEN CR) 12.5 MG CR tablet Take 12.5 mg by mouth at bedtime.    NARCAN 4 MG/0.1ML LIQD nasal spray kit Place 0.4 mg  into the nose as needed (accidental overdose). (Patient not taking: Reported on 02/18/2021)   No facility-administered medications prior to visit.    Review of Systems  Constitutional: Negative.   Respiratory:  Positive for shortness of breath. Negative for apnea, cough, choking, chest tightness, wheezing and stridor.   Cardiovascular:  Positive for palpitations. Negative for chest pain and leg swelling.  Gastrointestinal:  Positive for constipation. Negative for abdominal distention, abdominal pain, anal bleeding, blood in stool, diarrhea, nausea, rectal pain and vomiting.  Endocrine: Negative for cold intolerance.  Musculoskeletal:  Positive for back pain.  Neurological:  Negative for dizziness, light-headedness and headaches.      Objective    BP 139/88 (BP Location: Left Arm, Patient Position: Sitting, Cuff Size: Large)   Pulse (!) 125   Wt (!) 370 lb (167.8 kg)   SpO2 95%   BMI 50.18 kg/m    Physical Exam  General appearance: Obese male, cooperative and in no acute distress Head: Normocephalic, without obvious abnormality, atraumatic Respiratory: Respirations even and unlabored, normal respiratory rate Extremities: All extremities are intact.  Skin: Skin color, texture, turgor normal. No rashes seen  Psych: Appropriate mood and affect. Neurologic: Mental status: Alert, oriented to person, place, and time, thought content appropriate.   Results for orders placed or performed in visit on 02/18/21  POCT glycosylated hemoglobin (Hb A1C)  Result Value Ref Range   Hemoglobin A1C 6.6 (A) 4.0 - 5.6 %   Est. average glucose Bld gHb Est-mCnc 143     Assessment & Plan     1. Type 2 diabetes mellitus with diabetic neuropathy, with long-term current use of insulin (HCC) Well controlled.  Continue current medications.    2. Constipation, unspecified constipation type Multifactorial, likely exacerbated by chronic opioid use. Recommend OTC fiber supplement and Senakot. Consider  prescription for OID if not improved at follow up .   3. Other fatigue Multifactorial, but improving with cooler weather.   4. Primary hypertension Well controlled.  Continue current medications.     OC-Lyte for home stool collection sent home with patient today.   Future Appointments  Date Time Provider [place]  05/20/2021 10:20 AM Birdie Sons, MD [facility]         The entirety of the information documented in the History of Present Illness, Review of Systems and Physical Exam were personally obtained by me. Portions of this information were initially documented by the CMA and reviewed by me for thoroughness and accuracy.     Lelon Huh, MD  [facility] [phone] (phone) [phone] (fax)  [place]

## 2021-02-18 NOTE — Patient Instructions (Addendum)
Please review the attached list of medications and notify my office if there are any errors.   Please bring all of your medications to every appointment so we can make sure that our medication list is the same as yours.   Start taking Metamucil every day to keep bowel movement regular. You can also take OTC Senokot once or twice a day as needed for constipation.   You will be due for colon screening in October. Please collect a stool sample In October and bring it to he office for testing

## 2021-02-24 ENCOUNTER — Other Ambulatory Visit: Payer: Self-pay | Admitting: Family Medicine

## 2021-02-25 ENCOUNTER — Other Ambulatory Visit: Payer: Self-pay

## 2021-02-25 ENCOUNTER — Telehealth: Payer: Self-pay | Admitting: Cardiology

## 2021-02-25 MED ORDER — POTASSIUM CHLORIDE CRYS ER 20 MEQ PO TBCR: 20.0000 meq | EXTENDED_RELEASE_TABLET | Freq: Every day | ORAL | 0 refills | Status: DC

## 2021-02-25 NOTE — Telephone Encounter (Signed)
-----   Message from Andi Devon sent at 02/25/2021 10:37 AM EDT ----- Regarding: appt Please schedule 6 month F/U appointment. Thank you!

## 2021-02-25 NOTE — Telephone Encounter (Signed)
Patient states he will call back to schedule

## 2021-03-03 ENCOUNTER — Ambulatory Visit (HOSPITAL_BASED_OUTPATIENT_CLINIC_OR_DEPARTMENT_OTHER): Payer: Medicare Other

## 2021-03-03 ENCOUNTER — Other Ambulatory Visit: Payer: Self-pay

## 2021-03-03 ENCOUNTER — Encounter (HOSPITAL_COMMUNITY)
Admission: RE | Disposition: A | Discharge status: Home / Self Care | Payer: Self-pay | Source: Ambulatory Visit | Attending: Cardiology

## 2021-03-03 ENCOUNTER — Ambulatory Visit (HOSPITAL_COMMUNITY): Payer: Medicare Other | Admitting: Anesthesiology

## 2021-03-03 ENCOUNTER — Ambulatory Visit (HOSPITAL_COMMUNITY)
Admission: RE | Admit: 2021-03-03 | Discharge: 2021-03-03 | Disposition: A | Discharge status: Home / Self Care | Payer: Medicare Other | Source: Ambulatory Visit | Attending: Cardiology | Admitting: Cardiology

## 2021-03-03 ENCOUNTER — Encounter (HOSPITAL_COMMUNITY): Payer: Self-pay | Admitting: Cardiology

## 2021-03-03 DIAGNOSIS — I10 Essential (primary) hypertension: Secondary | ICD-10-CM | POA: Diagnosis not present

## 2021-03-03 DIAGNOSIS — I4891 Unspecified atrial fibrillation: Secondary | ICD-10-CM

## 2021-03-03 DIAGNOSIS — Z7901 Long term (current) use of anticoagulants: Secondary | ICD-10-CM | POA: Diagnosis not present

## 2021-03-03 DIAGNOSIS — I081 Rheumatic disorders of both mitral and tricuspid valves: Secondary | ICD-10-CM | POA: Insufficient documentation

## 2021-03-03 DIAGNOSIS — I428 Other cardiomyopathies: Secondary | ICD-10-CM | POA: Insufficient documentation

## 2021-03-03 DIAGNOSIS — Z79899 Other long term (current) drug therapy: Secondary | ICD-10-CM | POA: Diagnosis not present

## 2021-03-03 DIAGNOSIS — I4819 Other persistent atrial fibrillation: Secondary | ICD-10-CM | POA: Insufficient documentation

## 2021-03-03 DIAGNOSIS — Z6841 Body Mass Index (BMI) 40.0 and over, adult: Secondary | ICD-10-CM | POA: Diagnosis not present

## 2021-03-03 DIAGNOSIS — I340 Nonrheumatic mitral (valve) insufficiency: Secondary | ICD-10-CM | POA: Diagnosis not present

## 2021-03-03 DIAGNOSIS — G4733 Obstructive sleep apnea (adult) (pediatric): Secondary | ICD-10-CM | POA: Diagnosis not present

## 2021-03-03 DIAGNOSIS — Z7984 Long term (current) use of oral hypoglycemic drugs: Secondary | ICD-10-CM | POA: Diagnosis not present

## 2021-03-03 DIAGNOSIS — I361 Nonrheumatic tricuspid (valve) insufficiency: Secondary | ICD-10-CM | POA: Diagnosis not present

## 2021-03-03 DIAGNOSIS — Z9989 Dependence on other enabling machines and devices: Secondary | ICD-10-CM | POA: Diagnosis not present

## 2021-03-03 DIAGNOSIS — E7800 Pure hypercholesterolemia, unspecified: Secondary | ICD-10-CM | POA: Diagnosis not present

## 2021-03-03 HISTORY — PX: TEE WITHOUT CARDIOVERSION: SHX5443

## 2021-03-03 HISTORY — PX: CARDIOVERSION: EP1203

## 2021-03-03 LAB — BASIC METABOLIC PANEL
Anion gap: 9 (ref 5–15)
BUN: 17 mg/dL (ref 8–23)
CO2: 28 mmol/L (ref 22–32)
Calcium: 8.8 mg/dL — ABNORMAL LOW (ref 8.9–10.3)
Chloride: 100 mmol/L (ref 98–111)
Creatinine, Ser: 1.31 mg/dL — ABNORMAL HIGH (ref 0.61–1.24)
GFR, Estimated: >60 mL/min (ref >60)
Glucose, Bld: 147 mg/dL — ABNORMAL HIGH (ref 70–99)
Potassium: 4.1 mmol/L (ref 3.5–5.1)
Sodium: 137 mmol/L (ref 135–145)

## 2021-03-03 LAB — CBC
HCT: 37.8 % — ABNORMAL LOW (ref 39.0–52.0)
Hemoglobin: 11.9 g/dL — ABNORMAL LOW (ref 13.0–17.0)
MCH: 28.7 pg (ref 26.0–34.0)
MCHC: 31.5 g/dL (ref 30.0–36.0)
MCV: 91.1 fL (ref 80.0–100.0)
Platelets: 171 10*3/uL (ref 150–400)
RBC: 4.15 MIL/uL — ABNORMAL LOW (ref 4.22–5.81)
RDW: 16.2 % — ABNORMAL HIGH (ref 11.5–15.5)
WBC: 7.8 10*3/uL (ref 4.0–10.5)
nRBC: 0 % (ref 0.0–0.2)

## 2021-03-03 LAB — GLUCOSE, CAPILLARY
Glucose-Capillary: 142 mg/dL — ABNORMAL HIGH (ref 70–99)
Glucose-Capillary: 113 mg/dL — ABNORMAL HIGH (ref 70–99)

## 2021-03-03 SURGERY — CARDIOVERSION (CATH LAB)
Anesthesia: General

## 2021-03-03 MED ORDER — SUCCINYLCHOLINE CHLORIDE 200 MG/10ML IV SOSY
PREFILLED_SYRINGE | INTRAVENOUS | Status: DC | PRN
  Administered 2021-03-03: 200 mg via INTRAVENOUS

## 2021-03-03 MED ORDER — SODIUM CHLORIDE 0.9% FLUSH: 3.0000 mL | INTRAVENOUS | Status: DC | PRN

## 2021-03-03 MED ORDER — LIDOCAINE HCL (CARDIAC) PF 100 MG/5ML IV SOSY
PREFILLED_SYRINGE | INTRAVENOUS | Status: DC | PRN
  Administered 2021-03-03: 100 mg via INTRAVENOUS

## 2021-03-03 MED ORDER — SODIUM CHLORIDE 0.9 % IV SOLN: INTRAVENOUS | Status: DC

## 2021-03-03 MED ORDER — PROPOFOL 10 MG/ML IV BOLUS
INTRAVENOUS | Status: DC | PRN
Start: 2021-03-03 — End: 2021-03-03
  Administered 2021-03-03: 70 mg via INTRAVENOUS

## 2021-03-03 MED ORDER — ONDANSETRON HCL 4 MG/2ML IJ SOLN: 4.0000 mg | Freq: Four times a day (QID) | INTRAMUSCULAR | Status: DC | PRN

## 2021-03-03 MED ORDER — SODIUM CHLORIDE 0.9 % IV SOLN: 250.0000 mL | INTRAVENOUS | Status: DC | PRN

## 2021-03-03 MED ORDER — SODIUM CHLORIDE 0.9% FLUSH: 3.0000 mL | Freq: Two times a day (BID) | INTRAVENOUS | Status: DC

## 2021-03-03 MED ORDER — ACETAMINOPHEN 325 MG PO TABS
650.0000 mg | ORAL_TABLET | ORAL | Status: DC | PRN
  Filled 2021-03-03: qty 2

## 2021-03-03 MED ORDER — FENTANYL CITRATE (PF) 100 MCG/2ML IJ SOLN
INTRAMUSCULAR | Status: DC | PRN
  Administered 2021-03-03: 100 ug via INTRAVENOUS

## 2021-03-03 MED ORDER — ETOMIDATE 2 MG/ML IV SOLN
INTRAVENOUS | Status: DC | PRN
  Administered 2021-03-03: 10 mg via INTRAVENOUS

## 2021-03-03 MED ORDER — PHENYLEPHRINE 40 MCG/ML (10ML) SYRINGE FOR IV PUSH (FOR BLOOD PRESSURE SUPPORT)
PREFILLED_SYRINGE | INTRAVENOUS | Status: DC | PRN
  Administered 2021-03-03: 80 ug via INTRAVENOUS

## 2021-03-03 SURGICAL SUPPLY — 1 items: PAD PRO RADIOLUCENT 2001M-C (PAD) ×2 IMPLANT

## 2021-03-03 NOTE — H&P (Signed)
Electrophysiology Office Follow up Visit Note:     Date:  01/01/2021    ID:  Taylor [name], DOB 1955/09/12, MRN [id number]   PCP:  Birdie Sons, MD    [facility] Cardiologist:  Kate Sable, MD  [facility] Electrophysiologist:  Vickie Epley, MD      Interval History:     Taylor [name] is a 65 y.o. male who presents for a follow up visit. They were last seen in clinic September 04, 2020 for persistent atrial fibrillation.  He had recently failed cardioversion at the time of our last appointment.  During her last appointment I recommended loading with Tikosyn to try to achieve rhythm control given his left ventricular dysfunction but he was hesitant to start a medication and he wanted to work on exercise and wearing his CPAP first.  He presents today for follow-up.  He last saw Dr. Garen Lah on September 12, 2020. Since his appointment with Dr. Garen Lah he did have an echocardiogram.  This showed a left ventricular function of 35 to 40%   He has previously tried to have a heart MRI of the brain but was unable to tolerate because of claustrophobia.  The patient tells me that he struggles with taking medications and is worried about adherence to the twice daily schedule of dofetilide.  He is very hesitant about admission to the hospital for drug loading.  He continues to be fatigued frequently.  He is unsure whether or not this related to his atrial fibrillation or his other medications (including pain medications).  He is alarmed during the discussion of his ejection fraction being about half that of a normal heart.           Past Medical History:  Diagnosis Date   Arthritis     Broken leg 02/07/2019   Depression     Diabetes mellitus without complication ([place])     Hypertension             Past Surgical History:  Procedure Laterality Date   BACK SURGERY       CARDIOVERSION N/A 08/14/2020    Procedure: CARDIOVERSION;  Surgeon: Kate Sable, MD;   Location: [facility] ORS;  Service: Cardiovascular;  Laterality: N/A;   LEFT HEART CATH AND CORONARY ANGIOGRAPHY Left 04/05/2020    Procedure: LEFT HEART CATH AND CORONARY ANGIOGRAPHY;  Surgeon: Wellington Hampshire, MD;  Location: [facility];  Service: Cardiovascular;  Laterality: Left;      Current Medications: Active Medications      Current Meds  Medication Sig   amiodarone (PACERONE) 200 MG tablet Take 2 tablets (400 mg total) by mouth 2 (two) times daily for 5 days, THEN 2 tablets (400 mg total) daily for 5 days, THEN 1 tablet (200 mg total) daily.   apixaban (ELIQUIS) 5 MG TABS tablet Take 1 tablet (5 mg total) by mouth 2 (two) times daily.   dapagliflozin propanediol (FARXIGA) 10 MG TABS tablet Take 1 tablet (10 mg total) by mouth daily before breakfast.   glipiZIDE (GLUCOTROL XL) 10 MG 24 hr tablet TAKE 1 TABLET BY MOUTH ONCE DAILY.   glucose blood (ONETOUCH ULTRA) test strip Use to check sugar daily for type 2 diabetes E11.9   HYDROmorphone (DILAUDID) 4 MG tablet Take 4 mg by mouth in the morning, at noon, in the evening, and at bedtime. Must be name brand   lisinopril (ZESTRIL) 10 MG tablet TAKE 1 TABLET BY MOUTH ONCE DAILY.   metFORMIN (GLUCOPHAGE) 1000  MG tablet TAKE 1 TABLET BY MOUTH TWICE DAILY   metoprolol succinate (TOPROL-XL) 100 MG 24 hr tablet Take 1 tablet (100 mg total) by mouth daily. Take with or immediately following a meal.   NARCAN 4 MG/0.1ML LIQD nasal spray kit Place 0.4 mg into the nose as needed (accidental overdose).   OXYCONTIN 20 MG 12 hr tablet Take 20 mg by mouth in the morning and at bedtime. Take 20 mg by mouth 12 noon and 2400   OXYCONTIN 30 MG 12 hr tablet Take 30 mg by mouth in the morning and at bedtime. Take 30 mg by mouth at 0500 and 1730   pioglitazone (ACTOS) 45 MG tablet TAKE 1 TABLET BY MOUTH ONCE DAILY.   potassium chloride SA (KLOR-CON) 20 MEQ tablet Take 1 tablet (20 mEq total) by mouth daily.   pregabalin (LYRICA) 100 MG capsule Take 100 mg  by mouth 2 (two) times daily.   simvastatin (ZOCOR) 40 MG tablet TAKE 1 TABLET BY MOUTH ONCE DAILY.   torsemide (DEMADEX) 20 MG tablet Take 20 mg by mouth 2 (two) times daily.   vitamin B-12 (CYANOCOBALAMIN) 1000 MCG tablet Take 1,000 mcg by mouth daily.   zolpidem (AMBIEN CR) 12.5 MG CR tablet Take 12.5 mg by mouth at bedtime.         Allergies:   Other    Social History         Socioeconomic History   Marital status: Single      Spouse name: Not on file   Number of children: 1   Years of education: Not on file   Highest education level: Some college, no degree  Occupational History   Occupation: disability/retired  Tobacco Use   Smoking status: Never   Smokeless tobacco: Never  Vaping Use   Vaping Use: Never used  Substance and Sexual Activity   Alcohol use: No      Alcohol/week: 0.0 standard drinks   Drug use: No   Sexual activity: Not on file  Other Topics Concern   Not on file  Social History Narrative    Lives with Mother [name].     Social Determinants of Health    Financial Resource Strain: Not on file  Food Insecurity: Not on file  Transportation Needs: Not on file  Physical Activity: Not on file  Stress: Not on file  Social Connections: Not on file      Family History: The patient's family history includes Diabetes in his mother and sister.   ROS:   Please see the history of present illness.    All other systems reviewed and are negative.   EKGs/Labs/Other Studies Reviewed:     The following studies were reviewed today:   December 16, 2020 echo personally reviewed Left ventricular function difficult to assess, at least moderately decreased   EKG:  The ekg ordered today demonstrates atrial fibrillation with rapid ventricular rates with a ventricular rate of 103 bpm.  Frequent PVCs.  PVCs are monomorphic and have an inferior axis.   Recent Labs: 10/16/2020: ALT 10; BUN 16; Creatinine, Ser 1.13; Hemoglobin 13.3; Platelets 206; Potassium 4.6; Sodium 138   Recent Lipid Panel Labs (Brief)          Component Value Date/Time    CHOL 177 10/16/2020 1046    TRIG 164 (H) 10/16/2020 1046    HDL 46 10/16/2020 1046    CHOLHDL 3.8 10/16/2020 1046    LDLCALC 102 (H) 10/16/2020 1046  Physical Exam:     VS:  BP 100/70 (BP Location: Left Arm, Patient Position: Sitting, Cuff Size: Large)   Pulse (!) 103   Ht 6' (1.829 m)   Wt (!) 368 lb (166.9 kg)   BMI 49.91 kg/m         Wt Readings from Last 3 Encounters:  01/01/21 (!) 368 lb (166.9 kg)  10/16/20 (!) 370 lb 6.4 oz (168 kg)  09/12/20 (!) 360 lb (163.3 kg)      GEN:  Well nourished, well developed in no acute distress.  Morbidly obese HEENT: Normal NECK: No JVD; No carotid bruits LYMPHATICS: No lymphadenopathy CARDIAC: Irregularly irregular, tachycardic, no murmurs, rubs, gallops RESPIRATORY:  Clear to auscultation without rales, wheezing or rhonchi  ABDOMEN: Soft, non-tender, non-distended MUSCULOSKELETAL:  No edema; No deformity  SKIN: Warm and dry NEUROLOGIC:  Alert and oriented x 3 PSYCHIATRIC:  Normal affect    ASSESSMENT:     1. Persistent atrial fibrillation ([place])   2. NICM (nonischemic cardiomyopathy) ([place])   3. Morbid obesity with BMI of 40.0-44.9, adult ([place])   4. Primary hypertension     PLAN:     In order of problems listed above:     1. Persistent atrial fibrillation (HCC) Remains in atrial fibrillation.  Has previously failed cardioversion.  He is not a candidate for ablation given his BMI.  I discussed management options for him including continued conservative management with rate control versus antiarrhythmic drug initiation with cardioversion (dofetilide versus amiodarone).  I do not think he is a good candidate for dofetilide given concerns about medication adherence.  I will plan to bring him to the lab in 6 to 8 weeks after starting amiodarone today.  I can use the larger defibrillator in the lab if necessary to attempt to restore normal rhythm.  We  will also plan for a TEE that day under general anesthesia in the lab on the table so that we can accurately assess his left ventricular function via transgastric views and exclude left atrial/left atrial appendage thrombus.   We will initiate amiodarone 400 mg by mouth twice daily for 5 days followed by 400 mg by mouth once daily for 5 days followed by 200 mg by mouth thereafter.   2. NICM (nonischemic cardiomyopathy) (HCC) NYHA class III.  Last EF was about 35% although views from the chest wall were difficult.  We will plan to reassess his left ventricular function in the EP lab as above.  A rhythm control strategy is indicated given his known LV dysfunction and persistent atrial fibrillation.   ICD discussions deferred until we can maintain normal rhythm and reassess LV function.   3. Morbid obesity with BMI of 40.0-44.9, adult ([place]) Contributing to #1 and #2.  Weight loss encouraged.   4. Primary hypertension At goal today.  Continue medical therapy including Toprol-XL and torsemide.           Total time spent with patient today 50 minutes. This includes reviewing records, evaluating the patient and coordinating care.    Medication Adjustments/Labs and Tests Ordered: Current medicines are reviewed at length with the patient today.  Concerns regarding medicines are outlined above.     Orders Placed This Encounter  Procedures   EKG 12-Lead        Meds ordered this encounter  Medications   amiodarone (PACERONE) 200 MG tablet      Sig: Take 2 tablets (400 mg total) by mouth 2 (two) times daily for 5 days,  THEN 2 tablets (400 mg total) daily for 5 days, THEN 1 tablet (200 mg total) daily.      Dispense:  110 tablet      Refill:  3        Signed, Lars Mage, MD, [facility], [facility] 01/01/2021 5:05 PM    Electrophysiology [facility]      -----------------------------------------  I have seen, examined the patient, and reviewed the above assessment  and plan.    Plan for TEE DCCV today.   Vickie Epley, MD 03/03/2021 10:00 AM

## 2021-03-03 NOTE — CV Procedure (Signed)
    TRANSESOPHAGEAL ECHOCARDIOGRAM   NAME:  Taylor [name]   MRN: [id number] DOB:  10-Jul-1955   ADMIT DATE: 03/03/2021  INDICATIONS: Atrial fibrillation  PROCEDURE:   Informed consent was obtained prior to the procedure. The risks, benefits and alternatives for the procedure were discussed and the patient comprehended these risks.  Risks include, but are not limited to, cough, sore throat, vomiting, nausea, somnolence, esophageal and stomach trauma or perforation, bleeding, low blood pressure, aspiration, pneumonia, infection, trauma to the teeth and death.    Patient received monitored anesthesia care under the supervision of Dr. Stephannie Peters. Anesthesia totals reported under cardioversion report by Dr. Lalla Brothers  The transesophageal probe was inserted in the esophagus and stomach without difficulty and multiple views were obtained.    COMPLICATIONS:    There were no immediate complications.  FINDINGS:  LEFT VENTRICLE: EF = 35-40%, with global hypokinesis  RIGHT VENTRICLE: Normal size, mildly reduced function.   LEFT ATRIUM: No thrombus/mass.  LEFT ATRIAL APPENDAGE: No thrombus/mass.   RIGHT ATRIUM: No thrombus/mass. Prominent eustachian valve  AORTIC VALVE:  Trileaflet. No significant regurgitation. No vegetation.  MITRAL VALVE:    Normal structure. Mild regurgitation. No vegetation.  TRICUSPID VALVE: Normal structure. Moderate regurgitation. No vegetation.  PULMONIC VALVE: Grossly normal structure. Trivial regurgitation. No apparent vegetation.  INTERATRIAL SEPTUM: No PFO or ASD seen by color Doppler.  PERICARDIUM: No effusion noted.  DESCENDING AORTA: No significant plaque seen   CONCLUSION: No evidence of thrombus. Globally reduced EF of 35-40%. Proceed to cardioversion by Dr. Lalla Brothers.   Jodelle Red, MD, PhD [facility]  [address] [place], [place] [address] [phone]   11:04 AM

## 2021-03-03 NOTE — Progress Notes (Signed)
  Echocardiogram Echocardiogram Transesophageal has been performed.  Augustine Radar 03/03/2021, 11:16 AM

## 2021-03-03 NOTE — Anesthesia Procedure Notes (Signed)
Procedure Name: Intubation Date/Time: 03/03/2021 10:43 AM Performed by: Trinna Post., CRNA Pre-anesthesia Checklist: Patient identified, Emergency Drugs available, Suction available, Patient being monitored and Timeout performed Patient Re-evaluated:Patient Re-evaluated prior to induction Oxygen Delivery Method: Circle system utilized Preoxygenation: Pre-oxygenation with 100% oxygen Induction Type: IV induction and Rapid sequence Laryngoscope Size: Mac and 4 Grade View: Grade I Tube type: Oral Tube size: 7.0 mm Number of attempts: 1 Airway Equipment and Method: Stylet Placement Confirmation: ETT inserted through vocal cords under direct vision, positive ETCO2 and breath sounds checked- equal and bilateral Secured at: 23 cm Tube secured with: Tape Dental Injury: Teeth and Oropharynx as per pre-operative assessment

## 2021-03-03 NOTE — Transfer of Care (Signed)
Immediate Anesthesia Transfer of Care Note  Patient: Taylor [name]  Procedure(s) Performed: CARDIOVERSION (CATH LAB) TRANSESOPHAGEAL ECHOCARDIOGRAM (TEE)  Patient Location: PACU and Cath Lab  Anesthesia Type:General  Level of Consciousness: awake, alert  and oriented  Airway & Oxygen Therapy: Patient Spontanous Breathing and Patient connected to face mask oxygen  Post-op Assessment: Report given to RN and Post -op Vital signs reviewed and stable  Post vital signs: Reviewed and stable  Last Vitals:  Vitals Value Taken Time  BP 120/57 03/03/21 1120  Temp    Pulse 70 03/03/21 1124  Resp 15 03/03/21 1124  SpO2 99 % 03/03/21 1124  Vitals shown include unvalidated device data.  Last Pain:  Vitals:   03/03/21 0917  TempSrc: Oral         Complications: No notable events documented.

## 2021-03-03 NOTE — Anesthesia Preprocedure Evaluation (Addendum)
Anesthesia Evaluation  Patient identified by MRN, date of birth, ID band Patient awake    Reviewed: Allergy & Precautions, NPO status , Patient's Chart, lab work & pertinent test results, reviewed documented beta blocker date and time   History of Anesthesia Complications Negative for: history of anesthetic complications  Airway Mallampati: III  TM Distance: >3 FB Neck ROM: Full    Dental  (+) Missing   Pulmonary sleep apnea ,    Pulmonary exam normal        Cardiovascular hypertension, Pt. on medications and Pt. on home beta blockers + CAD  Normal cardiovascular exam+ dysrhythmias (on Eliquis and amiodarone) Atrial Fibrillation   TTE 12/2020: EF 35-40%, moderate LAE, valves ok    Neuro/Psych Depression negative neurological ROS     GI/Hepatic negative GI ROS, Neg liver ROS,   Endo/Other  diabetes, Type 2, Oral Hypoglycemic AgentsMorbid obesity  Renal/GU negative Renal ROS  negative genitourinary   Musculoskeletal  (+) Arthritis ,   Abdominal   Peds  Hematology negative hematology ROS (+)   Anesthesia Other Findings Day of surgery medications reviewed with patient.  Reproductive/Obstetrics negative OB ROS                            Anesthesia Physical Anesthesia Plan  ASA: 3  Anesthesia Plan: General   Post-op Pain Management:    Induction: Intravenous  PONV Risk Score and Plan: 2 and Treatment may vary due to age or medical condition, Dexamethasone and Ondansetron  Airway Management Planned: Oral ETT  Additional Equipment: None  Intra-op Plan:   Post-operative Plan: Extubation in OR  Informed Consent: I have reviewed the patients History and Physical, chart, labs and discussed the procedure including the risks, benefits and alternatives for the proposed anesthesia with the patient or authorized representative who has indicated his/her understanding and acceptance.      Dental advisory given  Plan Discussed with: CRNA  Anesthesia Plan Comments:         Anesthesia Quick Evaluation

## 2021-03-05 NOTE — Anesthesia Postprocedure Evaluation (Signed)
Anesthesia Post Note  Patient: Taylor [name]  Procedure(s) Performed: CARDIOVERSION (CATH LAB) TRANSESOPHAGEAL ECHOCARDIOGRAM (TEE)     Patient location during evaluation: PACU Anesthesia Type: General Level of consciousness: awake and alert and oriented Pain management: pain level controlled Vital Signs Assessment: post-procedure vital signs reviewed and stable Respiratory status: spontaneous breathing, nonlabored ventilation and respiratory function stable Cardiovascular status: blood pressure returned to baseline Postop Assessment: no apparent nausea or vomiting Anesthetic complications: no   No notable events documented.  Last Vitals:  Vitals:   03/03/21 1303 03/03/21 1318  BP: (!) 119/51 (!) 117/58  Pulse: 65 (!) 59  Resp: 14 12  Temp:    SpO2: 96% 98%    Last Pain:  Vitals:   03/03/21 1215  TempSrc:   PainSc: 0-No pain                 Shanda Howells

## 2021-03-14 ENCOUNTER — Other Ambulatory Visit: Payer: Self-pay | Admitting: Family Medicine

## 2021-03-18 ENCOUNTER — Other Ambulatory Visit: Payer: Self-pay | Admitting: *Deleted

## 2021-03-18 MED ORDER — METOPROLOL SUCCINATE ER 100 MG PO TB24: 100.0000 mg | ORAL_TABLET | Freq: Every day | ORAL | 0 refills | Status: DC

## 2021-03-18 NOTE — Telephone Encounter (Signed)
LVM to schedule appt

## 2021-03-25 ENCOUNTER — Telehealth: Payer: Self-pay

## 2021-03-25 NOTE — Telephone Encounter (Signed)
Is it okay to leave pt samples?  Thanks,   -Vernona Rieger

## 2021-03-25 NOTE — Telephone Encounter (Signed)
He can have a month's supply if we have any. thanks

## 2021-03-25 NOTE — Telephone Encounter (Signed)
Two weeks worth of samples are at the front desk.  (That is all we have right now)  Left message advising pt.   Thanks,   -Vernona Rieger

## 2021-03-25 NOTE — Telephone Encounter (Signed)
Copied from CRM [id number]. Topic: General - Other >> Mar 25, 2021 11:07 AM Marylen Ponto wrote: Reason for CRM: Pt stated Dr. Sherrie Mustache normally provides him with samples of the dapagliflozin propanediol (FARXIGA) 10 MG TABS tablet. Pt requests call back to advise when samples are ready for pick up. Cb# [phone]

## 2021-03-27 ENCOUNTER — Telehealth: Payer: Self-pay | Admitting: Cardiology

## 2021-03-27 NOTE — Telephone Encounter (Signed)
Patient calling states needing financial assistant with prescription refill of Eliquis

## 2021-04-01 NOTE — Telephone Encounter (Addendum)
Samples and PAF paperwork have been left in the bin in the front office for patient.  Samples of Eliquis 5 MG were gathered to give the patient, quantity 3 boxes, Lot Number [id number] exp 9/24

## 2021-04-01 NOTE — Telephone Encounter (Signed)
Spoke with patient and he stated that he ran out of his Eliquis yesterday. Today was his first day without a dose. He is coming to the office today before 5pm to fill out the PAF, and pick up samples to get him by until we can get him approved.   Will route to anticoag for approval of samples.  Patient is taking Eliquis 5 MG twice daily.

## 2021-04-01 NOTE — Telephone Encounter (Signed)
Pt qualifies for 5mg  eliquis bid samples routing back to rn

## 2021-04-02 ENCOUNTER — Ambulatory Visit: Payer: Medicare Other | Admitting: Cardiology

## 2021-04-02 ENCOUNTER — Encounter: Payer: Self-pay | Admitting: Cardiology

## 2021-04-02 ENCOUNTER — Other Ambulatory Visit: Payer: Self-pay

## 2021-04-02 VITALS — BP 134/84 | HR 95 | Ht 72.0 in | Wt 374.0 lb

## 2021-04-02 DIAGNOSIS — I4819 Other persistent atrial fibrillation: Secondary | ICD-10-CM | POA: Diagnosis not present

## 2021-04-02 DIAGNOSIS — I10 Essential (primary) hypertension: Secondary | ICD-10-CM | POA: Diagnosis not present

## 2021-04-02 DIAGNOSIS — Z79899 Other long term (current) drug therapy: Secondary | ICD-10-CM

## 2021-04-02 DIAGNOSIS — I5020 Unspecified systolic (congestive) heart failure: Secondary | ICD-10-CM | POA: Diagnosis not present

## 2021-04-02 DIAGNOSIS — Z6841 Body Mass Index (BMI) 40.0 and over, adult: Secondary | ICD-10-CM

## 2021-04-02 NOTE — Telephone Encounter (Signed)
Patient did not come in to pick up the Eliquis and PAF forms yesterday, I called and left him a VM requesting a call back.

## 2021-04-02 NOTE — Progress Notes (Signed)
Electrophysiology Office Follow up Visit Note:    Date:  04/03/2021   ID:  Taylor [name], DOB 11/11/1955, MRN [id number]  PCP:  Birdie Sons, MD  [facility] Cardiologist:  Kate Sable, MD  [facility] Electrophysiologist:  Vickie Epley, MD    Interval History:    Taylor [name] is a 65 y.o. male who presents for a follow up visit. They were last seen in clinic January 01, 2021 for his persistent atrial fibrillation.  We started amiodarone and planned cardioversion which was performed March 03, 2021.  He was successfully converted to sinus rhythm with a single 360 J synchronized shock.  We have deferred ICD discussions due to difficult to control atrial fibrillation. Today he is back in atrial fibrillation.  He feels tired.  He is also having trouble paying for his Eliquis.  No bleeding issues when he was taking it.     Past Medical History:  Diagnosis Date   Arthritis    Broken leg 02/07/2019   Depression    Diabetes mellitus without complication ([place])    Hypertension     Past Surgical History:  Procedure Laterality Date   BACK SURGERY     CARDIOVERSION N/A 08/14/2020   Procedure: CARDIOVERSION;  Surgeon: Kate Sable, MD;  Location: [facility] ORS;  Service: Cardiovascular;  Laterality: N/A;   CARDIOVERSION N/A 03/03/2021   Procedure: CARDIOVERSION (CATH LAB);  Surgeon: Vickie Epley, MD;  Location: [place] CV [facility];  Service: Cardiovascular;  Laterality: N/A;   LEFT HEART CATH AND CORONARY ANGIOGRAPHY Left 04/05/2020   Procedure: LEFT HEART CATH AND CORONARY ANGIOGRAPHY;  Surgeon: Wellington Hampshire, MD;  Location: [place] CV LAB;  Service: Cardiovascular;  Laterality: Left;   TEE WITHOUT CARDIOVERSION N/A 03/03/2021   Procedure: TRANSESOPHAGEAL ECHOCARDIOGRAM (TEE);  Surgeon: Vickie Epley, MD;  Location: [place] CV LAB;  Service: Cardiovascular;  Laterality: N/A;    Current Medications: Current Meds  Medication Sig    apixaban (ELIQUIS) 5 MG TABS tablet Take 1 tablet (5 mg total) by mouth 2 (two) times daily.   dapagliflozin propanediol (FARXIGA) 10 MG TABS tablet Take 1 tablet (10 mg total) by mouth daily before breakfast.   glipiZIDE (GLUCOTROL XL) 10 MG 24 hr tablet TAKE 1 TABLET BY MOUTH ONCE DAILY.   glucose blood (ONETOUCH ULTRA) test strip Use to check sugar daily for type 2 diabetes E11.9   HYDROmorphone (DILAUDID) 4 MG tablet Take 4 mg by mouth in the morning, at noon, in the evening, and at bedtime. Must be name brand   lisinopril (ZESTRIL) 10 MG tablet TAKE 1 TABLET BY MOUTH ONCE DAILY.   metFORMIN (GLUCOPHAGE) 1000 MG tablet TAKE 1 TABLET BY MOUTH TWICE DAILY   metoprolol succinate (TOPROL-XL) 100 MG 24 hr tablet Take 1 tablet (100 mg total) by mouth daily. Take with or immediately following a meal.   NARCAN 4 MG/0.1ML LIQD nasal spray kit Place 0.4 mg into the nose as needed (accidental overdose).   OXYCONTIN 20 MG 12 hr tablet Take 20 mg by mouth in the morning and at bedtime. Take 20 mg by mouth 12 noon and 2400   OXYCONTIN 30 MG 12 hr tablet Take 30 mg by mouth in the morning and at bedtime. Take 30 mg by mouth at 0500 and 1730   pioglitazone (ACTOS) 45 MG tablet TAKE 1 TABLET BY MOUTH ONCE DAILY.   potassium chloride SA (KLOR-CON) 20 MEQ tablet Take 1 tablet (20 mEq total) by mouth  daily. PLEASE CALL TO SCHEDULE OFFICE VISIT FOR FURTHER REFILLS. THANK YOU!   pregabalin (LYRICA) 100 MG capsule Take 100 mg by mouth 3 (three) times daily.   simvastatin (ZOCOR) 40 MG tablet TAKE 1 TABLET BY MOUTH ONCE DAILY.   torsemide (DEMADEX) 20 MG tablet Take 20 mg by mouth 2 (two) times daily.   vitamin B-12 (CYANOCOBALAMIN) 1000 MCG tablet Take 1,000 mcg by mouth daily.   zolpidem (AMBIEN CR) 12.5 MG CR tablet Take 12.5 mg by mouth at bedtime.    [DISCONTINUED] amiodarone (PACERONE) 200 MG tablet Take 2 tablets (400 mg total) by mouth 2 (two) times daily for 5 days, THEN 2 tablets (400 mg total) daily for 5  days, THEN 1 tablet (200 mg total) daily. (Patient taking differently: 1 tablet (200 mg total) daily.)     Allergies:   Other   Social History   Socioeconomic History   Marital status: Single    Spouse name: Not on file   Number of children: 1   Years of education: Not on file   Highest education level: Some college, no degree  Occupational History   Occupation: disability/retired  Tobacco Use   Smoking status: Never   Smokeless tobacco: Never  Vaping Use   Vaping Use: Never used  Substance and Sexual Activity   Alcohol use: No    Alcohol/week: 0.0 standard drinks   Drug use: No   Sexual activity: Not on file  Other Topics Concern   Not on file  Social History Narrative   Lives with Mother Taylor [name].    Social Determinants of Health   Financial Resource Strain: Not on file  Food Insecurity: Not on file  Transportation Needs: Not on file  Physical Activity: Not on file  Stress: Not on file  Social Connections: Not on file     Family History: The patient's family history includes Diabetes in his mother and sister.  ROS:   Please see the history of present illness.    All other systems reviewed and are negative.  EKGs/Labs/Other Studies Reviewed:    The following studies were reviewed today:   EKG:  The ekg ordered today demonstrates atrial fibrillation with PVCs.  Ventricular rate of 95 bpm.  Recent Labs: 10/16/2020: ALT 10 03/03/2021: BUN 17; Creatinine, Ser 1.31; Hemoglobin 11.9; Platelets 171; Potassium 4.1; Sodium 137  Recent Lipid Panel    Component Value Date/Time   CHOL 177 10/16/2020 1046   TRIG 164 (H) 10/16/2020 1046   HDL 46 10/16/2020 1046   CHOLHDL 3.8 10/16/2020 1046   LDLCALC 102 (H) 10/16/2020 1046    Physical Exam:    VS:  BP 134/84 (BP Location: Left Arm, Patient Position: Sitting, Cuff Size: Large)   Pulse 95   Ht 6' (1.829 m)   Wt (!) 374 lb (169.6 kg)   SpO2 91%   BMI 50.72 kg/m     Wt Readings from Last 3 Encounters:   04/02/21 (!) 374 lb (169.6 kg)  03/03/21 (!) 368 lb (166.9 kg)  02/18/21 (!) 370 lb (167.8 kg)     GEN:  Well nourished, well developed in no acute distress.  Morbidly obese HEENT: Normal NECK: No JVD; No carotid bruits LYMPHATICS: No lymphadenopathy CARDIAC: Irregularly irregular, no murmurs, rubs, gallops RESPIRATORY:  Clear to auscultation without rales, wheezing or rhonchi  ABDOMEN: Soft, non-tender, non-distended MUSCULOSKELETAL:  No edema; No deformity  SKIN: Warm and dry NEUROLOGIC:  Alert and oriented x 3 PSYCHIATRIC:  Normal affect  ASSESSMENT:    1. HFrEF (heart failure with reduced ejection fraction) (HCC)   2. Persistent atrial fibrillation ([place])   3. Morbid obesity with BMI of 40.0-44.9, adult ([place])   4. Primary hypertension   5. Encounter for long-term (current) use of high-risk medication    PLAN:    In order of problems listed above:  1. HFrEF (heart failure with reduced ejection fraction) (HCC) NYHA class II-III.  Continue Farxiga, Toprol, Demadex.  Rhythm control is indicated as below.  Weight loss is encouraged.  2. Persistent atrial fibrillation (HCC) Symptomatic.  Rhythm control is indicated.  On Eliquis for stroke prophylaxis but is having trouble affording the medication.  I discussed this at length with the patient.  I offered to transition to Coumadin but we will first try to get him some medication assistance from the pharmaceutical company.  If this is not available, we will transition to Coumadin.  At this point, I would consider him to be in permanent atrial fibrillation.  I think we can stop the amiodarone to avoid possible off target effects given he is unable to maintain normal rhythm despite amiodarone.  3. Morbid obesity with BMI of 40.0-44.9, adult ([place]) Likely contributing to multiple medical problems.  We discussed how his obesity is a contraindication for catheter ablation of his atrial fibrillation.  He will work on weight  loss.  4. Primary hypertension At goal today.  Continue medical therapy.  5. Encounter for long-term (current) use of high-risk medication Stopping amiodarone today.  Follow-up 6 months with APP     Total time spent with patient today 45 minutes. This includes reviewing records, evaluating the patient and coordinating care.   Medication Adjustments/Labs and Tests Ordered: Current medicines are reviewed at length with the patient today.  Concerns regarding medicines are outlined above.  Orders Placed This Encounter  Procedures   EKG 12-Lead   No orders of the defined types were placed in this encounter.    Signed, Lars Mage, MD, [facility], [facility] 04/03/2021 12:10 AM    Electrophysiology [facility]

## 2021-04-02 NOTE — Patient Instructions (Signed)
Medication Instructions:  Your physician has recommended you make the following change in your medication:  STOP Amiodarone  *If you need a refill on your cardiac medications before your next appointment, please call your pharmacy*   Lab Work: None ordered   Testing/Procedures: None ordered   Follow-Up: At [facility], you and your health needs are our priority.  As part of our continuing mission to provide you with exceptional heart care, we have created designated Provider Care Teams.  These Care Teams include your primary Cardiologist (physician) and Advanced Practice Providers (APPs -  Physician Assistants and Nurse Practitioners) who all work together to provide you with the care you need, when you need it.  We recommend signing up for the patient portal called "MyChart".  Sign up information is provided on this After Visit Summary.  MyChart is used to connect with patients for Virtual Visits (Telemedicine).  Patients are able to view lab/test results, encounter notes, upcoming appointments, etc.  Non-urgent messages can be sent to your provider as well.   To learn more about what you can do with MyChart, go to [email or web address].    Your next appointment:   6 month(s)  The format for your next appointment:   In Person  Provider:   You will see one of the following Advanced Practice Providers on your designated Care Team:   Nicolasa Ducking, NP Eula Listen, PA-C Marisue Ivan, PA-C Cadence Fransico Michael, [place]    Thank you for choosing [facility]!!    Other Instructions  [employer] (Eliquis):   [phone]

## 2021-04-04 NOTE — Telephone Encounter (Signed)
Called patient and he stated that he was given Eliquis and PAF to fill out on Wednesday 04/02/21 while he was here in the office seeing Dr. Lalla Brothers. Closing encounter.

## 2021-04-08 ENCOUNTER — Telehealth: Payer: Self-pay | Admitting: Family Medicine

## 2021-04-08 DIAGNOSIS — E1140 Type 2 diabetes mellitus with diabetic neuropathy, unspecified: Secondary | ICD-10-CM

## 2021-04-08 NOTE — Telephone Encounter (Signed)
Medication Refill - Medication:  pagliflozin propanediol (FARXIGA) 10 MG TABS tablet  Has the patient contacted their pharmacy? No.  *Pt states they are samples he normally gets from the office*  Preferred Pharmacy (with phone number or street name):  [facility] [place] - [place], [place] - [address] Phone:  [phone]  Fax:  [phone]      Has the patient been seen for an appointment in the last year OR does the patient have an upcoming appointment? Yes.    Agent: Please be advised that RX refills may take up to 3 business days. We ask that you follow-up with your pharmacy.

## 2021-04-08 NOTE — Telephone Encounter (Signed)
He can have a month's samples if we have them 

## 2021-04-08 NOTE — Telephone Encounter (Signed)
Patient called and advised a Rx was sent to his pharmacy on 02/06/21. He says that it will cost him $150 that he doesn't have, so if there are samples available he would appreciate it. He says he has a few left from the samples given before. I advised I will send this to the provider and someone will call back to let him know what's available, patient verbalized understanding.

## 2021-04-10 NOTE — Telephone Encounter (Signed)
Left message advising pt the samples are ready.   Thanks,   -Vernona Rieger

## 2021-04-10 NOTE — Telephone Encounter (Signed)
Pt called back to check on samples for Farxiga/ pt called during lunch/ advised pt that sample can be given if they are available /please call pt and advise if office has sample he can pick up

## 2021-04-11 ENCOUNTER — Telehealth: Payer: Self-pay | Admitting: Cardiology

## 2021-04-11 NOTE — Telephone Encounter (Signed)
Cleaning out sample bin from our front desk.  Returning Eliquis samples left for the patient to stock. See 03/27/21 phone note.  Eliquis 5 mg Lot: [id number] Exp: 02/2023 # 3 bottles

## 2021-04-14 ENCOUNTER — Other Ambulatory Visit: Payer: Self-pay | Admitting: Family Medicine

## 2021-04-15 ENCOUNTER — Ambulatory Visit: Payer: Medicare Other | Admitting: Cardiology

## 2021-04-24 ENCOUNTER — Ambulatory Visit: Payer: Medicare Other | Admitting: Cardiology

## 2021-04-24 ENCOUNTER — Other Ambulatory Visit: Payer: Self-pay | Admitting: *Deleted

## 2021-04-24 MED ORDER — POTASSIUM CHLORIDE CRYS ER 20 MEQ PO TBCR: 20.0000 meq | EXTENDED_RELEASE_TABLET | Freq: Every day | ORAL | 1 refills | Status: DC

## 2021-04-24 MED ORDER — TORSEMIDE 20 MG PO TABS: 20.0000 mg | ORAL_TABLET | Freq: Two times a day (BID) | ORAL | 1 refills | Status: DC

## 2021-04-28 ENCOUNTER — Telehealth: Payer: Self-pay

## 2021-04-28 NOTE — Telephone Encounter (Signed)
Patient calling.  Patient is completely out of Eliquis.   Patient would like to know if we have any samples to provide.

## 2021-04-29 ENCOUNTER — Telehealth: Payer: Self-pay

## 2021-04-29 MED ORDER — APIXABAN 5 MG PO TABS: 5.0000 mg | ORAL_TABLET | Freq: Two times a day (BID) | ORAL | 6 refills | Status: DC

## 2021-04-29 NOTE — Telephone Encounter (Signed)
Pt last saw Dr Lalla Brothers 04/02/21, last labs 03/03/21 Creat 1.31, age 65, weight 169.6kg, based on specified criteria pt is on appropriate dosage of Eliquis 5mg  BID for afib.  Will send in refill to pt's pharmacy as requested.  Will route message back to [place] office to advise pt on sample availability.

## 2021-04-29 NOTE — Telephone Encounter (Signed)
Patient also aware that prescription was sent to pharmacy today.

## 2021-04-29 NOTE — Telephone Encounter (Signed)
Medication Samples have been provided to the patient.  Drug name: Eliquis       Strength: 5MG         Qty: 1 box  LOT:  Exp.Date: 02/2023  03/2023 11:10 AM 04/29/2021   Informed patient that sample box is ready for pick up from front office.

## 2021-04-29 NOTE — Telephone Encounter (Signed)
Patient calling stating that he is completely out of Eliquis.   Patient takes Eliquis 5MG .   Patient requesting samples and a refill.   Please call if we can provide samples.   Refill can be sent to [facility].

## 2021-05-09 ENCOUNTER — Telehealth: Payer: Self-pay

## 2021-05-09 NOTE — Telephone Encounter (Signed)
He can have one months samples if we have them.  

## 2021-05-09 NOTE — Telephone Encounter (Signed)
Left message advising pt his samples are at the front desk.   Thanks,   -Vernona Rieger

## 2021-05-09 NOTE — Telephone Encounter (Signed)
Copied from CRM [id number]. Topic: General - Other >> May 09, 2021  9:59 AM Jaquita Rector A wrote: Reason for CRM: Patient called in to request samples of dapagliflozin propanediol (FARXIGA) 10 MG TABS tablet from Dr Sullivan Lone. Please call with an answer Ph# [phone]

## 2021-05-12 ENCOUNTER — Other Ambulatory Visit: Payer: Self-pay

## 2021-05-12 ENCOUNTER — Telehealth: Payer: Self-pay | Admitting: Cardiology

## 2021-05-12 ENCOUNTER — Ambulatory Visit: Payer: Medicare Other | Admitting: Cardiology

## 2021-05-12 ENCOUNTER — Encounter: Payer: Self-pay | Admitting: Cardiology

## 2021-05-12 VITALS — BP 136/74 | HR 97 | Ht 72.0 in | Wt 387.4 lb

## 2021-05-12 DIAGNOSIS — I2510 Atherosclerotic heart disease of native coronary artery without angina pectoris: Secondary | ICD-10-CM | POA: Diagnosis not present

## 2021-05-12 DIAGNOSIS — I428 Other cardiomyopathies: Secondary | ICD-10-CM | POA: Diagnosis not present

## 2021-05-12 DIAGNOSIS — I4821 Permanent atrial fibrillation: Secondary | ICD-10-CM | POA: Diagnosis not present

## 2021-05-12 DIAGNOSIS — I10 Essential (primary) hypertension: Secondary | ICD-10-CM | POA: Diagnosis not present

## 2021-05-12 MED ORDER — TORSEMIDE 20 MG PO TABS: ORAL_TABLET | ORAL | 3 refills | Status: DC

## 2021-05-12 NOTE — Progress Notes (Signed)
Cardiology Office Note:    Date:  05/12/2021   ID:  Taylor [name], DOB [birth year], MRN [id number]  PCP:  Birdie Sons, MD  Cardiologist:  Kate Sable, MD  Electrophysiologist:  Vickie Epley, MD   Referring MD: Birdie Sons, MD   No chief complaint on file.   History of Present Illness:    Taylor [name] is a 65 y.o. male with a hx of hypertension, CAD (60% mLAD, 30% D2), diabetes, persistent atrial fibrillation s/p failed DCCV, NICM, EF 30 to 35%, OSA who presents for follow-up.    He is being seen for atrial fibrillation and nonischemic cardiomyopathy.  Was seen by EP for A. fib management.  Placed on amiodarone, repeat cardioversion was unsuccessful.  He states gaining weight, lower extremity edema.  Currently takes torsemide 20 mg twice daily.  Denies palpitations or dizziness.  Has shortness of breath with exertion.     Prior notes Echocardiogram 12/2020 EF 35 to 40% Echocardiogram 12/2019 showed moderate to severely reduced EF, EF 30 to 35%. Left heart cath 03/2020 mid LAD 60% stenosis, second diagonal 30% stenosis. Failed DC cardioversion (for afib) attempt on 08/06/2020   Past Medical History:  Diagnosis Date   Arthritis    Broken leg 02/07/2019   Depression    Diabetes mellitus without complication ([place])    Hypertension     Past Surgical History:  Procedure Laterality Date   BACK SURGERY     CARDIOVERSION N/A 08/14/2020   Procedure: CARDIOVERSION;  Surgeon: Kate Sable, MD;  Location: [facility] ORS;  Service: Cardiovascular;  Laterality: N/A;   CARDIOVERSION N/A 03/03/2021   Procedure: CARDIOVERSION (CATH LAB);  Surgeon: Vickie Epley, MD;  Location: [place] [facility];  Service: Cardiovascular;  Laterality: N/A;   LEFT HEART CATH AND CORONARY ANGIOGRAPHY Left 04/05/2020   Procedure: LEFT HEART CATH AND CORONARY ANGIOGRAPHY;  Surgeon: Wellington Hampshire, MD;  Location: [place] [facility];  Service: Cardiovascular;  Laterality: Left;    TEE WITHOUT CARDIOVERSION N/A 03/03/2021   Procedure: TRANSESOPHAGEAL ECHOCARDIOGRAM (TEE);  Surgeon: Vickie Epley, MD;  Location: [place] [facility];  Service: Cardiovascular;  Laterality: N/A;    Current Medications: Current Meds  Medication Sig   apixaban (ELIQUIS) 5 MG TABS tablet Take 1 tablet (5 mg total) by mouth 2 (two) times daily.   dapagliflozin propanediol (FARXIGA) 10 MG TABS tablet Take 1 tablet (10 mg total) by mouth daily before breakfast.   glipiZIDE (GLUCOTROL XL) 10 MG 24 hr tablet TAKE 1 TABLET BY MOUTH ONCE DAILY.   glucose blood (ONETOUCH ULTRA) test strip Use to check sugar daily for type 2 diabetes E11.9   HYDROmorphone (DILAUDID) 4 MG tablet Take 4 mg by mouth in the morning, at noon, in the evening, and at bedtime. Must be name brand   lisinopril (ZESTRIL) 10 MG tablet TAKE 1 TABLET BY MOUTH ONCE DAILY.   metFORMIN (GLUCOPHAGE) 1000 MG tablet TAKE 1 TABLET BY MOUTH TWICE DAILY   metoprolol succinate (TOPROL-XL) 100 MG 24 hr tablet Take 1 tablet (100 mg total) by mouth daily. Take with or immediately following a meal.   NARCAN 4 MG/0.1ML LIQD nasal spray kit Place 0.4 mg into the nose as needed (accidental overdose).   OXYCONTIN 20 MG 12 hr tablet Take 20 mg by mouth in the morning and at bedtime. Take 20 mg by mouth 12 noon and 2400   OXYCONTIN 30 MG 12 hr tablet Take 30 mg by mouth in the  morning and at bedtime. Take 30 mg by mouth at 0500 and 1730   pioglitazone (ACTOS) 45 MG tablet TAKE 1 TABLET BY MOUTH ONCE DAILY.   potassium chloride SA (KLOR-CON) 20 MEQ tablet Take 1 tablet (20 mEq total) by mouth daily.   pregabalin (LYRICA) 100 MG capsule Take 100 mg by mouth 3 (three) times daily.   simvastatin (ZOCOR) 40 MG tablet TAKE 1 TABLET BY MOUTH ONCE DAILY.   vitamin B-12 (CYANOCOBALAMIN) 1000 MCG tablet Take 1,000 mcg by mouth daily.   zolpidem (AMBIEN CR) 12.5 MG CR tablet Take 12.5 mg by mouth at bedtime.    [DISCONTINUED] torsemide (DEMADEX) 20 MG tablet  Take 1 tablet (20 mg total) by mouth 2 (two) times daily.     Allergies:   Other   Social History   Socioeconomic History   Marital status: Single    Spouse name: Not on file   Number of children: 1   Years of education: Not on file   Highest education level: Some college, no degree  Occupational History   Occupation: disability/retired  Tobacco Use   Smoking status: Never   Smokeless tobacco: Never  Vaping Use   Vaping Use: Never used  Substance and Sexual Activity   Alcohol use: No    Alcohol/week: 0.0 standard drinks   Drug use: No   Sexual activity: Not on file  Other Topics Concern   Not on file  Social History Narrative   Lives with Mother [name].    Social Determinants of Health   Financial Resource Strain: Not on file  Food Insecurity: Not on file  Transportation Needs: Not on file  Physical Activity: Not on file  Stress: Not on file  Social Connections: Not on file     Family History: The patient's family history includes Diabetes in his mother and sister.  ROS:   Please see the history of present illness.     All other systems reviewed and are negative.  EKGs/Labs/Other Studies Reviewed:    The following studies were reviewed today:   EKG:  EKG is ordered today.  EKG shows atrial fibrillation, PVC, heart rate 97  Recent Labs: 10/16/2020: ALT 10 03/03/2021: BUN 17; Creatinine, Ser 1.31; Hemoglobin 11.9; Platelets 171; Potassium 4.1; Sodium 137  Recent Lipid Panel    Component Value Date/Time   CHOL 177 10/16/2020 1046   TRIG 164 (H) 10/16/2020 1046   HDL 46 10/16/2020 1046   CHOLHDL 3.8 10/16/2020 1046   LDLCALC 102 (H) 10/16/2020 1046    Physical Exam:    VS:  BP 136/74   Pulse 97   Ht 6' (1.829 m)   Wt (!) 387 lb 6.4 oz (175.7 kg)   SpO2 92%   BMI 52.54 kg/m     Wt Readings from Last 3 Encounters:  05/12/21 (!) 387 lb 6.4 oz (175.7 kg)  04/02/21 (!) 374 lb (169.6 kg)  03/03/21 (!) 368 lb (166.9 kg)     GEN:  Well nourished,  well developed in no acute distress HEENT: Normal NECK: No JVD; No carotid bruits LYMPHATICS: No lymphadenopathy CARDIAC: Irregular irregular, non tachycardic, no murmurs, rubs, gallops RESPIRATORY:  Clear to auscultation without rales, wheezing or rhonchi  ABDOMEN: Soft, non-tender, non-distended MUSCULOSKELETAL:  trace-1+ edema; No deformity  SKIN: Warm and dry NEUROLOGIC:  Alert and oriented x 3 PSYCHIATRIC:  Normal affect   ASSESSMENT:    1. Permanent atrial fibrillation ([place])   2. NICM (nonischemic cardiomyopathy) ([place])   3. Coronary artery  disease involving native coronary artery of native heart without angina pectoris   4. Primary hypertension     PLAN:    In order of problems listed above:  persistent atrial fibrillation, heart rate controlled. CHA2DS2-VASc score at 3 (chf,htn. DM).  Continue toprol xl 168m qd, Eliquis 54mtwice daily.  Failed amiodarone, Failed DC cardioversion.  Continue rate control strategy.  Permanent A. fib.  Appreciate input from EP. NICM, current EF slightly improved to 35-40%.. Marland KitchenNYHA class II-III symptoms. Cont Toprol-XL 10041mlisinopril 10 mg daily, increase torsemide 40 mg a.m. and 20 mg p.m.   Farxiga 10 mg daily.  Arrhythmia/A. fib likely contributing to cardiomyopathy.  Discussed starting Entresto, patient worried about cost. Nonobstructive CAD, 60% mid LAD, 30% diagonal.  Continue Eliquis, statin. Patient with history of hypertension.  BP controlled.  Toprol-XL, lisinopril, torsemide.  Follow-up in 6 months  Total encounter time 40  minutes  Greater than 50% was spent in counseling and coordination of care with the patient   This note was generated in part or whole with voice recognition software. Voice recognition is usually quite accurate but there are transcription errors that can and very often do occur. I apologize for any typographical errors that were not detected and corrected.  Medication Adjustments/Labs and Tests  Ordered: Current medicines are reviewed at length with the patient today.  Concerns regarding medicines are outlined above.  Orders Placed This Encounter  Procedures   Basic metabolic panel   EKG 12-50-DTOI Meds ordered this encounter  Medications   torsemide (DEMADEX) 20 MG tablet    Sig: Take 40 MG (2 tabs) every AM, and Take 20 MG (1 tab) every PM.    Dispense:  90 tablet    Refill:  3     Patient Instructions  Medication Instructions:   Your physician has recommended you make the following change in your medication:    INCREASE your Torsemide to 40 MG every AM and 20 MG every PM.  *If you need a refill on your cardiac medications before your next appointment, please call your pharmacy*   Lab Work:  Your physician recommends that you return for lab work (BMP) in: 1 week    Please return to our office on_____________________at______________am/pm    Testing/Procedures: None ordered   Follow-Up: [facility] and your health needs are our priority.  As part of our continuing mission to provide you with exceptional heart care, we have created designated Provider Care Teams.  These Care Teams include your primary Cardiologist (physician) and Advanced Practice Providers (APPs -  Physician Assistants and Nurse Practitioners) who all work together to provide you with the care you need, when you need it.  We recommend signing up for the patient portal called "MyChart".  Sign up information is provided on this After Visit Summary.  MyChart is used to connect with patients for Virtual Visits (Telemedicine).  Patients are able to view lab/test results, encounter notes, upcoming appointments, etc.  Non-urgent messages can be sent to your provider as well.   To learn more about what you can do with MyChart, go to [email or web address]  Your next appointment:   2 month(s)  The format for your next appointment:   In Person  Provider:   You may see BriKate SableD or one of the following Advanced Practice Providers on your designated Care Team:   ChrMurray HodgkinsP RyaChristell FaithA-C Cadence FurKathlen ModyA-[place] Other Instructions  Signed, Kate Sable, MD  05/12/2021 12:40 PM    [place]

## 2021-05-12 NOTE — Telephone Encounter (Signed)
Patient wanting labs bmp at 12/13 pcp visit with other blood work.    Patient aware if not drawn there can call and be worked in same day at [employer].   Patient agreeable .

## 2021-05-12 NOTE — Telephone Encounter (Signed)
Noted  

## 2021-05-12 NOTE — Patient Instructions (Signed)
Medication Instructions:   Your physician has recommended you make the following change in your medication:    INCREASE your Torsemide to 40 MG every AM and 20 MG every PM.  *If you need a refill on your cardiac medications before your next appointment, please call your pharmacy*   Lab Work:  Your physician recommends that you return for lab work (BMP) in: 1 week    Please return to our office on_____________________at______________am/pm    Testing/Procedures: None ordered   Follow-Up: At [facility], you and your health needs are our priority.  As part of our continuing mission to provide you with exceptional heart care, we have created designated Provider Care Teams.  These Care Teams include your primary Cardiologist (physician) and Advanced Practice Providers (APPs -  Physician Assistants and Nurse Practitioners) who all work together to provide you with the care you need, when you need it.  We recommend signing up for the patient portal called "MyChart".  Sign up information is provided on this After Visit Summary.  MyChart is used to connect with patients for Virtual Visits (Telemedicine).  Patients are able to view lab/test results, encounter notes, upcoming appointments, etc.  Non-urgent messages can be sent to your provider as well.   To learn more about what you can do with MyChart, go to [email or web address].    Your next appointment:   2 month(s)  The format for your next appointment:   In Person  Provider:   You may see Debbe Odea, MD or one of the following Advanced Practice Providers on your designated Care Team:   Nicolasa Ducking, NP Eula Listen, PA-C Cadence Fransico Michael, [place]    Other Instructions

## 2021-05-19 NOTE — Progress Notes (Deleted)
Established patient visit   Patient: Taylor [name]   DOB: 1956/05/29   65 y.o. Male  MRN: [id number] Visit Date: 05/20/2021  Today's healthcare provider: Lelon Huh, MD   No chief complaint on file.  Subjective    HPI  Diabetes Mellitus Type II, follow-up  Lab Results  Component Value Date   HGBA1C 6.6 (A) 02/18/2021   HGBA1C 7.5 (H) 10/16/2020   HGBA1C 6.9 (A) 05/13/2020   Last seen for diabetes 3 months ago.  Management since then includes continuing the same treatment. He reports {excellent/good/fair/poor:19665} compliance with treatment. He {is/is not:21021397} having side effects. {document side effects if present:1}  Home blood sugar records: {diabetes glucometry results:16657}  Episodes of hypoglycemia? {Yes/No:20286} {enter details if yes:1}   Current insulin regiment: {***Type 'None' if not taking insulin                                                otherwise enter complete                                                 details of insulin regiment:1} Most Recent Eye Exam: ***  --------------------------------------------------------------------------------------------------- Hypertension, follow-up  BP Readings from Last 3 Encounters:  05/12/21 136/74  04/02/21 134/84  03/03/21 (!) 117/58   Wt Readings from Last 3 Encounters:  05/12/21 (!) 387 lb 6.4 oz (175.7 kg)  04/02/21 (!) 374 lb (169.6 kg)  03/03/21 (!) 368 lb (166.9 kg)     He was last seen for hypertension 3 months ago.  BP at that visit was 139/88. Management since that visit includes no changes. He reports {excellent/good/fair/poor:19665} compliance with treatment. He {is/is not:9024} having side effects. {document side effects if present:1} He {is/is not:9024} exercising. He {is/is not:9024} adherent to low salt diet.   Outside blood pressures are {enter patient reported home BP, or 'not being checked':1}.  He {does/does not:200015} smoke.  Use of agents associated with  hypertension: {bp agents assoc with hypertension:511::"none"}.   --------------------------------------------------------------------------------------------------- Lipid/Cholesterol, follow-up  Last Lipid Panel: Lab Results  Component Value Date   CHOL 177 10/16/2020   LDLCALC 102 (H) 10/16/2020   HDL 46 10/16/2020   TRIG 164 (H) 10/16/2020    He was last seen for this 7 months ago.  Management since that visit includes no changes.  He reports {excellent/good/fair/poor:19665} compliance with treatment. He {is/is not:9024} having side effects. {document side effects if present:1}  Symptoms: {Yes/No:20286} appetite changes {Yes/No:20286} foot ulcerations  {Yes/No:20286} chest pain {Yes/No:20286} chest pressure/discomfort  {Yes/No:20286} dyspnea {Yes/No:20286} orthopnea  {Yes/No:20286} fatigue {Yes/No:20286} lower extremity edema  {Yes/No:20286} palpitations {Yes/No:20286} paroxysmal nocturnal dyspnea  {Yes/No:20286} nausea {Yes/No:20286} numbness or tingling of extremity  {Yes/No:20286} polydipsia {Yes/No:20286} polyuria  {Yes/No:20286} speech difficulty {Yes/No:20286} syncope   He is following a {diet:21022986} diet. Current exercise: {exercise CXKGY:18563}  Last metabolic panel Lab Results  Component Value Date   GLUCOSE 147 (H) 03/03/2021   NA 137 03/03/2021   K 4.1 03/03/2021   BUN 17 03/03/2021   CREATININE 1.31 (H) 03/03/2021   EGFR 73 10/16/2020   GFRNONAA >60 03/03/2021   CALCIUM 8.8 (L) 03/03/2021   AST 14 10/16/2020   ALT 10 10/16/2020   The 10-year ASCVD risk  score (Arnett DK, et al., 2019) is: 26.3%  ---------------------------------------------------------------------------------------------------   {Link to patient history deactivated due to formatting error:1}  Medications: Outpatient Medications Prior to Visit  Medication Sig   apixaban (ELIQUIS) 5 MG TABS tablet Take 1 tablet (5 mg total) by mouth 2 (two) times daily.   dapagliflozin  propanediol (FARXIGA) 10 MG TABS tablet Take 1 tablet (10 mg total) by mouth daily before breakfast.   glipiZIDE (GLUCOTROL XL) 10 MG 24 hr tablet TAKE 1 TABLET BY MOUTH ONCE DAILY.   glucose blood (ONETOUCH ULTRA) test strip Use to check sugar daily for type 2 diabetes E11.9   HYDROmorphone (DILAUDID) 4 MG tablet Take 4 mg by mouth in the morning, at noon, in the evening, and at bedtime. Must be name brand   lisinopril (ZESTRIL) 10 MG tablet TAKE 1 TABLET BY MOUTH ONCE DAILY.   metFORMIN (GLUCOPHAGE) 1000 MG tablet TAKE 1 TABLET BY MOUTH TWICE DAILY   metoprolol succinate (TOPROL-XL) 100 MG 24 hr tablet Take 1 tablet (100 mg total) by mouth daily. Take with or immediately following a meal.   NARCAN 4 MG/0.1ML LIQD nasal spray kit Place 0.4 mg into the nose as needed (accidental overdose).   OXYCONTIN 20 MG 12 hr tablet Take 20 mg by mouth in the morning and at bedtime. Take 20 mg by mouth 12 noon and 2400   OXYCONTIN 30 MG 12 hr tablet Take 30 mg by mouth in the morning and at bedtime. Take 30 mg by mouth at 0500 and 1730   pioglitazone (ACTOS) 45 MG tablet TAKE 1 TABLET BY MOUTH ONCE DAILY.   potassium chloride SA (KLOR-CON) 20 MEQ tablet Take 1 tablet (20 mEq total) by mouth daily.   pregabalin (LYRICA) 100 MG capsule Take 100 mg by mouth 3 (three) times daily.   simvastatin (ZOCOR) 40 MG tablet TAKE 1 TABLET BY MOUTH ONCE DAILY.   torsemide (DEMADEX) 20 MG tablet Take 40 MG (2 tabs) every AM, and Take 20 MG (1 tab) every PM.   vitamin B-12 (CYANOCOBALAMIN) 1000 MCG tablet Take 1,000 mcg by mouth daily.   zolpidem (AMBIEN CR) 12.5 MG CR tablet Take 12.5 mg by mouth at bedtime.    No facility-administered medications prior to visit.    Review of Systems  {Labs  Heme  Chem  Endocrine  Serology  Results Review (optional):23779}   Objective    There were no vitals taken for this visit. {Show previous vital signs (optional):23777}  Physical Exam  ***  No results found for any  visits on 05/20/21.  Assessment & Plan     ***  No follow-ups on file.      {provider attestation***:1}   Lelon Huh, MD  [facility] [phone] (phone) [phone] (fax)  [place]

## 2021-05-20 ENCOUNTER — Ambulatory Visit: Payer: Medicare Other | Admitting: Family Medicine

## 2021-05-20 ENCOUNTER — Other Ambulatory Visit: Payer: Self-pay | Admitting: Cardiology

## 2021-05-20 DIAGNOSIS — I10 Essential (primary) hypertension: Secondary | ICD-10-CM

## 2021-06-10 ENCOUNTER — Other Ambulatory Visit: Payer: Self-pay | Admitting: Family Medicine

## 2021-06-10 ENCOUNTER — Telehealth: Payer: Self-pay | Admitting: Family Medicine

## 2021-06-10 DIAGNOSIS — E1140 Type 2 diabetes mellitus with diabetic neuropathy, unspecified: Secondary | ICD-10-CM

## 2021-06-10 NOTE — Telephone Encounter (Signed)
Pt wants to know if Dr Sherrie Mustache can give him more samples of the dapagliflozin propanediol (FARXIGA) 10 MG TABS tablet

## 2021-06-10 NOTE — Telephone Encounter (Signed)
We don't have any right now. He can have 4 bottles of the Jardiance 10mg  tablets. We hope to have some Farxiga 10mg  in the next couple of weeks.

## 2021-06-11 MED ORDER — DAPAGLIFLOZIN PROPANEDIOL 10 MG PO TABS: 10.0000 mg | ORAL_TABLET | Freq: Every day | ORAL | 0 refills | Status: DC

## 2021-06-11 NOTE — Telephone Encounter (Signed)
Tried calling patient. Left message to call back. Samples placed up front for pick up. OK for PEC to advise if patient returns call.

## 2021-06-11 NOTE — Telephone Encounter (Signed)
We have Taylor [name] now.   It is okay to leave him samples of Farxiga?  Thanks,  -Vernona Rieger

## 2021-06-11 NOTE — Telephone Encounter (Signed)
That's fine, he can have 3 boxes.

## 2021-06-25 ENCOUNTER — Other Ambulatory Visit: Payer: Self-pay | Admitting: Family Medicine

## 2021-06-25 DIAGNOSIS — E1165 Type 2 diabetes mellitus with hyperglycemia: Secondary | ICD-10-CM

## 2021-07-04 NOTE — Telephone Encounter (Signed)
Patient's mom came by the office today and picked up samples. Patient in our parking lot waiting in the car. I spoke with patient and he gave verbal ok for him mom to pick up samples.

## 2021-07-17 ENCOUNTER — Encounter: Payer: Self-pay | Admitting: Cardiology

## 2021-07-17 ENCOUNTER — Ambulatory Visit: Payer: Medicare Other | Admitting: Cardiology

## 2021-07-17 ENCOUNTER — Other Ambulatory Visit: Payer: Self-pay

## 2021-07-17 VITALS — BP 130/82 | HR 110 | Ht 73.0 in | Wt 385.0 lb

## 2021-07-17 DIAGNOSIS — I428 Other cardiomyopathies: Secondary | ICD-10-CM

## 2021-07-17 DIAGNOSIS — I4821 Permanent atrial fibrillation: Secondary | ICD-10-CM

## 2021-07-17 DIAGNOSIS — I10 Essential (primary) hypertension: Secondary | ICD-10-CM

## 2021-07-17 DIAGNOSIS — I2510 Atherosclerotic heart disease of native coronary artery without angina pectoris: Secondary | ICD-10-CM | POA: Diagnosis not present

## 2021-07-17 DIAGNOSIS — Z6841 Body Mass Index (BMI) 40.0 and over, adult: Secondary | ICD-10-CM

## 2021-07-17 MED ORDER — METOPROLOL SUCCINATE ER 200 MG PO TB24: 200.0000 mg | ORAL_TABLET | Freq: Every day | ORAL | 5 refills | Status: DC

## 2021-07-17 NOTE — Patient Instructions (Signed)
Medication Instructions:   Your physician has recommended you make the following change in your medication:   INCREASE your Metoprolol Succinate to 200 MG once a day.  *If you need a refill on your cardiac medications before your next appointment, please call your pharmacy*   Lab Work:  BMP to be drawn today in office   If you have labs (blood work) drawn today and your tests are completely normal, you will receive your results only by: [place] (if you have MyChart) OR A paper copy in the mail If you have any lab test that is abnormal or we need to change your treatment, we will call you to review the results.   Testing/Procedures:  None ordered   Follow-Up: At [facility], you and your health needs are our priority.  As part of our continuing mission to provide you with exceptional heart care, we have created designated Provider Care Teams.  These Care Teams include your primary Cardiologist (physician) and Advanced Practice Providers (APPs -  Physician Assistants and Nurse Practitioners) who all work together to provide you with the care you need, when you need it.  We recommend signing up for the patient portal called "MyChart".  Sign up information is provided on this After Visit Summary.  MyChart is used to connect with patients for Virtual Visits (Telemedicine).  Patients are able to view lab/test results, encounter notes, upcoming appointments, etc.  Non-urgent messages can be sent to your provider as well.   To learn more about what you can do with MyChart, go to [email or web address].    Your next appointment:   6 month(s)  The format for your next appointment:   In Person  Provider:   You may see Kate Sable, MD or one of the following Advanced Practice Providers on your designated Care Team:   Murray Hodgkins, NP Christell Faith, PA-C Cadence Kathlen Mody, [place]    Other Instructions

## 2021-07-17 NOTE — Progress Notes (Signed)
Cardiology Office Note:    Date:  07/17/2021   ID:  DENISE BRAMBLETT, DOB [birth year], MRN [id number]  PCP:  Birdie Sons, MD  Cardiologist:  Kate Sable, MD  Electrophysiologist:  Vickie Epley, MD   Referring MD: Birdie Sons, MD   Chief Complaint  Patient presents with   Other    2 month follow up -- Patient c.o SOB and swelling. Meds reviewed verbally with patient.      History of Present Illness:    MYKELL RAWL is a 66 y.o. male with a hx of hypertension, CAD (60% mLAD, 30% D2), diabetes, permanent atrial fibrillation s/p failed DCCV, NICM last EF 35-40%, OSA, morbid obesity who presents for follow-up.    He is being seen for atrial fibrillation and nonischemic cardiomyopathy.  Previously evaluated by electrophysiology, not a candidate for ablation due to morbid obesity.  Denies palpitations, on and off lower extremity edema.  Takes medications as prescribed.  Has no bleeding issues with Eliquis.  Currently takes Toprol-XL 100 daily, lisinopril 10.   Prior notes Echocardiogram 12/2020 EF 35 to 40% Echocardiogram 12/2019 showed moderate to severely reduced EF, EF 30 to 35%. Left heart cath 03/2020 mid LAD 60% stenosis, second diagonal 30% stenosis. Failed DC cardioversion (for afib) attempt on 08/06/2020   Past Medical History:  Diagnosis Date   Arthritis    Broken leg 02/07/2019   Depression    Diabetes mellitus without complication ([place])    Hypertension     Past Surgical History:  Procedure Laterality Date   BACK SURGERY     CARDIOVERSION N/A 08/14/2020   Procedure: CARDIOVERSION;  Surgeon: Kate Sable, MD;  Location: [facility] ORS;  Service: Cardiovascular;  Laterality: N/A;   CARDIOVERSION N/A 03/03/2021   Procedure: CARDIOVERSION (CATH LAB);  Surgeon: Vickie Epley, MD;  Location: [facility] CV LAB;  Service: Cardiovascular;  Laterality: N/A;   LEFT HEART CATH AND CORONARY ANGIOGRAPHY Left 04/05/2020   Procedure: LEFT HEART CATH AND  CORONARY ANGIOGRAPHY;  Surgeon: Wellington Hampshire, MD;  Location: [facility] CV LAB;  Service: Cardiovascular;  Laterality: Left;   TEE WITHOUT CARDIOVERSION N/A 03/03/2021   Procedure: TRANSESOPHAGEAL ECHOCARDIOGRAM (TEE);  Surgeon: Vickie Epley, MD;  Location: [place] [facility] CV LAB;  Service: Cardiovascular;  Laterality: N/A;    Current Medications: Current Meds  Medication Sig   apixaban (ELIQUIS) 5 MG TABS tablet Take 1 tablet (5 mg total) by mouth 2 (two) times daily.   dapagliflozin propanediol (FARXIGA) 10 MG TABS tablet Take 1 tablet (10 mg total) by mouth daily before breakfast.   glipiZIDE (GLUCOTROL XL) 10 MG 24 hr tablet TAKE 1 TABLET BY MOUTH ONCE DAILY.   glucose blood (ONETOUCH ULTRA) test strip Use to check sugar daily for type 2 diabetes E11.9   HYDROmorphone (DILAUDID) 4 MG tablet Take 4 mg by mouth in the morning, at noon, in the evening, and at bedtime. Must be name brand   lisinopril (ZESTRIL) 10 MG tablet TAKE 1 TABLET BY MOUTH ONCE DAILY.   metFORMIN (GLUCOPHAGE) 1000 MG tablet TAKE 1 TABLET BY MOUTH TWICE DAILY   metoprolol (TOPROL XL) 200 MG 24 hr tablet Take 1 tablet (200 mg total) by mouth daily.   NARCAN 4 MG/0.1ML LIQD nasal spray kit Place 0.4 mg into the nose as needed (accidental overdose).   OXYCONTIN 20 MG 12 hr tablet Take 20 mg by mouth in the morning and at bedtime. Take 20 mg by mouth 12 noon and  2400   OXYCONTIN 30 MG 12 hr tablet Take 30 mg by mouth in the morning and at bedtime. Take 30 mg by mouth at 0500 and 1730   pioglitazone (ACTOS) 45 MG tablet TAKE 1 TABLET BY MOUTH ONCE DAILY.   potassium chloride SA (KLOR-CON) 20 MEQ tablet Take 1 tablet (20 mEq total) by mouth daily.   pregabalin (LYRICA) 100 MG capsule Take 100 mg by mouth 3 (three) times daily.   simvastatin (ZOCOR) 40 MG tablet TAKE 1 TABLET BY MOUTH ONCE DAILY.   torsemide (DEMADEX) 20 MG tablet Take 40 MG (2 tabs) every AM, and Take 20 MG (1 tab) every PM.   vitamin B-12  (CYANOCOBALAMIN) 1000 MCG tablet Take 1,000 mcg by mouth daily.   zolpidem (AMBIEN CR) 12.5 MG CR tablet Take 12.5 mg by mouth at bedtime.    [DISCONTINUED] metoprolol succinate (TOPROL-XL) 100 MG 24 hr tablet TAKE 1 TABLET BY MOUTH ONCE DAILY. TAKE WITH OR IMMEDIATELY FOLLOWING A MEAL.     Allergies:   Other   Social History   Socioeconomic History   Marital status: Single    Spouse name: Not on file   Number of children: 1   Years of education: Not on file   Highest education level: Some college, no degree  Occupational History   Occupation: disability/retired  Tobacco Use   Smoking status: Never   Smokeless tobacco: Never  Vaping Use   Vaping Use: Never used  Substance and Sexual Activity   Alcohol use: No    Alcohol/week: 0.0 standard drinks   Drug use: No   Sexual activity: Not on file  Other Topics Concern   Not on file  Social History Narrative   Lives with Mother [name].    Social Determinants of Health   Financial Resource Strain: Not on file  Food Insecurity: Not on file  Transportation Needs: Not on file  Physical Activity: Not on file  Stress: Not on file  Social Connections: Not on file     Family History: The patient's family history includes Diabetes in his mother and sister.  ROS:   Please see the history of present illness.     All other systems reviewed and are negative.  EKGs/Labs/Other Studies Reviewed:    The following studies were reviewed today:   EKG:  EKG is ordered today.  EKG shows atrial fibrillation, heart rate 105.  Recent Labs: 10/16/2020: ALT 10 03/03/2021: BUN 17; Creatinine, Ser 1.31; Hemoglobin 11.9; Platelets 171; Potassium 4.1; Sodium 137  Recent Lipid Panel    Component Value Date/Time   CHOL 177 10/16/2020 1046   TRIG 164 (H) 10/16/2020 1046   HDL 46 10/16/2020 1046   CHOLHDL 3.8 10/16/2020 1046   LDLCALC 102 (H) 10/16/2020 1046    Physical Exam:    VS:  BP 130/82 (BP Location: Left Arm, Patient Position:  Sitting, Cuff Size: Large)    Pulse (!) 110    Ht 6' 1" (1.854 m)    Wt (!) 385 lb (174.6 kg)    SpO2 96%    BMI 50.79 kg/m     Wt Readings from Last 3 Encounters:  07/17/21 (!) 385 lb (174.6 kg)  05/12/21 (!) 387 lb 6.4 oz (175.7 kg)  04/02/21 (!) 374 lb (169.6 kg)     GEN:  Well nourished, well developed in no acute distress HEENT: Normal NECK: No JVD; No carotid bruits LYMPHATICS: No lymphadenopathy CARDIAC: Irregular irregular, non tachycardic, no murmurs, rubs, gallops RESPIRATORY:  Clear  to auscultation without rales, wheezing or rhonchi  ABDOMEN: Soft, non-tender, non-distended MUSCULOSKELETAL:  trace-1+ edema; No deformity  SKIN: Warm and dry NEUROLOGIC:  Alert and oriented x 3 PSYCHIATRIC:  Normal affect   ASSESSMENT:    1. Permanent atrial fibrillation ([place])   2. NICM (nonischemic cardiomyopathy) ([place])   3. Coronary artery disease involving native coronary artery of native heart without angina pectoris   4. Primary hypertension   5. BMI 50.0-59.9, adult (HCC)    PLAN:    In order of problems listed above:  Permanent atrial fibrillation, heart rate elevated at 105-110. CHA2DS2-VASc score at 3 (chf,htn. DM).  Increase Toprol-XL to 200 mg daily, Eliquis 40m twice daily.  Failed amiodarone, Failed DC cardioversion, morbid obesity not permitting RFA as an option.  Continue rate control strategy.  NICM, current EF slightly improved to 35-40%. NYHA class II-III symptoms.  Increase Toprol-XL to 2053mdaily.  Continue lisinopril 10 mg daily, torsemide 40 mg a.m. and 20 mg p.m. check BMP.  Farxiga 10 mg daily.  Arrhythmia/A. fib likely contributing to cardiomyopathy.  Not on Entresto due to cost issues Nonobstructive CAD, 60% mid LAD, 30% diagonal.  Continue Eliquis, statin. Patient with history of hypertension.  BP controlled.  Toprol-XL, lisinopril, torsemide. Morbid obesity, low-calorie diet, weight loss advised .  Follow-up in 6 months  Total encounter time 40  minutes   Greater than 50% was spent in counseling and coordination of care with the patient   This note was generated in part or whole with voice recognition software. Voice recognition is usually quite accurate but there are transcription errors that can and very often do occur. I apologize for any typographical errors that were not detected and corrected.  Medication Adjustments/Labs and Tests Ordered: Current medicines are reviewed at length with the patient today.  Concerns regarding medicines are outlined above.  Orders Placed This Encounter  Procedures   Basic metabolic panel   EKG 1230-[facility]  Meds ordered this encounter  Medications   metoprolol (TOPROL XL) 200 MG 24 hr tablet    Sig: Take 1 tablet (200 mg total) by mouth daily.    Dispense:  30 tablet    Refill:  5     Patient Instructions  Medication Instructions:   Your physician has recommended you make the following change in your medication:   INCREASE your Metoprolol Succinate to 200 MG once a day.  *If you need a refill on your cardiac medications before your next appointment, please call your pharmacy*   Lab Work:  BMP to be drawn today in office   If you have labs (blood work) drawn today and your tests are completely normal, you will receive your results only by: MyThompsonif you have MyChart) OR A paper copy in the mail If you have any lab test that is abnormal or we need to change your treatment, we will call you to review the results.   Testing/Procedures:  None ordered   Follow-Up: [facility] are our [facility].  As part of our continuing mission to provide you with exceptional heart care, we have created designated Provider Care Teams.  These Care Teams include your primary Cardiologist (physician) and Advanced Practice Providers (APPs -  Physician Assistants and Nurse Practitioners) who all work together to provide you with the care you need, when you need it.  We  recommend signing up for the patient portal called "MyChart".  Sign up information is provided  on this After Visit Summary.  MyChart is used to connect with patients for Virtual Visits (Telemedicine).  Patients are able to view lab/test results, encounter notes, upcoming appointments, etc.  Non-urgent messages can be sent to your provider as well.   To learn more about what you can do with MyChart, go to [email or web address].    Your next appointment:   6 month(s)  The format for your next appointment:   In Person  Provider:   You may see Kate Sable, MD or one of the following Advanced Practice Providers on your designated Care Team:   Murray Hodgkins, NP Christell Faith, PA-C Cadence Kathlen Mody, [place]    Other Instructions     Signed, Kate Sable, MD  07/17/2021 12:07 PM    [place]

## 2021-07-18 LAB — BASIC METABOLIC PANEL
BUN/Creatinine Ratio: 11 (ref 10–24)
BUN: 11 mg/dL (ref 8–27)
CO2: 23 mmol/L (ref 20–29)
Calcium: 9.1 mg/dL (ref 8.6–10.2)
Chloride: 101 mmol/L (ref 96–106)
Creatinine, Ser: 1.04 mg/dL (ref 0.76–1.27)
Glucose: 137 mg/dL — ABNORMAL HIGH (ref 70–99)
Potassium: 4.3 mmol/L (ref 3.5–5.2)
Sodium: 139 mmol/L (ref 134–144)
eGFR: 80 mL/min/{1.73_m2} (ref >59)

## 2021-07-23 ENCOUNTER — Telehealth: Payer: Self-pay

## 2021-07-23 DIAGNOSIS — E1140 Type 2 diabetes mellitus with diabetic neuropathy, unspecified: Secondary | ICD-10-CM

## 2021-07-23 DIAGNOSIS — Z794 Long term (current) use of insulin: Secondary | ICD-10-CM

## 2021-07-23 NOTE — Telephone Encounter (Signed)
Copied from CRM [id number]. Topic: General - Other >> Jul 23, 2021 11:48 AM Pawlus, Maxine Glenn A wrote: Pt called wanting to know if Dr Sherrie Mustache has any samples of Marcelline Deist he could pick up, please advise.

## 2021-07-24 MED ORDER — DAPAGLIFLOZIN PROPANEDIOL 10 MG PO TABS: 10.0000 mg | ORAL_TABLET | Freq: Every day | ORAL | 0 refills | Status: DC

## 2021-07-24 NOTE — Telephone Encounter (Signed)
Sounds good, he can have 3 sample boxes. Thanks!

## 2021-07-24 NOTE — Telephone Encounter (Signed)
Patient advised. Samples placed up front.  

## 2021-08-19 NOTE — Telephone Encounter (Signed)
Pt called, wanting to know if Dr. Sherrie Mustache has any more samples of Marcelline Deist he could pick up. If not pt is asking if an Rx can be sent in to the pharmacy for medication.  ? ?[facility] - [place], [place] - [address]  ?[address] [place] [address]  ?Phone: [phone] Fax: [phone]  ?Hours: Not open 24 hours  ? ?Please advise.  ?

## 2021-08-20 NOTE — Telephone Encounter (Signed)
Done

## 2021-08-20 NOTE — Telephone Encounter (Signed)
He can have 4 sample packs Farxiga 10. If we don't have any then can send prescription for #30 rf x 12 ?

## 2021-09-11 ENCOUNTER — Other Ambulatory Visit: Payer: Self-pay | Admitting: Family Medicine

## 2021-09-16 ENCOUNTER — Telehealth: Payer: Self-pay

## 2021-09-16 ENCOUNTER — Other Ambulatory Visit: Payer: Self-pay | Admitting: Family Medicine

## 2021-09-16 DIAGNOSIS — E1140 Type 2 diabetes mellitus with diabetic neuropathy, unspecified: Secondary | ICD-10-CM

## 2021-09-16 DIAGNOSIS — I10 Essential (primary) hypertension: Secondary | ICD-10-CM

## 2021-09-16 NOTE — Telephone Encounter (Signed)
The patient can have 3  sample boxes as per Dr. Sherrie Mustache ?

## 2021-09-16 NOTE — Telephone Encounter (Signed)
Please advise if ok to give samples if available.  

## 2021-09-16 NOTE — Telephone Encounter (Signed)
Copied from [place] [id number]. Topic: Quick Communication - Rx Refill/Question ?>> Sep 16, 2021  9:34 AM Erick Blinks wrote: ?Reason for CRM: Pt called requesting samples of Farxiga 10 MG, says this is what he normally receives.  ?Best contact: [phone] ?

## 2021-09-19 NOTE — Telephone Encounter (Signed)
Pt is calling to request more samples of fargia 10mg  samples ?CB- [phone] ?

## 2021-09-22 MED ORDER — DAPAGLIFLOZIN PROPANEDIOL 10 MG PO TABS: 10.0000 mg | ORAL_TABLET | Freq: Every day | ORAL | 0 refills | Status: DC

## 2021-09-22 NOTE — Telephone Encounter (Signed)
Samples are now available and placed upfront for pick up. Patient advised.  ?

## 2021-10-06 ENCOUNTER — Encounter: Payer: Self-pay | Admitting: Family Medicine

## 2021-10-06 ENCOUNTER — Ambulatory Visit (INDEPENDENT_AMBULATORY_CARE_PROVIDER_SITE_OTHER): Payer: Medicare Other | Admitting: Family Medicine

## 2021-10-06 VITALS — BP 123/57 | HR 110 | Temp 99.0°F | Resp 20 | Wt 380.0 lb

## 2021-10-06 DIAGNOSIS — E7800 Pure hypercholesterolemia, unspecified: Secondary | ICD-10-CM

## 2021-10-06 DIAGNOSIS — D689 Coagulation defect, unspecified: Secondary | ICD-10-CM

## 2021-10-06 DIAGNOSIS — I10 Essential (primary) hypertension: Secondary | ICD-10-CM | POA: Diagnosis not present

## 2021-10-06 DIAGNOSIS — E1140 Type 2 diabetes mellitus with diabetic neuropathy, unspecified: Secondary | ICD-10-CM | POA: Diagnosis not present

## 2021-10-06 DIAGNOSIS — R0609 Other forms of dyspnea: Secondary | ICD-10-CM

## 2021-10-06 DIAGNOSIS — Z125 Encounter for screening for malignant neoplasm of prostate: Secondary | ICD-10-CM

## 2021-10-06 DIAGNOSIS — I5022 Chronic systolic (congestive) heart failure: Secondary | ICD-10-CM

## 2021-10-06 NOTE — Progress Notes (Signed)
?  ? ?I,Roshena L Chambers,acting as a scribe for Lelon Huh, MD.,have documented all relevant documentation on the behalf of Lelon Huh, MD,as directed by  Lelon Huh, MD while in the presence of Lelon Huh, MD.  ? ? ?Established patient visit ? ? ?Patient: Taylor [name]   DOB: [birth year]   66 y.o. Male  MRN: [id number] ?Visit Date: 10/06/2021 ? ?Today's healthcare provider: Lelon Huh, MD  ? ?Chief Complaint  ?Patient presents with  ? Diabetes  ? Hypertension  ? ?Subjective  ?  ?HPI  ?Diabetes Mellitus Type II, Follow-up ? ?Lab Results  ?Component Value Date  ? HGBA1C 6.6 (A) 02/18/2021  ? HGBA1C 7.5 (H) 10/16/2020  ? HGBA1C 6.9 (A) 05/13/2020  ? ?Wt Readings from Last 3 Encounters:  ?10/06/21 (!) 380 lb (172.4 kg)  ?07/17/21 (!) 385 lb (174.6 kg)  ?05/12/21 (!) 387 lb 6.4 oz (175.7 kg)  ? ?Last seen for diabetes 7 months ago.  ?Management since then includes continue same medication. ?He reports good compliance with treatment. ?He is not having side effects.  ?Symptoms: ?Yes fatigue No foot ulcerations  ?No appetite changes No nausea  ?No paresthesia of the feet  No polydipsia  ?No polyuria No visual disturbances   ?No vomiting   ? ? ?Home blood sugar records: fasting range: 100-150 ? ?Episodes of hypoglycemia? No  ?  ?Current insulin regiment: none ?Most Recent Eye Exam: 11/07/2020 ?Current exercise: none ?Current diet habits: well balanced ? ?Pertinent Labs: ?Lab Results  ?Component Value Date  ? CHOL 177 10/16/2020  ? HDL 46 10/16/2020  ? LDLCALC 102 (H) 10/16/2020  ? TRIG 164 (H) 10/16/2020  ? CHOLHDL 3.8 10/16/2020  ? Lab Results  ?Component Value Date  ? NA 139 07/17/2021  ? K 4.3 07/17/2021  ? CREATININE 1.04 07/17/2021  ? EGFR 80 07/17/2021  ? MICROALBUR 20 03/31/2017  ?  ? ?---------------------------------------------------------------------------------------------------  ? ?Hypertension, follow-up ? ?BP Readings from Last 3 Encounters:  ?10/06/21 (!) 123/57  ?07/17/21 130/82   ?05/12/21 136/74  ? Wt Readings from Last 3 Encounters:  ?10/06/21 (!) 380 lb (172.4 kg)  ?07/17/21 (!) 385 lb (174.6 kg)  ?05/12/21 (!) 387 lb 6.4 oz (175.7 kg)  ?  ? ?He was last seen for hypertension 7 months ago.  ?BP at that visit was 139/88. Management since that visit includes continue same medication. ? ?He reports good compliance with treatment. ?He is not having side effects.  ?He is following a Regular diet. ?He is not exercising. ?He does not smoke. ? ?Use of agents associated with hypertension: none.  ? ?Outside blood pressures are not checked. ?Symptoms: ?No chest pain No chest pressure  ?No palpitations No syncope  ?Yes dyspnea during exertion No orthopnea  ?No paroxysmal nocturnal dyspnea Yes lower extremity edema  ? ? ?The 10-year ASCVD risk score (Arnett DK, et al., 2019) is: 22.5% ? ?---------------------------------------------------------------------------------------------------  ? ?Medications: ?Outpatient Medications Prior to Visit  ?Medication Sig  ? apixaban (ELIQUIS) 5 MG TABS tablet Take 1 tablet (5 mg total) by mouth 2 (two) times daily.  ? dapagliflozin propanediol (FARXIGA) 10 MG TABS tablet Take 1 tablet (10 mg total) by mouth daily before breakfast.  ? glipiZIDE (GLUCOTROL XL) 10 MG 24 hr tablet TAKE 1 TABLET BY MOUTH ONCE DAILY.  ? glucose blood (ONETOUCH ULTRA) test strip Use to check sugar daily for type 2 diabetes E11.9  ? HYDROmorphone (DILAUDID) 4 MG tablet Take 4 mg by mouth in the morning, at noon, in  the evening, and at bedtime. Must be name brand  ? lisinopril (ZESTRIL) 10 MG tablet TAKE 1 TABLET BY MOUTH ONCE DAILY.  ? metFORMIN (GLUCOPHAGE) 1000 MG tablet TAKE 1 TABLET BY MOUTH TWICE DAILY  ? metoprolol (TOPROL XL) 200 MG 24 hr tablet Take 1 tablet (200 mg total) by mouth daily.  ? NARCAN 4 MG/0.1ML LIQD nasal spray kit Place 0.4 mg into the nose as needed (accidental overdose).  ? OXYCONTIN 20 MG 12 hr tablet Take 20 mg by mouth in the morning and at bedtime. Take 20 mg  by mouth four times a day  ? pioglitazone (ACTOS) 45 MG tablet TAKE 1 TABLET BY MOUTH ONCE DAILY.  ? potassium chloride SA (KLOR-CON) 20 MEQ tablet Take 1 tablet (20 mEq total) by mouth daily.  ? pregabalin (LYRICA) 100 MG capsule Take 100 mg by mouth 3 (three) times daily.  ? simvastatin (ZOCOR) 40 MG tablet TAKE 1 TABLET BY MOUTH ONCE DAILY.  ? torsemide (DEMADEX) 20 MG tablet Take 40 MG (2 tabs) every AM, and Take 20 MG (1 tab) every PM.  ? zolpidem (AMBIEN CR) 12.5 MG CR tablet Take 12.5 mg by mouth at bedtime.   ? vitamin B-12 (CYANOCOBALAMIN) 1000 MCG tablet Take 1,000 mcg by mouth daily. (Patient not taking: Reported on 10/06/2021)  ? [DISCONTINUED] OXYCONTIN 30 MG 12 hr tablet Take 30 mg by mouth in the morning and at bedtime. Take 30 mg by mouth at 0500 and 1730 (Patient not taking: Reported on 10/06/2021)  ? ?No facility-administered medications prior to visit.  ? ? ?Review of Systems  ?Constitutional:  Positive for fatigue. Negative for appetite change, chills and fever.  ?Respiratory:  Positive for shortness of breath (during exertion). Negative for chest tightness and wheezing.   ?Cardiovascular:  Positive for leg swelling. Negative for chest pain and palpitations.  ?Gastrointestinal:  Positive for abdominal pain (RUQ, after falling 1 month ago). Negative for nausea and vomiting.  ?Musculoskeletal:  Positive for joint swelling (in feet).  ? ? ?  Objective  ?  ?BP (!) 123/57 (BP Location: Right Arm, Patient Position: Sitting, Cuff Size: Large)   Pulse (!) 110   Temp 99 ?F (37.2 ?C) (Oral)   Resp 20 Comment: labored  Wt (!) 380 lb (172.4 kg)   SpO2 96% Comment: room air  BMI 50.13 kg/m?  ? ? ?Physical Exam  ? ? ?General: Appearance:    Obese male in no acute distress  ?Eyes:    PERRL, conjunctiva/corneas clear, EOM's intact       ?Lungs:     Clear to auscultation bilaterally, respirations unlabored  ?Heart:    Tachycardic. Irregularly irregular rhythm. No murmurs, rubs, or gallops.    ?MS:   All  extremities are intact.    ?Neurologic:   Awake, alert, oriented x 3. No apparent focal neurological defect.   ?   ?  ? Assessment & Plan  ?  ? ?1. Primary hypertension ?Well controlled.  Continue current medications.   ? ?2. Chronic HFrEF (heart failure with reduced ejection fraction) ([place]) ?Is having some DOE.  ? ?3. Hypercholesteremia ?He is tolerating simvastatin well with no adverse effects.   ?- Comprehensive metabolic panel ?- Lipid panel ? ?4. Acquired coagulation disorder ([place]) ?Continue DOAC for a-fib. No sign of abnormal bleeding.  ?- CBC ? ?5. Type 2 diabetes mellitus with diabetic neuropathy, without long-term current use of insulin ([place]) ? ?- Hemoglobin A1c ? ?Discussed trial of Ozempic. He tried Rybelsus a  few years ago which he stopped due to nausea. Would probably tolerate Monjoura better, but his insurance prefers Ozempic. Will try Ozempic samples when we get some in.  ?6. Dyspnea on exertion ? ?- Brain natriuretic peptide ? ?7. Prostate cancer screening ? ?- PSA Total (Reflex To Free) ([facility] only) ? ?8. Morbid obesity ([place]) ? ?- TSH  ?   ? ?The entirety of the information documented in the History of Present Illness, Review of Systems and Physical Exam were personally obtained by me. Portions of this information were initially documented by the CMA and reviewed by me for thoroughness and accuracy.   ? ? ?Lelon Huh, MD  ?[facility] ?[phone] (phone) ?[phone] (fax) ? ?[facility]  ?

## 2021-10-06 NOTE — Patient Instructions (Addendum)
Please review the attached list of medications and notify my office if there are any errors.  ? ?Try OTC alpha-lipoic acid 600 three a day to see if it helps with neuropathy. If it's not helping after a month you can stop taking it.  ? ?We're going to try samples of Ozempic. We are out of samples now, but I'll call you when we get some in.  ?

## 2021-11-11 ENCOUNTER — Telehealth: Payer: Self-pay

## 2021-11-11 NOTE — Telephone Encounter (Signed)
Copied from [place] [id number]. Topic: General - Other >> Nov 11, 2021  9:25 AM Yvette Rack wrote: Reason for CRM: Pt would like to know if there are any samples of the dapagliflozin propanediol (FARXIGA) 10 MG TABS tablet. Pt requests call back to advise. Cb# [phone]

## 2021-11-11 NOTE — Telephone Encounter (Signed)
Samples put at front desk  

## 2021-12-08 NOTE — Telephone Encounter (Signed)
That's fine. He can have 3 weeks worth. Also, he needs to get labs done that were ordered in May.

## 2021-12-08 NOTE — Telephone Encounter (Signed)
Pt calling again to see if can get more samples [place] call [phone]

## 2021-12-10 ENCOUNTER — Telehealth: Payer: Self-pay

## 2021-12-10 ENCOUNTER — Ambulatory Visit: Payer: Self-pay | Admitting: *Deleted

## 2021-12-10 MED ORDER — DAPAGLIFLOZIN PROPANEDIOL 5 MG PO TABS: 10.0000 mg | ORAL_TABLET | Freq: Every day | ORAL | 0 refills | Status: AC

## 2021-12-10 MED ORDER — TORSEMIDE 20 MG PO TABS: ORAL_TABLET | ORAL | 3 refills | Status: DC

## 2021-12-10 NOTE — Telephone Encounter (Signed)
We are out of the 10mg  tablets. We do have the 5mg  tablets available. Please advise if ok to give a couple of the 5mg  boxes.

## 2021-12-10 NOTE — Telephone Encounter (Signed)
Reason for Disposition  [1] Follow-up call to recent contact AND [2] information only call, no triage required  Answer Assessment - Initial Assessment Questions 1. REASON FOR CALL or QUESTION: "What is your reason for calling today?" or "How can I best help you?" or "What question do you have that I can help answer?"     Message from Dr. Sherrie Mustache given to pt regarding coming in and picking up samples of Farxiga.   Also he needs to get his lab work drawn that was ordered in May.  Pt was agreeable to doing this.  Protocols used: Information Only Call - No Triage-A-AH

## 2021-12-10 NOTE — Telephone Encounter (Signed)
We have 4 boxes available. Tried calling patient. Left message to call back. Samples placed up front. OK for [facility] triage to advise that samples of Farxiga 5mg  tablets are ready to pick up. Advise patient that he will need to take 2 tablets daily to make 10mg .   Also, please advise patient of Dr. message below:  "he needs to get labs done that were ordered in May".

## 2021-12-10 NOTE — Addendum Note (Signed)
Addended by: Benjiman Core on: 12/10/2021 02:37 PM   Modules accepted: Orders

## 2021-12-10 NOTE — Telephone Encounter (Signed)
Lannie Fields, RN 2 minutes ago (2:43 PM)    Reason for Disposition  [1] Follow-up call to recent contact AND [2] information only call, no triage required  Answer Assessment - Initial Assessment Questions 1. REASON FOR CALL or QUESTION: "What is your reason for calling today?" or "How can I best help you?" or "What question do you have that I can help answer?"     Message from Dr. Sherrie Mustache given to pt regarding coming in and picking up samples of Farxiga.   Also he needs to get his lab work drawn that was ordered in May.  Pt was agreeable to doing this.  Protocols used: Information Only Call - No Triage-A-AH       Note    [place], [place] [address]" [phone]  Lannie Fields, RN

## 2021-12-10 NOTE — Telephone Encounter (Signed)
Please review for a refill on Torsemide 20 mg. The patient was to have lab work done

## 2021-12-10 NOTE — Telephone Encounter (Signed)
That's fine, he can six boxes of the 5mg  tablets if we have them.

## 2021-12-22 ENCOUNTER — Other Ambulatory Visit: Payer: Self-pay | Admitting: Cardiology

## 2021-12-23 NOTE — Telephone Encounter (Signed)
Prescription refill request for Eliquis received. Indication: Atrial Fib Last office visit: 07/17/21  Agbor-Etang MD Scr: 1.04 on 07/17/21 Age: 66 Weight: 174.6kg  Based on above findings Eliquis 5mg  twice daily is the appropriate dose.  Refill approved.

## 2021-12-25 ENCOUNTER — Telehealth: Payer: Self-pay | Admitting: Family Medicine

## 2021-12-25 NOTE — Telephone Encounter (Signed)
Copied from CRM [id number]. Topic: Medicare AWV >> Dec 25, 2021 10:25 AM Zannie Kehr wrote: Reason for CRM:  Left message for patient to call back and schedule Medicare Annual Wellness Visit (AWV) in office.   If unable to come into the office for AWV,  please offer to do virtually or by telephone.  Last AWV: 10/16/2020  Please schedule at anytime with [facility].  30 minute appointment for Virtual or phone 45 minute appointment for in office or Initial virtual/phone  Any questions, please contact me at [phone]

## 2021-12-26 ENCOUNTER — Ambulatory Visit: Payer: Medicare Other | Admitting: Family Medicine

## 2022-01-12 ENCOUNTER — Ambulatory Visit: Payer: Medicare Other | Admitting: Family Medicine

## 2022-01-12 NOTE — Progress Notes (Deleted)
I,Taylor [name],acting as a scribe for Taylor Huh, MD.,have documented all relevant documentation on the behalf of Taylor Huh, MD,as directed by  Taylor Huh, MD while in the presence of Taylor Huh, MD.   Established patient visit   Patient: Taylor [name]   DOB: [birth year]   66 y.o. Male  MRN: [id number] Visit Date: 01/12/2022  Today's healthcare provider: Lelon Huh, MD   No chief complaint on file.  Subjective    HPI  Diabetes Mellitus Type II, Follow-up  Lab Results  Component Value Date   HGBA1C 6.6 (A) 02/18/2021   HGBA1C 7.5 (H) 10/16/2020   HGBA1C 6.9 (A) 05/13/2020   Wt Readings from Last 3 Encounters:  10/06/21 (!) 380 lb (172.4 kg)  07/17/21 (!) 385 lb (174.6 kg)  05/12/21 (!) 387 lb 6.4 oz (175.7 kg)   Last seen for diabetes 3 months ago.  Management since then includes: Insurance prefers [employer]. Will try Ozempic samples when we get some in. He reports {excellent/good/fair/poor:19665} compliance with treatment. He {is/is not:21021397} having side effects. {document side effects if present:1} Symptoms: {Yes/No:20286} fatigue {Yes/No:20286} foot ulcerations  {Yes/No:20286} appetite changes {Yes/No:20286} nausea  {Yes/No:20286} paresthesia of the feet  {Yes/No:20286} polydipsia  {Yes/No:20286} polyuria {Yes/No:20286} visual disturbances   {Yes/No:20286} vomiting     Home blood sugar records: {diabetes glucometry results:16657}  Episodes of hypoglycemia? {Yes/No:20286}   Current insulin regiment:Most Recent Eye Exam: 11-07-20 {Current exercise:16438:::1} {Current diet habits:16563:::1}  Pertinent Labs: Lab Results  Component Value Date   CHOL 177 10/16/2020   HDL 46 10/16/2020   LDLCALC 102 (H) 10/16/2020   TRIG 164 (H) 10/16/2020   CHOLHDL 3.8 10/16/2020   Lab Results  Component Value Date   NA 139 07/17/2021   K 4.3 07/17/2021   CREATININE 1.04 07/17/2021   EGFR 80 07/17/2021   MICROALBUR 20 03/31/2017      --------------------------------------------------------------------------------------------------- Hypertension, follow-up  BP Readings from Last 3 Encounters:  10/06/21 (!) 123/57  07/17/21 130/82  05/12/21 136/74   Wt Readings from Last 3 Encounters:  10/06/21 (!) 380 lb (172.4 kg)  07/17/21 (!) 385 lb (174.6 kg)  05/12/21 (!) 387 lb 6.4 oz (175.7 kg)     He was last seen for hypertension 3 months ago.  BP at that visit was 123/57. Management since that visit includes no changes.  He reports {excellent/good/fair/poor:19665} compliance with treatment. He {is/is not:9024} having side effects.  He is following a {diet:21022986} diet. He {is/is not:9024} exercising. He {does/does not:200015} smoke.  Use of agents associated with hypertension: {bp agents assoc with hypertension:511::"none"}.   Outside blood pressures are  Symptoms: {Yes/No:20286} chest pain {Yes/No:20286} chest pressure  {Yes/No:20286} palpitations {Yes/No:20286} syncope  {Yes/No:20286} dyspnea {Yes/No:20286} orthopnea  {Yes/No:20286} paroxysmal nocturnal dyspnea {Yes/No:20286} lower extremity edema   Pertinent labs Lab Results  Component Value Date   CHOL 177 10/16/2020   HDL 46 10/16/2020   LDLCALC 102 (H) 10/16/2020   TRIG 164 (H) 10/16/2020   CHOLHDL 3.8 10/16/2020   Lab Results  Component Value Date   NA 139 07/17/2021   K 4.3 07/17/2021   CREATININE 1.04 07/17/2021   EGFR 80 07/17/2021   GLUCOSE 137 (H) 07/17/2021   TSH 3.610 04/07/2018     The 10-year ASCVD risk score (Arnett DK, et al., 2019) is: 22.5%  ---------------------------------------------------------------------------------------------------  Medications: Outpatient Medications Prior to Visit  Medication Sig   apixaban (ELIQUIS) 5 MG TABS tablet TAKE 1 TABLET BY MOUTH TWICE DAILY   dapagliflozin propanediol (FARXIGA) 10 MG TABS  tablet Take 1 tablet (10 mg total) by mouth daily before breakfast.   glipiZIDE (GLUCOTROL XL)  10 MG 24 hr tablet TAKE 1 TABLET BY MOUTH ONCE DAILY.   glucose blood (ONETOUCH ULTRA) test strip Use to check sugar daily for type 2 diabetes E11.9   HYDROmorphone (DILAUDID) 4 MG tablet Take 4 mg by mouth in the morning, at noon, in the evening, and at bedtime. Must be name brand   lisinopril (ZESTRIL) 10 MG tablet TAKE 1 TABLET BY MOUTH ONCE DAILY.   metFORMIN (GLUCOPHAGE) 1000 MG tablet TAKE 1 TABLET BY MOUTH TWICE DAILY   metoprolol (TOPROL XL) 200 MG 24 hr tablet Take 1 tablet (200 mg total) by mouth daily.   MOVANTIK 25 MG TABS tablet    NARCAN 4 MG/0.1ML LIQD nasal spray kit Place 0.4 mg into the nose as needed (accidental overdose).   OXYCONTIN 20 MG 12 hr tablet Take 20 mg by mouth in the morning and at bedtime. Take 20 mg by mouth four times a day   pioglitazone (ACTOS) 45 MG tablet TAKE 1 TABLET BY MOUTH ONCE DAILY.   potassium chloride SA (KLOR-CON) 20 MEQ tablet Take 1 tablet (20 mEq total) by mouth daily.   pregabalin (LYRICA) 100 MG capsule Take 100 mg by mouth 3 (three) times daily.   simvastatin (ZOCOR) 40 MG tablet TAKE 1 TABLET BY MOUTH ONCE DAILY.   torsemide (DEMADEX) 20 MG tablet Take 40 MG (2 tabs) every AM, and Take 20 MG (1 tab) every PM.   vitamin B-12 (CYANOCOBALAMIN) 1000 MCG tablet Take 1,000 mcg by mouth daily.   zolpidem (AMBIEN CR) 12.5 MG CR tablet Take 12.5 mg by mouth at bedtime.    No facility-administered medications prior to visit.    Review of Systems  {Labs  Heme  Chem  Endocrine  Serology  Results Review (optional):23779}   Objective    There were no vitals taken for this visit. {Show previous vital signs (optional):23777}  Physical Exam  ***  No results found for any visits on 01/12/22.  Assessment & Plan     ***  No follow-ups on file.      {provider attestation***:1}   Taylor Huh, MD  [facility] [phone] (phone) [phone] (fax)  [place]

## 2022-01-13 ENCOUNTER — Emergency Department (HOSPITAL_COMMUNITY): Payer: Medicare Other

## 2022-01-13 ENCOUNTER — Inpatient Hospital Stay (HOSPITAL_COMMUNITY)
Admission: EM | Admit: 2022-01-13 | Discharge: 2022-01-26 | DRG: 682 | Disposition: A | Discharge status: Home Health | Payer: Medicare Other | Attending: Family Medicine | Admitting: Family Medicine

## 2022-01-13 ENCOUNTER — Encounter (HOSPITAL_COMMUNITY): Payer: Self-pay

## 2022-01-13 ENCOUNTER — Other Ambulatory Visit: Payer: Self-pay

## 2022-01-13 DIAGNOSIS — Z20822 Contact with and (suspected) exposure to covid-19: Secondary | ICD-10-CM | POA: Diagnosis present

## 2022-01-13 DIAGNOSIS — G4733 Obstructive sleep apnea (adult) (pediatric): Secondary | ICD-10-CM | POA: Diagnosis present

## 2022-01-13 DIAGNOSIS — D649 Anemia, unspecified: Secondary | ICD-10-CM | POA: Diagnosis present

## 2022-01-13 DIAGNOSIS — E876 Hypokalemia: Secondary | ICD-10-CM | POA: Diagnosis not present

## 2022-01-13 DIAGNOSIS — J9601 Acute respiratory failure with hypoxia: Secondary | ICD-10-CM | POA: Diagnosis present

## 2022-01-13 DIAGNOSIS — E1140 Type 2 diabetes mellitus with diabetic neuropathy, unspecified: Secondary | ICD-10-CM | POA: Diagnosis present

## 2022-01-13 DIAGNOSIS — I959 Hypotension, unspecified: Secondary | ICD-10-CM | POA: Diagnosis not present

## 2022-01-13 DIAGNOSIS — G928 Other toxic encephalopathy: Secondary | ICD-10-CM | POA: Diagnosis not present

## 2022-01-13 DIAGNOSIS — R569 Unspecified convulsions: Secondary | ICD-10-CM | POA: Diagnosis not present

## 2022-01-13 DIAGNOSIS — G9340 Encephalopathy, unspecified: Secondary | ICD-10-CM

## 2022-01-13 DIAGNOSIS — Z452 Encounter for adjustment and management of vascular access device: Secondary | ICD-10-CM | POA: Diagnosis not present

## 2022-01-13 DIAGNOSIS — I2510 Atherosclerotic heart disease of native coronary artery without angina pectoris: Secondary | ICD-10-CM | POA: Diagnosis present

## 2022-01-13 DIAGNOSIS — F40240 Claustrophobia: Secondary | ICD-10-CM | POA: Diagnosis present

## 2022-01-13 DIAGNOSIS — K429 Umbilical hernia without obstruction or gangrene: Secondary | ICD-10-CM | POA: Diagnosis not present

## 2022-01-13 DIAGNOSIS — Z833 Family history of diabetes mellitus: Secondary | ICD-10-CM

## 2022-01-13 DIAGNOSIS — J9602 Acute respiratory failure with hypercapnia: Secondary | ICD-10-CM | POA: Diagnosis not present

## 2022-01-13 DIAGNOSIS — R339 Retention of urine, unspecified: Secondary | ICD-10-CM | POA: Diagnosis not present

## 2022-01-13 DIAGNOSIS — E1122 Type 2 diabetes mellitus with diabetic chronic kidney disease: Secondary | ICD-10-CM | POA: Diagnosis not present

## 2022-01-13 DIAGNOSIS — Z7984 Long term (current) use of oral hypoglycemic drugs: Secondary | ICD-10-CM

## 2022-01-13 DIAGNOSIS — R579 Shock, unspecified: Secondary | ICD-10-CM

## 2022-01-13 DIAGNOSIS — E871 Hypo-osmolality and hyponatremia: Secondary | ICD-10-CM | POA: Diagnosis present

## 2022-01-13 DIAGNOSIS — E874 Mixed disorder of acid-base balance: Secondary | ICD-10-CM | POA: Diagnosis present

## 2022-01-13 DIAGNOSIS — R0602 Shortness of breath: Secondary | ICD-10-CM | POA: Diagnosis not present

## 2022-01-13 DIAGNOSIS — K760 Fatty (change of) liver, not elsewhere classified: Secondary | ICD-10-CM | POA: Diagnosis present

## 2022-01-13 DIAGNOSIS — I130 Hypertensive heart and chronic kidney disease with heart failure and stage 1 through stage 4 chronic kidney disease, or unspecified chronic kidney disease: Secondary | ICD-10-CM | POA: Diagnosis present

## 2022-01-13 DIAGNOSIS — F419 Anxiety disorder, unspecified: Secondary | ICD-10-CM | POA: Diagnosis present

## 2022-01-13 DIAGNOSIS — K3189 Other diseases of stomach and duodenum: Secondary | ICD-10-CM | POA: Diagnosis not present

## 2022-01-13 DIAGNOSIS — I5020 Unspecified systolic (congestive) heart failure: Secondary | ICD-10-CM | POA: Diagnosis not present

## 2022-01-13 DIAGNOSIS — I4821 Permanent atrial fibrillation: Secondary | ICD-10-CM | POA: Diagnosis not present

## 2022-01-13 DIAGNOSIS — I428 Other cardiomyopathies: Secondary | ICD-10-CM | POA: Diagnosis not present

## 2022-01-13 DIAGNOSIS — R410 Disorientation, unspecified: Principal | ICD-10-CM

## 2022-01-13 DIAGNOSIS — G319 Degenerative disease of nervous system, unspecified: Secondary | ICD-10-CM | POA: Diagnosis not present

## 2022-01-13 DIAGNOSIS — R0603 Acute respiratory distress: Secondary | ICD-10-CM | POA: Diagnosis not present

## 2022-01-13 DIAGNOSIS — Z6841 Body Mass Index (BMI) 40.0 and over, adult: Secondary | ICD-10-CM

## 2022-01-13 DIAGNOSIS — N179 Acute kidney failure, unspecified: Secondary | ICD-10-CM | POA: Diagnosis not present

## 2022-01-13 DIAGNOSIS — R4182 Altered mental status, unspecified: Secondary | ICD-10-CM | POA: Diagnosis not present

## 2022-01-13 DIAGNOSIS — R079 Chest pain, unspecified: Secondary | ICD-10-CM | POA: Diagnosis not present

## 2022-01-13 DIAGNOSIS — D696 Thrombocytopenia, unspecified: Secondary | ICD-10-CM | POA: Diagnosis not present

## 2022-01-13 DIAGNOSIS — N170 Acute kidney failure with tubular necrosis: Secondary | ICD-10-CM | POA: Diagnosis not present

## 2022-01-13 DIAGNOSIS — I5043 Acute on chronic combined systolic (congestive) and diastolic (congestive) heart failure: Secondary | ICD-10-CM | POA: Diagnosis not present

## 2022-01-13 DIAGNOSIS — Z79891 Long term (current) use of opiate analgesic: Secondary | ICD-10-CM

## 2022-01-13 DIAGNOSIS — E861 Hypovolemia: Secondary | ICD-10-CM | POA: Diagnosis not present

## 2022-01-13 DIAGNOSIS — F32A Depression, unspecified: Secondary | ICD-10-CM | POA: Diagnosis present

## 2022-01-13 DIAGNOSIS — Z7901 Long term (current) use of anticoagulants: Secondary | ICD-10-CM

## 2022-01-13 DIAGNOSIS — N200 Calculus of kidney: Secondary | ICD-10-CM | POA: Diagnosis not present

## 2022-01-13 DIAGNOSIS — I509 Heart failure, unspecified: Secondary | ICD-10-CM | POA: Diagnosis not present

## 2022-01-13 DIAGNOSIS — R578 Other shock: Secondary | ICD-10-CM | POA: Diagnosis not present

## 2022-01-13 DIAGNOSIS — G894 Chronic pain syndrome: Secondary | ICD-10-CM | POA: Diagnosis present

## 2022-01-13 DIAGNOSIS — G4089 Other seizures: Secondary | ICD-10-CM | POA: Diagnosis not present

## 2022-01-13 DIAGNOSIS — K5730 Diverticulosis of large intestine without perforation or abscess without bleeding: Secondary | ICD-10-CM | POA: Diagnosis not present

## 2022-01-13 DIAGNOSIS — E785 Hyperlipidemia, unspecified: Secondary | ICD-10-CM | POA: Diagnosis present

## 2022-01-13 DIAGNOSIS — I4891 Unspecified atrial fibrillation: Secondary | ICD-10-CM | POA: Diagnosis not present

## 2022-01-13 DIAGNOSIS — T426X5A Adverse effect of other antiepileptic and sedative-hypnotic drugs, initial encounter: Secondary | ICD-10-CM | POA: Diagnosis not present

## 2022-01-13 DIAGNOSIS — J810 Acute pulmonary edema: Secondary | ICD-10-CM | POA: Diagnosis not present

## 2022-01-13 DIAGNOSIS — I5023 Acute on chronic systolic (congestive) heart failure: Secondary | ICD-10-CM

## 2022-01-13 DIAGNOSIS — J811 Chronic pulmonary edema: Secondary | ICD-10-CM | POA: Diagnosis not present

## 2022-01-13 DIAGNOSIS — R112 Nausea with vomiting, unspecified: Secondary | ICD-10-CM | POA: Diagnosis not present

## 2022-01-13 DIAGNOSIS — I4819 Other persistent atrial fibrillation: Secondary | ICD-10-CM

## 2022-01-13 DIAGNOSIS — Z79899 Other long term (current) drug therapy: Secondary | ICD-10-CM

## 2022-01-13 DIAGNOSIS — E7800 Pure hypercholesterolemia, unspecified: Secondary | ICD-10-CM | POA: Diagnosis present

## 2022-01-13 DIAGNOSIS — J8410 Pulmonary fibrosis, unspecified: Secondary | ICD-10-CM | POA: Diagnosis not present

## 2022-01-13 DIAGNOSIS — J9809 Other diseases of bronchus, not elsewhere classified: Secondary | ICD-10-CM | POA: Diagnosis not present

## 2022-01-13 DIAGNOSIS — J9811 Atelectasis: Secondary | ICD-10-CM | POA: Diagnosis not present

## 2022-01-13 DIAGNOSIS — D72829 Elevated white blood cell count, unspecified: Secondary | ICD-10-CM | POA: Diagnosis not present

## 2022-01-13 HISTORY — DX: Atherosclerotic heart disease of native coronary artery without angina pectoris: I25.10

## 2022-01-13 HISTORY — DX: Other cardiomyopathies: I42.8

## 2022-01-13 LAB — CBC WITH DIFFERENTIAL/PLATELET
Abs Immature Granulocytes: 0.03 10*3/uL (ref 0.00–0.07)
Basophils Absolute: 0.1 10*3/uL (ref 0.0–0.1)
Basophils Relative: 1 %
Eosinophils Absolute: 0.3 10*3/uL (ref 0.0–0.5)
Eosinophils Relative: 3 %
HCT: 40.5 % (ref 39.0–52.0)
Hemoglobin: 13 g/dL (ref 13.0–17.0)
Immature Granulocytes: 0 %
Lymphocytes Relative: 23 %
Lymphs Abs: 2 10*3/uL (ref 0.7–4.0)
MCH: 28.3 pg (ref 26.0–34.0)
MCHC: 32.1 g/dL (ref 30.0–36.0)
MCV: 88 fL (ref 80.0–100.0)
Monocytes Absolute: 0.6 10*3/uL (ref 0.1–1.0)
Monocytes Relative: 7 %
Neutro Abs: 5.8 10*3/uL (ref 1.7–7.7)
Neutrophils Relative %: 66 %
Platelets: 207 10*3/uL (ref 150–400)
RBC: 4.6 MIL/uL (ref 4.22–5.81)
RDW: 15.9 % — ABNORMAL HIGH (ref 11.5–15.5)
WBC: 8.8 10*3/uL (ref 4.0–10.5)
nRBC: 0 % (ref 0.0–0.2)

## 2022-01-13 LAB — LIPASE, BLOOD: Lipase: 25 U/L (ref 11–51)

## 2022-01-13 LAB — BLOOD GAS, VENOUS
Acid-base deficit: 4.3 mmol/L — ABNORMAL HIGH (ref 0.0–2.0)
Bicarbonate: 23.3 mmol/L (ref 20.0–28.0)
Drawn by: 59715
O2 Saturation: 58.1 %
Patient temperature: 37.1
pCO2, Ven: 52 mmHg (ref 44–60)
pH, Ven: 7.26 (ref 7.25–7.43)
pO2, Ven: 39 mmHg (ref 32–45)

## 2022-01-13 LAB — CBG MONITORING, ED: Glucose-Capillary: 155 mg/dL — ABNORMAL HIGH (ref 70–99)

## 2022-01-13 LAB — ETHANOL: Alcohol, Ethyl (B): <10 mg/dL (ref <10)

## 2022-01-13 MED ORDER — ONDANSETRON HCL 4 MG/2ML IJ SOLN
4.0000 mg | Freq: Once | INTRAMUSCULAR | Status: AC
Start: 2022-01-13 — End: 2022-01-13
  Administered 2022-01-13: 4 mg via INTRAVENOUS
  Filled 2022-01-13: qty 2

## 2022-01-13 MED ORDER — LACTATED RINGERS IV BOLUS
500.0000 mL | Freq: Once | INTRAVENOUS | Status: AC
  Administered 2022-01-13: 500 mL via INTRAVENOUS

## 2022-01-13 MED ORDER — ONDANSETRON 4 MG PO TBDP: 4.0000 mg | ORAL_TABLET | Freq: Once | ORAL | Status: DC

## 2022-01-13 NOTE — ED Triage Notes (Signed)
Pov from home with wife.  Pt able to answer orientation questions but unable to tell us why he is here . Keeps saying "[name]"  when asked why he is here. Says that he has dry mouth.  Keeps falling asleep during triage. Says that he has been sleepy last couple days.  Able to arouse.  Takes pain medications at home.

## 2022-01-13 NOTE — ED Provider Notes (Signed)
[facility] EMERGENCY DEPARTMENT Provider Note  CSN: [id number] Arrival date & time: 01/13/22 2238  Chief Complaint(s) Altered Mental Status  HPI Taylor [name] is a 66 y.o. male with PMH HTN, CHF with last EF 35 to 40%, HLD, persistent A-fib on Eliquis, T2DM, morbid obesity, chronic back pain on multiple sedative medications and pain medications including Ambien, oxycodone, Dilaudid and Lyrica presents emergency department for evaluation of altered mental status.  Patient states that 5 days ago he took a trip to [facility] and patient's sisters states that ever since returning from his trip he has not been the same.  She states that he has been excessively sleepy and fatigued with associated nausea and keeps falling asleep during my exam.  She states that this has been a rapid change and is very different than his baseline.  The patient takes his own medications at home with last doses of all his medications at 5 PM today.   Past Medical History Past Medical History:  Diagnosis Date   Arthritis    Broken leg 02/07/2019   Depression    Diabetes mellitus without complication ([place])    Hypertension    Patient Active Problem List   Diagnosis Date Noted   Type 2 diabetes mellitus with diabetic neuropathy, unspecified ([place]) 10/06/2021   Acquired coagulation disorder ([place]) 10/06/2021   Neuropathy 05/13/2020   CAD (coronary artery disease) 04/09/2020   HFrEF (heart failure with reduced ejection fraction) (HCC)    OSA on CPAP 03/11/2020   Chronic HFrEF (heart failure with reduced ejection fraction) ([place]) 01/30/2020   Excessive daytime sleepiness 01/23/2020   Persistent atrial fibrillation (HCC) 11/07/2019   Abnormal MRI, lumbar spine (2016) 02/15/2018   Lumbar facet arthropathy (Bilateral) 02/15/2018   Lumbar facet syndrome 02/15/2018   Spondylosis without myelopathy or radiculopathy, lumbosacral region 02/15/2018   Disorder of skeletal system 02/15/2018   Opiate use (304  MME/day) 02/15/2018   Osteoarthritis of facet joint of lumbar spine 02/15/2018   Chronic musculoskeletal pain 02/15/2018   Neurogenic pain 02/15/2018   DDD (degenerative disc disease), cervical 02/15/2018   DDD (degenerative disc disease), lumbar 02/15/2018   Morbid obesity with BMI of 40.0-44.9, adult ([place]) 02/15/2018   Therapeutic opioid induced constipation 02/15/2018   Opioid-induced sleep disorder ([place]) 02/15/2018   Opioid-induced sleep disorder with moderate or severe use disorder, insomnia type ([place]) 02/15/2018   Opioid-induced mood disorder with depressive symptoms ([place]) 02/15/2018   Opioid-induced sexual dysfunction ([place]) 02/15/2018   Hypotestosteronism 02/15/2018   Cervical foraminal stenosis (Left: C4-5) (Bilateral: C5-6) 02/15/2018   Abnormal MRI, cervical spine (2013) 02/15/2018   Drug-induced erectile dysfunction 02/15/2018   First degree AV block 01/19/2018   Fatty liver 05/07/2015   Failed back surgical syndrome 11/15/2014   Chronic low back pain (Bilateral)  11/15/2014   Dyspnea on exertion 11/15/2014   Hypogonadism, male 11/15/2014   Cervicalgia 11/15/2014   Nocturia 11/15/2014   Disturbance of skin sensation 11/15/2014   Seborrheic keratosis 11/15/2014   Thoracic back pain 11/15/2014   Hepatomegaly 03/27/2013   Generalized hyperhidrosis 10/18/2009   History of depression 03/27/2008   Premature beats 06/04/2007   Chronic pain syndrome 03/03/2007   Diabetes mellitus type 2, uncontrolled 03/03/2007   Hypercholesteremia 03/03/2007   Primary hypertension 03/03/2007   Morbid obesity ([place]) 03/03/2007   Home Medication(s) Prior to Admission medications   Medication Sig Start Date End Date Taking? Authorizing Provider  apixaban (ELIQUIS) 5 MG TABS tablet TAKE 1 TABLET BY MOUTH TWICE DAILY 12/23/21  Kate Sable, MD  dapagliflozin propanediol (FARXIGA) 10 MG TABS tablet Take 1 tablet (10 mg total) by mouth daily before breakfast. 09/22/21   Birdie Sons, MD   glipiZIDE (GLUCOTROL XL) 10 MG 24 hr tablet TAKE 1 TABLET BY MOUTH ONCE DAILY. 04/14/21   Birdie Sons, MD  glucose blood (ONETOUCH ULTRA) test strip Use to check sugar daily for type 2 diabetes E11.9 09/25/19   Birdie Sons, MD  HYDROmorphone (DILAUDID) 4 MG tablet Take 4 mg by mouth in the morning, at noon, in the evening, and at bedtime. Must be name brand    [provider]  lisinopril (ZESTRIL) 10 MG tablet TAKE 1 TABLET BY MOUTH ONCE DAILY. 09/16/21   Birdie Sons, MD  metFORMIN (GLUCOPHAGE) 1000 MG tablet TAKE 1 TABLET BY MOUTH TWICE DAILY 06/10/21   Birdie Sons, MD  metoprolol (TOPROL XL) 200 MG 24 hr tablet Take 1 tablet (200 mg total) by mouth daily. 07/17/21   Kate Sable, MD  MOVANTIK 25 MG TABS tablet  05/16/21   [provider]  NARCAN 4 MG/0.1ML LIQD nasal spray kit Place 0.4 mg into the nose as needed (accidental overdose). 07/04/18   [provider]  OXYCONTIN 20 MG 12 hr tablet Take 20 mg by mouth in the morning and at bedtime. Take 20 mg by mouth four times a day 03/30/20   [provider]  pioglitazone (ACTOS) 45 MG tablet TAKE 1 TABLET BY MOUTH ONCE DAILY. 06/25/21   Birdie Sons, MD  potassium chloride SA (KLOR-CON) 20 MEQ tablet Take 1 tablet (20 mEq total) by mouth daily. 04/24/21   Kate Sable, MD  pregabalin (LYRICA) 100 MG capsule Take 100 mg by mouth 3 (three) times daily.    [provider]  simvastatin (ZOCOR) 40 MG tablet TAKE 1 TABLET BY MOUTH ONCE DAILY. 09/11/21   Birdie Sons, MD  torsemide (DEMADEX) 20 MG tablet Take 40 MG (2 tabs) every AM, and Take 20 MG (1 tab) every PM. 12/10/21   Kate Sable, MD  vitamin B-12 (CYANOCOBALAMIN) 1000 MCG tablet Take 1,000 mcg by mouth daily.    [provider]  zolpidem (AMBIEN CR) 12.5 MG CR tablet Take 12.5 mg by mouth at bedtime.  03/03/07   [provider]                                                                                                                                     Past Surgical History Past Surgical History:  Procedure Laterality Date   BACK SURGERY     CARDIOVERSION N/A 08/14/2020   Procedure: CARDIOVERSION;  Surgeon: Kate Sable, MD;  Location: [facility] ORS;  Service: Cardiovascular;  Laterality: N/A;   CARDIOVERSION N/A 03/03/2021   Procedure: CARDIOVERSION (CATH LAB);  Surgeon: Vickie Epley, MD;  Location: [place] CV LAB;  Service: Cardiovascular;  Laterality: N/A;   LEFT HEART  CATH AND CORONARY ANGIOGRAPHY Left 04/05/2020   Procedure: LEFT HEART CATH AND CORONARY ANGIOGRAPHY;  Surgeon: Wellington Hampshire, MD;  Location: [place] CV LAB;  Service: Cardiovascular;  Laterality: Left;   TEE WITHOUT CARDIOVERSION N/A 03/03/2021   Procedure: TRANSESOPHAGEAL ECHOCARDIOGRAM (TEE);  Surgeon: Vickie Epley, MD;  Location: [place] CV LAB;  Service: Cardiovascular;  Laterality: N/A;   Family History Family History  Problem Relation Age of Onset   Diabetes Mother    Diabetes Sister     Social History Social History   Tobacco Use   Smoking status: Never   Smokeless tobacco: Never  Vaping Use   Vaping Use: Never used  Substance Use Topics   Alcohol use: No    Alcohol/week: 0.0 standard drinks of alcohol   Drug use: No   Allergies Other  Review of Systems Review of Systems  Constitutional:  Positive for fatigue.  Respiratory:  Positive for shortness of breath.   Gastrointestinal:  Positive for nausea and vomiting.  Psychiatric/Behavioral:  Positive for confusion.     Physical Exam Vital Signs  I have reviewed the triage vital signs BP 100/60   Pulse 94   Temp 98.8 F (37.1 C)   Resp 12   Ht '6\' 1"'  (1.854 m)   Wt (!) 173 kg   SpO2 100%   BMI 50.32 kg/m   Physical Exam Vitals and nursing note reviewed.  Constitutional:      General: He is not in acute distress.    Appearance: He is well-developed. He is ill-appearing.  HENT:     Head: Normocephalic  and atraumatic.  Eyes:     Conjunctiva/sclera: Conjunctivae normal.  Cardiovascular:     Rate and Rhythm: Normal rate and regular rhythm.     Heart sounds: No murmur heard. Pulmonary:     Effort: Pulmonary effort is normal. No respiratory distress.     Breath sounds: Rhonchi present.  Abdominal:     Palpations: Abdomen is soft.     Tenderness: There is no abdominal tenderness.  Musculoskeletal:        General: No swelling.     Cervical back: Neck supple.     Right lower leg: Edema present.  Skin:    General: Skin is warm and dry.     Capillary Refill: Capillary refill takes less than 2 seconds.  Neurological:     Mental Status: He is alert. He is disoriented.  Psychiatric:        Mood and Affect: Mood normal.     ED Results and Treatments Labs (all labs ordered are listed, but only abnormal results are displayed) Labs Reviewed  COMPREHENSIVE METABOLIC PANEL - Abnormal; Notable for the following components:      Result Value   Sodium 132 (*)    Chloride 92 (*)    Glucose, Bld 153 (*)    BUN 69 (*)    Creatinine, Ser 9.94 (*)    Calcium 7.8 (*)    GFR, Estimated 5 (*)    Anion gap 18 (*)    All other components within normal limits  CBC WITH DIFFERENTIAL/PLATELET - Abnormal; Notable for the following components:   RDW 15.9 (*)    All other components within normal limits  BLOOD GAS, VENOUS - Abnormal; Notable for the following components:   Acid-base deficit 4.3 (*)    All other components within normal limits  BRAIN NATRIURETIC PEPTIDE - Abnormal; Notable for the following components:   B Natriuretic Peptide 119.0 (*)  All other components within normal limits  LACTIC ACID, PLASMA - Abnormal; Notable for the following components:   Lactic Acid, Venous 2.6 (*)    All other components within normal limits  LACTIC ACID, PLASMA - Abnormal; Notable for the following components:   Lactic Acid, Venous 2.5 (*)    All other components within normal limits  CBG  MONITORING, ED - Abnormal; Notable for the following components:   Glucose-Capillary 155 (*)    All other components within normal limits  TROPONIN I (HIGH SENSITIVITY) - Abnormal; Notable for the following components:   Troponin I (High Sensitivity) 20 (*)    All other components within normal limits  CULTURE, BLOOD (ROUTINE X 2)  CULTURE, BLOOD (ROUTINE X 2)  ETHANOL  LIPASE, BLOOD  RAPID URINE DRUG SCREEN, HOSP PERFORMED  URINALYSIS, ROUTINE W REFLEX MICROSCOPIC  TROPONIN I (HIGH SENSITIVITY)                                                                                                                          Radiology CT CHEST ABDOMEN PELVIS WO CONTRAST  Result Date: 01/14/2022 CLINICAL DATA:  Nephrolithiasis with severe azotemia. Abnormal chest x-ray with perihilar opacities and low inspiration. EXAM: CT CHEST, ABDOMEN AND PELVIS WITHOUT CONTRAST TECHNIQUE: Multidetector CT imaging of the chest, abdomen and pelvis was performed following the standard protocol without IV contrast. RADIATION DOSE REDUCTION: This exam was performed according to the departmental dose-optimization program which includes automated exposure control, adjustment of the mA and/or kV according to patient size and/or use of iterative reconstruction technique. COMPARISON:  Portable chest today, PA Lat chest 09/01/2012, and chest CT with contrast 03/09/2013 FINDINGS: CT CHEST FINDINGS Cardiovascular: Mild-to-moderate cardiomegaly is noted new from 2014. There are scattered three-vessel coronary artery calcifications. There is mild aortic atherosclerosis without aneurysm. Pulmonary veins and arteries are normal caliber. Mediastinum/Nodes: There are scattered shotty subcentimeter mediastinal lymph nodes, unchanged since 2014. No new or enlarging adenopathy is seen. Axillary spaces are clear. The lower poles of the thyroid gland, esophageal wall, and thoracic trachea unremarkable. Lungs/Pleura: There is no pleural effusion,  thickening or pneumothorax. There is mild posterior atelectasis in the lower lobes. Mild bronchial thickening is noted in both lower lobes without evidence of bronchopneumonia, confluent consolidation or mass. There are calcified granulomas in the upper lobes. There is scattered mild subpleural reticulation in the bases. Musculoskeletal: There are degenerative changes of the thoracic spine. There are no acute or significant osseous findings. No significant chest wall findings. Dendritic gynecomastia is again noted. CT ABDOMEN PELVIS FINDINGS Hepatobiliary: Enlarged liver, 24 cm length with mild-to-moderate steatosis. No focal liver masses seen without contrast. Gallbladder is mildly dilated, 12 cm in length. There is mild respiratory motion through the gallbladder but some images may suggest faint pericholecystic edema. There is no calcified stone, but early cholecystitis is not excluded. Pancreas: There is diffuse fatty atrophy of the pancreas especially pronounced in the head, uncinate process and neck segments. No focal abnormality  or inflammatory change is seen. Spleen: Mildly enlarged, 15.5 cm length, without contrast otherwise unremarkable. Adrenals/Urinary Tract: There is no adrenal mass or focal abnormality of the unenhanced renal cortex. No significant cortical thinning is seen. There is trace symmetric perinephric stranding. There are a few bilateral punctate nonobstructive caliceal stones but there is no obstructing stone or hydronephrosis. Both ureters are decompressed and clear. The bladder is contracted and not well seen but could be thickened. Stomach/Bowel: Mildly fluid distended stomach without wall thickening. The unopacified small and large bowel demonstrate no dilatation or wall thickening. The appendix is normal. There are uncomplicated colonic diverticula. Vascular/Lymphatic: Aortic atherosclerosis. No enlarged abdominal or pelvic lymph nodes. Reproductive: No prostatomegaly. Other: There is a  tiny umbilical fat hernia. There are small bilateral inguinal fat hernias. There is no free air, free hemorrhage or free fluid Musculoskeletal: There is a unilateral right posterior fusion rod with pedicle screws at L4, L5 and S1 and solid fusions in between. There is facet hypertrophy in the lower lumbar segments. No acute or significant osseous findings. IMPRESSION: 1. Cardiomegaly without findings of CHF. 2. Aortic and coronary artery atherosclerosis. 3. Bronchial thickening in the lower lobes without evidence of bronchopneumonia, with mild scattered subpleural reticulation in the lung bases. 4. Dendritic gynecomastia. 5. Some images may suggest pericholecystic edema but there is motion artifact through the area limiting evaluation of the gallbladder. There is gallbladder distention without calcified stones. The wall is not well seen. Ultrasound may be helpful if there is clinical uncertainty as to possible cholecystitis. 6. Mild hepatosplenomegaly and hepatic steatosis. 7. The unenhanced kidneys unremarkable except for nonobstructing micronephrolithiasis. 8. Cystitis versus bladder nondistention. 9. Diverticulosis without evidence of diverticulitis. 10. Umbilical and inguinal fat hernias and remaining findings described above. Electronically Signed   By: Telford Nab M.D.   On: 01/14/2022 02:25   CT Head Wo Contrast  Result Date: 01/14/2022 CLINICAL DATA:  Altered mental status.  Confusion. EXAM: CT HEAD WITHOUT CONTRAST TECHNIQUE: Contiguous axial images were obtained from the base of the skull through the vertex without intravenous contrast. RADIATION DOSE REDUCTION: This exam was performed according to the departmental dose-optimization program which includes automated exposure control, adjustment of the mA and/or kV according to patient size and/or use of iterative reconstruction technique. COMPARISON:  Remote head CT 06/08/2006 FINDINGS: Brain: No intracranial hemorrhage, mass effect, or midline shift.  No hydrocephalus. The basilar cisterns are patent. No evidence of territorial infarct or acute ischemia. There is a prominent perivascular space in the left subinsular region. No extra-axial or intracranial fluid collection. Vascular: No hyperdense vessel or unexpected calcification. Skull: No fracture or focal lesion. Sinuses/Orbits: Paranasal sinuses and mastoid air cells are clear. The visualized orbits are unremarkable. Other: None. IMPRESSION: No acute intracranial abnormality. Electronically Signed   By: Keith Rake M.D.   On: 01/14/2022 00:20   DG Chest Portable 1 View  Result Date: 01/14/2022 CLINICAL DATA:  Altered mental status. Concern for pneumonia versus edema. EXAM: PORTABLE CHEST 1 VIEW COMPARISON:  Remote radiograph 09/01/2012 FINDINGS: Lung volumes are low. In conjunction with soft tissue attenuation from habitus evaluation is limited. The heart is mildly enlarged but likely accentuated by AP technique. Patchy perihilar bilateral lung opacities. No large pneumothorax or pleural effusion. No acute osseous findings. IMPRESSION: Low lung volumes with patchy perihilar bilateral lung opacities that may represent pulmonary edema or multifocal pneumonia. Electronically Signed   By: Keith Rake M.D.   On: 01/14/2022 00:14    Pertinent labs & imaging  results that were available during my care of the patient were reviewed by me and considered in my medical decision making (see MDM for details).  Medications Ordered in ED Medications  ondansetron (ZOFRAN-ODT) disintegrating tablet 4 mg (4 mg Oral Not Given 01/13/22 2337)  norepinephrine (LEVOPHED) 38m in 2535m(0.016 mg/mL) premix infusion (3 mcg/min Intravenous Infusion Verify 01/14/22 0300)  lactated ringers infusion ( Intravenous New Bag/Given 01/14/22 0200)  lactated ringers bolus 500 mL (0 mLs Intravenous Stopped 01/14/22 0046)  ondansetron (ZOFRAN) injection 4 mg (4 mg Intravenous Given 01/13/22 2335)  lactated ringers bolus 500 mL (0 mLs  Intravenous Stopped 01/14/22 0202)                                                                                                                                     Procedures .Critical Care  Performed by: KoTeressa LowerMD Authorized by: KoTeressa LowerMD   Critical care provider statement:    Critical care time (minutes):  100   Critical care was time spent personally by me on the following activities:  Development of treatment plan with patient or surrogate, discussions with consultants, evaluation of patient's response to treatment, examination of patient, ordering and review of laboratory studies, ordering and review of radiographic studies, ordering and performing treatments and interventions, pulse oximetry, re-evaluation of patient's condition and review of old charts ARTERIAL LINE  Date/Time: 01/14/2022 3:00 AM  Performed by: KoTeressa LowerMD Authorized by: KoTeressa LowerMD   Consent:    Consent obtained:  Verbal   Consent given by:  Spouse   Risks discussed:  Bleeding, ischemia, repeat procedure, infection and pain Pre-procedure details:    Skin preparation:  Chlorhexidine   Preparation: Patient was prepped and draped in sterile fashion   Sedation:    Sedation type:  None Anesthesia:    Anesthesia method:  Local infiltration   Local anesthetic:  Lidocaine 1% w/o epi Procedure details:    Location:  R radial   Allen's test performed: yes     Allen's test abnormal: no     Placement technique:  Seldinger   Number of attempts:  1   Transducer: waveform confirmed   Post-procedure details:    Post-procedure:  Sterile dressing applied and sutured   Procedure completion:  Tolerated well, no immediate complications   (including critical care time)  Medical Decision Making / ED Course   This patient presents to the ED for concern of altered mental status, this involves an extensive number of treatment options, and is a complaint that carries with it a high risk  of complications and morbidity.  The differential diagnosis includes polypharmacy, renal failure, metabolic encephalopathy, toxic encephalopathy, electrolyte abnormality  MDM: Patient seen emergency room for evaluation of altered mental status.  Physical exam reveals an ill-appearing patient with bilateral lower extremity edema that is falling asleep mid exam.  Patient appears encephalopathic.  Laboratory evaluation with a  BUN of 69, creatinine 9.94 which is a significant elevation for this patient with his last baseline at 1.04.  Patient arrives hypotensive and given his EF of 35% we had to be very careful with aggressive fluid resuscitation and thus he received 2 boluses of 500 cc and is running maintenance fluids but unfortunately his pressures did not improve.  Patient then placed on Levophed.  Chest x-ray unremarkable, CT head and CT chest abdomen pelvis with no obstructive uropathy or acute disease that would explain his worsening renal failure.  I spoke with Dr. Duwayne Heck of critical care who recommends transfer to [facility] for likely need for possible CRRT.  I spoke with the nephrologist on-call Dr. Moshe Cipro who states that with adequate perfusion of the kidneys the patient may spontaneously improve and CRRT may not be necessary but this will require closer evaluation at [facility].  I personally placed an a line in the right radial artery as our manual blood pressures were fairly unreliable.  Patient then transferred ED to ED to [name] for PCCM and nephrology consultations and further management of his renal failure.   Additional history obtained: -Additional history obtained from sister -External records from outside source obtained and reviewed including: Chart review including previous notes, labs, imaging, consultation notes   Lab Tests: -I ordered, reviewed, and interpreted labs.   The pertinent results include:   Labs Reviewed  COMPREHENSIVE METABOLIC PANEL - Abnormal; Notable for  the following components:      Result Value   Sodium 132 (*)    Chloride 92 (*)    Glucose, Bld 153 (*)    BUN 69 (*)    Creatinine, Ser 9.94 (*)    Calcium 7.8 (*)    GFR, Estimated 5 (*)    Anion gap 18 (*)    All other components within normal limits  CBC WITH DIFFERENTIAL/PLATELET - Abnormal; Notable for the following components:   RDW 15.9 (*)    All other components within normal limits  BLOOD GAS, VENOUS - Abnormal; Notable for the following components:   Acid-base deficit 4.3 (*)    All other components within normal limits  BRAIN NATRIURETIC PEPTIDE - Abnormal; Notable for the following components:   B Natriuretic Peptide 119.0 (*)    All other components within normal limits  LACTIC ACID, PLASMA - Abnormal; Notable for the following components:   Lactic Acid, Venous 2.6 (*)    All other components within normal limits  LACTIC ACID, PLASMA - Abnormal; Notable for the following components:   Lactic Acid, Venous 2.5 (*)    All other components within normal limits  CBG MONITORING, ED - Abnormal; Notable for the following components:   Glucose-Capillary 155 (*)    All other components within normal limits  TROPONIN I (HIGH SENSITIVITY) - Abnormal; Notable for the following components:   Troponin I (High Sensitivity) 20 (*)    All other components within normal limits  CULTURE, BLOOD (ROUTINE X 2)  CULTURE, BLOOD (ROUTINE X 2)  ETHANOL  LIPASE, BLOOD  RAPID URINE DRUG SCREEN, HOSP PERFORMED  URINALYSIS, ROUTINE W REFLEX MICROSCOPIC  TROPONIN I (HIGH SENSITIVITY)      EKG   EKG Interpretation  Date/Time:    Ventricular Rate:    PR Interval:    QRS Duration:   QT Interval:    QTC Calculation:   R Axis:     Text Interpretation:           Imaging Studies ordered: I ordered  imaging studies including CT head, CT chest abdomen pelvis, chest x-ray I independently visualized and interpreted imaging. I agree with the radiologist interpretation   Medicines  ordered and prescription drug management: Meds ordered this encounter  Medications   lactated ringers bolus 500 mL   ondansetron (ZOFRAN-ODT) disintegrating tablet 4 mg   ondansetron (ZOFRAN) injection 4 mg   lactated ringers bolus 500 mL   norepinephrine (LEVOPHED) 4m in 2558m(0.016 mg/mL) premix infusion   lactated ringers infusion    -I have reviewed the patients home medicines and have made adjustments as needed  Critical interventions Fluid resuscitation, Levophed initiation, arterial line placement, multiple consultations  Consultations Obtained: I requested consultation with the nephrologist and critical care attending,  and discussed lab and imaging findings as well as pertinent plan - they recommend: Transfer to [facility] Cardiac Monitoring: The patient was maintained on a cardiac monitor.  I personally viewed and interpreted the cardiac monitored which showed an underlying rhythm of: A-fib  Social Determinants of Health:  Factors impacting patients care include: none   Reevaluation: After the interventions noted above, I reevaluated the patient and found that they have :improved  Co morbidities that complicate the patient evaluation  Past Medical History:  Diagnosis Date   Arthritis    Broken leg 02/07/2019   Depression    Diabetes mellitus [place]   Hypertension       Dispostion: I considered admission for this patient, and given new renal failure, patient require hospital admission     Final Clinical Impression(s) / ED Diagnoses Final diagnoses:  None     '@PCDICTATION' @    KoTeressa LowerMD 01/14/22 [phone]

## 2022-01-14 ENCOUNTER — Inpatient Hospital Stay (HOSPITAL_COMMUNITY): Payer: Medicare Other

## 2022-01-14 ENCOUNTER — Emergency Department (HOSPITAL_COMMUNITY): Payer: Medicare Other

## 2022-01-14 DIAGNOSIS — G4089 Other seizures: Secondary | ICD-10-CM | POA: Diagnosis not present

## 2022-01-14 DIAGNOSIS — N179 Acute kidney failure, unspecified: Secondary | ICD-10-CM | POA: Diagnosis not present

## 2022-01-14 DIAGNOSIS — R579 Shock, unspecified: Secondary | ICD-10-CM

## 2022-01-14 DIAGNOSIS — I5020 Unspecified systolic (congestive) heart failure: Secondary | ICD-10-CM

## 2022-01-14 DIAGNOSIS — G9340 Encephalopathy, unspecified: Secondary | ICD-10-CM | POA: Diagnosis not present

## 2022-01-14 DIAGNOSIS — R410 Disorientation, unspecified: Secondary | ICD-10-CM | POA: Diagnosis present

## 2022-01-14 DIAGNOSIS — I130 Hypertensive heart and chronic kidney disease with heart failure and stage 1 through stage 4 chronic kidney disease, or unspecified chronic kidney disease: Secondary | ICD-10-CM | POA: Diagnosis present

## 2022-01-14 DIAGNOSIS — D649 Anemia, unspecified: Secondary | ICD-10-CM | POA: Diagnosis present

## 2022-01-14 DIAGNOSIS — N170 Acute kidney failure with tubular necrosis: Secondary | ICD-10-CM | POA: Diagnosis present

## 2022-01-14 DIAGNOSIS — F32A Depression, unspecified: Secondary | ICD-10-CM | POA: Diagnosis present

## 2022-01-14 DIAGNOSIS — Z6841 Body Mass Index (BMI) 40.0 and over, adult: Secondary | ICD-10-CM | POA: Diagnosis not present

## 2022-01-14 DIAGNOSIS — J9602 Acute respiratory failure with hypercapnia: Secondary | ICD-10-CM

## 2022-01-14 DIAGNOSIS — I4891 Unspecified atrial fibrillation: Secondary | ICD-10-CM | POA: Diagnosis not present

## 2022-01-14 DIAGNOSIS — E1122 Type 2 diabetes mellitus with diabetic chronic kidney disease: Secondary | ICD-10-CM | POA: Diagnosis present

## 2022-01-14 DIAGNOSIS — E874 Mixed disorder of acid-base balance: Secondary | ICD-10-CM | POA: Diagnosis present

## 2022-01-14 DIAGNOSIS — J9601 Acute respiratory failure with hypoxia: Secondary | ICD-10-CM | POA: Diagnosis present

## 2022-01-14 DIAGNOSIS — I5043 Acute on chronic combined systolic (congestive) and diastolic (congestive) heart failure: Secondary | ICD-10-CM | POA: Diagnosis not present

## 2022-01-14 DIAGNOSIS — J810 Acute pulmonary edema: Secondary | ICD-10-CM | POA: Diagnosis not present

## 2022-01-14 DIAGNOSIS — R569 Unspecified convulsions: Secondary | ICD-10-CM

## 2022-01-14 DIAGNOSIS — K760 Fatty (change of) liver, not elsewhere classified: Secondary | ICD-10-CM | POA: Diagnosis present

## 2022-01-14 DIAGNOSIS — I4821 Permanent atrial fibrillation: Secondary | ICD-10-CM | POA: Diagnosis present

## 2022-01-14 DIAGNOSIS — Z20822 Contact with and (suspected) exposure to covid-19: Secondary | ICD-10-CM | POA: Diagnosis present

## 2022-01-14 DIAGNOSIS — G928 Other toxic encephalopathy: Secondary | ICD-10-CM | POA: Diagnosis present

## 2022-01-14 DIAGNOSIS — E1140 Type 2 diabetes mellitus with diabetic neuropathy, unspecified: Secondary | ICD-10-CM | POA: Diagnosis present

## 2022-01-14 DIAGNOSIS — R578 Other shock: Secondary | ICD-10-CM | POA: Diagnosis present

## 2022-01-14 DIAGNOSIS — E861 Hypovolemia: Secondary | ICD-10-CM | POA: Diagnosis present

## 2022-01-14 DIAGNOSIS — I428 Other cardiomyopathies: Secondary | ICD-10-CM | POA: Diagnosis present

## 2022-01-14 DIAGNOSIS — R339 Retention of urine, unspecified: Secondary | ICD-10-CM | POA: Diagnosis not present

## 2022-01-14 DIAGNOSIS — D696 Thrombocytopenia, unspecified: Secondary | ICD-10-CM | POA: Diagnosis present

## 2022-01-14 DIAGNOSIS — I2510 Atherosclerotic heart disease of native coronary artery without angina pectoris: Secondary | ICD-10-CM | POA: Diagnosis present

## 2022-01-14 DIAGNOSIS — I959 Hypotension, unspecified: Secondary | ICD-10-CM

## 2022-01-14 DIAGNOSIS — R4182 Altered mental status, unspecified: Secondary | ICD-10-CM | POA: Diagnosis not present

## 2022-01-14 DIAGNOSIS — I5023 Acute on chronic systolic (congestive) heart failure: Secondary | ICD-10-CM | POA: Diagnosis not present

## 2022-01-14 DIAGNOSIS — E871 Hypo-osmolality and hyponatremia: Secondary | ICD-10-CM | POA: Diagnosis present

## 2022-01-14 LAB — POCT I-STAT 7, (LYTES, BLD GAS, ICA,H+H)
Acid-base deficit: 5 mmol/L — ABNORMAL HIGH (ref 0.0–2.0)
Acid-base deficit: 7 mmol/L — ABNORMAL HIGH (ref 0.0–2.0)
Acid-base deficit: 7 mmol/L — ABNORMAL HIGH (ref 0.0–2.0)
Bicarbonate: 22.8 mmol/L (ref 20.0–28.0)
Bicarbonate: 20.4 mmol/L (ref 20.0–28.0)
Bicarbonate: 18.3 mmol/L — ABNORMAL LOW (ref 20.0–28.0)
Calcium, Ion: 0.97 mmol/L — ABNORMAL LOW (ref 1.15–1.40)
Calcium, Ion: 0.92 mmol/L — ABNORMAL LOW (ref 1.15–1.40)
Calcium, Ion: 0.94 mmol/L — ABNORMAL LOW (ref 1.15–1.40)
HCT: 37 % — ABNORMAL LOW (ref 39.0–52.0)
HCT: 37 % — ABNORMAL LOW (ref 39.0–52.0)
HCT: 35 % — ABNORMAL LOW (ref 39.0–52.0)
Hemoglobin: 12.6 g/dL — ABNORMAL LOW (ref 13.0–17.0)
Hemoglobin: 12.6 g/dL — ABNORMAL LOW (ref 13.0–17.0)
Hemoglobin: 11.9 g/dL — ABNORMAL LOW (ref 13.0–17.0)
O2 Saturation: 97 %
O2 Saturation: 99 %
O2 Saturation: 96 %
Patient temperature: 36
Patient temperature: 98.5
Patient temperature: 37.3
Potassium: 4.6 mmol/L (ref 3.5–5.1)
Potassium: 4.2 mmol/L (ref 3.5–5.1)
Potassium: 4 mmol/L (ref 3.5–5.1)
Sodium: 131 mmol/L — ABNORMAL LOW (ref 135–145)
Sodium: 132 mmol/L — ABNORMAL LOW (ref 135–145)
Sodium: 131 mmol/L — ABNORMAL LOW (ref 135–145)
TCO2: 24 mmol/L (ref 22–32)
TCO2: 22 mmol/L (ref 22–32)
TCO2: 19 mmol/L — ABNORMAL LOW (ref 22–32)
pCO2 arterial: 51.3 mmHg — ABNORMAL HIGH (ref 32–48)
pCO2 arterial: 48.7 mmHg — ABNORMAL HIGH (ref 32–48)
pCO2 arterial: 34 mmHg (ref 32–48)
pH, Arterial: 7.251 — ABNORMAL LOW (ref 7.35–7.45)
pH, Arterial: 7.229 — ABNORMAL LOW (ref 7.35–7.45)
pH, Arterial: 7.341 — ABNORMAL LOW (ref 7.35–7.45)
pO2, Arterial: 101 mmHg (ref 83–108)
pO2, Arterial: 156 mmHg — ABNORMAL HIGH (ref 83–108)
pO2, Arterial: 90 mmHg (ref 83–108)

## 2022-01-14 LAB — CBC WITH DIFFERENTIAL/PLATELET
Abs Immature Granulocytes: 0.04 10*3/uL (ref 0.00–0.07)
Basophils Absolute: 0 10*3/uL (ref 0.0–0.1)
Basophils Relative: 0 %
Eosinophils Absolute: 0.2 10*3/uL (ref 0.0–0.5)
Eosinophils Relative: 2 %
HCT: 37.9 % — ABNORMAL LOW (ref 39.0–52.0)
Hemoglobin: 12.3 g/dL — ABNORMAL LOW (ref 13.0–17.0)
Immature Granulocytes: 0 %
Lymphocytes Relative: 21 %
Lymphs Abs: 2.1 10*3/uL (ref 0.7–4.0)
MCH: 28.6 pg (ref 26.0–34.0)
MCHC: 32.5 g/dL (ref 30.0–36.0)
MCV: 88.1 fL (ref 80.0–100.0)
Monocytes Absolute: 0.9 10*3/uL (ref 0.1–1.0)
Monocytes Relative: 10 %
Neutro Abs: 6.4 10*3/uL (ref 1.7–7.7)
Neutrophils Relative %: 67 %
Platelets: 205 10*3/uL (ref 150–400)
RBC: 4.3 MIL/uL (ref 4.22–5.81)
RDW: 15.8 % — ABNORMAL HIGH (ref 11.5–15.5)
WBC: 9.6 10*3/uL (ref 4.0–10.5)
nRBC: 0 % (ref 0.0–0.2)

## 2022-01-14 LAB — COMPREHENSIVE METABOLIC PANEL
ALT: 15 U/L (ref 0–44)
ALT: 18 U/L (ref 0–44)
AST: 18 U/L (ref 15–41)
AST: 21 U/L (ref 15–41)
Albumin: 3.1 g/dL — ABNORMAL LOW (ref 3.5–5.0)
Albumin: 3.5 g/dL (ref 3.5–5.0)
Alkaline Phosphatase: 71 U/L (ref 38–126)
Alkaline Phosphatase: 83 U/L (ref 38–126)
Anion gap: 17 — ABNORMAL HIGH (ref 5–15)
Anion gap: 18 — ABNORMAL HIGH (ref 5–15)
BUN: 66 mg/dL — ABNORMAL HIGH (ref 8–23)
BUN: 69 mg/dL — ABNORMAL HIGH (ref 8–23)
CO2: 21 mmol/L — ABNORMAL LOW (ref 22–32)
CO2: 22 mmol/L (ref 22–32)
Calcium: 7.6 mg/dL — ABNORMAL LOW (ref 8.9–10.3)
Calcium: 7.8 mg/dL — ABNORMAL LOW (ref 8.9–10.3)
Chloride: 92 mmol/L — ABNORMAL LOW (ref 98–111)
Chloride: 94 mmol/L — ABNORMAL LOW (ref 98–111)
Creatinine, Ser: 10 mg/dL — ABNORMAL HIGH (ref 0.61–1.24)
Creatinine, Ser: 9.94 mg/dL — ABNORMAL HIGH (ref 0.61–1.24)
GFR, Estimated: 5 mL/min — ABNORMAL LOW (ref >60)
GFR, Estimated: 5 mL/min — ABNORMAL LOW (ref >60)
Glucose, Bld: 144 mg/dL — ABNORMAL HIGH (ref 70–99)
Glucose, Bld: 153 mg/dL — ABNORMAL HIGH (ref 70–99)
Potassium: 4.4 mmol/L (ref 3.5–5.1)
Potassium: 4.9 mmol/L (ref 3.5–5.1)
Sodium: 132 mmol/L — ABNORMAL LOW (ref 135–145)
Sodium: 132 mmol/L — ABNORMAL LOW (ref 135–145)
Total Bilirubin: 0.6 mg/dL (ref 0.3–1.2)
Total Bilirubin: 0.7 mg/dL (ref 0.3–1.2)
Total Protein: 6.9 g/dL (ref 6.5–8.1)
Total Protein: 7.8 g/dL (ref 6.5–8.1)

## 2022-01-14 LAB — BLOOD GAS, ARTERIAL
Acid-base deficit: 7.4 mmol/L — ABNORMAL HIGH (ref 0.0–2.0)
Bicarbonate: 21.1 mmol/L (ref 20.0–28.0)
O2 Saturation: 95.1 %
Patient temperature: 36.8
pCO2 arterial: 54 mmHg — ABNORMAL HIGH (ref 32–48)
pH, Arterial: 7.2 — ABNORMAL LOW (ref 7.35–7.45)
pO2, Arterial: 98 mmHg (ref 83–108)

## 2022-01-14 LAB — BASIC METABOLIC PANEL
Anion gap: 17 — ABNORMAL HIGH (ref 5–15)
BUN: 67 mg/dL — ABNORMAL HIGH (ref 8–23)
CO2: 19 mmol/L — ABNORMAL LOW (ref 22–32)
Calcium: 7 mg/dL — ABNORMAL LOW (ref 8.9–10.3)
Chloride: 96 mmol/L — ABNORMAL LOW (ref 98–111)
Creatinine, Ser: 9.45 mg/dL — ABNORMAL HIGH (ref 0.61–1.24)
GFR, Estimated: 6 mL/min — ABNORMAL LOW (ref >60)
Glucose, Bld: 134 mg/dL — ABNORMAL HIGH (ref 70–99)
Potassium: 5 mmol/L (ref 3.5–5.1)
Sodium: 132 mmol/L — ABNORMAL LOW (ref 135–145)

## 2022-01-14 LAB — URINALYSIS, ROUTINE W REFLEX MICROSCOPIC
Bacteria, UA: NONE SEEN
Bilirubin Urine: NEGATIVE
Glucose, UA: 50 mg/dL — AB
Ketones, ur: NEGATIVE mg/dL
Leukocytes,Ua: NEGATIVE
Nitrite: NEGATIVE
Protein, ur: 100 mg/dL — AB
Specific Gravity, Urine: 1.016 (ref 1.005–1.030)
pH: 5 (ref 5.0–8.0)

## 2022-01-14 LAB — RAPID URINE DRUG SCREEN, HOSP PERFORMED
Amphetamines: NOT DETECTED
Barbiturates: NOT DETECTED
Benzodiazepines: NOT DETECTED
Cocaine: NOT DETECTED
Opiates: POSITIVE — AB
Tetrahydrocannabinol: NOT DETECTED

## 2022-01-14 LAB — BLOOD GAS, VENOUS
Acid-base deficit: 6.7 mmol/L — ABNORMAL HIGH (ref 0.0–2.0)
Bicarbonate: 22.4 mmol/L (ref 20.0–28.0)
Drawn by: 2774
O2 Saturation: 90.1 %
Patient temperature: 37
pCO2, Ven: 60 mmHg (ref 44–60)
pH, Ven: 7.18 — CL (ref 7.25–7.43)
pO2, Ven: 64 mmHg — ABNORMAL HIGH (ref 32–45)

## 2022-01-14 LAB — TROPONIN I (HIGH SENSITIVITY)
Troponin I (High Sensitivity): 15 ng/L (ref <18)
Troponin I (High Sensitivity): 20 ng/L — ABNORMAL HIGH (ref <18)

## 2022-01-14 LAB — ECHOCARDIOGRAM COMPLETE
Height: 73 in
S' Lateral: 4 cm
Weight: 5844.84 oz

## 2022-01-14 LAB — GLUCOSE, CAPILLARY
Glucose-Capillary: 146 mg/dL — ABNORMAL HIGH (ref 70–99)
Glucose-Capillary: 147 mg/dL — ABNORMAL HIGH (ref 70–99)
Glucose-Capillary: 137 mg/dL — ABNORMAL HIGH (ref 70–99)
Glucose-Capillary: 141 mg/dL — ABNORMAL HIGH (ref 70–99)
Glucose-Capillary: 159 mg/dL — ABNORMAL HIGH (ref 70–99)
Glucose-Capillary: 120 mg/dL — ABNORMAL HIGH (ref 70–99)

## 2022-01-14 LAB — PROTIME-INR
INR: 1.4 — ABNORMAL HIGH (ref 0.8–1.2)
Prothrombin Time: 17.1 seconds — ABNORMAL HIGH (ref 11.4–15.2)

## 2022-01-14 LAB — FRACTIONAL EXCRET-NA(24HR UR +SERUM)
Creatinine, Ser: 9.97 mg/dL — ABNORMAL HIGH (ref 0.61–1.24)
Creatinine, Urine: 209 mg/dL
Fractional Excretion of Na: 0 %
Sodium, Ur: 13 mmol/L
Sodium: 132 mmol/L — ABNORMAL LOW (ref 135–145)

## 2022-01-14 LAB — BRAIN NATRIURETIC PEPTIDE: B Natriuretic Peptide: 119 pg/mL — ABNORMAL HIGH (ref 0.0–100.0)

## 2022-01-14 LAB — LACTIC ACID, PLASMA
Lactic Acid, Venous: 2.5 mmol/L (ref 0.5–1.9)
Lactic Acid, Venous: 2.6 mmol/L (ref 0.5–1.9)

## 2022-01-14 LAB — HEMOGLOBIN A1C
Hgb A1c MFr Bld: 9.9 % — ABNORMAL HIGH (ref 4.8–5.6)
Mean Plasma Glucose: 237.43 mg/dL

## 2022-01-14 LAB — HEPARIN LEVEL (UNFRACTIONATED): Heparin Unfractionated: >1.1 IU/mL — ABNORMAL HIGH (ref 0.30–0.70)

## 2022-01-14 LAB — MRSA NEXT GEN BY PCR, NASAL: MRSA by PCR Next Gen: NOT DETECTED

## 2022-01-14 LAB — SARS CORONAVIRUS 2 BY RT PCR: SARS Coronavirus 2 by RT PCR: NEGATIVE

## 2022-01-14 LAB — MAGNESIUM: Magnesium: 2.3 mg/dL (ref 1.7–2.4)

## 2022-01-14 LAB — TSH: TSH: 3.762 u[IU]/mL (ref 0.350–4.500)

## 2022-01-14 LAB — APTT: aPTT: 29 seconds (ref 24–36)

## 2022-01-14 LAB — HIV ANTIBODY (ROUTINE TESTING W REFLEX): HIV Screen 4th Generation wRfx: NONREACTIVE

## 2022-01-14 LAB — PROCALCITONIN: Procalcitonin: 0.38 ng/mL

## 2022-01-14 MED ORDER — FENTANYL CITRATE PF 50 MCG/ML IJ SOSY
50.0000 ug | PREFILLED_SYRINGE | Freq: Once | INTRAMUSCULAR | Status: AC
  Administered 2022-01-14: 50 ug via INTRAVENOUS

## 2022-01-14 MED ORDER — CALCIUM GLUCONATE-NACL 1-0.675 GM/50ML-% IV SOLN
1.0000 g | Freq: Once | INTRAVENOUS | Status: AC
  Administered 2022-01-14: 1000 mg via INTRAVENOUS
  Filled 2022-01-14 (×2): qty 50

## 2022-01-14 MED ORDER — HEPARIN (PORCINE) 25000 UT/250ML-% IV SOLN
1800.0000 [IU]/h | INTRAVENOUS | Status: DC
  Administered 2022-01-14 (×2): 1800 [IU]/h via INTRAVENOUS
  Administered 2022-01-15: 2000 [IU]/h via INTRAVENOUS
  Administered 2022-01-15: 2150 [IU]/h via INTRAVENOUS
  Administered 2022-01-16: 1800 [IU]/h via INTRAVENOUS
  Filled 2022-01-14 (×5): qty 250

## 2022-01-14 MED ORDER — FENTANYL CITRATE PF 50 MCG/ML IJ SOSY
PREFILLED_SYRINGE | INTRAMUSCULAR | Status: AC
  Administered 2022-01-14: 50 ug via INTRAVENOUS
  Filled 2022-01-14: qty 1

## 2022-01-14 MED ORDER — NOREPINEPHRINE 4 MG/250ML-% IV SOLN
2.0000 ug/min | INTRAVENOUS | Status: DC
  Administered 2022-01-14 (×2): 10 ug/min via INTRAVENOUS
  Administered 2022-01-15: 5 ug/min via INTRAVENOUS
  Administered 2022-01-15: 4 ug/min via INTRAVENOUS
  Filled 2022-01-14 (×4): qty 250

## 2022-01-14 MED ORDER — SODIUM CHLORIDE 0.9 % IV SOLN
100.0000 mg | Freq: Two times a day (BID) | INTRAVENOUS | Status: DC
  Administered 2022-01-14: 100 mg via INTRAVENOUS
  Filled 2022-01-14: qty 100

## 2022-01-14 MED ORDER — VALPROATE SODIUM 100 MG/ML IV SOLN
800.0000 mg | Freq: Three times a day (TID) | INTRAVENOUS | Status: DC
  Administered 2022-01-15 – 2022-01-16 (×4): 800 mg via INTRAVENOUS
  Filled 2022-01-14 (×6): qty 8

## 2022-01-14 MED ORDER — SODIUM CHLORIDE 0.9 % IV SOLN
250.0000 mL | INTRAVENOUS | Status: DC
  Administered 2022-01-14: 500 mL via INTRAVENOUS
  Administered 2022-01-14: 10 mL via INTRAVENOUS
  Administered 2022-01-15 – 2022-01-19 (×2): 250 mL via INTRAVENOUS

## 2022-01-14 MED ORDER — MIDAZOLAM HCL 2 MG/2ML IJ SOLN
INTRAMUSCULAR | Status: AC
  Administered 2022-01-14: 2 mg
  Filled 2022-01-14: qty 2

## 2022-01-14 MED ORDER — ORAL CARE MOUTH RINSE
15.0000 mL | OROMUCOSAL | Status: DC
Start: 2022-01-14 — End: 2022-01-15
  Administered 2022-01-14 – 2022-01-15 (×7): 15 mL via OROMUCOSAL

## 2022-01-14 MED ORDER — SODIUM CHLORIDE 0.45 % IV SOLN: INTRAVENOUS | Status: DC

## 2022-01-14 MED ORDER — LACTATED RINGERS IV BOLUS
500.0000 mL | Freq: Once | INTRAVENOUS | Status: AC
  Administered 2022-01-14: 500 mL via INTRAVENOUS

## 2022-01-14 MED ORDER — MIDAZOLAM HCL 2 MG/2ML IJ SOLN
2.0000 mg | INTRAMUSCULAR | Status: DC | PRN
  Administered 2022-01-14 – 2022-01-15 (×2): 2 mg via INTRAVENOUS
  Filled 2022-01-14 (×2): qty 2

## 2022-01-14 MED ORDER — PRISMASOL BGK 4/2.5 32-4-2.5 MEQ/L REPLACEMENT SOLN
Status: DC
  Filled 2022-01-14 (×5): qty 5000

## 2022-01-14 MED ORDER — FENTANYL 2500MCG IN NS 250ML (10MCG/ML) PREMIX INFUSION
50.0000 ug/h | INTRAVENOUS | Status: DC
  Administered 2022-01-14: 200 ug/h via INTRAVENOUS
  Administered 2022-01-14: 50 ug/h via INTRAVENOUS
  Administered 2022-01-15 (×2): 200 ug/h via INTRAVENOUS
  Filled 2022-01-14 (×3): qty 250

## 2022-01-14 MED ORDER — RACEPINEPHRINE HCL 2.25 % IN NEBU
INHALATION_SOLUTION | RESPIRATORY_TRACT | Status: AC
  Filled 2022-01-14: qty 0.5

## 2022-01-14 MED ORDER — NOREPINEPHRINE 4 MG/250ML-% IV SOLN
0.0000 ug/min | INTRAVENOUS | Status: DC
  Administered 2022-01-14: 2 ug/min via INTRAVENOUS
  Filled 2022-01-14: qty 250

## 2022-01-14 MED ORDER — ONDANSETRON HCL 4 MG/2ML IJ SOLN: 4.0000 mg | Freq: Four times a day (QID) | INTRAMUSCULAR | Status: DC | PRN

## 2022-01-14 MED ORDER — FENTANYL BOLUS VIA INFUSION
50.0000 ug | INTRAVENOUS | Status: DC | PRN
  Administered 2022-01-14 (×2): 50 ug via INTRAVENOUS
  Administered 2022-01-15 (×3): 100 ug via INTRAVENOUS

## 2022-01-14 MED ORDER — ORAL CARE MOUTH RINSE: 15.0000 mL | OROMUCOSAL | Status: DC | PRN

## 2022-01-14 MED ORDER — PRISMASOL BGK 4/2.5 32-4-2.5 MEQ/L EC SOLN
Status: DC
  Filled 2022-01-14 (×19): qty 5000

## 2022-01-14 MED ORDER — DOCUSATE SODIUM 50 MG/5ML PO LIQD
100.0000 mg | Freq: Two times a day (BID) | ORAL | Status: DC
Start: 2022-01-14 — End: 2022-01-17
  Administered 2022-01-14 – 2022-01-17 (×6): 100 mg
  Filled 2022-01-14 (×6): qty 10

## 2022-01-14 MED ORDER — LORAZEPAM 2 MG/ML IJ SOLN
INTRAMUSCULAR | Status: AC
  Administered 2022-01-14: 2 mg via INTRAVENOUS
  Filled 2022-01-14: qty 1

## 2022-01-14 MED ORDER — ETOMIDATE 2 MG/ML IV SOLN
INTRAVENOUS | Status: AC
  Administered 2022-01-14: 20 mg
  Filled 2022-01-14: qty 10

## 2022-01-14 MED ORDER — FENTANYL CITRATE PF 50 MCG/ML IJ SOSY
50.0000 ug | PREFILLED_SYRINGE | Freq: Once | INTRAMUSCULAR | Status: AC
  Filled 2022-01-14: qty 1

## 2022-01-14 MED ORDER — LACTATED RINGERS IV BOLUS
500.0000 mL | Freq: Once | INTRAVENOUS | Status: AC
Start: 2022-01-14 — End: 2022-01-14
  Administered 2022-01-14: 500 mL via INTRAVENOUS

## 2022-01-14 MED ORDER — PROPOFOL 1000 MG/100ML IV EMUL: 0.0000 ug/kg/min | INTRAVENOUS | Status: DC

## 2022-01-14 MED ORDER — HEPARIN (PORCINE) 2000 UNITS/L FOR CRRT: INTRAVENOUS_CENTRAL | Status: DC | PRN

## 2022-01-14 MED ORDER — PERFLUTREN LIPID MICROSPHERE
1.0000 mL | INTRAVENOUS | Status: AC | PRN
  Administered 2022-01-14: 2 mL via INTRAVENOUS

## 2022-01-14 MED ORDER — INSULIN ASPART 100 UNIT/ML IJ SOLN
0.0000 [IU] | INTRAMUSCULAR | Status: DC
  Administered 2022-01-14: 4 [IU] via SUBCUTANEOUS
  Administered 2022-01-14 – 2022-01-16 (×7): 3 [IU] via SUBCUTANEOUS
  Administered 2022-01-16: 4 [IU] via SUBCUTANEOUS
  Administered 2022-01-16: 3 [IU] via SUBCUTANEOUS
  Administered 2022-01-16 – 2022-01-17 (×5): 4 [IU] via SUBCUTANEOUS
  Administered 2022-01-17: 3 [IU] via SUBCUTANEOUS
  Administered 2022-01-17 – 2022-01-18 (×7): 4 [IU] via SUBCUTANEOUS
  Administered 2022-01-18 (×2): 7 [IU] via SUBCUTANEOUS
  Administered 2022-01-18 – 2022-01-19 (×3): 4 [IU] via SUBCUTANEOUS
  Administered 2022-01-19: 7 [IU] via SUBCUTANEOUS
  Administered 2022-01-19 (×2): 4 [IU] via SUBCUTANEOUS
  Administered 2022-01-20 (×4): 7 [IU] via SUBCUTANEOUS
  Administered 2022-01-21 (×5): 4 [IU] via SUBCUTANEOUS
  Administered 2022-01-21: 7 [IU] via SUBCUTANEOUS
  Administered 2022-01-22 (×2): 3 [IU] via SUBCUTANEOUS
  Administered 2022-01-22 (×3): 4 [IU] via SUBCUTANEOUS
  Administered 2022-01-22: 3 [IU] via SUBCUTANEOUS
  Administered 2022-01-23 (×2): 4 [IU] via SUBCUTANEOUS
  Administered 2022-01-23: 3 [IU] via SUBCUTANEOUS
  Administered 2022-01-23 (×2): 4 [IU] via SUBCUTANEOUS
  Administered 2022-01-23: 7 [IU] via SUBCUTANEOUS
  Administered 2022-01-23: 3 [IU] via SUBCUTANEOUS
  Administered 2022-01-24 (×3): 4 [IU] via SUBCUTANEOUS
  Administered 2022-01-24: 7 [IU] via SUBCUTANEOUS
  Administered 2022-01-25: 4 [IU] via SUBCUTANEOUS
  Administered 2022-01-25: 7 [IU] via SUBCUTANEOUS
  Administered 2022-01-25 (×4): 4 [IU] via SUBCUTANEOUS
  Administered 2022-01-26: 3 [IU] via SUBCUTANEOUS
  Administered 2022-01-26: 4 [IU] via SUBCUTANEOUS

## 2022-01-14 MED ORDER — LORAZEPAM 2 MG/ML IJ SOLN
2.0000 mg | Freq: Once | INTRAMUSCULAR | Status: AC
Start: 2022-01-14 — End: 2022-01-14

## 2022-01-14 MED ORDER — POLYETHYLENE GLYCOL 3350 17 G PO PACK: 17.0000 g | PACK | Freq: Every day | ORAL | Status: DC | PRN

## 2022-01-14 MED ORDER — HEPARIN SODIUM (PORCINE) 1000 UNIT/ML DIALYSIS
1000.0000 [IU] | INTRAMUSCULAR | Status: DC | PRN
  Administered 2022-01-14: 2400 [IU] via INTRAVENOUS_CENTRAL
  Administered 2022-01-15 (×2): 1200 [IU] via INTRAVENOUS_CENTRAL
  Filled 2022-01-14: qty 4
  Filled 2022-01-14: qty 3
  Filled 2022-01-14 (×3): qty 6

## 2022-01-14 MED ORDER — ALBUTEROL SULFATE (2.5 MG/3ML) 0.083% IN NEBU
2.5000 mg | INHALATION_SOLUTION | RESPIRATORY_TRACT | Status: DC | PRN
  Administered 2022-01-14: 2.5 mg via RESPIRATORY_TRACT

## 2022-01-14 MED ORDER — VALPROATE SODIUM 100 MG/ML IV SOLN
3000.0000 mg | Freq: Once | INTRAVENOUS | Status: AC
  Administered 2022-01-14: 3000 mg via INTRAVENOUS
  Filled 2022-01-14: qty 30

## 2022-01-14 MED ORDER — PANTOPRAZOLE SODIUM 40 MG IV SOLR
40.0000 mg | INTRAVENOUS | Status: DC
  Administered 2022-01-14: 40 mg via INTRAVENOUS
  Filled 2022-01-14: qty 10

## 2022-01-14 MED ORDER — POLYETHYLENE GLYCOL 3350 17 G PO PACK
17.0000 g | PACK | Freq: Every day | ORAL | Status: DC
  Administered 2022-01-15 – 2022-01-17 (×3): 17 g
  Filled 2022-01-14 (×3): qty 1

## 2022-01-14 MED ORDER — STERILE WATER FOR INJECTION IV SOLN
INTRAVENOUS | Status: DC
  Filled 2022-01-14: qty 1000

## 2022-01-14 MED ORDER — ALBUTEROL SULFATE (2.5 MG/3ML) 0.083% IN NEBU
INHALATION_SOLUTION | RESPIRATORY_TRACT | Status: AC
  Filled 2022-01-14: qty 3

## 2022-01-14 MED ORDER — DOCUSATE SODIUM 100 MG PO CAPS: 100.0000 mg | ORAL_CAPSULE | Freq: Two times a day (BID) | ORAL | Status: DC | PRN

## 2022-01-14 MED ORDER — CHLORHEXIDINE GLUCONATE CLOTH 2 % EX PADS
6.0000 | MEDICATED_PAD | Freq: Every day | CUTANEOUS | Status: DC
  Administered 2022-01-14 – 2022-01-25 (×12): 6 via TOPICAL

## 2022-01-14 MED ORDER — STERILE WATER FOR INJECTION IV SOLN
INTRAVENOUS | Status: DC
  Filled 2022-01-14 (×2): qty 150

## 2022-01-14 MED ORDER — LACTATED RINGERS IV SOLN: INTRAVENOUS | Status: DC

## 2022-01-14 MED ORDER — SODIUM CHLORIDE 0.9 % IV SOLN
1.0000 g | INTRAVENOUS | Status: DC
  Administered 2022-01-14 – 2022-01-16 (×3): 1 g via INTRAVENOUS
  Filled 2022-01-14 (×3): qty 10

## 2022-01-14 MED ORDER — ROCURONIUM BROMIDE 10 MG/ML (PF) SYRINGE
PREFILLED_SYRINGE | INTRAVENOUS | Status: AC
  Administered 2022-01-14: 50 mg
  Filled 2022-01-14: qty 10

## 2022-01-14 MED ORDER — PANTOPRAZOLE 2 MG/ML SUSPENSION
40.0000 mg | Freq: Every day | ORAL | Status: DC
  Administered 2022-01-15 – 2022-01-17 (×3): 40 mg
  Filled 2022-01-14 (×4): qty 20

## 2022-01-14 NOTE — ED Notes (Signed)
Dr. Posey Rea made aware that pts BP is trending down.

## 2022-01-14 NOTE — Progress Notes (Signed)
   01/14/22 1100  Provider Notification  Provider Name/Title Adron Bene  Date Provider Notified 01/14/22  Time Provider Notified 1102  Method of Notification Face-to-face  Notification Reason Other (Comment) (Hypotensive despite levophed infusion at max for PIV infusion, UO not improving with bicarb gtt)  Provider response No new orders

## 2022-01-14 NOTE — Progress Notes (Addendum)
NAME:  Taylor [name], MRN:  [id number], DOB:  1956/04/25, LOS: 0 ADMISSION DATE:  01/13/2022, CONSULTATION DATE:  01/14/22 REFERRING MD:  [name] , CHIEF COMPLAINT:  AMS    History of Present Illness:  66 yo man with HTN, CHF ef 35-40%, HLD, afib on eliquis, DM2, obesity, back pain (on ambien, oxycodone, dilaudid, lyrica.  Presenting with AMS, new renal failure.   Recently returned from [facility]. Since then has been excessively sleepy and fatigued with associated nausea. This is rapid change from [place]. Last took home meds yesterday 5pm. Denies pain. Arousable, only answers simple questions.  Falls asleep.    Transferred to [name] ED (no ICU beds).    In ed found to have B LE edema.   Hypotensive. Given LR 500cc  x 2.  Levophed.   Pertinent  Medical History   Past Medical History:  Diagnosis Date   Arthritis    Broken leg 02/07/2019   Depression    Diabetes mellitus without complication ([place])    Hypertension    No current facility-administered medications on file prior to encounter.   Current Outpatient Medications on File Prior to Encounter  Medication Sig Dispense Refill   apixaban (ELIQUIS) 5 MG TABS tablet TAKE 1 TABLET BY MOUTH TWICE DAILY 60 tablet 5   dapagliflozin propanediol (FARXIGA) 10 MG TABS tablet Take 1 tablet (10 mg total) by mouth daily before breakfast. 21 tablet 0   glipiZIDE (GLUCOTROL XL) 10 MG 24 hr tablet TAKE 1 TABLET BY MOUTH ONCE DAILY. 90 tablet 4   glucose blood (ONETOUCH ULTRA) test strip Use to check sugar daily for type 2 diabetes E11.9 100 each 4   HYDROmorphone (DILAUDID) 4 MG tablet Take 4 mg by mouth in the morning, at noon, in the evening, and at bedtime. Must be name brand     lisinopril (ZESTRIL) 10 MG tablet TAKE 1 TABLET BY MOUTH ONCE DAILY. 90 tablet 0   metFORMIN (GLUCOPHAGE) 1000 MG tablet TAKE 1 TABLET BY MOUTH TWICE DAILY 180 tablet 4   metoprolol (TOPROL XL) 200 MG 24 hr tablet Take 1 tablet (200 mg total) by mouth daily. 30 tablet 5    MOVANTIK 25 MG TABS tablet      NARCAN 4 MG/0.1ML LIQD nasal spray kit Place 0.4 mg into the nose as needed (accidental overdose).     OXYCONTIN 20 MG 12 hr tablet Take 20 mg by mouth in the morning and at bedtime. Take 20 mg by mouth four times a day     pioglitazone (ACTOS) 45 MG tablet TAKE 1 TABLET BY MOUTH ONCE DAILY. 90 tablet 4   potassium chloride SA (KLOR-CON) 20 MEQ tablet Take 1 tablet (20 mEq total) by mouth daily. 90 tablet 1   pregabalin (LYRICA) 100 MG capsule Take 100 mg by mouth 3 (three) times daily.     simvastatin (ZOCOR) 40 MG tablet TAKE 1 TABLET BY MOUTH ONCE DAILY. 90 tablet 0   torsemide (DEMADEX) 20 MG tablet Take 40 MG (2 tabs) every AM, and Take 20 MG (1 tab) every PM. 90 tablet 3   vitamin B-12 (CYANOCOBALAMIN) 1000 MCG tablet Take 1,000 mcg by mouth daily.     zolpidem (AMBIEN CR) 12.5 MG CR tablet Take 12.5 mg by mouth at bedtime.        Significant Hospital Events: Including procedures, antibiotic start and stop dates in addition to other pertinent events   8/9 admitted to ICU  Interim History / Subjective:  Somnolent. Awakens  to voice, follows commands.  Objective   Blood pressure (!) 89/60, pulse 78, temperature (!) 96.8 F (36 C), resp. rate 13, height _0  (1.854 m), weight (!) 165.7 kg, SpO2 96 %.        Intake/Output Summary (Last 24 hours) at 01/14/2022 0906 Last data filed at 01/14/2022 0900 Gross per 24 hour  Intake 518.72 ml  Output 25 ml  Net 493.72 ml   Filed Weights   01/13/22 2243 01/14/22 0651  Weight: (!) 173 kg (!) 165.7 kg   Resolved Hospital Problem list     Assessment & Plan:   Acute encephalopathy Likely due to hypercarbia and opiate accumulation in the setting of renal failure. Mild uremia. - continue supportive care  Acute hypercarbic respiratory failure Patient may have OHS, but VBG demonstrates more acute decompensation/acidosis. Respiratory depression and decompensation likely due to opiate accumulation. CT chest  demonstrated no infiltrate or volume overload. Awakens to voice and follows commands, but remains somnolent. ABG showed acidemia 7.25, pCO2 51 - continue oxygen supplementation with [place]  ? CAP Initially noted to have infiltrate vs edema on CXR, but follow up CT negative.  Patient has been afebrile with no leukocytosis.   - discontinue doxy - procal 0.38, can stop CTX as well.  Hypotension Favor hypovolemia due todehydration, though cardiogenic is possible. Not septic. Baseline weight appears to be around 175kg, weight on admission 165 kg. Hypovolemic hyponatremia would also fit this picture. - gentle fluids  - continue Levo - f/u ECHO   CHF with LVEF 35-40% 03/03/21 Patient does not appear to be too volume overloaded at this time, though exam is limited by body habitus.  - ECHO  Afib Takes Eliquis at home. holding Eliquis for possible procedures. - Heparin  Acute combined metabolic and respiratory acidosis Hypercarbia and renal failure.  - sodium bicarb drip 150 meq in sterile water 75cc/hr  Acute renal failure  ATN Uremia Baseline Cr 1.0, Currently 10 with mild uremia. Minimal urine output. No obstruction - gentle fluids - strict I+Os, daily weights - daily BMP - avoid nephrotoxic medications  Hyponatremia May be hypovolemic in the setting of dehydration.  - Check FeNA  DM2 Glucose has been stable. He takes home glipizide which may have prolonged effect in the setting of his renal failure - Q4 CBG - SSI   Best Practice (right click and "Reselect all SmartList Selections" daily)   Diet/type: NPO DVT prophylaxis: systemic heparin (for Afib, normally on eliquis but may need procedures).   GI prophylaxis: PPI Lines: N/A Foley:  present Code Status:  full code Last date of multidisciplinary goals of care discussion _1   Delene Ruffini, MD  Critical care time: 45 min

## 2022-01-14 NOTE — Progress Notes (Signed)
A-Line placed by Emergency MD in right radial.

## 2022-01-14 NOTE — H&P (Addendum)
NAME:  Taylor [name], MRN:  [id number], DOB:  1955-08-27, LOS: 0 ADMISSION DATE:  01/13/2022, CONSULTATION DATE:  01/14/22 REFERRING MD:  [name] , CHIEF COMPLAINT:  AMS    History of Present Illness:  66 yo man with HTN, CHF ef 35-40%, HLD, afib on eliquis, DM2, obesity, back pain (on ambien, oxycodone, dilaudid, lyrica.  Presenting with AMS, new renal failure.   Only answering simple questions on arrival to ap ed.  Pain meds at home Apparently somewhat lethargic for the past 5 days since returning from a trip to [place].  Sleepy, nausea.    In ed found to have B LE edema.   Hypotensive.  Started on levophed.  Given LR 500cc  x 2.   Levophed.   Transferred to [name] ED (no ICU beds).   Arousable, only answers simple questions.  Falls asleep.   Denies pain.   Pertinent  Medical History  Dm2 HTN HFrEF Arthritis  CAD OSA on cpap Afib Back pain Morbid obesity Depression HLD  Meds: eliquis, farxiga, glipizide, actos, dilaudid, lisinopril, metformin, metoprolol, movantik, oxycontin, lyrica, simvastatin, bumex, B12.   Coronary angiography 10/21, TEE 10/22     Significant Hospital Events: Including procedures, antibiotic start and stop dates in addition to other pertinent events     Interim History / Subjective:    Objective   Blood pressure 100/60, pulse (!) 53, temperature 98.8 F (37.1 C), resp. rate 11, height _0  (1.854 m), weight (!) 173 kg, SpO2 100 %.        Intake/Output Summary (Last 24 hours) at 01/14/2022 0331 Last data filed at 01/14/2022 0300 Gross per 24 hour  Intake 10.64 ml  Output --  Net 10.64 ml   Filed Weights   01/13/22 2243  Weight: (!) 173 kg    Examination: General: pleasant, oriented, lethargic, falls back to sleep easily  HENT: NCAT Lungs: CTAB Cardiovascular: rrr  Abdomen: distended, nbs  Extremities: B non pitting edema to knees Neuro: lethargic, falls asleep.  Moving all 4 extremities    Labs:  7.26/52/39  (VBG) BUN 69, Cr 9.94 Calcium 7.8 alb 3.5  AG 18  Lac 2.6 --> 2.5  Blood cx P  CT head negative  CXR B infiltrates.   Echo 03/03/21: Left ventricular ejection fraction, by estimation, is 35 to 40%. The  left ventricle has moderately decreased function. The left ventricle  demonstrates global hypokinesis. The left ventricular internal cavity size  was mildly dilated.   2. Right ventricular systolic function is mildly reduced. The right  ventricular size is normal.   CT Ch abd pelv:  IMPRESSION: 1. Cardiomegaly without findings of CHF. 2. Aortic and coronary artery atherosclerosis. 3. Bronchial thickening in the lower lobes without evidence of bronchopneumonia, with mild scattered subpleural reticulation in the lung bases. 4. Dendritic gynecomastia. 5. Some images may suggest pericholecystic edema but there is motion artifact through the area limiting evaluation of the gallbladder. There is gallbladder distention without calcified stones. The wall is not well seen. Ultrasound may be helpful if there is clinical uncertainty as to possible cholecystitis. 6. Mild hepatosplenomegaly and hepatic steatosis. 7. The unenhanced kidneys unremarkable except for nonobstructing micronephrolithiasis. 8. Cystitis versus bladder nondistention. 9. Diverticulosis without evidence of diverticulitis. 10. Umbilical and inguinal fat hernias and remaining findings described above. Resolved Hospital Problem list     Assessment & Plan:  Hypotension: Cardiogenic vs opiates in setting of renal failure.   B ggo, likely pulm edema Stopped maintenance fluids.  Broad coverage for CAP given CT findings, though presentation really not c/w sepsis/pna.  Will stop if procal neg.   AMS - hypotension, opiates, uremia.    AKI - unclear cause.  UA pending. Cardiorenal syndrome possible.    CHF - systolic in past.  Bedside echo: hypertrophy of ventricular septum?  Formal echo pending.  B LE edema.  May  need HD if doesn't start urinating.   DM2 ISS  Hepatosplenomegaly, hepatic steatosis, Mild:  Cardiac?  Portal hypertension?   Best Practice (right click and "Reselect all SmartList Selections" daily)   Diet/type: NPO DVT prophylaxis: systemic heparin (for Afib, normally on eliquis but may need procedures).   GI prophylaxis: PPI Lines: N/A Foley:  N/A Code Status:  full code Last date of multidisciplinary goals of care discussion _0   Labs   CBC: Recent Labs  Lab 01/13/22 2331  WBC 8.8  NEUTROABS 5.8  HGB 13.0  HCT 40.5  MCV 88.0  PLT 035    Basic Metabolic Panel: Recent Labs  Lab 01/13/22 2331  NA 132*  K 4.4  CL 92*  CO2 22  GLUCOSE 153*  BUN 69*  CREATININE 9.94*  CALCIUM 7.8*   GFR: Estimated Creatinine Clearance: 12.4 mL/min (A) (by C-G formula based on SCr of 9.94 mg/dL (H)). Recent Labs  Lab 01/13/22 2331 01/14/22 0109  WBC 8.8  --   LATICACIDVEN 2.6* 2.5*    Liver Function Tests: Recent Labs  Lab 01/13/22 2331  AST 21  ALT 18  ALKPHOS 83  BILITOT 0.7  PROT 7.8  ALBUMIN 3.5   Recent Labs  Lab 01/13/22 2331  LIPASE 25   No results for input(s): "AMMONIA" in the last 168 hours.  ABG    Component Value Date/Time   HCO3 23.3 01/13/2022 2331   ACIDBASEDEF 4.3 (H) 01/13/2022 2331   O2SAT 58.1 01/13/2022 2331     Coagulation Profile: No results for input(s): "INR", "PROTIME" in the last 168 hours.  Cardiac Enzymes: No results for input(s): "CKTOTAL", "CKMB", "CKMBINDEX", "TROPONINI" in the last 168 hours.  HbA1C: Hemoglobin A1C  Date/Time Value Ref Range Status  02/18/2021 10:22 AM 6.6 (A) 4.0 - 5.6 % Final  05/13/2020 08:53 AM 6.9 (A) 4.0 - 5.6 % Final   Hgb A1c MFr Bld  Date/Time Value Ref Range Status  10/16/2020 10:46 AM 7.5 (H) 4.8 - 5.6 % Final    Comment:             Prediabetes: 5.7 - 6.4          Diabetes: >6.4          Glycemic control for adults with diabetes: <7.0   09/25/2019 03:09 PM 6.9 (H) 4.8 - 5.6 %  Final    Comment:             Prediabetes: 5.7 - 6.4          Diabetes: >6.4          Glycemic control for adults with diabetes: <7.0     CBG: Recent Labs  Lab 01/13/22 2244  GLUCAP 155*    Unable to assess.   Past Medical History:  He,  has a past medical history of Arthritis, Broken leg (02/07/2019), Depression, Diabetes mellitus without complication ([place]), and Hypertension.   Surgical History:   Past Surgical History:  Procedure Laterality Date   BACK SURGERY     CARDIOVERSION N/A 08/14/2020   Procedure: CARDIOVERSION;  Surgeon: Kate Sable, MD;  Location: [facility] ORS;  Service:  Cardiovascular;  Laterality: N/A;   CARDIOVERSION N/A 03/03/2021   Procedure: CARDIOVERSION (CATH LAB);  Surgeon: Vickie Epley, MD;  Location: [place] [facility] LAB;  Service: Cardiovascular;  Laterality: N/A;   LEFT HEART CATH AND CORONARY ANGIOGRAPHY Left 04/05/2020   Procedure: LEFT HEART CATH AND CORONARY ANGIOGRAPHY;  Surgeon: Wellington Hampshire, MD;  Location: [place] [facility] LAB;  Service: Cardiovascular;  Laterality: Left;   TEE WITHOUT CARDIOVERSION N/A 03/03/2021   Procedure: TRANSESOPHAGEAL ECHOCARDIOGRAM (TEE);  Surgeon: Vickie Epley, MD;  Location: [place] [facility];  Service: Cardiovascular;  Laterality: N/A;     Social History:   reports that he has never smoked. He has never used smokeless tobacco. He reports that he does not drink alcohol and does not use drugs.   Family History:  His family history includes Diabetes in his mother and sister.   Allergies Allergies  Allergen Reactions   Other Itching    Pain medication/ Must take brand named CAN NOT take generic     Home Medications  Prior to Admission medications   Medication Sig Start Date End Date Taking? Authorizing Provider  apixaban (ELIQUIS) 5 MG TABS tablet TAKE 1 TABLET BY MOUTH TWICE DAILY 12/23/21   Kate Sable, MD  dapagliflozin propanediol (FARXIGA) 10 MG TABS tablet Take 1 tablet (10 mg  total) by mouth daily before breakfast. 09/22/21   Birdie Sons, MD  glipiZIDE (GLUCOTROL XL) 10 MG 24 hr tablet TAKE 1 TABLET BY MOUTH ONCE DAILY. 04/14/21   Birdie Sons, MD  glucose blood (ONETOUCH ULTRA) test strip Use to check sugar daily for type 2 diabetes E11.9 09/25/19   Birdie Sons, MD  HYDROmorphone (DILAUDID) 4 MG tablet Take 4 mg by mouth in the morning, at noon, in the evening, and at bedtime. Must be name brand    [provider]  lisinopril (ZESTRIL) 10 MG tablet TAKE 1 TABLET BY MOUTH ONCE DAILY. 09/16/21   Birdie Sons, MD  metFORMIN (GLUCOPHAGE) 1000 MG tablet TAKE 1 TABLET BY MOUTH TWICE DAILY 06/10/21   Birdie Sons, MD  metoprolol (TOPROL XL) 200 MG 24 hr tablet Take 1 tablet (200 mg total) by mouth daily. 07/17/21   Kate Sable, MD  MOVANTIK 25 MG TABS tablet  05/16/21   [provider]  NARCAN 4 MG/0.1ML LIQD nasal spray kit Place 0.4 mg into the nose as needed (accidental overdose). 07/04/18   [provider]  OXYCONTIN 20 MG 12 hr tablet Take 20 mg by mouth in the morning and at bedtime. Take 20 mg by mouth four times a day 03/30/20   [provider]  pioglitazone (ACTOS) 45 MG tablet TAKE 1 TABLET BY MOUTH ONCE DAILY. 06/25/21   Birdie Sons, MD  potassium chloride SA (KLOR-CON) 20 MEQ tablet Take 1 tablet (20 mEq total) by mouth daily. 04/24/21   Kate Sable, MD  pregabalin (LYRICA) 100 MG capsule Take 100 mg by mouth 3 (three) times daily.    [provider]  simvastatin (ZOCOR) 40 MG tablet TAKE 1 TABLET BY MOUTH ONCE DAILY. 09/11/21   Birdie Sons, MD  torsemide (DEMADEX) 20 MG tablet Take 40 MG (2 tabs) every AM, and Take 20 MG (1 tab) every PM. 12/10/21   Kate Sable, MD  vitamin B-12 (CYANOCOBALAMIN) 1000 MCG tablet Take 1,000 mcg by mouth daily.    [provider]  zolpidem (AMBIEN CR) 12.5 MG CR tablet Take 12.5 mg by mouth at bedtime.  03/03/07   [provider]      Critical care time: 45 min

## 2022-01-14 NOTE — ED Notes (Signed)
Patient transported to CT 

## 2022-01-14 NOTE — Consult Note (Addendum)
Reason for Consult:AKI Referring Physician: Duwayne Heck, MD  Taylor [name] is an 66 y.o. male with a PMH significant for HTN, DM type 2, obesity, CAD, NICM (EF 35-40%), permanent atrial fibrillation, OSA, and chronic back pain (on ambien, oxycodone, dilaudid, and lyrica) who presented to [facility] ED on 01/13/22 with a 5 day history of increasing lethargy and fatigue.  Also had nausea, vomiting, and SOB.  In the ED, VSS but somnolent and confused.  Labs notable for Na 132, Cl 92, BUN 69, Cr 9.94, Ca 7.8, BNP 119, lactate 2.6.  He became hypotensive and started on levophed and transferred to [facility] ED for ICU admission.  We were consulted due to his AKI.  The trend in Scr is seen below.  Pt remains confused and unable to participate with his HPI, which was obtained through review of the EMR.  Of note, he had been taking lisinopril, farxiga, metformin, torsemide, and lyrica prior to admission.  Wilder Glade was started on 09/22/21 but no labs were drawn after initiation, or none available for review.  Trend in Creatinine: Creatinine, Ser  Date/Time Value Ref Range Status  01/14/2022 08:36 AM 9.97 (H) 0.61 - 1.24 mg/dL Final  01/14/2022 06:31 AM 10.00 (H) 0.61 - 1.24 mg/dL Final  01/13/2022 11:31 PM 9.94 (H) 0.61 - 1.24 mg/dL Final  07/17/2021 11:48 AM 1.04 0.76 - 1.27 mg/dL Final  03/03/2021 10:25 AM 1.31 (H) 0.61 - 1.24 mg/dL Final  10/16/2020 10:46 AM 1.13 0.76 - 1.27 mg/dL Final  08/08/2020 11:03 AM 1.18 0.76 - 1.27 mg/dL Final  03/25/2020 12:58 PM 1.25 0.76 - 1.27 mg/dL Final  01/30/2020 02:26 PM 1.01 0.76 - 1.27 mg/dL Final  12/07/2019 03:41 PM 1.12 0.76 - 1.27 mg/dL Final  11/27/2019 11:51 AM 0.98 0.76 - 1.27 mg/dL Final  09/25/2019 03:09 PM 0.92 0.76 - 1.27 mg/dL Final  03/13/2019 11:14 AM 0.96 0.76 - 1.27 mg/dL Final  08/09/2017 09:23 AM 0.93 0.76 - 1.27 mg/dL Final  07/30/2016 11:23 AM 1.07 0.76 - 1.27 mg/dL Final  01/24/2015 11:46 AM 1.11 0.76 - 1.27 mg/dL Final    PMH:   Past Medical History:   Diagnosis Date   Arthritis    Broken leg 02/07/2019   Depression    Diabetes mellitus without complication ([place])    Hypertension     PSH:   Past Surgical History:  Procedure Laterality Date   BACK SURGERY     CARDIOVERSION N/A 08/14/2020   Procedure: CARDIOVERSION;  Surgeon: Kate Sable, MD;  Location: [facility] ORS;  Service: Cardiovascular;  Laterality: N/A;   CARDIOVERSION N/A 03/03/2021   Procedure: CARDIOVERSION (CATH LAB);  Surgeon: Vickie Epley, MD;  Location: [facility];  Service: Cardiovascular;  Laterality: N/A;   LEFT HEART CATH AND CORONARY ANGIOGRAPHY Left 04/05/2020   Procedure: LEFT HEART CATH AND CORONARY ANGIOGRAPHY;  Surgeon: Wellington Hampshire, MD;  Location: [facility];  Service: Cardiovascular;  Laterality: Left;   TEE WITHOUT CARDIOVERSION N/A 03/03/2021   Procedure: TRANSESOPHAGEAL ECHOCARDIOGRAM (TEE);  Surgeon: Vickie Epley, MD;  Location: [facility];  Service: Cardiovascular;  Laterality: N/A;    Allergies:  Allergies  Allergen Reactions   Other Itching    Pain medication/ Must take brand named CAN NOT take generic    Medications:   Prior to Admission medications   Medication Sig Start Date End Date Taking? Authorizing Provider  apixaban (ELIQUIS) 5 MG TABS tablet TAKE 1 TABLET BY MOUTH TWICE DAILY 12/23/21   Kate Sable,  MD  dapagliflozin propanediol (FARXIGA) 10 MG TABS tablet Take 1 tablet (10 mg total) by mouth daily before breakfast. 09/22/21   Birdie Sons, MD  glipiZIDE (GLUCOTROL XL) 10 MG 24 hr tablet TAKE 1 TABLET BY MOUTH ONCE DAILY. 04/14/21   Birdie Sons, MD  glucose blood (ONETOUCH ULTRA) test strip Use to check sugar daily for type 2 diabetes E11.9 09/25/19   Birdie Sons, MD  HYDROmorphone (DILAUDID) 4 MG tablet Take 4 mg by mouth in the morning, at noon, in the evening, and at bedtime. Must be name brand    [provider]  lisinopril (ZESTRIL) 10 MG tablet TAKE 1 TABLET BY  MOUTH ONCE DAILY. 09/16/21   Birdie Sons, MD  metFORMIN (GLUCOPHAGE) 1000 MG tablet TAKE 1 TABLET BY MOUTH TWICE DAILY 06/10/21   Birdie Sons, MD  metoprolol (TOPROL XL) 200 MG 24 hr tablet Take 1 tablet (200 mg total) by mouth daily. 07/17/21   Kate Sable, MD  MOVANTIK 25 MG TABS tablet  05/16/21   [provider]  NARCAN 4 MG/0.1ML LIQD nasal spray kit Place 0.4 mg into the nose as needed (accidental overdose). 07/04/18   [provider]  OXYCONTIN 20 MG 12 hr tablet Take 20 mg by mouth in the morning and at bedtime. Take 20 mg by mouth four times a day 03/30/20   [provider]  pioglitazone (ACTOS) 45 MG tablet TAKE 1 TABLET BY MOUTH ONCE DAILY. 06/25/21   Birdie Sons, MD  potassium chloride SA (KLOR-CON) 20 MEQ tablet Take 1 tablet (20 mEq total) by mouth daily. 04/24/21   Kate Sable, MD  pregabalin (LYRICA) 100 MG capsule Take 100 mg by mouth 3 (three) times daily.    [provider]  simvastatin (ZOCOR) 40 MG tablet TAKE 1 TABLET BY MOUTH ONCE DAILY. 09/11/21   Birdie Sons, MD  torsemide (DEMADEX) 20 MG tablet Take 40 MG (2 tabs) every AM, and Take 20 MG (1 tab) every PM. 12/10/21   Kate Sable, MD  vitamin B-12 (CYANOCOBALAMIN) 1000 MCG tablet Take 1,000 mcg by mouth daily.    [provider]  zolpidem (AMBIEN CR) 12.5 MG CR tablet Take 12.5 mg by mouth at bedtime.  03/03/07   [provider]    Inpatient medications:  Chlorhexidine Gluconate Cloth  6 each Topical Q0600   insulin aspart  0-20 Units Subcutaneous Q4H   ondansetron  4 mg Oral Once   pantoprazole (PROTONIX) IV  40 mg Intravenous Q24H    Discontinued Meds:   Medications Discontinued During This Encounter  Medication Reason   lactated ringers infusion    sodium bicarbonate 75 mEq in sodium chloride 0.45 % 1,075 mL infusion    doxycycline (VIBRAMYCIN) 100 mg in sodium chloride 0.9 % 250 mL IVPB     Social History:  reports that he has  never smoked. He has never used smokeless tobacco. He reports that he does not drink alcohol and does not use drugs.  Family History:   Family History  Problem Relation Age of Onset   Diabetes Mother    Diabetes Sister     Review of systems not obtained due to patient factors. Weight change:   Intake/Output Summary (Last 24 hours) at 01/14/2022 1029 Last data filed at 01/14/2022 0900 Gross per 24 hour  Intake 518.72 ml  Output 40 ml  Net 478.72 ml   BP (!) 89/60   Pulse 80   Temp (!) 97.2  F (36.2 C)   Resp (!) 23   Ht '6\' 1"'  (1.854 m)   Wt (!) 165.7 kg   SpO2 96%   BMI 48.20 kg/m  Vitals:   01/14/22 0915 01/14/22 0930 01/14/22 0945 01/14/22 1000  BP:      Pulse: 84 82 75 80  Resp: (!) '8 12 11 ' (!) 23  Temp: (!) 96.8 F (36 C) (!) 97 F (36.1 C) (!) 97 F (36.1 C) (!) 97.2 F (36.2 C)  TempSrc:      SpO2: 96% 98% 90% 96%  Weight:      Height:         General appearance: delirious, morbidly obese, and somnolent Head: Normocephalic, without obvious abnormality, atraumatic Eyes: negative findings: lids and lashes normal, conjunctivae and sclerae normal, and corneas clear Resp: clear to auscultation bilaterally Cardio: irregularly irregular rhythm GI: soft, non-tender; bowel sounds normal; no masses,  no organomegaly Extremities: edema trace pretibial Neuro:  somnolent, +myoclonic jerking  Labs: Basic Metabolic Panel: Recent Labs  Lab 01/13/22 2331 01/14/22 0631 01/14/22 0836 01/14/22 0938  NA 132* 132* 132* 131*  K 4.4 4.9  --  4.6  CL 92* 94*  --   --   CO2 22 21*  --   --   GLUCOSE 153* 144*  --   --   BUN 69* 66*  --   --   CREATININE 9.94* 10.00* 9.97*  --   ALBUMIN 3.5 3.1*  --   --   CALCIUM 7.8* 7.6*  --   --    Liver Function Tests: Recent Labs  Lab 01/13/22 2331 01/14/22 0631  AST 21 18  ALT 18 15  ALKPHOS 83 71  BILITOT 0.7 0.6  PROT 7.8 6.9  ALBUMIN 3.5 3.1*   Recent Labs  Lab 01/13/22 2331  LIPASE 25   No results for input(s):  "AMMONIA" in the last 168 hours. CBC: Recent Labs  Lab 01/13/22 2331 01/14/22 0631 01/14/22 0938  WBC 8.8 9.6  --   NEUTROABS 5.8 6.4  --   HGB 13.0 12.3* 12.6*  HCT 40.5 37.9* 37.0*  MCV 88.0 88.1  --   PLT 207 205  --    PT/INR: '@LABRCNTIP' (inr:5) Cardiac Enzymes: )No results for input(s): "CKTOTAL", "CKMB", "CKMBINDEX", "TROPONINI" in the last 168 hours. CBG: Recent Labs  Lab 01/13/22 2244 01/14/22 0631 01/14/22 0745  GLUCAP 155* 146* 147*    Iron Studies: No results for input(s): "IRON", "TIBC", "TRANSFERRIN", "FERRITIN" in the last 168 hours.  Xrays/Other Studies: CT CHEST ABDOMEN PELVIS WO CONTRAST  Result Date: 01/14/2022 CLINICAL DATA:  Nephrolithiasis with severe azotemia. Abnormal chest x-ray with perihilar opacities and low inspiration. EXAM: CT CHEST, ABDOMEN AND PELVIS WITHOUT CONTRAST TECHNIQUE: Multidetector CT imaging of the chest, abdomen and pelvis was performed following the standard protocol without IV contrast. RADIATION DOSE REDUCTION: This exam was performed according to the departmental dose-optimization program which includes automated exposure control, adjustment of the mA and/or kV according to patient size and/or use of iterative reconstruction technique. COMPARISON:  Portable chest today, PA Lat chest 09/01/2012, and chest CT with contrast 03/09/2013 FINDINGS: CT CHEST FINDINGS Cardiovascular: Mild-to-moderate cardiomegaly is noted new from 2014. There are scattered three-vessel coronary artery calcifications. There is mild aortic atherosclerosis without aneurysm. Pulmonary veins and arteries are normal caliber. Mediastinum/Nodes: There are scattered shotty subcentimeter mediastinal lymph nodes, unchanged since 2014. No new or enlarging adenopathy is seen. Axillary spaces are clear. The lower poles of the thyroid gland, esophageal wall,  and thoracic trachea unremarkable. Lungs/Pleura: There is no pleural effusion, thickening or pneumothorax. There is mild  posterior atelectasis in the lower lobes. Mild bronchial thickening is noted in both lower lobes without evidence of bronchopneumonia, confluent consolidation or mass. There are calcified granulomas in the upper lobes. There is scattered mild subpleural reticulation in the bases. Musculoskeletal: There are degenerative changes of the thoracic spine. There are no acute or significant osseous findings. No significant chest wall findings. Dendritic gynecomastia is again noted. CT ABDOMEN PELVIS FINDINGS Hepatobiliary: Enlarged liver, 24 cm length with mild-to-moderate steatosis. No focal liver masses seen without contrast. Gallbladder is mildly dilated, 12 cm in length. There is mild respiratory motion through the gallbladder but some images may suggest faint pericholecystic edema. There is no calcified stone, but early cholecystitis is not excluded. Pancreas: There is diffuse fatty atrophy of the pancreas especially pronounced in the head, uncinate process and neck segments. No focal abnormality or inflammatory change is seen. Spleen: Mildly enlarged, 15.5 cm length, without contrast otherwise unremarkable. Adrenals/Urinary Tract: There is no adrenal mass or focal abnormality of the unenhanced renal cortex. No significant cortical thinning is seen. There is trace symmetric perinephric stranding. There are a few bilateral punctate nonobstructive caliceal stones but there is no obstructing stone or hydronephrosis. Both ureters are decompressed and clear. The bladder is contracted and not well seen but could be thickened. Stomach/Bowel: Mildly fluid distended stomach without wall thickening. The unopacified small and large bowel demonstrate no dilatation or wall thickening. The appendix is normal. There are uncomplicated colonic diverticula. Vascular/Lymphatic: Aortic atherosclerosis. No enlarged abdominal or pelvic lymph nodes. Reproductive: No prostatomegaly. Other: There is a tiny umbilical fat hernia. There are small  bilateral inguinal fat hernias. There is no free air, free hemorrhage or free fluid Musculoskeletal: There is a unilateral right posterior fusion rod with pedicle screws at L4, L5 and S1 and solid fusions in between. There is facet hypertrophy in the lower lumbar segments. No acute or significant osseous findings. IMPRESSION: 1. Cardiomegaly without findings of CHF. 2. Aortic and coronary artery atherosclerosis. 3. Bronchial thickening in the lower lobes without evidence of bronchopneumonia, with mild scattered subpleural reticulation in the lung bases. 4. Dendritic gynecomastia. 5. Some images may suggest pericholecystic edema but there is motion artifact through the area limiting evaluation of the gallbladder. There is gallbladder distention without calcified stones. The wall is not well seen. Ultrasound may be helpful if there is clinical uncertainty as to possible cholecystitis. 6. Mild hepatosplenomegaly and hepatic steatosis. 7. The unenhanced kidneys unremarkable except for nonobstructing micronephrolithiasis. 8. Cystitis versus bladder nondistention. 9. Diverticulosis without evidence of diverticulitis. 10. Umbilical and inguinal fat hernias and remaining findings described above. Electronically Signed   By: Telford Nab M.D.   On: 01/14/2022 02:25   CT Head Wo Contrast  Result Date: 01/14/2022 CLINICAL DATA:  Altered mental status.  Confusion. EXAM: CT HEAD WITHOUT CONTRAST TECHNIQUE: Contiguous axial images were obtained from the base of the skull through the vertex without intravenous contrast. RADIATION DOSE REDUCTION: This exam was performed according to the departmental dose-optimization program which includes automated exposure control, adjustment of the mA and/or kV according to patient size and/or use of iterative reconstruction technique. COMPARISON:  Remote head CT 06/08/2006 FINDINGS: Brain: No intracranial hemorrhage, mass effect, or midline shift. No hydrocephalus. The basilar cisterns are  patent. No evidence of territorial infarct or acute ischemia. There is a prominent perivascular space in the left subinsular region. No extra-axial or intracranial fluid collection.  Vascular: No hyperdense vessel or unexpected calcification. Skull: No fracture or focal lesion. Sinuses/Orbits: Paranasal sinuses and mastoid air cells are clear. The visualized orbits are unremarkable. Other: None. IMPRESSION: No acute intracranial abnormality. Electronically Signed   By: Keith Rake M.D.   On: 01/14/2022 00:20   DG Chest Portable 1 View  Result Date: 01/14/2022 CLINICAL DATA:  Altered mental status. Concern for pneumonia versus edema. EXAM: PORTABLE CHEST 1 VIEW COMPARISON:  Remote radiograph 09/01/2012 FINDINGS: Lung volumes are low. In conjunction with soft tissue attenuation from habitus evaluation is limited. The heart is mildly enlarged but likely accentuated by AP technique. Patchy perihilar bilateral lung opacities. No large pneumothorax or pleural effusion. No acute osseous findings. IMPRESSION: Low lung volumes with patchy perihilar bilateral lung opacities that may represent pulmonary edema or multifocal pneumonia. Electronically Signed   By: Keith Rake M.D.   On: 01/14/2022 00:14     Assessment/Plan:  AKI - presumably ischemic ATN in setting of volume depletion and hypotension with concomitant ACE-inhibition.  Unclear if he has been taking NSAIDs for his chronic pain.  Scr was WNL on 07/17/21 but started on Farxiga in April 2023 without repeat renal function panel.  CT scan of abd/pelvis without hydronephrosis, trace perinephric stranding, and small nonobstructive stones.  UNa 13, UCr 209, FeNa 0.47% consistent with pre-renal etiology. No urgent indication for dialysis at this time, however may require CRRT if his condition continues to worsen. Continue to hold lisinopril, metformin, torsemide, lyrica, and farxiga.  Agree with IVF's.  Strict I's/O's.  Avoid nephrotoxic medications  including NSAIDs and iodinated intravenous contrast exposure unless the latter is absolutely indicated.   Preferred narcotic agents for pain control are hydromorphone, fentanyl, and methadone. Morphine should not be used.  Avoid Baclofen and avoid oral sodium phosphate and magnesium citrate based laxatives / bowel preps.  Continue strict Input and Output monitoring.  Will monitor the patient closely with you and intervene or adjust therapy as indicated by changes in clinical status/labs   Acute metabolic encephalopathy - likely multifactorial with hypercarbia, opiate accumulation as well as lyrica in setting of ARF. AGMA - likely due to metformin and ARF.  Continue with isotonic bicarb infusion.  Acute hypercarbic and hypoxic respiratory failure - likely due to OHS and opiate accumulation. Hypotension - likely volume depletion given low FeNa.  Agree with IVF's and will continue to follow.  Currently on levophed gtt.  Await ECHO. NICM - no evidence of volume overload as above.  Awaiting ECHO Hypovolemic hyponatremia - continue to follow with IVF's Myoclonic jerking - likely due to accumulation of lyrica in setting of ARF.  Continue to follow.     Governor Rooks Ellenie Salome 01/14/2022, 10:29 AM

## 2022-01-14 NOTE — ED Notes (Signed)
Critical care at bedside  

## 2022-01-14 NOTE — Significant Event (Signed)
Situation: Pt observed to have total body jerking, unresponsive with audible stridor and upper airway wheezing by Electa Sniff RN around 16:20. Pt last normal was around 16:10; at the time A&O to self and place. Following commands. No respiratory distress. This event lasted less than 2 minutes. Critical Care Medical team and RT at bedside.  Background: Metabolic Encephalopathy in setting of acute renal failure last Creatinine 9.45 pt followed by nephrology s/p 1L crystalloids fluid bolus and on Bicarb gtt and Levophed.  Assessment: Upon event pt noted to have downward gaze, pupils reactive ~ 51mm. Entire body jerking. Once event stopped pt was aroused by noxious stimuli and vocalized grunts. Pt was able to maintain SpO2 yet unable to protect airway. Able to localize pain and move all extremities. PERRLA.   Decision made to intubate pt for airway protection and later insertion of hemodialysis catheter with anticipation for possible dialysis. Sister Nanine Means Called for telephone consent for hemodialysis cathter placement.  Please review MAR for medications given.   Pt intubated with 7 ETT secured at lip @ 26cm.  PRVC FiO2 100% Peep 8 RR 16 VT 630. NGT placed to left nare with immediate gastric output of green thick bile.  Chest xray and EEG pending.  Recommendations: None all concerns addressed.

## 2022-01-14 NOTE — Progress Notes (Signed)
  Called to bedside for  witnessed seizure activity Somnolent but localises, opens eyes to name & sticks tongue out & goes back to sleep Myoclonic jerks note earlier attributed to lyrica in setting of AKI ABg - resp acidosis BMET - repeat cr 9.5 range, on bicarb gtt Intubated for airway protection since seizure meds required Ativan 2 mg given Vent/ sedation orders written EEG stat , head CT this am was neg Heparin held, HD catheter will be placed then resume. Hyoptensive - levo gtt can be titrated  Additional cc time x 32 m  Taylor [name] V. Taylor Loll MD

## 2022-01-14 NOTE — ED Provider Notes (Signed)
I assumed care of this patient.  Please see previous provider note for further details of Hx, PE.  Briefly patient is a 66 y.o. male who presented as a transfer from [facility] for renal failure and hypotension requiring pressors.  Critical care and nephrology were already aware the patient.  Patient is currently alert and oriented to self only. No physical complaints currently He is normotensive on 3 mcg of Levophed.  I spoke with Dr. Jayme Cloud from critical care who will come and evaluate the patient for admission.       Nira Conn, MD 01/14/22 [phone]

## 2022-01-14 NOTE — ED Notes (Signed)
Pt transported via [facility] from [facility]. Pt presented with AMS and increased confusion. Pt had recent travel and did not urinate for multiple days. Pt reports yesterday he had minimal output that was dark in color. Pt had no hx of kidney problems. Creat and BUN and lactic elevated. Pt was hypotensive so Levo was started, art line in the right radial. Pt currently has levo and LR infusing. Pt has been cold to the touch.  Pt is hard of hearing.

## 2022-01-14 NOTE — ED Notes (Signed)
Report given to [place], RN @ [facility]

## 2022-01-14 NOTE — Plan of Care (Addendum)
Taylor [name] was admitted from ED from [facility] for acute mental status change and acute renal failure.  Plan: Per Morning rounds; Bicarb gtt for acidosis and heparin gtt as pt has chronic atrial fibrillation (eliquis), ABG collected this am and has improved. Will closely monitor UO and glucose levels. Pt received total 1L fluid bolus with improvement of requirement of pressor. Bair hugger off pt is normothermic.  Neuro: Drowsy RASS -2/-1 Able to verbalize and follow commands. Oriented to self and place, able to maintain eye contact < 10 sec. Significant hx of home pain medications: dilaudid, lyrica & oxycodone.  Resp: No signs of distress no productive cough lung sounds Rhonchi, 2L [place] SpO2 > 92%  Cardiac: A fib Hypotensive on Levophed for MAP > 65, pt is very sensitive and requires 0-30mcg of levo. Pulses palpable x4 extremities. Cool extremities. Dependent edema noted. Active warming bair hugger medium setting.   GI: Significant abdominal distention unknown lbm. Currently NPO. Glucose monitoring and SSI coverage.  GU: Acute renal failure UO monitoring with Foley cath. Yellow clear urine.  Muscsk: Moves x4 extremities to commands. Overcomes gravity. Asterixis (flapping) noted to BUE at irregular intervals.  IV: 3 PIVs and Right radial arterial line  Gtts: Bicarb, Heparin, Levophed  Antibiotics: Cefepime  Skin: Ruddy appearance, protective foam in place. Bruising to Left AC. No areas of concern for breakdown.     Problem: Education: Goal: Knowledge of General Education information will improve Description: Including pain rating scale, medication(s)/side effects and non-pharmacologic comfort measures Outcome: Not Progressing  Problem: Clinical Measurements: Goal: Ability to maintain clinical measurements within normal limits will improve Outcome: Progressing Goal: Will remain free from infection Outcome: Progressing Goal: Diagnostic test results will improve Outcome:  Progressing  Problem: Activity: Goal: Risk for activity intolerance will decrease Outcome: Not Progressing    Problem: Nutrition: Goal: Adequate nutrition will be maintained Outcome: Not Progressing

## 2022-01-14 NOTE — ED Notes (Signed)
RT to change art line set up

## 2022-01-14 NOTE — Procedures (Signed)
Patient Name: Taylor [name]  MRN: [id number]  Epilepsy Attending: Charlsie Quest  Referring Physician/Provider: Lidia Collum, PA-C Date: 01/14/2022 Duration: 22.59 mins  Patient history: 66yo M with witnessed seizure activity. EEG to evaluate for seizure  Level of alertness:  lethargic   AEDs during EEG study: Ativan  Technical aspects: This EEG study was done with scalp electrodes positioned according to the 10-20 International system of electrode placement. Electrical activity was reviewed with band pass filter of 1-70Hz , sensitivity of 7 uV/mm, display speed of 28mm/sec with a 60Hz  notched filter applied as appropriate. EEG data were recorded continuously and digitally stored.  Video monitoring was available and reviewed as appropriate.  Description: EEG showed continuous generalize 3 to 6 Hz theta-delta slowing. Hyperventilation and photic stimulation were not performed.     ABNORMALITY - Continuous slow, generalized  IMPRESSION: This study is suggestive of severe diffuse encephalopathy, nonspecific etiology. No seizures or epileptiform discharges were seen throughout the recording.  A normal interictal EEG does not exclude nor support the diagnosis of epilepsy.   Clydie Dillen 

## 2022-01-14 NOTE — Progress Notes (Signed)
eLink Physician-Brief Progress Note Patient Name: Taylor [name] DOB: 1955/12/25 MRN: [id number]   Date of Service  01/14/2022  HPI/Events of Note  Patient transferred from outside hospital  to [facility] ICU for admission secondary to altered mental status, hypotension, acute kidney injury and possible sepsis, work up in progress.  eICU Interventions  New Patient Evaluation.        Thomasene Lot Geniene List 01/14/2022, 6:28 AM

## 2022-01-14 NOTE — ED Notes (Signed)
Pt ART line not reading again - Rapid response RN and RT at bedside

## 2022-01-14 NOTE — Progress Notes (Signed)
ANTICOAGULATION CONSULT NOTE - Initial Consult  Pharmacy Consult for Heparin Indication: atrial fibrillation  Allergies  Allergen Reactions   Other Itching    Pain medication/ Must take brand named CAN NOT take generic    Patient Measurements: Height: '6\' 1"'  (185.4 cm) Weight: (!) 173 kg (381 lb 6.3 oz) IBW/kg (Calculated) : 79.9 Heparin Dosing Weight: 120 kg  Vital Signs: Temp: 97.7 F (36.5 C) (08/09 0608) Temp Source: Axillary (08/09 0608) BP: 94/55 (08/09 0600) Pulse Rate: 76 (08/09 0608)  Labs: Recent Labs    01/13/22 2331 01/14/22 0109  HGB 13.0  --   HCT 40.5  --   PLT 207  --   CREATININE 9.94*  --   TROPONINIHS 20* 15    Estimated Creatinine Clearance: 12.4 mL/min (A) (by C-G formula based on SCr of 9.94 mg/dL (H)).   Medical History: Past Medical History:  Diagnosis Date   Arthritis    Broken leg 02/07/2019   Depression    Diabetes mellitus without complication (HCC)    Hypertension     Medications:  Medications Prior to Admission  Medication Sig Dispense Refill Last Dose   apixaban (ELIQUIS) 5 MG TABS tablet TAKE 1 TABLET BY MOUTH TWICE DAILY 60 tablet 5    dapagliflozin propanediol (FARXIGA) 10 MG TABS tablet Take 1 tablet (10 mg total) by mouth daily before breakfast. 21 tablet 0    glipiZIDE (GLUCOTROL XL) 10 MG 24 hr tablet TAKE 1 TABLET BY MOUTH ONCE DAILY. 90 tablet 4    glucose blood (ONETOUCH ULTRA) test strip Use to check sugar daily for type 2 diabetes E11.9 100 each 4    HYDROmorphone (DILAUDID) 4 MG tablet Take 4 mg by mouth in the morning, at noon, in the evening, and at bedtime. Must be name brand      lisinopril (ZESTRIL) 10 MG tablet TAKE 1 TABLET BY MOUTH ONCE DAILY. 90 tablet 0    metFORMIN (GLUCOPHAGE) 1000 MG tablet TAKE 1 TABLET BY MOUTH TWICE DAILY 180 tablet 4    metoprolol (TOPROL XL) 200 MG 24 hr tablet Take 1 tablet (200 mg total) by mouth daily. 30 tablet 5    MOVANTIK 25 MG TABS tablet       NARCAN 4 MG/0.1ML LIQD  nasal spray kit Place 0.4 mg into the nose as needed (accidental overdose).      OXYCONTIN 20 MG 12 hr tablet Take 20 mg by mouth in the morning and at bedtime. Take 20 mg by mouth four times a day      pioglitazone (ACTOS) 45 MG tablet TAKE 1 TABLET BY MOUTH ONCE DAILY. 90 tablet 4    potassium chloride SA (KLOR-CON) 20 MEQ tablet Take 1 tablet (20 mEq total) by mouth daily. 90 tablet 1    pregabalin (LYRICA) 100 MG capsule Take 100 mg by mouth 3 (three) times daily.      simvastatin (ZOCOR) 40 MG tablet TAKE 1 TABLET BY MOUTH ONCE DAILY. 90 tablet 0    torsemide (DEMADEX) 20 MG tablet Take 40 MG (2 tabs) every AM, and Take 20 MG (1 tab) every PM. 90 tablet 3    vitamin B-12 (CYANOCOBALAMIN) 1000 MCG tablet Take 1,000 mcg by mouth daily.      zolpidem (AMBIEN CR) 12.5 MG CR tablet Take 12.5 mg by mouth at bedtime.        Assessment: 66 y.o. male admitted with AMS and AKI,  h/o Afib and Eliquis on hold, for heparin  Goal of  Therapy:  aPTT 66-102 sec Heparin level 0.3-0.7 units/ml Monitor platelets by anticoagulation protocol: Yes   Plan:  After baseline coagulation labs drawn, start heparin 1800 units/hr Check aPTT in 8 hours   Koury Roddy, Bronson Curb 01/14/2022,6:49 AM

## 2022-01-14 NOTE — Progress Notes (Signed)
An USGPIV (ultrasound guided PIV) has been placed for short-term vasopressor infusion. A correctly placed ivWatch must be used when administering Vasopressors. Should this treatment be needed beyond 72 hours, central line access should be obtained.  It will be the responsibility of the bedside nurse to follow best practice to prevent extravasations. This IV site was placed Lt arm Proximal flower forearm

## 2022-01-14 NOTE — Progress Notes (Signed)
EEG complete - results pending 

## 2022-01-14 NOTE — Progress Notes (Signed)
RT changed art line set up per RN request and also cleaned and re-dressed site. No issues at this time.

## 2022-01-14 NOTE — ED Notes (Signed)
[facility] arrived to transport pt 

## 2022-01-14 NOTE — ED Notes (Signed)
ED Provider at bedside attempting to place A line.

## 2022-01-14 NOTE — Procedures (Signed)
Intubation Procedure Note  Taylor [name]  [id number]  01-31-1956  Date:01/14/22  Time:5:14 PM   Provider Performing:Ivana Nicastro D Suzie Portela    Procedure: Intubation (31500)  Indication(s) Respiratory Failure  Consent Unable to obtain consent due to emergent nature of procedure.   Anesthesia Etomidate, Versed, and Fentanyl   Time Out Verified patient identification, verified procedure, site/side was marked, verified correct patient position, special equipment/implants available, medications/allergies/relevant history reviewed, required imaging and test results available.   Sterile Technique Usual hand hygeine, masks, and gloves were used   Procedure Description Patient positioned in bed supine.  Sedation given as noted above.  Patient was intubated with endotracheal tube using Glidescope.  View was Grade 1 full glottis .  Number of attempts was 1.  Colorimetric CO2 detector was consistent with tracheal placement.   Complications/Tolerance None; patient tolerated the procedure well. Chest X-ray is ordered to verify placement.   EBL Minimal   Specimen(s) None  [name]  Pulmonary & Critical Care 01/14/2022, 5:14 PM  Please see [email or web address] for pager details.  From 7A-7P if no response, please call [phone]. After hours, please call [facility] [phone].

## 2022-01-14 NOTE — ED Notes (Signed)
Report given to [facility], ETA 8-10 mins.

## 2022-01-14 NOTE — Consult Note (Signed)
NEURO HOSPITALIST CONSULT NOTE   Requestig physician: Dr. Elsworth Soho  Reason for Consult: Seizure  History obtained from:  ICU Staff and Chart     HPI:                                                                                                                                          Taylor [name] is a 66 y.o. male with a PMHx of HTN, DM, depression, atrial fibrillation on Eliquis, obesity, chronic back pain and arthritis who presented to the ED at [facility] yesterday night with altered mental status and renal failure.  He returned from a trip to [place] five days ago and has been lethargic since then with some nausea as well.  Of note, he takes Oxycontin, hydromorphone, Ambien and Lyrica at home.  He last took his medications at 5 PM the day before he presented to the ED.  Patient's family states that at baseline he is able to care for himself and that he manages his own medications.  He is found to be quite uremic with BUN of 69 and creatinine of 9.94.  His baseline creatinine is between 1.04 and 1.31.  Patient was also noted to be hypotensive on arrival and was treated with fluid boluses and Levophed.  He was then transferred here for further care and consideration of CRRT.  Patient had been oriented to self only, could not give detailed answers to questions and was not able to provide a history.  He was noted to have some myoclonic jerking, attributed to accumulation of Lyrica in setting of renal failure.  At 1620 today, patient had witnessed seizure activity consisting of jerking movements of the entire body and downward gaze along with audible stridor.  This episode lasted approximately two minutes.  Afterwards, patient  was responsive only to painful stimulation.  He was then intubated for airway protection at about 1700 with rocuronium used as the paralytic.   Past Medical History:  Diagnosis Date   Arthritis    Broken leg 02/07/2019   Depression    Diabetes  mellitus without complication ([place])    Hypertension     Past Surgical History:  Procedure Laterality Date   BACK SURGERY     CARDIOVERSION N/A 08/14/2020   Procedure: CARDIOVERSION;  Surgeon: Kate Sable, MD;  Location: [facility] ORS;  Service: Cardiovascular;  Laterality: N/A;   CARDIOVERSION N/A 03/03/2021   Procedure: CARDIOVERSION (CATH LAB);  Surgeon: Vickie Epley, MD;  Location: [place] [facility] LAB;  Service: Cardiovascular;  Laterality: N/A;   LEFT HEART CATH AND CORONARY ANGIOGRAPHY Left 04/05/2020   Procedure: LEFT HEART CATH AND CORONARY ANGIOGRAPHY;  Surgeon: Wellington Hampshire, MD;  Location: [place] [facility] LAB;  Service: Cardiovascular;  Laterality: Left;   TEE  WITHOUT CARDIOVERSION N/A 03/03/2021   Procedure: TRANSESOPHAGEAL ECHOCARDIOGRAM (TEE);  Surgeon: Vickie Epley, MD;  Location: [place] [facility];  Service: Cardiovascular;  Laterality: N/A;    Family History  Problem Relation Age of Onset   Diabetes Mother    Diabetes Sister              Social History:  reports that he has never smoked. He has never used smokeless tobacco. He reports that he does not drink alcohol and does not use drugs.  Allergies  Allergen Reactions   Other Itching    Pain medication/ Must take brand named CAN NOT take generic    MEDICATIONS:                                                                                                                     Prior to Admission:  Medications Prior to Admission  Medication Sig Dispense Refill Last Dose   apixaban (ELIQUIS) 5 MG TABS tablet TAKE 1 TABLET BY MOUTH TWICE DAILY 60 tablet 5    dapagliflozin propanediol (FARXIGA) 10 MG TABS tablet Take 1 tablet (10 mg total) by mouth daily before breakfast. 21 tablet 0    glipiZIDE (GLUCOTROL XL) 10 MG 24 hr tablet TAKE 1 TABLET BY MOUTH ONCE DAILY. 90 tablet 4    glucose blood (ONETOUCH ULTRA) test strip Use to check sugar daily for type 2 diabetes E11.9 100 each 4    HYDROmorphone  (DILAUDID) 4 MG tablet Take 4 mg by mouth in the morning, at noon, in the evening, and at bedtime. Must be name brand      lisinopril (ZESTRIL) 10 MG tablet TAKE 1 TABLET BY MOUTH ONCE DAILY. 90 tablet 0    metFORMIN (GLUCOPHAGE) 1000 MG tablet TAKE 1 TABLET BY MOUTH TWICE DAILY 180 tablet 4    metoprolol (TOPROL XL) 200 MG 24 hr tablet Take 1 tablet (200 mg total) by mouth daily. 30 tablet 5    MOVANTIK 25 MG TABS tablet       NARCAN 4 MG/0.1ML LIQD nasal spray kit Place 0.4 mg into the nose as needed (accidental overdose).      OXYCONTIN 20 MG 12 hr tablet Take 20 mg by mouth in the morning and at bedtime. Take 20 mg by mouth four times a day      pioglitazone (ACTOS) 45 MG tablet TAKE 1 TABLET BY MOUTH ONCE DAILY. 90 tablet 4    potassium chloride SA (KLOR-CON) 20 MEQ tablet Take 1 tablet (20 mEq total) by mouth daily. 90 tablet 1    pregabalin (LYRICA) 100 MG capsule Take 100 mg by mouth 3 (three) times daily.      simvastatin (ZOCOR) 40 MG tablet TAKE 1 TABLET BY MOUTH ONCE DAILY. 90 tablet 0    torsemide (DEMADEX) 20 MG tablet Take 40 MG (2 tabs) every AM, and Take 20 MG (1 tab) every PM. 90 tablet 3    vitamin B-12 (CYANOCOBALAMIN) 1000 MCG tablet Take 1,000 mcg  by mouth daily.      zolpidem (AMBIEN CR) 12.5 MG CR tablet Take 12.5 mg by mouth at bedtime.       Scheduled:  albuterol       Chlorhexidine Gluconate Cloth  6 each Topical Q0600   docusate  100 mg Per Tube BID   insulin aspart  0-20 Units Subcutaneous Q4H   ondansetron  4 mg Oral Once   pantoprazole sodium  40 mg Per Tube Daily   polyethylene glycol  17 g Per Tube Daily   Continuous:   prismasol BGK 4/2.5     sodium chloride Stopped (01/14/22 1451)   cefTRIAXone (ROCEPHIN)  IV Stopped (01/14/22 0370)   fentaNYL infusion INTRAVENOUS 200 mcg/hr (01/14/22 1900)   heparin 1,800 Units/hr (01/14/22 1905)   norepinephrine (LEVOPHED) Adult infusion 10 mcg/min (01/14/22 1900)   prismasol BGK 4/2.5     sodium bicarbonate 150  mEq in sterile water 1,150 mL infusion       ROS:                                                                                                                                       Unable to obtain due to AMS.    Blood pressure (!) 89/60, pulse (!) 109, temperature 98.1 F (36.7 C), resp. rate 18, height _0  (1.854 m), weight (!) 165.7 kg, SpO2 99 %.   General Examination:                                                                                                       Physical Exam  HEENT-  [place]/AT. No nuchal rigidity.    Lungs- Intubated Extremities- No edema  Neurological Examination (Exam performed at 7:10 PM, approximately 2 hours after rocuronium administration).  Mental Status: Eyes remain closed except for one instance of brief eye opening during which patient stares directly at examiner following sternal rub. Not following any commands or otherwise reacting to verbal stimuli. No attempts to communicate. Will localize to pain by briefly swatting examiner's hand.  Alert, oriented, thought content appropriate.  Speech fluent without evidence of aphasia.  Able to follow 3 step commands without difficulty. Cranial Nerves: II: PERRL 2 mm >> 1.5 mm, round and briskly reactive. No blink to threat. Did briefly make eye contact after sternal rub.  III,IV, VI: Eyes are conjugate at the midline. No doll's eye reflex. No ptosis. No nystagmus.  V,VII: Brisk corneal reflexes bilaterally VIII: No response to voice IX,X:  Intubated. Cough reflex intact.  XI: Head is midline XII: Unable to assess Motor/Sensory: Tone and bulk normal x 4.  Weak movement of RUE to pinch.  Brisk movement of LUE to pinch, swatting at examiner Weak withdrawal of each LE to noxious plantar stimulation.  Patient briefly arouses and uses LUE to swat at examiner's hand when pinching left thigh.  Deep Tendon Reflexes: 2+ and symmetric bilateral brachioradialis and patellae. Unable to elicit achilles reflexes.  Toes are mute.  Cerebellar/Gait: Unable to assess    Lab Results: Basic Metabolic Panel: Recent Labs  Lab 01/13/22 2331 01/14/22 0631 01/14/22 0836 01/14/22 0938 01/14/22 1420  NA 132* 132* 132* 131* 132*  K 4.4 4.9  --  4.6 5.0  CL 92* 94*  --   --  96*  CO2 22 21*  --   --  19*  GLUCOSE 153* 144*  --   --  134*  BUN 69* 66*  --   --  67*  CREATININE 9.94* 10.00* 9.97*  --  9.45*  CALCIUM 7.8* 7.6*  --   --  7.0*    CBC: Recent Labs  Lab 01/13/22 2331 01/14/22 0631 01/14/22 0938  WBC 8.8 9.6  --   NEUTROABS 5.8 6.4  --   HGB 13.0 12.3* 12.6*  HCT 40.5 37.9* 37.0*  MCV 88.0 88.1  --   PLT 207 205  --     Cardiac Enzymes: No results for input(s): "CKTOTAL", "CKMB", "CKMBINDEX", "TROPONINI" in the last 168 hours.  Lipid Panel: No results for input(s): "CHOL", "TRIG", "HDL", "CHOLHDL", "VLDL", "LDLCALC" in the last 168 hours.  Imaging: CT CHEST ABDOMEN PELVIS WO CONTRAST  Result Date: 01/14/2022 CLINICAL DATA:  Nephrolithiasis with severe azotemia. Abnormal chest x-ray with perihilar opacities and low inspiration. EXAM: CT CHEST, ABDOMEN AND PELVIS WITHOUT CONTRAST TECHNIQUE: Multidetector CT imaging of the chest, abdomen and pelvis was performed following the standard protocol without IV contrast. RADIATION DOSE REDUCTION: This exam was performed according to the departmental dose-optimization program which includes automated exposure control, adjustment of the mA and/or kV according to patient size and/or use of iterative reconstruction technique. COMPARISON:  Portable chest today, PA Lat chest 09/01/2012, and chest CT with contrast 03/09/2013 FINDINGS: CT CHEST FINDINGS Cardiovascular: Mild-to-moderate cardiomegaly is noted new from 2014. There are scattered three-vessel coronary artery calcifications. There is mild aortic atherosclerosis without aneurysm. Pulmonary veins and arteries are normal caliber. Mediastinum/Nodes: There are scattered shotty subcentimeter  mediastinal lymph nodes, unchanged since 2014. No new or enlarging adenopathy is seen. Axillary spaces are clear. The lower poles of the thyroid gland, esophageal wall, and thoracic trachea unremarkable. Lungs/Pleura: There is no pleural effusion, thickening or pneumothorax. There is mild posterior atelectasis in the lower lobes. Mild bronchial thickening is noted in both lower lobes without evidence of bronchopneumonia, confluent consolidation or mass. There are calcified granulomas in the upper lobes. There is scattered mild subpleural reticulation in the bases. Musculoskeletal: There are degenerative changes of the thoracic spine. There are no acute or significant osseous findings. No significant chest wall findings. Dendritic gynecomastia is again noted. CT ABDOMEN PELVIS FINDINGS Hepatobiliary: Enlarged liver, 24 cm length with mild-to-moderate steatosis. No focal liver masses seen without contrast. Gallbladder is mildly dilated, 12 cm in length. There is mild respiratory motion through the gallbladder but some images may suggest faint pericholecystic edema. There is no calcified stone, but early cholecystitis is not excluded. Pancreas: There is diffuse fatty atrophy of the pancreas especially pronounced in the head, uncinate  process and neck segments. No focal abnormality or inflammatory change is seen. Spleen: Mildly enlarged, 15.5 cm length, without contrast otherwise unremarkable. Adrenals/Urinary Tract: There is no adrenal mass or focal abnormality of the unenhanced renal cortex. No significant cortical thinning is seen. There is trace symmetric perinephric stranding. There are a few bilateral punctate nonobstructive caliceal stones but there is no obstructing stone or hydronephrosis. Both ureters are decompressed and clear. The bladder is contracted and not well seen but could be thickened. Stomach/Bowel: Mildly fluid distended stomach without wall thickening. The unopacified small and large bowel  demonstrate no dilatation or wall thickening. The appendix is normal. There are uncomplicated colonic diverticula. Vascular/Lymphatic: Aortic atherosclerosis. No enlarged abdominal or pelvic lymph nodes. Reproductive: No prostatomegaly. Other: There is a tiny umbilical fat hernia. There are small bilateral inguinal fat hernias. There is no free air, free hemorrhage or free fluid Musculoskeletal: There is a unilateral right posterior fusion rod with pedicle screws at L4, L5 and S1 and solid fusions in between. There is facet hypertrophy in the lower lumbar segments. No acute or significant osseous findings. IMPRESSION: 1. Cardiomegaly without findings of CHF. 2. Aortic and coronary artery atherosclerosis. 3. Bronchial thickening in the lower lobes without evidence of bronchopneumonia, with mild scattered subpleural reticulation in the lung bases. 4. Dendritic gynecomastia. 5. Some images may suggest pericholecystic edema but there is motion artifact through the area limiting evaluation of the gallbladder. There is gallbladder distention without calcified stones. The wall is not well seen. Ultrasound may be helpful if there is clinical uncertainty as to possible cholecystitis. 6. Mild hepatosplenomegaly and hepatic steatosis. 7. The unenhanced kidneys unremarkable except for nonobstructing micronephrolithiasis. 8. Cystitis versus bladder nondistention. 9. Diverticulosis without evidence of diverticulitis. 10. Umbilical and inguinal fat hernias and remaining findings described above. Electronically Signed   By: Telford Nab M.D.   On: 01/14/2022 02:25   CT Head Wo Contrast  Result Date: 01/14/2022 CLINICAL DATA:  Altered mental status.  Confusion. EXAM: CT HEAD WITHOUT CONTRAST TECHNIQUE: Contiguous axial images were obtained from the base of the skull through the vertex without intravenous contrast. RADIATION DOSE REDUCTION: This exam was performed according to the departmental dose-optimization program which  includes automated exposure control, adjustment of the mA and/or kV according to patient size and/or use of iterative reconstruction technique. COMPARISON:  Remote head CT 06/08/2006 FINDINGS: Brain: No intracranial hemorrhage, mass effect, or midline shift. No hydrocephalus. The basilar cisterns are patent. No evidence of territorial infarct or acute ischemia. There is a prominent perivascular space in the left subinsular region. No extra-axial or intracranial fluid collection. Vascular: No hyperdense vessel or unexpected calcification. Skull: No fracture or focal lesion. Sinuses/Orbits: Paranasal sinuses and mastoid air cells are clear. The visualized orbits are unremarkable. Other: None. IMPRESSION: No acute intracranial abnormality. Electronically Signed   By: Keith Rake M.D.   On: 01/14/2022 00:20   DG Chest Portable 1 View  Result Date: 01/14/2022 CLINICAL DATA:  Altered mental status. Concern for pneumonia versus edema. EXAM: PORTABLE CHEST 1 VIEW COMPARISON:  Remote radiograph 09/01/2012 FINDINGS: Lung volumes are low. In conjunction with soft tissue attenuation from habitus evaluation is limited. The heart is mildly enlarged but likely accentuated by AP technique. Patchy perihilar bilateral lung opacities. No large pneumothorax or pleural effusion. No acute osseous findings. IMPRESSION: Low lung volumes with patchy perihilar bilateral lung opacities that may represent pulmonary edema or multifocal pneumonia. Electronically Signed   By: Keith Rake M.D.   On: 01/14/2022 00:14  Assessment: 66 year old male presenting with acute renal failure yesterday. He had myoclonus attributable to build up of toxic levels of Lyrica in the context of concomitant renal failure. Subsequently he had a full blown GTC seizure in the ICU lasting for 2 minutes, following which he was intubated. He is on Eliquis outpatient for atrial fibrillation.  - Exam post-intubation reveals findings most consistent with a  combination of residual paralytic and sedation used for intubation, in conjunction with postictal state. No jerking, twitching, myoclonus or other seizure like activity noted.   - CT head: No acute intracranial abnormality.  - CXR: Low lung volumes with patchy perihilar bilateral lung opacities that may represent pulmonary edema or multifocal pneumonia. - EtOH level less than 10 yesterday - UDS positive for opiates but negative for benzodiazepines - Ionized calcium is low - Na low at 132 but this would not be sufficient to trigger a seizure - STAT EEG: Findings of generalized continuous slowing. This study is suggestive of severe diffuse encephalopathy, nonspecific to etiology. No seizures or epileptiform discharges were seen throughout the recording. - Seizure most likely is secondary to his acute uremia (per the literature, as uremia progresses, it has been proposed that the accumulation of guanidino compounds results in activation of excitatory N-methyl-D-aspartate (NMDA) receptors and inhibition of inhibitory GABA receptors, which may cause myoclonus and seizures), with hypocalcemia possibly lowering his seizure threshold as well. Possibility of EtOH withdrawal is also a consideration   Recommendations: - Scheduled low-dose benzodiazepine to lower the potential for withdrawal seizure in the context of possible EtOH use outpatient.  - CIWA protocol - Load with valproic acid 3300 mg IV x 1 and continue with 5 mg/kg IV TID (ordered) - Magnesium level ordered - Correct his serum Ca level - Inpatient seizure precautions - When patient awakens fully and is extubated, he should be counseled on outpatient seizure precautions: Per [facility] statutes, patients with seizures are not allowed to drive until  they have been seizure-free for six months. Use caution when using heavy equipment or power tools. Avoid working on ladders or at heights. Take showers instead of baths. Ensure the water  temperature is not too high on the home water heater. Do not go swimming alone. When caring for infants or small children, sit down when holding, feeding, or changing them to minimize risk of injury to the child in the event you have a seizure. Also, Maintain good sleep hygiene. Avoid alcohol.  40 minutes spent in the emergent neurological evaluation and management of this critically ill patient.   Electronically signed: Dr. Kerney Elbe 01/14/2022, 5:18 PM

## 2022-01-14 NOTE — ED Notes (Signed)
Dr. Cardama at bedside.  

## 2022-01-14 NOTE — ED Notes (Addendum)
Unable to obtain manual blood pressure, Dr. Posey Rea made aware. Awaiting orders.

## 2022-01-14 NOTE — Progress Notes (Signed)
Pt currently not available for EEG at this time. Will attempt at a later time when schedule permits

## 2022-01-14 NOTE — Procedures (Signed)
Central Venous Catheter Insertion Procedure Note  Taylor [name]  [id number]  07-24-1955  Date:01/14/22  Time:6:46 PM   Provider Performing:Kennah Hehr D Suzie Portela   Procedure: Insertion of Non-tunneled Central Venous Catheter(36556)with US guidance (58099)       Indication(s) Medication administration and Hemodialysis  Consent Risks of the procedure as well as the alternatives and risks of each were explained to the patient and/or caregiver.  Consent for the procedure was obtained and is signed in the bedside chart  Anesthesia Topical only with 1% lidocaine   Timeout Verified patient identification, verified procedure, site/side was marked, verified correct patient position, special equipment/implants available, medications/allergies/relevant history reviewed, required imaging and test results available.  Sterile Technique Maximal sterile technique including full sterile barrier drape, hand hygiene, sterile gown, sterile gloves, mask, hair covering, sterile ultrasound probe cover (if used).  Procedure Description Area of catheter insertion was cleaned with chlorhexidine and draped in sterile fashion.   With real-time ultrasound guidance a HD catheter was placed into the right internal jugular vein.  Nonpulsatile blood flow and easy flushing noted in all ports.  The catheter was sutured in place and sterile dressing applied.  Complications/Tolerance None; patient tolerated the procedure well. Chest X-ray is ordered to verify placement for internal jugular or subclavian cannulation.  Chest x-ray is not ordered for femoral cannulation.  EBL Minimal  Specimen(s) None  [name] [facility] 01/14/2022, 6:47 PM  Please see [email or web address] for pager details.  From 7A-7P if no response, please call [phone]. After hours, please call ELink [phone].

## 2022-01-14 NOTE — ED Notes (Signed)
Date and time results received: 01/14/22 12:34 AM  Test: Lactic Acid Critical Value: 2.6  Name of Provider Notified: Dr. Posey Rea  Orders Received? Or Actions Taken?: Acknowledged

## 2022-01-14 NOTE — Progress Notes (Signed)
Echocardiogram 2D Echocardiogram has been performed.  Gertie Fey A  01/14/2022, 4:04 PM

## 2022-01-15 ENCOUNTER — Inpatient Hospital Stay (HOSPITAL_COMMUNITY): Payer: Medicare Other

## 2022-01-15 DIAGNOSIS — R4182 Altered mental status, unspecified: Secondary | ICD-10-CM | POA: Diagnosis not present

## 2022-01-15 DIAGNOSIS — N179 Acute kidney failure, unspecified: Secondary | ICD-10-CM | POA: Diagnosis not present

## 2022-01-15 DIAGNOSIS — J9602 Acute respiratory failure with hypercapnia: Secondary | ICD-10-CM | POA: Diagnosis not present

## 2022-01-15 DIAGNOSIS — R579 Shock, unspecified: Secondary | ICD-10-CM | POA: Diagnosis not present

## 2022-01-15 DIAGNOSIS — G9340 Encephalopathy, unspecified: Secondary | ICD-10-CM | POA: Diagnosis not present

## 2022-01-15 LAB — APTT
aPTT: 56 seconds — ABNORMAL HIGH (ref 24–36)
aPTT: 112 seconds — ABNORMAL HIGH (ref 24–36)
aPTT: 110 seconds — ABNORMAL HIGH (ref 24–36)

## 2022-01-15 LAB — RENAL FUNCTION PANEL
Albumin: 2.7 g/dL — ABNORMAL LOW (ref 3.5–5.0)
Albumin: 2.4 g/dL — ABNORMAL LOW (ref 3.5–5.0)
Anion gap: 16 — ABNORMAL HIGH (ref 5–15)
Anion gap: 16 — ABNORMAL HIGH (ref 5–15)
BUN: 56 mg/dL — ABNORMAL HIGH (ref 8–23)
BUN: 48 mg/dL — ABNORMAL HIGH (ref 8–23)
CO2: 20 mmol/L — ABNORMAL LOW (ref 22–32)
CO2: 20 mmol/L — ABNORMAL LOW (ref 22–32)
Calcium: 7.4 mg/dL — ABNORMAL LOW (ref 8.9–10.3)
Calcium: 7.2 mg/dL — ABNORMAL LOW (ref 8.9–10.3)
Chloride: 96 mmol/L — ABNORMAL LOW (ref 98–111)
Chloride: 99 mmol/L (ref 98–111)
Creatinine, Ser: 7.87 mg/dL — ABNORMAL HIGH (ref 0.61–1.24)
Creatinine, Ser: 6.71 mg/dL — ABNORMAL HIGH (ref 0.61–1.24)
GFR, Estimated: 7 mL/min — ABNORMAL LOW (ref >60)
GFR, Estimated: 9 mL/min — ABNORMAL LOW (ref >60)
Glucose, Bld: 108 mg/dL — ABNORMAL HIGH (ref 70–99)
Glucose, Bld: 144 mg/dL — ABNORMAL HIGH (ref 70–99)
Phosphorus: 7.3 mg/dL — ABNORMAL HIGH (ref 2.5–4.6)
Phosphorus: 5.2 mg/dL — ABNORMAL HIGH (ref 2.5–4.6)
Potassium: 3.7 mmol/L (ref 3.5–5.1)
Potassium: 3.3 mmol/L — ABNORMAL LOW (ref 3.5–5.1)
Sodium: 132 mmol/L — ABNORMAL LOW (ref 135–145)
Sodium: 135 mmol/L (ref 135–145)

## 2022-01-15 LAB — POCT I-STAT 7, (LYTES, BLD GAS, ICA,H+H)
Acid-base deficit: 2 mmol/L (ref 0.0–2.0)
Bicarbonate: 23.2 mmol/L (ref 20.0–28.0)
Calcium, Ion: 0.97 mmol/L — ABNORMAL LOW (ref 1.15–1.40)
HCT: 35 % — ABNORMAL LOW (ref 39.0–52.0)
Hemoglobin: 11.9 g/dL — ABNORMAL LOW (ref 13.0–17.0)
O2 Saturation: 94 %
Patient temperature: 36.7
Potassium: 3.6 mmol/L (ref 3.5–5.1)
Sodium: 135 mmol/L (ref 135–145)
TCO2: 24 mmol/L (ref 22–32)
pCO2 arterial: 40.9 mmHg (ref 32–48)
pH, Arterial: 7.361 (ref 7.35–7.45)
pO2, Arterial: 72 mmHg — ABNORMAL LOW (ref 83–108)

## 2022-01-15 LAB — CBC
HCT: 35.8 % — ABNORMAL LOW (ref 39.0–52.0)
Hemoglobin: 11.9 g/dL — ABNORMAL LOW (ref 13.0–17.0)
MCH: 28.5 pg (ref 26.0–34.0)
MCHC: 33.2 g/dL (ref 30.0–36.0)
MCV: 85.6 fL (ref 80.0–100.0)
Platelets: 188 10*3/uL (ref 150–400)
RBC: 4.18 MIL/uL — ABNORMAL LOW (ref 4.22–5.81)
RDW: 15.5 % (ref 11.5–15.5)
WBC: 8.3 10*3/uL (ref 4.0–10.5)
nRBC: 0 % (ref 0.0–0.2)

## 2022-01-15 LAB — GLUCOSE, CAPILLARY
Glucose-Capillary: 108 mg/dL — ABNORMAL HIGH (ref 70–99)
Glucose-Capillary: 106 mg/dL — ABNORMAL HIGH (ref 70–99)
Glucose-Capillary: 131 mg/dL — ABNORMAL HIGH (ref 70–99)
Glucose-Capillary: 143 mg/dL — ABNORMAL HIGH (ref 70–99)
Glucose-Capillary: 141 mg/dL — ABNORMAL HIGH (ref 70–99)
Glucose-Capillary: 130 mg/dL — ABNORMAL HIGH (ref 70–99)

## 2022-01-15 LAB — MAGNESIUM: Magnesium: 2.1 mg/dL (ref 1.7–2.4)

## 2022-01-15 LAB — PROCALCITONIN: Procalcitonin: 0.34 ng/mL

## 2022-01-15 LAB — HEPARIN LEVEL (UNFRACTIONATED): Heparin Unfractionated: >1.1 IU/mL — ABNORMAL HIGH (ref 0.30–0.70)

## 2022-01-15 LAB — TRIGLYCERIDES: Triglycerides: 121 mg/dL (ref <150)

## 2022-01-15 MED ORDER — PROSOURCE TF20 ENFIT COMPATIBL EN LIQD
60.0000 mL | Freq: Three times a day (TID) | ENTERAL | Status: DC
  Administered 2022-01-15 – 2022-01-17 (×6): 60 mL
  Filled 2022-01-15 (×6): qty 60

## 2022-01-15 MED ORDER — CALCIUM GLUCONATE-NACL 2-0.675 GM/100ML-% IV SOLN
2.0000 g | Freq: Once | INTRAVENOUS | Status: AC
  Administered 2022-01-15: 2000 mg via INTRAVENOUS
  Filled 2022-01-15: qty 100

## 2022-01-15 MED ORDER — IPRATROPIUM-ALBUTEROL 0.5-2.5 (3) MG/3ML IN SOLN
3.0000 mL | RESPIRATORY_TRACT | Status: DC | PRN
  Filled 2022-01-15: qty 3

## 2022-01-15 MED ORDER — DEXMEDETOMIDINE HCL IN NACL 400 MCG/100ML IV SOLN: 0.4000 ug/kg/h | INTRAVENOUS | Status: DC

## 2022-01-15 MED ORDER — VITAL HIGH PROTEIN PO LIQD
1000.0000 mL | ORAL | Status: DC
  Administered 2022-01-15 – 2022-01-16 (×4): 1000 mL

## 2022-01-15 MED ORDER — PRISMASOL BGK 4/2.5 32-4-2.5 MEQ/L REPLACEMENT SOLN
Status: DC
  Filled 2022-01-15 (×4): qty 5000

## 2022-01-15 MED ORDER — ORAL CARE MOUTH RINSE
15.0000 mL | OROMUCOSAL | Status: DC
  Administered 2022-01-15 – 2022-01-17 (×29): 15 mL via OROMUCOSAL

## 2022-01-15 MED ORDER — ORAL CARE MOUTH RINSE: 15.0000 mL | OROMUCOSAL | Status: DC | PRN

## 2022-01-15 MED ORDER — POTASSIUM CHLORIDE 20 MEQ PO PACK
40.0000 meq | PACK | Freq: Once | ORAL | Status: AC
  Administered 2022-01-15: 40 meq
  Filled 2022-01-15: qty 2

## 2022-01-15 MED ORDER — PROPOFOL 1000 MG/100ML IV EMUL
INTRAVENOUS | Status: AC
  Administered 2022-01-15: 5 ug/kg/min via INTRAVENOUS
  Filled 2022-01-15: qty 100

## 2022-01-15 MED ORDER — PROPOFOL 1000 MG/100ML IV EMUL
5.0000 ug/kg/min | INTRAVENOUS | Status: DC
  Administered 2022-01-15 (×2): 25 ug/kg/min via INTRAVENOUS
  Administered 2022-01-15: 55 ug/kg/min via INTRAVENOUS
  Administered 2022-01-15: 35 ug/kg/min via INTRAVENOUS
  Administered 2022-01-15: 45 ug/kg/min via INTRAVENOUS
  Administered 2022-01-16: 30 ug/kg/min via INTRAVENOUS
  Administered 2022-01-16: 20 ug/kg/min via INTRAVENOUS
  Administered 2022-01-16: 25 ug/kg/min via INTRAVENOUS
  Filled 2022-01-15 (×4): qty 100
  Filled 2022-01-15: qty 200
  Filled 2022-01-15 (×2): qty 100

## 2022-01-15 NOTE — Progress Notes (Signed)
Subjective: Per RN at bedside, patient has been requiring multiple doses of fentanyl for sedation.  But when he was awake, he did attempt to follow some simple one-step commands.  Family at bedside deny alcohol use, denies prior history of seizures but are not very familiar with his medications  ROS: Unable to obtain due to poor mental status  Examination  Vital signs in last 24 hours: Temp:  [97 F (36.1 C)-99.1 F (37.3 C)] 98.1 F (36.7 C) (08/10 0930) Pulse Rate:  [59-130] 74 (08/10 0930) Resp:  [9-45] 24 (08/10 0930) BP: (86-126)/(56-77) 100/56 (08/10 0900) SpO2:  [90 %-100 %] 95 % (08/10 0930) Arterial Line BP: (64-158)/(49-82) 109/60 (08/10 0930) FiO2 (%):  [50 %-100 %] 50 % (08/10 0918) Weight:  [166.8 kg] 166.8 kg (08/10 0350)  General: lying in bed, NAD CVS: pulse-normal rate and rhythm RS: Intubated Extremities: + edema  Neuro: Winces to noxious stimuli but does not open eyes, PERRLA, corneal reflex intact, gag reflex intact, winces to noxious stimuli in bilateral lower extremity, withdraws to noxious stimuli with antigravity strength in bilateral upper extremities  Basic Metabolic Panel: Recent Labs  Lab 01/13/22 2331 01/14/22 0631 01/14/22 0836 01/14/22 0938 01/14/22 1420 01/14/22 1819 01/14/22 2030 01/14/22 2128 01/15/22 0327 01/15/22 0930  NA 132* 132* 132*   < > 132* 132*  --  131* 132* 135  K 4.4 4.9  --    < > 5.0 4.2  --  4.0 3.7 3.6  CL 92* 94*  --   --  96*  --   --   --  96*  --   CO2 22 21*  --   --  19*  --   --   --  20*  --   GLUCOSE 153* 144*  --   --  134*  --   --   --  108*  --   BUN 69* 66*  --   --  67*  --   --   --  56*  --   CREATININE 9.94* 10.00* 9.97*  --  9.45*  --   --   --  7.87*  --   CALCIUM 7.8* 7.6*  --   --  7.0*  --   --   --  7.4*  --   MG  --   --   --   --   --   --  2.3  --  2.1  --   PHOS  --   --   --   --   --   --   --   --  7.3*  --    < > = values in this interval not displayed.    CBC: Recent Labs  Lab  01/13/22 2331 01/14/22 0631 01/14/22 0938 01/14/22 1819 01/14/22 2128 01/15/22 0327 01/15/22 0930  WBC 8.8 9.6  --   --   --  8.3  --   NEUTROABS 5.8 6.4  --   --   --   --   --   HGB 13.0 12.3* 12.6* 12.6* 11.9* 11.9* 11.9*  HCT 40.5 37.9* 37.0* 37.0* 35.0* 35.8* 35.0*  MCV 88.0 88.1  --   --   --  85.6  --   PLT 207 205  --   --   --  188  --      Coagulation Studies: Recent Labs    01/14/22 0631  LABPROT 17.1*  INR 1.4*    Imaging CT head  without contrast 01/13/2022: No acute abnormality   ASSESSMENT AND PLAN: 66 year old male presented with altered mental status in the setting of AKI.  He was noted to have some myoclonic jerking which was attributed to Lyrica use in setting of renal failure.  However, on 01/14/2022 he had another episode concerning for seizure and was subsequently intubated.  Seizure-like activity AKI Hyponatremia -It is unclear if the event patient had was a seizure or myoclonic jerking. -Etiology for seizure-like activity: No evidence of infection, family denies alcohol use, not on any specific medications that could lower seizure threshold.  Recommendations -As it has unclear if patient had seizure or not, for now I would recommend continuing Depakote.  Will also obtain MRI brain without contrast to look for any acute abnormality. -If MRI brain is within normal limits, will discontinue Depakote once we are able to gather more information from family and patient -Continue to wean sedation as appropriate per ICU team -As needed IV Ativan 2 mg for clinical seizure-like activity -Management of rest of comorbidities per primary team -Discussed plan with family at bedside, nurse at bedside  I have spent a total of  35  minutes with the patient reviewing hospital notes,  test results, labs and examining the patient as well as establishing an assessment and plan.  > 50% of time was spent in direct patient care.   Lindie Spruce [facility] For questions after 5pm please refer to AMION to reach the Neurologist on call

## 2022-01-15 NOTE — Progress Notes (Signed)
eLink Physician-Brief Progress Note Patient Name: ADALID BECKMANN DOB: 03/05/1956 MRN: [id number]   Date of Service  01/15/2022  HPI/Events of Note  Multiple issues: 1. ABG on 70%/PRVC 20/TV 630/P 8 = 7.34/34/90/18.3 and 2. Hypokalcemia - Ionized Ca++ = 0.94.  eICU Interventions  Plan: Continue present ventilator management. Will replace Ca++.     Intervention Category Major Interventions: Respiratory failure - evaluation and management;Electrolyte abnormality - evaluation and management  Asyia Hornung [place] 01/15/2022, 12:36 AM

## 2022-01-15 NOTE — Progress Notes (Signed)
NAME:  Taylor [name], MRN:  [id number], DOB:  June 02, 1956, LOS: 1 ADMISSION DATE:  01/13/2022, CONSULTATION DATE:  01/14/22 REFERRING MD:  [name] , CHIEF COMPLAINT:  AMS    History of Present Illness:  66 yo man with HTN, CHF ef 35-40%, HLD, afib on eliquis, DM2, obesity, back pain (on ambien, oxycodone, dilaudid, lyrica.  Presenting with AMS, new renal failure.   Recently returned from [facility]. Since then has been excessively sleepy and fatigued with associated nausea. This is rapid change from basleine. Last took home meds yesterday 5pm. Denies pain. Arousable, only answers simple questions.  Falls asleep.    Transferred to [name] ED (no ICU beds).    In ed found to have B LE edema.   Hypotensive. Given LR 500cc  x 2.  Levophed.   Pertinent  Medical History   Past Medical History:  Diagnosis Date   Arthritis    Broken leg 02/07/2019   Depression    Diabetes mellitus without complication ([place])    Hypertension    No current facility-administered medications on file prior to encounter.   Current Outpatient Medications on File Prior to Encounter  Medication Sig Dispense Refill   apixaban (ELIQUIS) 5 MG TABS tablet TAKE 1 TABLET BY MOUTH TWICE DAILY 60 tablet 5   dapagliflozin propanediol (FARXIGA) 10 MG TABS tablet Take 1 tablet (10 mg total) by mouth daily before breakfast. 21 tablet 0   glipiZIDE (GLUCOTROL XL) 10 MG 24 hr tablet TAKE 1 TABLET BY MOUTH ONCE DAILY. 90 tablet 4   glucose blood (ONETOUCH ULTRA) test strip Use to check sugar daily for type 2 diabetes E11.9 100 each 4   HYDROmorphone (DILAUDID) 4 MG tablet Take 4 mg by mouth in the morning, at noon, in the evening, and at bedtime. Must be name brand     lisinopril (ZESTRIL) 10 MG tablet TAKE 1 TABLET BY MOUTH ONCE DAILY. 90 tablet 0   metFORMIN (GLUCOPHAGE) 1000 MG tablet TAKE 1 TABLET BY MOUTH TWICE DAILY 180 tablet 4   metoprolol (TOPROL XL) 200 MG 24 hr tablet Take 1 tablet (200 mg total) by mouth daily. 30 tablet 5    MOVANTIK 25 MG TABS tablet      NARCAN 4 MG/0.1ML LIQD nasal spray kit Place 0.4 mg into the nose as needed (accidental overdose).     OXYCONTIN 20 MG 12 hr tablet Take 20 mg by mouth in the morning and at bedtime. Take 20 mg by mouth four times a day     pioglitazone (ACTOS) 45 MG tablet TAKE 1 TABLET BY MOUTH ONCE DAILY. 90 tablet 4   potassium chloride SA (KLOR-CON) 20 MEQ tablet Take 1 tablet (20 mEq total) by mouth daily. 90 tablet 1   pregabalin (LYRICA) 100 MG capsule Take 100 mg by mouth 3 (three) times daily.     simvastatin (ZOCOR) 40 MG tablet TAKE 1 TABLET BY MOUTH ONCE DAILY. 90 tablet 0   torsemide (DEMADEX) 20 MG tablet Take 40 MG (2 tabs) every AM, and Take 20 MG (1 tab) every PM. 90 tablet 3   vitamin B-12 (CYANOCOBALAMIN) 1000 MCG tablet Take 1,000 mcg by mouth daily.     zolpidem (AMBIEN CR) 12.5 MG CR tablet Take 12.5 mg by mouth at bedtime.        Significant Hospital Events: Including procedures, antibiotic start and stop dates in addition to other pertinent events   8/9 admitted to ICU 8/9 intubated and HD cath placed after witnessed  myoclonic jerking  Interim History / Subjective:  Witnessed myoclonic jerking, intubated for airway protection and HD cath placed.   Objective   Blood pressure 92/63, pulse 69, temperature 98.1 F (36.7 C), resp. rate 20, height '6\' 1"'  (1.854 m), weight (!) 166.8 kg, SpO2 100 %.    Vent Mode: PRVC FiO2 (%):  [70 %-100 %] 70 % Set Rate:  [16 bmp-20 bmp] 20 bmp Vt Set:  [630 mL] 630 mL PEEP:  [8 cmH20] 8 cmH20 Plateau Pressure:  [25 cmH20-28 cmH20] 28 cmH20   Intake/Output Summary (Last 24 hours) at 01/15/2022 0814 Last data filed at 01/15/2022 0800 Gross per 24 hour  Intake 3631.67 ml  Output 2268 ml  Net 1363.67 ml   Filed Weights   01/13/22 2243 01/14/22 0651 01/15/22 0350  Weight: (!) 173 kg (!) 165.7 kg (!) 166.8 kg   Resolved Hospital Problem list     Assessment & Plan:   Acute encephalopathy Likely due to  hypercarbia and opiate accumulation in the setting of renal failure. Mild uremia. - continue supportive care  Generalized tonic clonic seizure Likely related to accumulation of Lyrica and uremia. Now undergoing CRRT since BP has been low. Intubated 8/9 for airway protection.  Sedated - continue CRRT - AEDs  Acute hypercarbic respiratory failure Patient may have OHS, but VBG demonstrates more acute decompensation/acidosis. Respiratory depression and decompensation likely due to opiate accumulation. CT chest demonstrated no infiltrate or volume overload. Intubated 8/9 to maintain airway s/p myoclonic jerking/Sz. - continue ventilation today. Can likely extubate tomorrow pending improvement in mental status - continue propofol  ? CAP Initially noted to have infiltrate vs edema on CXR, but follow up CT negative.  Patient has been afebrile with no leukocytosis.   - discontinue doxy - procal 0.38, continuing CTX for empiric coverage  Hypotension Favor hypovolemia due todehydration, though cardiogenic is possible. Not septic. Baseline weight appears to be around 175kg, weight on admission 165 kg. Hypovolemic hyponatremia would also fit this picture. Repeat ECHO showing EF 40-45% Requiring less pressors today - continue Levo - dc fluids   HFrEF Patient does not appear to be too volume overloaded at this time, though exam is limited by body habitus. Repeat echo 8/9 showing EF 40-45%. CXR showing possible early edema vs infiltrate.  - holding diuretics in the setting of renal failure  Afib Takes Eliquis at home. holding Eliquis for possible procedures. - Heparin  Acute combined metabolic and respiratory acidosis Hypercarbia and renal failure.  - bicarb stopped  Acute renal failure  ATN Uremia Baseline Cr 1.0, Currently 10 with mild uremia. Minimal urine output. No obstruction CRRT initiated after witnessed generalized myoclonic seizure thought related to medications. - gentle  fluids - strict I+Os, daily weights - daily BMP - avoid nephrotoxic medications - Continue CRRT today. May be able to dc tomorrow  Hyponatremia May be hypovolemic in the setting of dehydration.  - Check FeNA  DM2 Glucose has been stable. He takes home glipizide which may have prolonged effect in the setting of his renal failure - Q4 CBG - SSI -tube feeds   Best Practice (right click and "Reselect all SmartList Selections" daily)   Diet/type: NPO DVT prophylaxis: systemic heparin (for Afib, normally on eliquis but may need procedures).   GI prophylaxis: PPI Lines: N/A Foley:  present Code Status:  full code Last date of multidisciplinary goals of care discussion '[]'   Delene Ruffini, MD  Critical care time: 45 min

## 2022-01-15 NOTE — Progress Notes (Signed)
Patient ID: Taylor [name], male   DOB: 02-03-1956, 66 y.o.   MRN: [id number] S: per records, developed seizure like activity.  Pt was intubated and started on ativan.  Given likely accumulation of lyrica, CRRT was started yesterday and has tolerated it well.  O:BP 109/65   Pulse 66   Temp (!) 97.5 F (36.4 C)   Resp (!) 24   Ht 6\' 1"  (1.854 m)   Wt (!) 166.8 kg   SpO2 98%   BMI 48.52 kg/m   Intake/Output Summary (Last 24 hours) at 01/15/2022 1044 Last data filed at 01/15/2022 1000 Gross per 24 hour  Intake 3661.19 ml  Output 2580 ml  Net 1081.19 ml   Intake/Output: I/O last 3 completed shifts: In: 3916 [I.V.:2155.1; NG/GT:45; IV Piggyback:1715.9] Out: 1867 [Urine:707; Emesis/NG output:275]  Intake/Output this shift:  Total I/O In: 428.6 [I.V.:243.6; Other:140; NG/GT:45] Out: 763 [Urine:75; Emesis/NG output:450] Weight change: -6.2 kg Gen:Intubated and sedated CVS:RRR Resp: ventilated BS bilaterally 3917, +BS YCX:[id number] edema BLE  Recent Labs  Lab 01/13/22 2331 01/14/22 0631 01/14/22 0836 01/14/22 0938 01/14/22 1420 01/14/22 1819 01/14/22 2128 01/15/22 0327 01/15/22 0930  NA 132* 132* 132* 131* 132* 132* 131* 132* 135  K 4.4 4.9  --  4.6 5.0 4.2 4.0 3.7 3.6  CL 92* 94*  --   --  96*  --   --  96*  --   CO2 22 21*  --   --  19*  --   --  20*  --   GLUCOSE 153* 144*  --   --  134*  --   --  108*  --   BUN 69* 66*  --   --  67*  --   --  56*  --   CREATININE 9.94* 10.00* 9.97*  --  9.45*  --   --  7.87*  --   ALBUMIN 3.5 3.1*  --   --   --   --   --  2.7*  --   CALCIUM 7.8* 7.6*  --   --  7.0*  --   --  7.4*  --   PHOS  --   --   --   --   --   --   --  7.3*  --   AST 21 18  --   --   --   --   --   --   --   ALT 18 15  --   --   --   --   --   --   --    Liver Function Tests: Recent Labs  Lab 01/13/22 2331 01/14/22 0631 01/15/22 0327  AST 21 18  --   ALT 18 15  --   ALKPHOS 83 71  --   BILITOT 0.7 0.6  --   PROT 7.8 6.9  --   ALBUMIN 3.5  3.1* 2.7*   Recent Labs  Lab 01/13/22 2331  LIPASE 25   No results for input(s): "AMMONIA" in the last 168 hours. CBC: Recent Labs  Lab 01/13/22 2331 01/14/22 0631 01/14/22 0938 01/14/22 2128 01/15/22 0327 01/15/22 0930  WBC 8.8 9.6  --   --  8.3  --   NEUTROABS 5.8 6.4  --   --   --   --   HGB 13.0 12.3*   < > 11.9* 11.9* 11.9*  HCT 40.5 37.9*   < > 35.0* 35.8* 35.0*  MCV 88.0 88.1  --   --  85.6  --   PLT 207 205  --   --  188  --    < > = values in this interval not displayed.   Cardiac Enzymes: No results for input(s): "CKTOTAL", "CKMB", "CKMBINDEX", "TROPONINI" in the last 168 hours. CBG: Recent Labs  Lab 01/14/22 1530 01/14/22 1924 01/14/22 2320 01/15/22 0330 01/15/22 0744  GLUCAP 141* 159* 120* 108* 106*    Iron Studies: No results for input(s): "IRON", "TIBC", "TRANSFERRIN", "FERRITIN" in the last 72 hours. Studies/Results: DG CHEST PORT 1 VIEW  Result Date: 01/14/2022 CLINICAL DATA:  Central line placement. EXAM: PORTABLE CHEST 1 VIEW COMPARISON:  January 14, 2022 FINDINGS: Right IJ approach central venous catheter terminates at the expected location of the distal superior vena cava. There is no evidence of pneumothorax. Endotracheal tube terminates 5.5 cm superior to the carina. Enteric catheter collimated off the image. Enlarged cardiac silhouette. Streaky airspace opacities in bilateral hilar regions. Possible small right pleural effusion. IMPRESSION: 1. Right IJ approach central venous catheter terminates at the expected location of the distal superior vena cava. No evidence of pneumothorax. 2. Endotracheal tube 5.5 cm superior to the carina. Advancement may be considered. 3. Streaky airspace opacities in bilateral hilar regions. Question developing interstitial pulmonary edema. Electronically Signed   By: Ted Mcalpine M.D.   On: 01/14/2022 18:57   ECHOCARDIOGRAM COMPLETE  Result Date: 01/14/2022    ECHOCARDIOGRAM REPORT   Patient Name:   Taylor [name] Date of Exam: 01/14/2022 Medical Rec #:  [id number]         Height:       73.0 in Accession #:    [id number]        Weight:       365.3 lb Date of Birth:  [birth year]        BSA:          2.779 m Patient Age:    65 years          BP:           89/60 mmHg Patient Gender: M                 HR:           105 bpm. Exam Location:  Inpatient Procedure: 2D Echo, Cardiac Doppler, Color Doppler and Intracardiac            Opacification Agent Indications:     I50.9* Heart failure (unspecified)  History:         Patient has prior history of Echocardiogram examinations, most                  recent 03/03/2021. CHF; Risk Factors:Hypertension and Diabetes.  Sonographer:     Gertie Fey RDMS, RVT, RDCS Referring Phys:  [id number] Charlotte Sanes Diagnosing Phys: Epifanio Lesches MD IMPRESSIONS  1. Technically difficult study, very limited views even with contrast administration  2. Left ventricular ejection fraction, by estimation, is grossly 40 to 45%. The left ventricle has mildly decreased function. Left ventricular endocardial border not optimally defined to evaluate regional wall motion. Left ventricular diastolic parameters are indeterminate.  3. Right ventricule is not well visualized but grossly normal size and systolic function. There is normal pulmonary artery systolic pressure.  4. The mitral valve is grossly normal. Trivial mitral valve regurgitation.  5. The aortic valve was not well visualized. Aortic valve regurgitation is not visualized. No aortic stenosis is present.  6. The inferior vena cava is dilated in size with <  50% respiratory variability, suggesting right atrial pressure of 15 mmHg. FINDINGS  Left Ventricle: Left ventricular ejection fraction, by estimation, is 40 to 45%. The left ventricle has mildly decreased function. Left ventricular endocardial border not optimally defined to evaluate regional wall motion. Definity contrast agent was given IV to delineate the left ventricular endocardial  borders. The left ventricular internal cavity size was normal in size. There is no left ventricular hypertrophy. Left ventricular diastolic parameters are indeterminate. Right Ventricle: The right ventricular size is not well visualized. Right vetricular wall thickness was not well visualized. Right ventricular systolic function was not well visualized. There is normal pulmonary artery systolic pressure. The tricuspid regurgitant velocity is 2.14 m/s, and with an assumed right atrial pressure of 15 mmHg, the estimated right ventricular systolic pressure is 33.3 mmHg. Left Atrium: Left atrial size was not well visualized. Right Atrium: Right atrial size was not well visualized. Pericardium: Trivial pericardial effusion is present. Mitral Valve: The mitral valve is grossly normal. Trivial mitral valve regurgitation. Tricuspid Valve: The tricuspid valve is normal in structure. Tricuspid valve regurgitation is trivial. Aortic Valve: The aortic valve was not well visualized. Aortic valve regurgitation is not visualized. No aortic stenosis is present. Pulmonic Valve: The pulmonic valve was not well visualized. Pulmonic valve regurgitation is not visualized. Aorta: The aortic root is normal in size and structure. Venous: The inferior vena cava is dilated in size with less than 50% respiratory variability, suggesting right atrial pressure of 15 mmHg. IAS/Shunts: The interatrial septum was not well visualized.  LEFT VENTRICLE PLAX 2D LVIDd:         5.40 cm   Diastology LVIDs:         4.00 cm   LV e' lateral: 13.10 cm/s LV PW:         0.80 cm LV IVS:        0.90 cm LVOT diam:     2.00 cm LV SV:         30 LV SV Index:   11 LVOT Area:     3.14 cm  LEFT ATRIUM         Index LA diam:    4.30 cm 1.55 cm/m  AORTIC VALVE LVOT Vmax:   58.80 cm/s LVOT Vmean:  38.200 cm/s LVOT VTI:    0.096 m  AORTA Ao Root diam: 3.00 cm TRICUSPID VALVE TR Peak grad:   18.3 mmHg TR Vmax:        214.00 cm/s  SHUNTS Systemic VTI:  0.10 m Systemic  Diam: 2.00 cm Epifanio Lesches MD Electronically signed by Epifanio Lesches MD Signature Date/Time: 01/14/2022/6:56:43 PM    Final (Updated)    EEG adult  Result Date: 01/14/2022 Charlsie Quest, MD     01/14/2022  6:59 PM Patient Name: HIROSHI KRUMMEL MRN: [id number] Epilepsy Attending: Charlsie Quest Referring Physician/Provider: Lidia Collum, PA-C Date: 01/14/2022 Duration: 22.59 mins Patient history: 66yo M with witnessed seizure activity. EEG to evaluate for seizure Level of alertness:  lethargic AEDs during EEG study: Ativan Technical aspects: This EEG study was done with scalp electrodes positioned according to the 10-20 International system of electrode placement. Electrical activity was reviewed with band pass filter of 1-70Hz , sensitivity of 7 uV/mm, display speed of 77mm/sec with a 60Hz  notched filter applied as appropriate. EEG data were recorded continuously and digitally stored.  Video monitoring was available and reviewed as appropriate. Description: EEG showed continuous generalize 3 to 6 Hz theta-delta slowing. Hyperventilation and photic stimulation  were not performed.   ABNORMALITY - Continuous slow, generalized IMPRESSION: This study is suggestive of severe diffuse encephalopathy, nonspecific etiology. No seizures or epileptiform discharges were seen throughout the recording. A normal interictal EEG does not exclude nor support the diagnosis of epilepsy. Charlsie Quest   DG Chest Port 1 View  Result Date: 01/14/2022 CLINICAL DATA:  Endotracheal tube.  OG tube EXAM: PORTABLE CHEST 1 VIEW COMPARISON:  Chest a 01/2022 FINDINGS: Endotracheal tube in good position. Gastric tube in the stomach with the tip in the body the stomach. Cardiac enlargement. Improvement in probable vascular congestion. Mild atelectasis in the bases and left perihilar region. No effusion. IMPRESSION: Endotracheal tube in good position.  NG tube in the body the stomach Improved vascular congestion.  Mild  bilateral atelectasis. Electronically Signed   By: Marlan Palau M.D.   On: 01/14/2022 17:34   CT CHEST ABDOMEN PELVIS WO CONTRAST  Result Date: 01/14/2022 CLINICAL DATA:  Nephrolithiasis with severe azotemia. Abnormal chest x-ray with perihilar opacities and low inspiration. EXAM: CT CHEST, ABDOMEN AND PELVIS WITHOUT CONTRAST TECHNIQUE: Multidetector CT imaging of the chest, abdomen and pelvis was performed following the standard protocol without IV contrast. RADIATION DOSE REDUCTION: This exam was performed according to the departmental dose-optimization program which includes automated exposure control, adjustment of the mA and/or kV according to patient size and/or use of iterative reconstruction technique. COMPARISON:  Portable chest today, PA Lat chest 09/01/2012, and chest CT with contrast 03/09/2013 FINDINGS: CT CHEST FINDINGS Cardiovascular: Mild-to-moderate cardiomegaly is noted new from 2014. There are scattered three-vessel coronary artery calcifications. There is mild aortic atherosclerosis without aneurysm. Pulmonary veins and arteries are normal caliber. Mediastinum/Nodes: There are scattered shotty subcentimeter mediastinal lymph nodes, unchanged since 2014. No new or enlarging adenopathy is seen. Axillary spaces are clear. The lower poles of the thyroid gland, esophageal wall, and thoracic trachea unremarkable. Lungs/Pleura: There is no pleural effusion, thickening or pneumothorax. There is mild posterior atelectasis in the lower lobes. Mild bronchial thickening is noted in both lower lobes without evidence of bronchopneumonia, confluent consolidation or mass. There are calcified granulomas in the upper lobes. There is scattered mild subpleural reticulation in the bases. Musculoskeletal: There are degenerative changes of the thoracic spine. There are no acute or significant osseous findings. No significant chest wall findings. Dendritic gynecomastia is again noted. CT ABDOMEN PELVIS FINDINGS  Hepatobiliary: Enlarged liver, 24 cm length with mild-to-moderate steatosis. No focal liver masses seen without contrast. Gallbladder is mildly dilated, 12 cm in length. There is mild respiratory motion through the gallbladder but some images may suggest faint pericholecystic edema. There is no calcified stone, but early cholecystitis is not excluded. Pancreas: There is diffuse fatty atrophy of the pancreas especially pronounced in the head, uncinate process and neck segments. No focal abnormality or inflammatory change is seen. Spleen: Mildly enlarged, 15.5 cm length, without contrast otherwise unremarkable. Adrenals/Urinary Tract: There is no adrenal mass or focal abnormality of the unenhanced renal cortex. No significant cortical thinning is seen. There is trace symmetric perinephric stranding. There are a few bilateral punctate nonobstructive caliceal stones but there is no obstructing stone or hydronephrosis. Both ureters are decompressed and clear. The bladder is contracted and not well seen but could be thickened. Stomach/Bowel: Mildly fluid distended stomach without wall thickening. The unopacified small and large bowel demonstrate no dilatation or wall thickening. The appendix is normal. There are uncomplicated colonic diverticula. Vascular/Lymphatic: Aortic atherosclerosis. No enlarged abdominal or pelvic lymph nodes. Reproductive: No prostatomegaly. Other: There  is a tiny umbilical fat hernia. There are small bilateral inguinal fat hernias. There is no free air, free hemorrhage or free fluid Musculoskeletal: There is a unilateral right posterior fusion rod with pedicle screws at L4, L5 and S1 and solid fusions in between. There is facet hypertrophy in the lower lumbar segments. No acute or significant osseous findings. IMPRESSION: 1. Cardiomegaly without findings of CHF. 2. Aortic and coronary artery atherosclerosis. 3. Bronchial thickening in the lower lobes without evidence of bronchopneumonia, with  mild scattered subpleural reticulation in the lung bases. 4. Dendritic gynecomastia. 5. Some images may suggest pericholecystic edema but there is motion artifact through the area limiting evaluation of the gallbladder. There is gallbladder distention without calcified stones. The wall is not well seen. Ultrasound may be helpful if there is clinical uncertainty as to possible cholecystitis. 6. Mild hepatosplenomegaly and hepatic steatosis. 7. The unenhanced kidneys unremarkable except for nonobstructing micronephrolithiasis. 8. Cystitis versus bladder nondistention. 9. Diverticulosis without evidence of diverticulitis. 10. Umbilical and inguinal fat hernias and remaining findings described above. Electronically Signed   By: Almira Bar M.D.   On: 01/14/2022 02:25   CT Head Wo Contrast  Result Date: 01/14/2022 CLINICAL DATA:  Altered mental status.  Confusion. EXAM: CT HEAD WITHOUT CONTRAST TECHNIQUE: Contiguous axial images were obtained from the base of the skull through the vertex without intravenous contrast. RADIATION DOSE REDUCTION: This exam was performed according to the departmental dose-optimization program which includes automated exposure control, adjustment of the mA and/or kV according to patient size and/or use of iterative reconstruction technique. COMPARISON:  Remote head CT 06/08/2006 FINDINGS: Brain: No intracranial hemorrhage, mass effect, or midline shift. No hydrocephalus. The basilar cisterns are patent. No evidence of territorial infarct or acute ischemia. There is a prominent perivascular space in the left subinsular region. No extra-axial or intracranial fluid collection. Vascular: No hyperdense vessel or unexpected calcification. Skull: No fracture or focal lesion. Sinuses/Orbits: Paranasal sinuses and mastoid air cells are clear. The visualized orbits are unremarkable. Other: None. IMPRESSION: No acute intracranial abnormality. Electronically Signed   By: Narda Rutherford M.D.   On:  01/14/2022 00:20   DG Chest Portable 1 View  Result Date: 01/14/2022 CLINICAL DATA:  Altered mental status. Concern for pneumonia versus edema. EXAM: PORTABLE CHEST 1 VIEW COMPARISON:  Remote radiograph 09/01/2012 FINDINGS: Lung volumes are low. In conjunction with soft tissue attenuation from habitus evaluation is limited. The heart is mildly enlarged but likely accentuated by AP technique. Patchy perihilar bilateral lung opacities. No large pneumothorax or pleural effusion. No acute osseous findings. IMPRESSION: Low lung volumes with patchy perihilar bilateral lung opacities that may represent pulmonary edema or multifocal pneumonia. Electronically Signed   By: Narda Rutherford M.D.   On: 01/14/2022 00:14    Chlorhexidine Gluconate Cloth  6 each Topical Q0600   docusate  100 mg Per Tube BID   feeding supplement (VITAL HIGH PROTEIN)  1,000 mL Per Tube Q24H   insulin aspart  0-20 Units Subcutaneous Q4H   ondansetron  4 mg Oral Once   mouth rinse  15 mL Mouth Rinse Q2H   mouth rinse  15 mL Mouth Rinse Q2H   pantoprazole sodium  40 mg Per Tube Daily   polyethylene glycol  17 g Per Tube Daily    BMET    Component Value Date/Time   NA 135 01/15/2022 0930   NA 139 07/17/2021 1148   K 3.6 01/15/2022 0930   CL 96 (L) 01/15/2022 0327   CO2  20 (L) 01/15/2022 0327   GLUCOSE 108 (H) 01/15/2022 0327   BUN 56 (H) 01/15/2022 0327   BUN 11 07/17/2021 1148   CREATININE 7.87 (H) 01/15/2022 0327   CALCIUM 7.4 (L) 01/15/2022 0327   GFRNONAA 7 (L) 01/15/2022 0327   GFRAA 71 03/25/2020 1258   CBC    Component Value Date/Time   WBC 8.3 01/15/2022 0327   RBC 4.18 (L) 01/15/2022 0327   HGB 11.9 (L) 01/15/2022 0930   HGB 13.3 10/16/2020 1046   HCT 35.0 (L) 01/15/2022 0930   HCT 41.4 10/16/2020 1046   PLT 188 01/15/2022 0327   PLT 206 10/16/2020 1046   MCV 85.6 01/15/2022 0327   MCV 88 10/16/2020 1046   MCH 28.5 01/15/2022 0327   MCHC 33.2 01/15/2022 0327   RDW 15.5 01/15/2022 0327   RDW 15.1  10/16/2020 1046   LYMPHSABS 2.1 01/14/2022 0631   LYMPHSABS 2.7 03/25/2020 1258   MONOABS 0.9 01/14/2022 0631   EOSABS 0.2 01/14/2022 0631   EOSABS 0.2 03/25/2020 1258   BASOSABS 0.0 01/14/2022 0631   BASOSABS 0.1 03/25/2020 1258    Assessment/Plan:  AKI - presumably ischemic ATN in setting of volume depletion and hypotension with concomitant ACE-inhibition.  Unclear if he has been taking NSAIDs for his chronic pain.  Scr was WNL on 07/17/21 but started on Farxiga in April 2023 without repeat renal function panel.  CT scan of abd/pelvis without hydronephrosis, trace perinephric stranding, and small nonobstructive stones.  UNa 13, UCr 209, FeNa 0.47% consistent with pre-renal etiology. CRRT initiated 01/14/22 after seizure like activity and intubation.  All fluids 4K/2.5Ca, pre-filter and post-filter 400 ml/hr, dialysate 1500 mL/hr.  No heparin and to keep even. Continue to hold lisinopril, metformin, torsemide, lyrica, and farxiga.  Agree with IVF's.  Strict I's/O's.  Avoid nephrotoxic medications including NSAIDs and iodinated intravenous contrast exposure unless the latter is absolutely indicated.   Preferred narcotic agents for pain control are hydromorphone, fentanyl, and methadone. Morphine should not be used.  Avoid Baclofen and avoid oral sodium phosphate and magnesium citrate based laxatives / bowel preps.  Continue strict Input and Output monitoring.  Will monitor the patient closely with you and intervene or adjust therapy as indicated by changes in clinical status/labs   Acute metabolic encephalopathy - likely multifactorial with hypercarbia, opiate accumulation as well as lyrica in setting of ARF. AGMA - likely due to metformin and ARF.  Continue with isotonic bicarb infusion.  Acute hypercarbic and hypoxic respiratory failure - likely due to OHS and opiate accumulation. Hypotension - likely volume depletion given low FeNa.  Agree with IVF's and will continue to follow.  Currently on  levophed gtt.  ECHO with EF 40-45%, unable to determine diastolic parameters. NICM - no evidence of volume overload as above.  Awaiting ECHO Hypovolemic hyponatremia - continue to follow with IVF's Myoclonic jerking - likely due to accumulation of lyrica in setting of ARF.  Continue to follow.     Irena Cords, MD [facility]

## 2022-01-15 NOTE — Progress Notes (Signed)
ANTICOAGULATION CONSULT NOTE  Pharmacy Consult for Heparin Indication: atrial fibrillation Brief A/P: aPTT subtherapeutic Increase Heparin rate  Allergies  Allergen Reactions   Other Itching    Pain medication/ Must take brand named CAN NOT take generic    Patient Measurements: Height: 6\' 1"  (185.4 cm) Weight: (!) 166.8 kg (367 lb 11.6 oz) IBW/kg (Calculated) : 79.9 Heparin Dosing Weight: 120 kg  Vital Signs: Temp: 97.5 F (36.4 C) (08/10 0419) Temp Source: Bladder (08/10 0000) BP: 106/76 (08/10 0400) Pulse Rate: 67 (08/10 0419)  Labs: Recent Labs    01/13/22 2331 01/14/22 0109 01/14/22 0631 01/14/22 0836 01/14/22 0938 01/14/22 1420 01/14/22 1819 01/14/22 2128 01/15/22 0327  HGB 13.0  --  12.3*  --    < >  --  12.6* 11.9* 11.9*  HCT 40.5  --  37.9*  --    < >  --  37.0* 35.0* 35.8*  PLT 207  --  205  --   --   --   --   --  188  APTT  --   --  29  --   --   --   --   --  56*  LABPROT  --   --  17.1*  --   --   --   --   --   --   INR  --   --  1.4*  --   --   --   --   --   --   HEPARINUNFRC  --   --  >1.10*  --   --   --   --   --  >1.10*  CREATININE 9.94*  --  10.00* 9.97*  --  9.45*  --   --   --   TROPONINIHS 20* 15  --   --   --   --   --   --   --    < > = values in this interval not displayed.     Estimated Creatinine Clearance: 12.8 mL/min (A) (by C-G formula based on SCr of 9.45 mg/dL (H)).   Assessment: 66 y.o. male admitted with AMS and AKI,  h/o Afib and Eliquis on hold, for heparin  Goal of Therapy:  aPTT 66-102 sec Heparin level 0.3-0.7 units/ml Monitor platelets by anticoagulation protocol: Yes   Plan:  Increase Heparin 2150 units/hr Check aPTT in 8 hours   Taylor [name], 66 01/15/2022,4:27 AM

## 2022-01-15 NOTE — Progress Notes (Signed)
CRRT stopped and blood returned for planned MRI trip. Patient tolerated well. VasCath dwelled per MD orders. Family at bedside and updated on plan of care with no remaining questions at this time.  Patient transported to MRI with RN/RT. Drip conversions completed and tubing primed for MRI-compatible tubing with infusion rates remaining the same during MRI scan. Vitals remained stable.

## 2022-01-15 NOTE — Progress Notes (Signed)
ANTICOAGULATION CONSULT NOTE  Pharmacy Consult for Heparin Indication: atrial fibrillation   Allergies  Allergen Reactions   Other Itching    Pain medication/ Must take brand named CAN NOT take generic    Patient Measurements: Height: 6\' 1"  (185.4 cm) Weight: (!) 166.8 kg (367 lb 11.6 oz) IBW/kg (Calculated) : 79.9 Heparin Dosing Weight: 120 kg  Vital Signs: Temp: 97.2 F (36.2 C) (08/10 1100) Temp Source: Bladder (08/10 1100) BP: 117/72 (08/10 1100) Pulse Rate: 62 (08/10 1100)  Labs: Recent Labs    01/13/22 2331 01/14/22 0109 01/14/22 0631 01/14/22 0836 01/14/22 0938 01/14/22 1420 01/14/22 1819 01/14/22 2128 01/15/22 0327 01/15/22 0930 01/15/22 1137  HGB 13.0  --  12.3*  --    < >  --    < > 11.9* 11.9* 11.9*  --   HCT 40.5  --  37.9*  --    < >  --    < > 35.0* 35.8* 35.0*  --   PLT 207  --  205  --   --   --   --   --  188  --   --   APTT  --   --  29  --   --   --   --   --  56*  --  112*  LABPROT  --   --  17.1*  --   --   --   --   --   --   --   --   INR  --   --  1.4*  --   --   --   --   --   --   --   --   HEPARINUNFRC  --   --  >1.10*  --   --   --   --   --  >1.10*  --   --   CREATININE 9.94*  --  10.00* 9.97*  --  9.45*  --   --  7.87*  --   --   TROPONINIHS 20* 15  --   --   --   --   --   --   --   --   --    < > = values in this interval not displayed.     Estimated Creatinine Clearance: 15.4 mL/min (A) (by C-G formula based on SCr of 7.87 mg/dL (H)).   Assessment: 66 y.o. male admitted with AMS and AKI,  h/o Afib and Eliquis on hold, for heparin  aPTT supratherapeutic at 112 seconds on current rate of heparin 2150 units/hr. Will plan to decrease rate.   Goal of Therapy:  aPTT 66-102 sec Heparin level 0.3-0.7 units/ml Monitor platelets by anticoagulation protocol: Yes   Plan:  Decrease Heparin 2000 units/hr Check aPTT in 8 hours  Continue daily heparin levels and aPTT Continue to monitor CBC and s/sx bleeding   2151,  PharmD  PGY1 Pharmacy Resident

## 2022-01-15 NOTE — Progress Notes (Signed)
ANTICOAGULATION CONSULT NOTE  Pharmacy Consult for Heparin Indication: atrial fibrillation   Allergies  Allergen Reactions   Other Itching    Pain medication/ Must take brand named CAN NOT take generic    Patient Measurements: Height: 6\' 1"  (185.4 cm) Weight: (!) 166.8 kg (367 lb 11.6 oz) IBW/kg (Calculated) : 79.9 Heparin Dosing Weight: 120 kg  Vital Signs: Temp: 99.3 F (37.4 C) (08/10 2115) Temp Source: Bladder (08/10 1925) BP: 115/63 (08/10 2100) Pulse Rate: 86 (08/10 2115)  Labs: Recent Labs    01/13/22 2331 01/13/22 2331 01/14/22 0109 01/14/22 0631 01/14/22 0836 01/14/22 1420 01/14/22 1819 01/14/22 2128 01/15/22 0327 01/15/22 0930 01/15/22 1137 01/15/22 1542 01/15/22 2057  HGB 13.0  --   --  12.3*   < >  --    < > 11.9* 11.9* 11.9*  --   --   --   HCT 40.5  --   --  37.9*   < >  --    < > 35.0* 35.8* 35.0*  --   --   --   PLT 207  --   --  205  --   --   --   --  188  --   --   --   --   APTT  --    < >  --  29  --   --   --   --  56*  --  112*  --  110*  LABPROT  --   --   --  17.1*  --   --   --   --   --   --   --   --   --   INR  --   --   --  1.4*  --   --   --   --   --   --   --   --   --   HEPARINUNFRC  --   --   --  >1.10*  --   --   --   --  >1.10*  --   --   --   --   CREATININE 9.94*  --   --  10.00*   < > 9.45*  --   --  7.87*  --   --  6.71*  --   TROPONINIHS 20*  --  15  --   --   --   --   --   --   --   --   --   --    < > = values in this interval not displayed.     Estimated Creatinine Clearance: 18 mL/min (A) (by C-G formula based on SCr of 6.71 mg/dL (H)).   Assessment: 65 y.o. male admitted with AMS and AKI,  h/o Afib and Eliquis on hold, for heparin  PTT came back supratherapeutic again tonight. It was drawn from A-line but separate from heparin. We will decrease level again and check in AM.  Goal of Therapy:  aPTT 66-102 sec Heparin level 0.3-0.7 units/ml Monitor platelets by anticoagulation protocol: Yes   Plan:   Decrease Heparin 1800 units/hr Continue daily heparin levels and aPTT Continue to monitor CBC and s/sx bleeding   78, PharmD, BCIDP, AAHIVP, CPP Infectious Disease Pharmacist 01/15/2022 9:58 PM

## 2022-01-15 NOTE — Progress Notes (Addendum)
Initial Nutrition Assessment  DOCUMENTATION CODES:   Not applicable  INTERVENTION:   Initiate tube feeding via OG tube: Vital High Protein at 20 ml/h (480 ml per day). When able to advance, goal rate is 50 ml/h (1200 ml per day) ProsourceTF20 60 ml TID  At goal rate, TF regimen provides 1440 kcal (2628 kcal with propofol), 165 gm protein, 1003 ml free water daily.  NUTRITION DIAGNOSIS:   Inadequate oral intake related to inability to eat as evidenced by NPO status.  GOAL:   Patient will meet greater than or equal to 90% of their needs  MONITOR:   TF tolerance, Vent status, Labs  REASON FOR ASSESSMENT:   Ventilator, Consult Enteral/tube feeding initiation and management  ASSESSMENT:   66 yo male admitted with shock, AKI. PMH includes HTN, DM-2, CAD, NICM, A fib, OSA, chronic back pain.  Discussed patient in ICU rounds and with RN today. Currently requiring CRRT. Hopeful for extubation tomorrow.   OG tube in place with tip in the stomach per x-ray. Received MD Consult for TF initiation and management. Currently receiving Vital High Protein at 20 ml/h. Going for MRI today.  Patient is currently intubated on ventilator support MV: 14.2 L/min Temp (24hrs), Avg:98.1 F (36.7 C), Min:97.2 F (36.2 C), Max:99.1 F (37.3 C)  Propofol: 45 ml/hr providing 1188 kcal from lipid  Labs reviewed. Phos 7.3 CBG: 120-108-106  Medications reviewed and include Colace, Novolog, Miralax, Levophed, propofol.  Weight history reviewed. No significant weight changes noted.   NUTRITION - FOCUSED PHYSICAL EXAM:  Flowsheet Row Most Recent Value  Orbital Region No depletion  Upper Arm Region No depletion  Thoracic and Lumbar Region No depletion  Buccal Region No depletion  Temple Region No depletion  Clavicle Bone Region No depletion  Clavicle and Acromion Bone Region No depletion  Scapular Bone Region Unable to assess  Dorsal Hand No depletion  Patellar Region No depletion   Anterior Thigh Region No depletion  Posterior Calf Region No depletion  Edema (RD Assessment) Severe  Hair Reviewed  Eyes Unable to assess  Mouth Unable to assess  Skin Reviewed  Nails Reviewed       Diet Order:   Diet Order             Diet NPO time specified  Diet effective now                   EDUCATION NEEDS:   Not appropriate for education at this time  Skin:  Skin Assessment: Reviewed RN Assessment  Last BM:  PTA  Height:   Ht Readings from Last 1 Encounters:  01/13/22 6\' 1"  (1.854 m)    Weight:   Wt Readings from Last 1 Encounters:  01/15/22 (!) 166.8 kg    Ideal Body Weight:  83.6 kg  BMI:  Body mass index is 48.52 kg/m.  Estimated Nutritional Needs:   Kcal:  2400-2600  Protein:  165-175 gm  Fluid:  2.4-2.6 L   03/17/22 RD, LDN, CNSC Please refer to Amion for contact information.

## 2022-01-16 DIAGNOSIS — J9602 Acute respiratory failure with hypercapnia: Secondary | ICD-10-CM | POA: Diagnosis not present

## 2022-01-16 DIAGNOSIS — N179 Acute kidney failure, unspecified: Secondary | ICD-10-CM | POA: Diagnosis not present

## 2022-01-16 LAB — CBC
HCT: 35.6 % — ABNORMAL LOW (ref 39.0–52.0)
Hemoglobin: 12 g/dL — ABNORMAL LOW (ref 13.0–17.0)
MCH: 28.2 pg (ref 26.0–34.0)
MCHC: 33.7 g/dL (ref 30.0–36.0)
MCV: 83.8 fL (ref 80.0–100.0)
Platelets: 170 10*3/uL (ref 150–400)
RBC: 4.25 MIL/uL (ref 4.22–5.81)
RDW: 15.9 % — ABNORMAL HIGH (ref 11.5–15.5)
WBC: 7.2 10*3/uL (ref 4.0–10.5)
nRBC: 0 % (ref 0.0–0.2)

## 2022-01-16 LAB — RENAL FUNCTION PANEL
Albumin: 2.5 g/dL — ABNORMAL LOW (ref 3.5–5.0)
Albumin: 2.4 g/dL — ABNORMAL LOW (ref 3.5–5.0)
Anion gap: 10 (ref 5–15)
Anion gap: 11 (ref 5–15)
BUN: 33 mg/dL — ABNORMAL HIGH (ref 8–23)
BUN: 29 mg/dL — ABNORMAL HIGH (ref 8–23)
CO2: 22 mmol/L (ref 22–32)
CO2: 23 mmol/L (ref 22–32)
Calcium: 7.3 mg/dL — ABNORMAL LOW (ref 8.9–10.3)
Calcium: 8 mg/dL — ABNORMAL LOW (ref 8.9–10.3)
Chloride: 102 mmol/L (ref 98–111)
Chloride: 102 mmol/L (ref 98–111)
Creatinine, Ser: 4.33 mg/dL — ABNORMAL HIGH (ref 0.61–1.24)
Creatinine, Ser: 3.58 mg/dL — ABNORMAL HIGH (ref 0.61–1.24)
GFR, Estimated: 14 mL/min — ABNORMAL LOW (ref >60)
GFR, Estimated: 18 mL/min — ABNORMAL LOW (ref >60)
Glucose, Bld: 146 mg/dL — ABNORMAL HIGH (ref 70–99)
Glucose, Bld: 178 mg/dL — ABNORMAL HIGH (ref 70–99)
Phosphorus: 3.6 mg/dL (ref 2.5–4.6)
Phosphorus: 3.3 mg/dL (ref 2.5–4.6)
Potassium: 3.9 mmol/L (ref 3.5–5.1)
Potassium: 4.1 mmol/L (ref 3.5–5.1)
Sodium: 134 mmol/L — ABNORMAL LOW (ref 135–145)
Sodium: 136 mmol/L (ref 135–145)

## 2022-01-16 LAB — TRIGLYCERIDES: Triglycerides: 117 mg/dL (ref <150)

## 2022-01-16 LAB — GLUCOSE, CAPILLARY
Glucose-Capillary: 142 mg/dL — ABNORMAL HIGH (ref 70–99)
Glucose-Capillary: 181 mg/dL — ABNORMAL HIGH (ref 70–99)
Glucose-Capillary: 153 mg/dL — ABNORMAL HIGH (ref 70–99)
Glucose-Capillary: 167 mg/dL — ABNORMAL HIGH (ref 70–99)
Glucose-Capillary: 162 mg/dL — ABNORMAL HIGH (ref 70–99)
Glucose-Capillary: 155 mg/dL — ABNORMAL HIGH (ref 70–99)

## 2022-01-16 LAB — APTT
aPTT: 71 seconds — ABNORMAL HIGH (ref 24–36)
aPTT: 87 seconds — ABNORMAL HIGH (ref 24–36)

## 2022-01-16 LAB — HEPARIN LEVEL (UNFRACTIONATED)
Heparin Unfractionated: 0.67 IU/mL (ref 0.30–0.70)
Heparin Unfractionated: 0.61 IU/mL (ref 0.30–0.70)

## 2022-01-16 LAB — PROCALCITONIN: Procalcitonin: 0.62 ng/mL

## 2022-01-16 LAB — MAGNESIUM: Magnesium: 2.3 mg/dL (ref 1.7–2.4)

## 2022-01-16 MED ORDER — SENNOSIDES 8.8 MG/5ML PO SYRP
5.0000 mL | ORAL_SOLUTION | Freq: Every day | ORAL | Status: DC
Start: 2022-01-16 — End: 2022-01-17
  Administered 2022-01-16 – 2022-01-17 (×2): 5 mL
  Filled 2022-01-16 (×2): qty 5

## 2022-01-16 MED ORDER — SODIUM CHLORIDE 0.9% FLUSH: 10.0000 mL | INTRAVENOUS | Status: DC | PRN

## 2022-01-16 MED ORDER — OXYCODONE HCL 5 MG PO TABS
10.0000 mg | ORAL_TABLET | Freq: Four times a day (QID) | ORAL | Status: DC
  Administered 2022-01-16 – 2022-01-17 (×5): 10 mg
  Filled 2022-01-16 (×5): qty 2

## 2022-01-16 MED ORDER — APIXABAN 5 MG PO TABS
5.0000 mg | ORAL_TABLET | Freq: Two times a day (BID) | ORAL | Status: DC
  Administered 2022-01-16 – 2022-01-17 (×2): 5 mg
  Filled 2022-01-16 (×2): qty 1

## 2022-01-16 MED ORDER — DEXMEDETOMIDINE HCL IN NACL 400 MCG/100ML IV SOLN
0.4000 ug/kg/h | INTRAVENOUS | Status: DC
  Administered 2022-01-16 – 2022-01-17 (×2): 0.4 ug/kg/h via INTRAVENOUS
  Administered 2022-01-17: 0.6 ug/kg/h via INTRAVENOUS
  Filled 2022-01-16 (×3): qty 100

## 2022-01-16 MED ORDER — SODIUM CHLORIDE 0.9% FLUSH
10.0000 mL | Freq: Two times a day (BID) | INTRAVENOUS | Status: DC
  Administered 2022-01-16 – 2022-01-18 (×5): 10 mL

## 2022-01-16 NOTE — Progress Notes (Signed)
Patient noted to be tachycardic and hypertensive this morning. Felt his may be related to withdrawal from home medications. He was started on oxycodone and precedex and his fentanyl was increased to 75. Previoiusly requiring 30mcg/min Levo which was stopped.   Blood pressure and heart rate noted to drop this afternoon with SBP 70s, MAPs 50. His precedex was turned off, fentanyl decreased to 50, restarted on Levo.   Patient remains somnolent, but awakens to voice and responds appropriately. Denied any cardiac symptoms.  Patient is likely very sensitive to these medications and would titrate these with caution. He has remained hemodynamically stable on 50 of fentanyl and 2 of Levo. Off precedex.

## 2022-01-16 NOTE — TOC Progression Note (Signed)
Transition of Care [facility]) - Progression Note    Patient Details  Name: JOIE REAMER MRN: [id number] Date of Birth: 11/21/1955  Transition of Care [facility]) CM/SW Contact  Beckie Busing, RN Phone [phone]  01/16/2022, 2:32 PM  Clinical Narrative:     Transition of Care [facility]) Screening Note   Patient Details  Name: OZIEL BEITLER Date of Birth: 25-Jun-1955   Transition of Care [facility]) CM/SW Contact:    Beckie Busing, RN Phone Number: 01/16/2022, 2:32 PM    Transition of Care Department [facility]) has reviewed patient and no TOC needs have been identified at this time. We will continue to monitor patient advancement through interdisciplinary progression rounds.           Expected Discharge Plan and Services                                                 Social Determinants of Health (SDOH) Interventions    Readmission Risk Interventions     No data to display

## 2022-01-16 NOTE — Progress Notes (Signed)
NAME:  Taylor [name], MRN:  [id number], DOB:  01-19-1956, LOS: 2 ADMISSION DATE:  01/13/2022, CONSULTATION DATE:  01/14/22 REFERRING MD:  [name] , CHIEF COMPLAINT:  AMS    History of Present Illness:  66 yo man with HTN, CHF ef 35-40%, HLD, afib on eliquis, DM2, obesity, back pain (on ambien, oxycodone, dilaudid, lyrica.  Presenting with AMS, new renal failure.   Recently returned from [facility]. Since then has been excessively sleepy and fatigued with associated nausea. This is rapid change from basleine. Last took home meds yesterday 5pm. Denies pain. Arousable, only answers simple questions.  Falls asleep.    Transferred to [name] ED (no ICU beds).    In ed found to have B LE edema.   Hypotensive. Given LR 500cc  x 2.  Levophed.   Pertinent  Medical History   Past Medical History:  Diagnosis Date   Arthritis    Broken leg 02/07/2019   Depression    Diabetes mellitus without complication ([place])    Hypertension    No current facility-administered medications on file prior to encounter.   Current Outpatient Medications on File Prior to Encounter  Medication Sig Dispense Refill   apixaban (ELIQUIS) 5 MG TABS tablet TAKE 1 TABLET BY MOUTH TWICE DAILY 60 tablet 5   dapagliflozin propanediol (FARXIGA) 10 MG TABS tablet Take 1 tablet (10 mg total) by mouth daily before breakfast. 21 tablet 0   glipiZIDE (GLUCOTROL XL) 10 MG 24 hr tablet TAKE 1 TABLET BY MOUTH ONCE DAILY. 90 tablet 4   glucose blood (ONETOUCH ULTRA) test strip Use to check sugar daily for type 2 diabetes E11.9 100 each 4   HYDROmorphone (DILAUDID) 4 MG tablet Take 4 mg by mouth in the morning, at noon, in the evening, and at bedtime. Must be name brand     lisinopril (ZESTRIL) 10 MG tablet TAKE 1 TABLET BY MOUTH ONCE DAILY. 90 tablet 0   metFORMIN (GLUCOPHAGE) 1000 MG tablet TAKE 1 TABLET BY MOUTH TWICE DAILY 180 tablet 4   metoprolol (TOPROL XL) 200 MG 24 hr tablet Take 1 tablet (200 mg total) by mouth daily. 30 tablet 5    MOVANTIK 25 MG TABS tablet      NARCAN 4 MG/0.1ML LIQD nasal spray kit Place 0.4 mg into the nose as needed (accidental overdose).     OXYCONTIN 20 MG 12 hr tablet Take 20 mg by mouth in the morning and at bedtime. Take 20 mg by mouth four times a day     pioglitazone (ACTOS) 45 MG tablet TAKE 1 TABLET BY MOUTH ONCE DAILY. 90 tablet 4   potassium chloride SA (KLOR-CON) 20 MEQ tablet Take 1 tablet (20 mEq total) by mouth daily. 90 tablet 1   pregabalin (LYRICA) 100 MG capsule Take 100 mg by mouth 3 (three) times daily.     simvastatin (ZOCOR) 40 MG tablet TAKE 1 TABLET BY MOUTH ONCE DAILY. 90 tablet 0   torsemide (DEMADEX) 20 MG tablet Take 40 MG (2 tabs) every AM, and Take 20 MG (1 tab) every PM. 90 tablet 3   vitamin B-12 (CYANOCOBALAMIN) 1000 MCG tablet Take 1,000 mcg by mouth daily.     zolpidem (AMBIEN CR) 12.5 MG CR tablet Take 12.5 mg by mouth at bedtime.        Significant Hospital Events: Including procedures, antibiotic start and stop dates in addition to other pertinent events   8/9 admitted to ICU 8/9 intubated and HD cath placed after witnessed  myoclonic jerking 8/11 precedex and oxycodone   Interim History / Subjective:  Intubated, sedated.  Objective   Blood pressure (!) 153/68, pulse (!) 116, temperature 99.1 F (37.3 C), temperature source Oral, resp. rate 12, height 6' 1" (1.854 m), weight (!) 165.3 kg, SpO2 100 %.    Vent Mode: PSV;CPAP FiO2 (%):  [40 %-50 %] 40 % Set Rate:  [24 bmp] 24 bmp Vt Set:  [550 mL] 550 mL PEEP:  [8 cmH20] 8 cmH20 Pressure Support:  [5 cmH20] 5 cmH20 Plateau Pressure:  [18 cmH20-21 cmH20] 21 cmH20   Intake/Output Summary (Last 24 hours) at 01/16/2022 1128 Last data filed at 01/16/2022 1000 Gross per 24 hour  Intake 2794.61 ml  Output 3573 ml  Net -778.39 ml    Filed Weights   01/14/22 0651 01/15/22 0350 01/16/22 0252  Weight: (!) 165.7 kg (!) 166.8 kg (!) 165.3 kg   Resolved Hospital Problem list     Assessment & Plan:    Acute encephalopathy Likely due to hypercarbia and opiate accumulation in the setting of renal failure. Mild uremia. - continue supportive care - start precedex for withdrawal sx - fentanyl increased to 75 - start on oxycodone  Generalized tonic clonic seizure Likely related to accumulation of Lyrica and uremia. CRRT since BP has been low. Intubated 8/9 for airway protection.  Sedated. MRI head negative for acute intracranial pathology. EEG negative for seizure. - Depakote discontinued - neurology following - wean off propofol - start precedex and oxycodone  Acute hypercarbic respiratory failure Patient may have OHS, but VBG demonstrates more acute decompensation/acidosis. Respiratory depression and decompensation likely due to opiate accumulation. CT chest demonstrated no infiltrate or volume overload. Intubated 8/9 to maintain airway s/p myoclonic jerking/Sz. - wean propofol today and attempt SAT/SBT - wean to BiPAP if able to extubate today - BiPAP QHS and PRN once extubated  ? CAP Initially noted to have infiltrate vs edema on CXR, but follow up CT negative.  Patient has been afebrile with no leukocytosis.   - Complete day 3 of CTX today.   Hypotension Favor hypovolemia due todehydration, though cardiogenic is possible. Not septic. Baseline weight appears to be around 175kg, weight on admission 165 kg. Hypovolemic hyponatremia would also fit this picture. Repeat ECHO showing EF 40-45% - wean off Levo   HFrEF Patient does not appear to be too volume overloaded at this time, though exam is limited by body habitus. Repeat echo 8/9 showing EF 40-45%. CXR showing possible early edema vs infiltrate.  - holding diuretics in the setting of renal failure - can restart metoprolol at 50 if BP stable off Levo  Afib Takes Eliquis at home. holding Eliquis for possible procedures. - continue Heparin - start home eliquis once extubated and taking PO  Acute combined metabolic and  respiratory acidosis Hypercarbia and renal failure.  - bicarb stopped  Acute renal failure  ATN Uremia Baseline Cr 1.0, Currently 10 with mild uremia. Minimal urine output. No obstruction CRRT initiated after witnessed generalized myoclonic seizure thought related to medications. - gentle fluids - strict I+Os, daily weights - daily BMP - avoid nephrotoxic medications - Stop CRRT today at 12.   Hyponatremia May be hypovolemic in the setting of dehydration.   DM2 Glucose has been stable. He takes home glipizide which may have prolonged effect in the setting of his renal failure - Q4 CBG - SSI -tube feeds for now. PO as able.   Best Practice (right click and "Reselect all SmartList Selections"   daily)   Diet/type: NPO DVT prophylaxis: systemic heparin (for Afib, normally on eliquis but may need procedures).   GI prophylaxis: PPI Lines: N/A Foley:  present Code Status:  full code Last date of multidisciplinary goals of care discussion []  Delene Ruffini, MD  Critical care time: 45 min

## 2022-01-16 NOTE — Progress Notes (Signed)
Helping ICU RT, came to room for vent check and noted pt HR 170's.  Notified RN and ICU RT.  Pt was placed back on full vent support.

## 2022-01-16 NOTE — Progress Notes (Signed)
Subjective:   ROS: Unable to obtain due to poor mental status  Examination  Vital signs in last 24 hours: Temp:  [96.3 F (35.7 C)-99.7 F (37.6 C)] 99.7 F (37.6 C) (08/11 0743) Pulse Rate:  [56-120] 113 (08/11 0745) Resp:  [16-25] 24 (08/11 0745) BP: (93-153)/(49-88) 153/68 (08/11 0700) SpO2:  [95 %-100 %] 100 % (08/11 0811) Arterial Line BP: (87-165)/(49-81) 128/63 (08/11 0745) FiO2 (%):  [40 %-50 %] 40 % (08/11 0811) Weight:  [165.3 kg] 165.3 kg (08/11 0252)  General: lying in bed, NAD CVS: pulse-normal rate and rhythm RS: Intubated Extremities: + edema   Neuro: Winces to noxious stimuli but does not open eyes, PERRLA, corneal reflex intact, gag reflex intact, winces to noxious stimuli in bilateral lower extremity, withdraws to noxious stimuli with antigravity strength in bilateral upper extremities  Basic Metabolic Panel: Recent Labs  Lab 01/14/22 0631 01/14/22 0836 01/14/22 0938 01/14/22 1420 01/14/22 1819 01/14/22 2030 01/14/22 2128 01/15/22 0327 01/15/22 0930 01/15/22 1542 01/16/22 0358  NA 132* 132*   < > 132*   < >  --  131* 132* 135 135 134*  K 4.9  --    < > 5.0   < >  --  4.0 3.7 3.6 3.3* 3.9  CL 94*  --   --  96*  --   --   --  96*  --  99 102  CO2 21*  --   --  19*  --   --   --  20*  --  20* 22  GLUCOSE 144*  --   --  134*  --   --   --  108*  --  144* 146*  BUN 66*  --   --  67*  --   --   --  56*  --  48* 33*  CREATININE 10.00* 9.97*  --  9.45*  --   --   --  7.87*  --  6.71* 4.33*  CALCIUM 7.6*  --   --  7.0*  --   --   --  7.4*  --  7.2* 7.3*  MG  --   --   --   --   --  2.3  --  2.1  --   --  2.3  PHOS  --   --   --   --   --   --   --  7.3*  --  5.2* 3.6   < > = values in this interval not displayed.    CBC: Recent Labs  Lab 01/13/22 2331 01/14/22 0631 01/14/22 0938 01/14/22 1819 01/14/22 2128 01/15/22 0327 01/15/22 0930 01/16/22 0358  WBC 8.8 9.6  --   --   --  8.3  --  7.2  NEUTROABS 5.8 6.4  --   --   --   --   --   --   HGB  13.0 12.3*   < > 12.6* 11.9* 11.9* 11.9* 12.0*  HCT 40.5 37.9*   < > 37.0* 35.0* 35.8* 35.0* 35.6*  MCV 88.0 88.1  --   --   --  85.6  --  83.8  PLT 207 205  --   --   --  188  --  170   < > = values in this interval not displayed.     Coagulation Studies: Recent Labs    01/14/22 0631  LABPROT 17.1*  INR 1.4*    Imaging MR Brain wo contrast 01/15/2022:  1. Intermittently motion degraded examination. 2. No evidence of acute intracranial abnormality. 3. No specific seizure focus is identified. 4. Mild chronic small-vessel ischemic changes within the cerebral white matter and pons. 5. Mild cerebral atrophy. 6. Bilateral ethmoid sinusitis. 7. Trace bilateral mastoid effusions.   ASSESSMENT AND PLAN:  66 year old male presented with altered mental status in the setting of AKI.  He was noted to have some myoclonic jerking which was attributed to Lyrica use in setting of renal failure.  However, on 01/14/2022 he had another episode concerning for seizure and was subsequently intubated.   Seizure-like activity AKI -It is unclear if the event patient had was a seizure or myoclonic jerking. -Etiology for seizure-like activity: No evidence of infection, family denies alcohol use, not on any specific medications that could lower seizure threshold.   Recommendations -As it his unclear if patient had seizure or not and even if he did, it was likely provoked seizure in the setting elevated BUN I am holding pregabalin, will stop depakote for now. -If patient has any further seizure-like activity, we can resume Depakote and at that point he will be discharged on Depakote -Continue to wean sedation as appropriate per ICU team -As needed IV Ativan 2 mg for clinical seizure-like activity -Management of rest of comorbidities per primary team -Discussed plan with critical care team and nurse at bedside -Neurology will follow peripherally.  Please call us for any further questions.   I have spent a  total of  38  minutes with the patient reviewing hospital notes,  test results, labs and examining the patient as well as establishing an assessment and plan.  > 50% of time was spent in direct patient care.    Lindie Spruce Epilepsy [facility] For questions after 5pm please refer to AMION to reach the Neurologist on call

## 2022-01-16 NOTE — Progress Notes (Signed)
ANTICOAGULATION CONSULT NOTE   Pharmacy Consult for Heparin Indication: atrial fibrillation   Allergies  Allergen Reactions   Other Itching    Pain medication/ Must take brand named CAN NOT take generic    Patient Measurements: Height: 6\' 1"  (185.4 cm) Weight: (!) 165.3 kg (364 lb 6.7 oz) IBW/kg (Calculated) : 79.9 Heparin Dosing Weight: 120 kg  Vital Signs: Temp: 99.4 F (37.4 C) (08/11 0330) Temp Source: Oral (08/11 0330) BP: 93/68 (08/11 0300) Pulse Rate: 91 (08/11 0330)  Labs: Recent Labs    01/13/22 2331 01/13/22 2331 01/14/22 0109 01/14/22 0631 01/14/22 0836 01/14/22 1420 01/14/22 1819 01/15/22 0327 01/15/22 0930 01/15/22 1137 01/15/22 1542 01/15/22 2057 01/16/22 0358  HGB 13.0  --   --  12.3*   < >  --    < > 11.9* 11.9*  --   --   --  12.0*  HCT 40.5  --   --  37.9*   < >  --    < > 35.8* 35.0*  --   --   --  35.6*  PLT 207  --   --  205  --   --   --  188  --   --   --   --  170  APTT  --    < >  --  29  --   --   --  56*  --  112*  --  110* 71*  LABPROT  --   --   --  17.1*  --   --   --   --   --   --   --   --   --   INR  --   --   --  1.4*  --   --   --   --   --   --   --   --   --   HEPARINUNFRC  --   --   --  >1.10*  --   --   --  >1.10*  --   --   --   --   --   CREATININE 9.94*  --   --  10.00*   < > 9.45*  --  7.87*  --   --  6.71*  --   --   TROPONINIHS 20*  --  15  --   --   --   --   --   --   --   --   --   --    < > = values in this interval not displayed.     Estimated Creatinine Clearance: 17.9 mL/min (A) (by C-G formula based on SCr of 6.71 mg/dL (H)).   Assessment: 66 y.o. [name] admitted with AMS and AKI,  h/o Afib and Eliquis on hold, for heparin  8/11 AM update:  aPTT therapeutic after rate decrease  Goal of Therapy:  aPTT 66-102 sec Heparin level 0.3-0.7 units/ml Monitor platelets by anticoagulation protocol: Yes   Plan:  Cont heparin at 1800 units/hr Heparin level and aPTT at 1200  10/11, PharmD,  BCPS Clinical Pharmacist Phone: [phone]'

## 2022-01-16 NOTE — Progress Notes (Signed)
Patient ID: Taylor [name], male   DOB: 20-Mar-1956, 66 y.o.   MRN: [id number] S: No events overnight.  UOP significantly improved. O:BP (!) 153/68   Pulse (!) 113   Temp 99.7 F (37.6 C) (Oral)   Resp (!) 24   Ht 6\' 1"  (1.854 m)   Wt (!) 165.3 kg   SpO2 100%   BMI 48.08 kg/m   Intake/Output Summary (Last 24 hours) at 01/16/2022 1000 Last data filed at 01/16/2022 0700 Gross per 24 hour  Intake 2883.67 ml  Output 4068 ml  Net -1184.33 ml   Intake/Output: I/O last 3 completed shifts: In: 4466.1 [I.V.:2709.2; Other:325; NG/GT:813; IV Piggyback:618.9] Out: 6201 [Urine:3050; Emesis/NG output:800]  Intake/Output this shift:  No intake/output data recorded. Weight change: -1.5 kg 03/18/2022 and sedated CVS: tachy  Resp:ventilated BS bilaterally Abd: obese, +BS, soft Ext: 1+ edema BLE  Recent Labs  Lab 01/13/22 2331 01/14/22 0631 01/14/22 0836 01/14/22 0938 01/14/22 1420 01/14/22 1819 01/14/22 2128 01/15/22 0327 01/15/22 0930 01/15/22 1542 01/16/22 0358  NA 132* 132* 132*   < > 132* 132* 131* 132* 135 135 134*  K 4.4 4.9  --    < > 5.0 4.2 4.0 3.7 3.6 3.3* 3.9  CL 92* 94*  --   --  96*  --   --  96*  --  99 102  CO2 22 21*  --   --  19*  --   --  20*  --  20* 22  GLUCOSE 153* 144*  --   --  134*  --   --  108*  --  144* 146*  BUN 69* 66*  --   --  67*  --   --  56*  --  48* 33*  CREATININE 9.94* 10.00* 9.97*  --  9.45*  --   --  7.87*  --  6.71* 4.33*  ALBUMIN 3.5 3.1*  --   --   --   --   --  2.7*  --  2.4* 2.5*  CALCIUM 7.8* 7.6*  --   --  7.0*  --   --  7.4*  --  7.2* 7.3*  PHOS  --   --   --   --   --   --   --  7.3*  --  5.2* 3.6  AST 21 18  --   --   --   --   --   --   --   --   --   ALT 18 15  --   --   --   --   --   --   --   --   --    < > = values in this interval not displayed.   Liver Function Tests: Recent Labs  Lab 01/13/22 2331 01/14/22 0631 01/15/22 0327 01/15/22 1542 01/16/22 0358  AST 21 18  --   --   --   ALT 18 15  --   --   --    ALKPHOS 83 71  --   --   --   BILITOT 0.7 0.6  --   --   --   PROT 7.8 6.9  --   --   --   ALBUMIN 3.5 3.1* 2.7* 2.4* 2.5*   Recent Labs  Lab 01/13/22 2331  LIPASE 25   No results for input(s): "AMMONIA" in the last 168 hours. CBC: Recent Labs  Lab 01/13/22 2331 01/14/22 0631 01/14/22 03/16/22 01/15/22  0327 01/15/22 0930 01/16/22 0358  WBC 8.8 9.6  --  8.3  --  7.2  NEUTROABS 5.8 6.4  --   --   --   --   HGB 13.0 12.3*   < > 11.9* 11.9* 12.0*  HCT 40.5 37.9*   < > 35.8* 35.0* 35.6*  MCV 88.0 88.1  --  85.6  --  83.8  PLT 207 205  --  188  --  170   < > = values in this interval not displayed.   Cardiac Enzymes: No results for input(s): "CKTOTAL", "CKMB", "CKMBINDEX", "TROPONINI" in the last 168 hours. CBG: Recent Labs  Lab 01/15/22 1544 01/15/22 1927 01/15/22 2323 01/16/22 0357 01/16/22 0739  GLUCAP 143* 141* 130* 142* 181*    Iron Studies: No results for input(s): "IRON", "TIBC", "TRANSFERRIN", "FERRITIN" in the last 72 hours. Studies/Results: MR BRAIN WO CONTRAST  Result Date: 01/15/2022 CLINICAL DATA:  Provided history: Seizure disorder, clinical change. EXAM: MRI HEAD WITHOUT CONTRAST TECHNIQUE: Multiplanar, multiecho pulse sequences of the brain and surrounding structures were obtained without intravenous contrast. COMPARISON:  Head CT 01/14/2022. FINDINGS: Mild-to-moderate intermittent motion degradation. Brain: Mild cerebral atrophy without definite lobar predominance. Small chronic lacunar infarct within the right external capsule (series 12, image 17). Prominent perivascular space within the left basal ganglia. Mild multifocal T2 FLAIR hyperintense signal abnormality within the cerebral white matter and pons, nonspecific but compatible chronic small vessel ischemic disease. No cortical encephalomalacia is identified. No appreciable hippocampal size or signal asymmetry. There is no acute infarct. No evidence of an intracranial mass. No chronic intracranial blood  products. No extra-axial fluid collection. No midline shift. Vascular: Maintained flow voids within the proximal large arterial vessels. Skull and upper cervical spine: No focal suspicious marrow lesion. Sinuses/Orbits: No mass or acute finding within the imaged orbits. Mild mucosal thickening, and small-volume fluid, within the bilateral ethmoid sinuses. Other: Trace fluid within the bilateral mastoid air cells. IMPRESSION: 1. Intermittently motion degraded examination. 2. No evidence of acute intracranial abnormality. 3. No specific seizure focus is identified. 4. Mild chronic small-vessel ischemic changes within the cerebral white matter and pons. 5. Mild cerebral atrophy. 6. Bilateral ethmoid sinusitis. 7. Trace bilateral mastoid effusions. Electronically Signed   By: Jackey Loge D.O.   On: 01/15/2022 13:47   DG CHEST PORT 1 VIEW  Result Date: 01/14/2022 CLINICAL DATA:  Central line placement. EXAM: PORTABLE CHEST 1 VIEW COMPARISON:  January 14, 2022 FINDINGS: Right IJ approach central venous catheter terminates at the expected location of the distal superior vena cava. There is no evidence of pneumothorax. Endotracheal tube terminates 5.5 cm superior to the carina. Enteric catheter collimated off the image. Enlarged cardiac silhouette. Streaky airspace opacities in bilateral hilar regions. Possible small right pleural effusion. IMPRESSION: 1. Right IJ approach central venous catheter terminates at the expected location of the distal superior vena cava. No evidence of pneumothorax. 2. Endotracheal tube 5.5 cm superior to the carina. Advancement may be considered. 3. Streaky airspace opacities in bilateral hilar regions. Question developing interstitial pulmonary edema. Electronically Signed   By: Ted Mcalpine M.D.   On: 01/14/2022 18:57   ECHOCARDIOGRAM COMPLETE  Result Date: 01/14/2022    ECHOCARDIOGRAM REPORT   Patient Name:   Taylor [name] Date of Exam: 01/14/2022 Medical Rec #:  [id number]          Height:       73.0 in Accession #:    [id number]        Weight:  365.3 lb Date of Birth:  21-Feb-1956        BSA:          2.779 m Patient Age:    65 years          BP:           89/60 mmHg Patient Gender: M                 HR:           105 bpm. Exam Location:  Inpatient Procedure: 2D Echo, Cardiac Doppler, Color Doppler and Intracardiac            Opacification Agent Indications:     I50.9* Heart failure (unspecified)  History:         Patient has prior history of Echocardiogram examinations, most                  recent 03/03/2021. CHF; Risk Factors:Hypertension and Diabetes.  Sonographer:     Gertie Fey RDMS, RVT, RDCS Referring Phys:  [id number] Charlotte Sanes Diagnosing Phys: Epifanio Lesches MD IMPRESSIONS  1. Technically difficult study, very limited views even with contrast administration  2. Left ventricular ejection fraction, by estimation, is grossly 40 to 45%. The left ventricle has mildly decreased function. Left ventricular endocardial border not optimally defined to evaluate regional wall motion. Left ventricular diastolic parameters are indeterminate.  3. Right ventricule is not well visualized but grossly normal size and systolic function. There is normal pulmonary artery systolic pressure.  4. The mitral valve is grossly normal. Trivial mitral valve regurgitation.  5. The aortic valve was not well visualized. Aortic valve regurgitation is not visualized. No aortic stenosis is present.  6. The inferior vena cava is dilated in size with <50% respiratory variability, suggesting right atrial pressure of 15 mmHg. FINDINGS  Left Ventricle: Left ventricular ejection fraction, by estimation, is 40 to 45%. The left ventricle has mildly decreased function. Left ventricular endocardial border not optimally defined to evaluate regional wall motion. Definity contrast agent was given IV to delineate the left ventricular endocardial borders. The left ventricular internal cavity size was normal in  size. There is no left ventricular hypertrophy. Left ventricular diastolic parameters are indeterminate. Right Ventricle: The right ventricular size is not well visualized. Right vetricular wall thickness was not well visualized. Right ventricular systolic function was not well visualized. There is normal pulmonary artery systolic pressure. The tricuspid regurgitant velocity is 2.14 m/s, and with an assumed right atrial pressure of 15 mmHg, the estimated right ventricular systolic pressure is 33.3 mmHg. Left Atrium: Left atrial size was not well visualized. Right Atrium: Right atrial size was not well visualized. Pericardium: Trivial pericardial effusion is present. Mitral Valve: The mitral valve is grossly normal. Trivial mitral valve regurgitation. Tricuspid Valve: The tricuspid valve is normal in structure. Tricuspid valve regurgitation is trivial. Aortic Valve: The aortic valve was not well visualized. Aortic valve regurgitation is not visualized. No aortic stenosis is present. Pulmonic Valve: The pulmonic valve was not well visualized. Pulmonic valve regurgitation is not visualized. Aorta: The aortic root is normal in size and structure. Venous: The inferior vena cava is dilated in size with less than 50% respiratory variability, suggesting right atrial pressure of 15 mmHg. IAS/Shunts: The interatrial septum was not well visualized.  LEFT VENTRICLE PLAX 2D LVIDd:         5.40 cm   Diastology LVIDs:         4.00 cm   LV e' lateral:  13.10 cm/s LV PW:         0.80 cm LV IVS:        0.90 cm LVOT diam:     2.00 cm LV SV:         30 LV SV Index:   11 LVOT Area:     3.14 cm  LEFT ATRIUM         Index LA diam:    4.30 cm 1.55 cm/m  AORTIC VALVE LVOT Vmax:   58.80 cm/s LVOT Vmean:  38.200 cm/s LVOT VTI:    0.096 m  AORTA Ao Root diam: 3.00 cm TRICUSPID VALVE TR Peak grad:   18.3 mmHg TR Vmax:        214.00 cm/s  SHUNTS Systemic VTI:  0.10 m Systemic Diam: 2.00 cm Epifanio Lesches MD Electronically signed by  Epifanio Lesches MD Signature Date/Time: 01/14/2022/6:56:43 PM    Final (Updated)    EEG adult  Result Date: 01/14/2022 Charlsie Quest, MD     01/14/2022  6:59 PM Patient Name: Taylor [name] MRN: [id number] Epilepsy Attending: Charlsie Quest Referring Physician/Provider: Lidia Collum, PA-C Date: 01/14/2022 Duration: 22.59 mins Patient history: 66yo M with witnessed seizure activity. EEG to evaluate for seizure Level of alertness:  lethargic AEDs during EEG study: Ativan Technical aspects: This EEG study was done with scalp electrodes positioned according to the 10-20 International system of electrode placement. Electrical activity was reviewed with band pass filter of 1-70Hz , sensitivity of 7 uV/mm, display speed of 16mm/sec with a 60Hz  notched filter applied as appropriate. EEG data were recorded continuously and digitally stored.  Video monitoring was available and reviewed as appropriate. Description: EEG showed continuous generalize 3 to 6 Hz theta-delta slowing. Hyperventilation and photic stimulation were not performed.   ABNORMALITY - Continuous slow, generalized IMPRESSION: This study is suggestive of severe diffuse encephalopathy, nonspecific etiology. No seizures or epileptiform discharges were seen throughout the recording. A normal interictal EEG does not exclude nor support the diagnosis of epilepsy.   DG Chest Port 1 View  Result Date: 01/14/2022 CLINICAL DATA:  Endotracheal tube.  OG tube EXAM: PORTABLE CHEST 1 VIEW COMPARISON:  Chest a 01/2022 FINDINGS: Endotracheal tube in good position. Gastric tube in the stomach with the tip in the body the stomach. Cardiac enlargement. Improvement in probable vascular congestion. Mild atelectasis in the bases and left perihilar region. No effusion. IMPRESSION: Endotracheal tube in good position.  NG tube in the body the stomach Improved vascular congestion.  Mild bilateral atelectasis. Electronically Signed   By: 02/2022 M.D.    On: 01/14/2022 17:34    Chlorhexidine Gluconate Cloth  6 each Topical Q0600   docusate  100 mg Per Tube BID   feeding supplement (PROSource TF20)  60 mL Per Tube TID   feeding supplement (VITAL HIGH PROTEIN)  1,000 mL Per Tube Q24H   insulin aspart  0-20 Units Subcutaneous Q4H   ondansetron  4 mg Oral Once   mouth rinse  15 mL Mouth Rinse Q2H   pantoprazole  40 mg Per Tube Daily   polyethylene glycol  17 g Per Tube Daily   sodium chloride flush  10-40 mL Intracatheter Q12H    BMET    Component Value Date/Time   NA 134 (L) 01/16/2022 0358   NA 139 07/17/2021 1148   K 3.9 01/16/2022 0358   CL 102 01/16/2022 0358   CO2 22 01/16/2022 0358   GLUCOSE 146 (H) 01/16/2022 0358  BUN 33 (H) 01/16/2022 0358   BUN 11 07/17/2021 1148   CREATININE 4.33 (H) 01/16/2022 0358   CALCIUM 7.3 (L) 01/16/2022 0358   GFRNONAA 14 (L) 01/16/2022 0358   GFRAA 71 03/25/2020 1258   CBC    Component Value Date/Time   WBC 7.2 01/16/2022 0358   RBC 4.25 01/16/2022 0358   HGB 12.0 (L) 01/16/2022 0358   HGB 13.3 10/16/2020 1046   HCT 35.6 (L) 01/16/2022 0358   HCT 41.4 10/16/2020 1046   PLT 170 01/16/2022 0358   PLT 206 10/16/2020 1046   MCV 83.8 01/16/2022 0358   MCV 88 10/16/2020 1046   MCH 28.2 01/16/2022 0358   MCHC 33.7 01/16/2022 0358   RDW 15.9 (H) 01/16/2022 0358   RDW 15.1 10/16/2020 1046   LYMPHSABS 2.1 01/14/2022 0631   LYMPHSABS 2.7 03/25/2020 1258   MONOABS 0.9 01/14/2022 0631   EOSABS 0.2 01/14/2022 0631   EOSABS 0.2 03/25/2020 1258   BASOSABS 0.0 01/14/2022 0631   BASOSABS 0.1 03/25/2020 1258    Assessment/Plan:  AKI - presumably ischemic ATN in setting of volume depletion and hypotension with concomitant ACE-inhibition.  Unclear if he has been taking NSAIDs for his chronic pain.  Scr was WNL on 07/17/21 but started on Farxiga in April 2023 without repeat renal function panel.  CT scan of abd/pelvis without hydronephrosis, trace perinephric stranding, and small nonobstructive  stones.  UNa 13, UCr 209, FeNa 0.47% consistent with pre-renal etiology. CRRT initiated 01/14/22 after seizure like activity and intubation.  All fluids 4K/2.5Ca, pre-filter and post-filter 400 ml/hr, dialysate 1500 mL/hr.  No heparin and to keep even.  UOP has significantly improved to 2.5 liters overnight.  Will stop CRRT around noon today and follow his response.    Hopefully starting to see renal recovery.   Continue to hold lisinopril, metformin, torsemide, lyrica, and farxiga.  Agree with IVF's.  Strict I's/O's.  Avoid nephrotoxic medications including NSAIDs and iodinated intravenous contrast exposure unless the latter is absolutely indicated.   Preferred narcotic agents for pain control are hydromorphone, fentanyl, and methadone. Morphine should not be used.  Avoid Baclofen and avoid oral sodium phosphate and magnesium citrate based laxatives / bowel preps.  Continue strict Input and Output monitoring.  Will monitor the patient closely with you and intervene or adjust therapy as indicated by changes in clinical status/labs   Acute metabolic encephalopathy - likely multifactorial with hypercarbia, opiate accumulation as well as lyrica in setting of ARF. AGMA - likely due to metformin and ARF.  Continue with isotonic bicarb infusion.  Acute hypercarbic and hypoxic respiratory failure - likely due to OHS and opiate accumulation. Hypotension - likely volume depletion given low FeNa.  Agree with IVF's and will continue to follow.  Currently off levophed gtt.  ECHO with EF 40-45%, unable to determine diastolic parameters. NICM - no evidence of volume overload as above.  Awaiting ECHO Hypovolemic hyponatremia - continue to follow with IVF's Myoclonic jerking - likely due to accumulation of lyrica in setting of ARF.  Continue to follow.    Irena Cords, MD [facility]

## 2022-01-17 DIAGNOSIS — N179 Acute kidney failure, unspecified: Secondary | ICD-10-CM | POA: Diagnosis not present

## 2022-01-17 DIAGNOSIS — J9602 Acute respiratory failure with hypercapnia: Secondary | ICD-10-CM | POA: Diagnosis not present

## 2022-01-17 LAB — CBC
HCT: 34 % — ABNORMAL LOW (ref 39.0–52.0)
Hemoglobin: 11.5 g/dL — ABNORMAL LOW (ref 13.0–17.0)
MCH: 28.3 pg (ref 26.0–34.0)
MCHC: 33.8 g/dL (ref 30.0–36.0)
MCV: 83.7 fL (ref 80.0–100.0)
Platelets: 151 10*3/uL (ref 150–400)
RBC: 4.06 MIL/uL — ABNORMAL LOW (ref 4.22–5.81)
RDW: 16 % — ABNORMAL HIGH (ref 11.5–15.5)
WBC: 5.6 10*3/uL (ref 4.0–10.5)
nRBC: 0 % (ref 0.0–0.2)

## 2022-01-17 LAB — CULTURE, RESPIRATORY W GRAM STAIN
Culture: NORMAL
Gram Stain: NONE SEEN

## 2022-01-17 LAB — GLUCOSE, CAPILLARY
Glucose-Capillary: 141 mg/dL — ABNORMAL HIGH (ref 70–99)
Glucose-Capillary: 161 mg/dL — ABNORMAL HIGH (ref 70–99)
Glucose-Capillary: 174 mg/dL — ABNORMAL HIGH (ref 70–99)
Glucose-Capillary: 179 mg/dL — ABNORMAL HIGH (ref 70–99)
Glucose-Capillary: 180 mg/dL — ABNORMAL HIGH (ref 70–99)
Glucose-Capillary: 181 mg/dL — ABNORMAL HIGH (ref 70–99)

## 2022-01-17 LAB — BASIC METABOLIC PANEL
Anion gap: 10 (ref 5–15)
BUN: 35 mg/dL — ABNORMAL HIGH (ref 8–23)
CO2: 22 mmol/L (ref 22–32)
Calcium: 8.1 mg/dL — ABNORMAL LOW (ref 8.9–10.3)
Chloride: 106 mmol/L (ref 98–111)
Creatinine, Ser: 3.44 mg/dL — ABNORMAL HIGH (ref 0.61–1.24)
GFR, Estimated: 19 mL/min — ABNORMAL LOW (ref >60)
Glucose, Bld: 161 mg/dL — ABNORMAL HIGH (ref 70–99)
Potassium: 3.4 mmol/L — ABNORMAL LOW (ref 3.5–5.1)
Sodium: 138 mmol/L (ref 135–145)

## 2022-01-17 LAB — MAGNESIUM: Magnesium: 2.4 mg/dL (ref 1.7–2.4)

## 2022-01-17 MED ORDER — DOCUSATE SODIUM 100 MG PO CAPS
100.0000 mg | ORAL_CAPSULE | Freq: Two times a day (BID) | ORAL | Status: DC
  Administered 2022-01-17 – 2022-01-22 (×7): 100 mg via ORAL
  Filled 2022-01-17 (×12): qty 1

## 2022-01-17 MED ORDER — RACEPINEPHRINE HCL 2.25 % IN NEBU
INHALATION_SOLUTION | RESPIRATORY_TRACT | Status: AC
  Administered 2022-01-17: 0.5 mL via RESPIRATORY_TRACT
  Filled 2022-01-17: qty 0.5

## 2022-01-17 MED ORDER — METOPROLOL TARTRATE 5 MG/5ML IV SOLN
5.0000 mg | Freq: Once | INTRAVENOUS | Status: AC
  Administered 2022-01-17: 5 mg via INTRAVENOUS
  Filled 2022-01-17: qty 5

## 2022-01-17 MED ORDER — APIXABAN 5 MG PO TABS
5.0000 mg | ORAL_TABLET | Freq: Two times a day (BID) | ORAL | Status: DC
  Administered 2022-01-17 – 2022-01-26 (×18): 5 mg via ORAL
  Filled 2022-01-17 (×18): qty 1

## 2022-01-17 MED ORDER — RACEPINEPHRINE HCL 2.25 % IN NEBU
0.5000 mL | INHALATION_SOLUTION | Freq: Once | RESPIRATORY_TRACT | Status: AC
Start: 2022-01-17 — End: 2022-01-17

## 2022-01-17 MED ORDER — HYDROMORPHONE HCL 2 MG PO TABS
2.0000 mg | ORAL_TABLET | Freq: Four times a day (QID) | ORAL | Status: DC | PRN
  Administered 2022-01-17: 2 mg via ORAL
  Filled 2022-01-17: qty 1

## 2022-01-17 MED ORDER — POLYETHYLENE GLYCOL 3350 17 G PO PACK
17.0000 g | PACK | Freq: Every day | ORAL | Status: DC
  Administered 2022-01-18 – 2022-01-21 (×3): 17 g via ORAL
  Filled 2022-01-17 (×6): qty 1

## 2022-01-17 MED ORDER — LIDOCAINE 5 % EX PTCH
1.0000 | MEDICATED_PATCH | CUTANEOUS | Status: DC
  Administered 2022-01-17 – 2022-01-21 (×5): 1 via TRANSDERMAL
  Filled 2022-01-17 (×8): qty 1

## 2022-01-17 MED ORDER — PANTOPRAZOLE SODIUM 40 MG PO TBEC
40.0000 mg | DELAYED_RELEASE_TABLET | Freq: Every day | ORAL | Status: DC
  Administered 2022-01-18 – 2022-01-19 (×2): 40 mg via ORAL
  Filled 2022-01-17 (×2): qty 1

## 2022-01-17 MED ORDER — OXYCODONE HCL 5 MG PO TABS
10.0000 mg | ORAL_TABLET | Freq: Four times a day (QID) | ORAL | Status: DC
  Administered 2022-01-17 – 2022-01-26 (×31): 10 mg via ORAL
  Filled 2022-01-17 (×33): qty 2

## 2022-01-17 MED ORDER — METOPROLOL SUCCINATE ER 50 MG PO TB24
200.0000 mg | ORAL_TABLET | Freq: Every day | ORAL | Status: DC
  Administered 2022-01-17 – 2022-01-18 (×2): 200 mg via ORAL
  Filled 2022-01-17 (×2): qty 4

## 2022-01-17 MED ORDER — SENNA 8.6 MG PO TABS
1.0000 | ORAL_TABLET | Freq: Every day | ORAL | Status: DC
  Administered 2022-01-18 – 2022-01-19 (×2): 8.6 mg via ORAL
  Filled 2022-01-17 (×6): qty 1

## 2022-01-17 NOTE — Progress Notes (Signed)
Patient stated he does not wear CPAP or BIPAP HS at home. Patient stated he did not want to wear one tonight. Pt currently on 5L [place] tolerating well.

## 2022-01-17 NOTE — Progress Notes (Addendum)
Patient ID: Taylor [name], male   DOB: 1956-04-18, 66 y.o.   MRN: [id number] S:Off of CRRT since noon yesterday with marked increase in UOP.  Became tachycardic and hypertensive overnight and started on oxycodone and precedex per PCCM.  Off of levophed.  Had some urinary retention with PVR of over 900 mL. O:BP 117/66   Pulse (!) 58   Temp 98.4 F (36.9 C) (Oral)   Resp (!) 24   Ht 6\' 1"  (1.854 m)   Wt (!) 164.2 kg   SpO2 100%   BMI 47.76 kg/m   Intake/Output Summary (Last 24 hours) at 01/17/2022 0913 Last data filed at 01/17/2022 0700 Gross per 24 hour  Intake 998.55 ml  Output 2205 ml  Net -1206.45 ml   Intake/Output: I/O last 3 completed shifts: In: 2816.3 [P.O.:120; I.V.:1651.7; NG/GT:828.7; IV Piggyback:216] Out: 4228 [Urine:3630]  Intake/Output this shift:  No intake/output data recorded. Weight change: -1.1 kg 06-18-2000 and sedated CVS: bradycardic at 58 Resp:ventilated bs bilaterally Abd: obese, +BS< soft, NT/ND Ext: 1+ edema of upper and lower extremities b/l  Recent Labs  Lab 01/13/22 2331 01/14/22 0631 01/14/22 0836 01/14/22 0938 01/14/22 1420 01/14/22 1819 01/14/22 2128 01/15/22 0327 01/15/22 0930 01/15/22 1542 01/16/22 0358 01/16/22 1431 01/17/22 0520  NA 132* 132* 132*   < > 132*   < > 131* 132* 135 135 134* 136 138  K 4.4 4.9  --    < > 5.0   < > 4.0 3.7 3.6 3.3* 3.9 4.1 3.4*  CL 92* 94*  --   --  96*  --   --  96*  --  99 102 102 106  CO2 22 21*  --   --  19*  --   --  20*  --  20* 22 23 22   GLUCOSE 153* 144*  --   --  134*  --   --  108*  --  144* 146* 178* 161*  BUN 69* 66*  --   --  67*  --   --  56*  --  48* 33* 29* 35*  CREATININE 9.94* 10.00* 9.97*  --  9.45*  --   --  7.87*  --  6.71* 4.33* 3.58* 3.44*  ALBUMIN 3.5 3.1*  --   --   --   --   --  2.7*  --  2.4* 2.5* 2.4*  --   CALCIUM 7.8* 7.6*  --   --  7.0*  --   --  7.4*  --  7.2* 7.3* 8.0* 8.1*  PHOS  --   --   --   --   --   --   --  7.3*  --  5.2* 3.6 3.3  --   AST 21 18  --    --   --   --   --   --   --   --   --   --   --   ALT 18 15  --   --   --   --   --   --   --   --   --   --   --    < > = values in this interval not displayed.   Liver Function Tests: Recent Labs  Lab 01/13/22 2331 01/14/22 0631 01/15/22 0327 01/15/22 1542 01/16/22 0358 01/16/22 1431  AST 21 18  --   --   --   --   ALT 18 15  --   --   --   --  ALKPHOS 83 71  --   --   --   --   BILITOT 0.7 0.6  --   --   --   --   PROT 7.8 6.9  --   --   --   --   ALBUMIN 3.5 3.1*   < > 2.4* 2.5* 2.4*   < > = values in this interval not displayed.   Recent Labs  Lab 01/13/22 2331  LIPASE 25   No results for input(s): "AMMONIA" in the last 168 hours. CBC: Recent Labs  Lab 01/13/22 2331 01/14/22 0631 01/14/22 0938 01/15/22 0327 01/15/22 0930 01/16/22 0358 01/17/22 0519  WBC 8.8 9.6  --  8.3  --  7.2 5.6  NEUTROABS 5.8 6.4  --   --   --   --   --   HGB 13.0 12.3*   < > 11.9* 11.9* 12.0* 11.5*  HCT 40.5 37.9*   < > 35.8* 35.0* 35.6* 34.0*  MCV 88.0 88.1  --  85.6  --  83.8 83.7  PLT 207 205  --  188  --  170 151   < > = values in this interval not displayed.   Cardiac Enzymes: No results for input(s): "CKTOTAL", "CKMB", "CKMBINDEX", "TROPONINI" in the last 168 hours. CBG: Recent Labs  Lab 01/16/22 1544 01/16/22 1927 01/16/22 2321 01/17/22 0332 01/17/22 0801  GLUCAP 167* 162* 155* 161* 174*    Iron Studies: No results for input(s): "IRON", "TIBC", "TRANSFERRIN", "FERRITIN" in the last 72 hours. Studies/Results: MR BRAIN WO CONTRAST  Result Date: 01/15/2022 CLINICAL DATA:  Provided history: Seizure disorder, clinical change. EXAM: MRI HEAD WITHOUT CONTRAST TECHNIQUE: Multiplanar, multiecho pulse sequences of the brain and surrounding structures were obtained without intravenous contrast. COMPARISON:  Head CT 01/14/2022. FINDINGS: Mild-to-moderate intermittent motion degradation. Brain: Mild cerebral atrophy without definite lobar predominance. Small chronic lacunar infarct  within the right external capsule (series 12, image 17). Prominent perivascular space within the left basal ganglia. Mild multifocal T2 FLAIR hyperintense signal abnormality within the cerebral white matter and pons, nonspecific but compatible chronic small vessel ischemic disease. No cortical encephalomalacia is identified. No appreciable hippocampal size or signal asymmetry. There is no acute infarct. No evidence of an intracranial mass. No chronic intracranial blood products. No extra-axial fluid collection. No midline shift. Vascular: Maintained flow voids within the proximal large arterial vessels. Skull and upper cervical spine: No focal suspicious marrow lesion. Sinuses/Orbits: No mass or acute finding within the imaged orbits. Mild mucosal thickening, and small-volume fluid, within the bilateral ethmoid sinuses. Other: Trace fluid within the bilateral mastoid air cells. IMPRESSION: 1. Intermittently motion degraded examination. 2. No evidence of acute intracranial abnormality. 3. No specific seizure focus is identified. 4. Mild chronic small-vessel ischemic changes within the cerebral white matter and pons. 5. Mild cerebral atrophy. 6. Bilateral ethmoid sinusitis. 7. Trace bilateral mastoid effusions. Electronically Signed   By: Jackey Loge D.O.   On: 01/15/2022 13:47    apixaban  5 mg Per Tube BID   Chlorhexidine Gluconate Cloth  6 each Topical Q0600   docusate  100 mg Per Tube BID   feeding supplement (PROSource TF20)  60 mL Per Tube TID   feeding supplement (VITAL HIGH PROTEIN)  1,000 mL Per Tube Q24H   insulin aspart  0-20 Units Subcutaneous Q4H   ondansetron  4 mg Oral Once   mouth rinse  15 mL Mouth Rinse Q2H   oxyCODONE  10 mg Per Tube Q6H   pantoprazole  40 mg Per Tube Daily   polyethylene glycol  17 g Per Tube Daily   sennosides  5 mL Per Tube Daily   sodium chloride flush  10-40 mL Intracatheter Q12H    BMET    Component Value Date/Time   NA 138 01/17/2022 0520   NA 139  07/17/2021 1148   K 3.4 (L) 01/17/2022 0520   CL 106 01/17/2022 0520   CO2 22 01/17/2022 0520   GLUCOSE 161 (H) 01/17/2022 0520   BUN 35 (H) 01/17/2022 0520   BUN 11 07/17/2021 1148   CREATININE 3.44 (H) 01/17/2022 0520   CALCIUM 8.1 (L) 01/17/2022 0520   GFRNONAA 19 (L) 01/17/2022 0520   GFRAA 71 03/25/2020 1258   CBC    Component Value Date/Time   WBC 5.6 01/17/2022 0519   RBC 4.06 (L) 01/17/2022 0519   HGB 11.5 (L) 01/17/2022 0519   HGB 13.3 10/16/2020 1046   HCT 34.0 (L) 01/17/2022 0519   HCT 41.4 10/16/2020 1046   PLT 151 01/17/2022 0519   PLT 206 10/16/2020 1046   MCV 83.7 01/17/2022 0519   MCV 88 10/16/2020 1046   MCH 28.3 01/17/2022 0519   MCHC 33.8 01/17/2022 0519   RDW 16.0 (H) 01/17/2022 0519   RDW 15.1 10/16/2020 1046   LYMPHSABS 2.1 01/14/2022 0631   LYMPHSABS 2.7 03/25/2020 1258   MONOABS 0.9 01/14/2022 0631   EOSABS 0.2 01/14/2022 0631   EOSABS 0.2 03/25/2020 1258   BASOSABS 0.0 01/14/2022 0631   BASOSABS 0.1 03/25/2020 1258    Assessment/Plan:  AKI - presumably ischemic ATN in setting of volume depletion and hypotension with concomitant ACE-inhibition.  Unclear if he has been taking NSAIDs for his chronic pain.  Scr was WNL on 07/17/21 but started on Farxiga in April 2023 without repeat renal function panel.  CT scan of abd/pelvis without hydronephrosis, trace perinephric stranding, and small nonobstructive stones.  UNa 13, UCr 209, FeNa 0.47% consistent with pre-renal etiology. CRRT initiated 01/14/22 after seizure like activity and intubation.  All fluids 4K/2.5Ca, pre-filter and post-filter 400 ml/hr, dialysate 1500 mL/hr.  No heparin and to keep even.  UOP has significantly improved to 2.5 liters overnight.  CRRT stopped 8/11.  UOP remains good, and BUN/Cr have improved without CRRT.  Hopefully starting to see renal recovery.  Continue to hold CRRT. Continue to hold lisinopril, metformin, torsemide, lyrica, and farxiga.  Agree with IVF's.  Strict I's/O's.   Avoid nephrotoxic medications including NSAIDs and iodinated intravenous contrast exposure unless the latter is absolutely indicated.   Preferred narcotic agents for pain control are hydromorphone, fentanyl, and methadone. Morphine should not be used.  Avoid Baclofen and avoid oral sodium phosphate and magnesium citrate based laxatives / bowel preps.  Continue strict Input and Output monitoring.  Will monitor the patient closely with you and intervene or adjust therapy as indicated by changes in clinical status/labs   Urinary retention - recheck bladder scan and if still >300 mL, would replace foley catheter Acute metabolic encephalopathy - likely multifactorial with hypercarbia, opiate accumulation as well as lyrica in setting of ARF. AGMA - likely due to metformin and ARF.  Continue with isotonic bicarb infusion.  Acute hypercarbic and hypoxic respiratory failure - likely due to OHS and opiate accumulation. Hypotension - likely volume depletion given low FeNa.  Agree with IVF's and will continue to follow.  Currently off levophed gtt.  ECHO with EF 40-45%, unable to determine diastolic parameters. NICM - no evidence of volume overload as above.  Awaiting ECHO Hypovolemic hyponatremia - continue to follow with IVF's Myoclonic jerking - likely due to accumulation of lyrica in setting of ARF.  Continue to follow.      Irena Cords, MD [facility]

## 2022-01-17 NOTE — Progress Notes (Signed)
eLink Physician-Brief Progress Note Patient Name: SRIHAN BRUTUS DOB: [birth year] MRN: [id number]   Date of Service  01/17/2022  HPI/Events of Note  A fib rate 132, SBP 170  Camera eval done. No diaphoresis, resting. Obese. 66 yo man with HTN, CHF ef 35-40%, HLD, afib on eliquis  eICU Interventions  Discussed with RN, got metoprolol 200 in noon . CHF seems stable. AKI. Improving encephalopathy  - lopressor 5 mg IV once ordered. On anti coagulation.      Intervention Category Intermediate Interventions: Arrhythmia - evaluation and management  Ranee Gosselin 01/17/2022, 9:39 PM  4:37 A fib RVR again, stable MAP. Earlier lopressor worked well.  - lopressor IV ordered once again.

## 2022-01-17 NOTE — Procedures (Signed)
Extubation Procedure Note  Patient Details:   Name: Taylor [name] DOB: 07-Oct-1955 MRN: [id number]   Airway Documentation:    Vent end date: 01/17/22 Vent end time: 1130   Evaluation  O2 sats: stable throughout Complications: No apparent complications Patient did tolerate procedure well. Bilateral Breath Sounds: Diminished   Yes  Dewain Penning T 01/17/2022, 11:39 AM

## 2022-01-17 NOTE — Progress Notes (Signed)
NAME:  Taylor [name], MRN:  [id number], DOB:  1955-11-20, LOS: 3 ADMISSION DATE:  01/13/2022, CONSULTATION DATE:  01/14/22 REFERRING MD:  Taylor [name] , CHIEF COMPLAINT:  AMS    History of Present Illness:  66 yo man with HTN, CHF ef 35-40%, HLD, afib on eliquis, DM2, obesity, back pain (on ambien, oxycodone, dilaudid, lyrica.  Presenting with AMS, new renal failure.   Recently returned from [name]. Since then has been excessively sleepy and fatigued with associated nausea. This is rapid change from basleine. Last took home meds yesterday 5pm. Denies pain. Arousable, only answers simple questions.  Falls asleep.    Transferred to [name] ED (no ICU beds).    In ED found to have B LE edema.   Hypotensive. Given LR 500cc  x 2.  Levophed.  Transferred to ICU.  Pertinent  Medical History   Past Medical History:  Diagnosis Date   Arthritis    Broken leg 02/07/2019   Depression    Diabetes mellitus without complication ([place])    Hypertension    No current facility-administered medications on file prior to encounter.   Current Outpatient Medications on File Prior to Encounter  Medication Sig Dispense Refill   apixaban (ELIQUIS) 5 MG TABS tablet TAKE 1 TABLET BY MOUTH TWICE DAILY 60 tablet 5   dapagliflozin propanediol (FARXIGA) 10 MG TABS tablet Take 1 tablet (10 mg total) by mouth daily before breakfast. 21 tablet 0   glipiZIDE (GLUCOTROL XL) 10 MG 24 hr tablet TAKE 1 TABLET BY MOUTH ONCE DAILY. 90 tablet 4   glucose blood (ONETOUCH ULTRA) test strip Use to check sugar daily for type 2 diabetes E11.9 100 each 4   HYDROmorphone (DILAUDID) 4 MG tablet Take 4 mg by mouth in the morning, at noon, in the evening, and at bedtime. Must be name brand     lisinopril (ZESTRIL) 10 MG tablet TAKE 1 TABLET BY MOUTH ONCE DAILY. 90 tablet 0   metFORMIN (GLUCOPHAGE) 1000 MG tablet TAKE 1 TABLET BY MOUTH TWICE DAILY 180 tablet 4   metoprolol (TOPROL XL) 200 MG 24 hr tablet Take 1 tablet (200 mg total) by mouth  daily. 30 tablet 5   MOVANTIK 25 MG TABS tablet      NARCAN 4 MG/0.1ML LIQD nasal spray kit Place 0.4 mg into the nose as needed (accidental overdose).     OXYCONTIN 20 MG 12 hr tablet Take 20 mg by mouth in the morning and at bedtime. Take 20 mg by mouth four times a day     pioglitazone (ACTOS) 45 MG tablet TAKE 1 TABLET BY MOUTH ONCE DAILY. 90 tablet 4   potassium chloride SA (KLOR-CON) 20 MEQ tablet Take 1 tablet (20 mEq total) by mouth daily. 90 tablet 1   pregabalin (LYRICA) 100 MG capsule Take 100 mg by mouth 3 (three) times daily.     simvastatin (ZOCOR) 40 MG tablet TAKE 1 TABLET BY MOUTH ONCE DAILY. 90 tablet 0   torsemide (DEMADEX) 20 MG tablet Take 40 MG (2 tabs) every AM, and Take 20 MG (1 tab) every PM. 90 tablet 3   vitamin B-12 (CYANOCOBALAMIN) 1000 MCG tablet Take 1,000 mcg by mouth daily.     zolpidem (AMBIEN CR) 12.5 MG CR tablet Take 12.5 mg by mouth at bedtime.        Significant Hospital Events: Including procedures, antibiotic start and stop dates in addition to other pertinent events   8/9 admitted to ICU 8/9 intubated and HD cath  placed after witnessed myoclonic jerking 8/11 precedex and oxycodone, CRRT dc 8/12 extubated  Interim History / Subjective:  Hard of hearing, oriented to self and family members. Sedation weaned this AM and patient was extubated.  Objective   Blood pressure (!) 135/102, pulse (!) 121, temperature 98.4 F (36.9 C), temperature source Oral, resp. rate 13, height _0  (1.854 m), weight (!) 164.2 kg, SpO2 100 %.    Vent Mode: PSV;CPAP FiO2 (%):  [40 %] 40 % Set Rate:  [24 bmp] 24 bmp Vt Set:  [550 mL] 550 mL PEEP:  [5 cmH20-8 cmH20] 5 cmH20 Pressure Support:  [12 cmH20] 12 cmH20 Plateau Pressure:  [20 PVG68-15 cmH20] 21 cmH20   Intake/Output Summary (Last 24 hours) at 01/17/2022 1301 Last data filed at 01/17/2022 0700 Gross per 24 hour  Intake 760.52 ml  Output 1675 ml  Net -914.48 ml    Filed Weights   01/15/22 0350 01/16/22  0252 01/17/22 0500  Weight: (!) 166.8 kg (!) 165.3 kg (!) 164.2 kg   Constitutional: morbidly obese, chronically ill appearing Cardiovascular: regular rate and rhythm, no m/r/g Pulmonary/Chest: normal work of breathing on room air, lungs clear to auscultation bilaterally, no wheezing or rhonchi Abdominal: soft, non-tender, non-distended MSK: normal bulk and tone Neurological: sleepy, oriented x 3, hard of hearing Skin: trace edema to lower extremities bilaterally, warm to touch  Na 138 K 3.4 BUN 35  WBC 5.6 CBC stable at 11.5   Resolved Hospital Problem list   Acute combined metabolic and respiratory acidosis CAP  Assessment & Plan:   Acute encephalopathy- improved Sedation decreased this morning and he was able to be extubated. He is sleepy but easy awoken and oriented.  - continue supportive care - home dose of oxycodone 10 q 6 restarted 8/11  Generalized tonic clonic seizure Intubated 8/9 for airway protection, extubated 8/12.  Neurology following, unclear if true seizure and if so would be provoked in setting of elevated BUN. MRI negative EEG negative. -holding pregabalin -depakote dc, if another seizure-like activity then would resume depakote and discharge him on that. -neurology following  Acute renal failure  ATN Uremia Urinary retention Baseline Cr 1.0, Currently 3.44 with BUN of 35 following CRRT (discontinued 8/11). CRRT intiated due to sz thought to be related to medications. Urine outpt of 2.2L 8/11. - gentle fluids - strict I+Os, daily weights - daily BMP - avoid nephrotoxic medications  Acute hypercarbic respiratory failure-resolved Likely has OHS. - BiPAP QHS and PRN    HFrEF Patient does not appear to be too volume overloaded at this time, though exam is limited by body habitus. Repeat echo 8/9 showing EF 40-45%. CXR showing possible early edema vs infiltrate.  - holding diuretics in the setting of renal failure - holding metoprolol with HR in 50s  overnight  Afib Takes Eliquis at home. Holding Eliquis for possible procedures. - eliquis 5 mg BID  DM2 Glucose has been stable. He takes home glipizide which may have prolonged effect in the setting of his renal failure - Q4 CBG - SSI   Best Practice (right click and "Reselect all SmartList Selections" daily)   Diet/type: NPO DVT prophylaxis: DOAC  GI prophylaxis: PPI Lines: N/A Foley:  present Code Status:  full code Last date of multidisciplinary goals of care discussion [family updated at bedside 8/12]  Yesena Reaves M. Gabrial Domine, D.O.  Internal Medicine Resident, PGY-2 [name]  Pager: [phone] 1:24 PM, 01/17/2022

## 2022-01-18 ENCOUNTER — Inpatient Hospital Stay (HOSPITAL_COMMUNITY): Payer: Medicare Other

## 2022-01-18 DIAGNOSIS — J9602 Acute respiratory failure with hypercapnia: Secondary | ICD-10-CM | POA: Diagnosis not present

## 2022-01-18 DIAGNOSIS — N179 Acute kidney failure, unspecified: Secondary | ICD-10-CM | POA: Diagnosis not present

## 2022-01-18 LAB — CBC
HCT: 37.5 % — ABNORMAL LOW (ref 39.0–52.0)
Hemoglobin: 12.5 g/dL — ABNORMAL LOW (ref 13.0–17.0)
MCH: 28.3 pg (ref 26.0–34.0)
MCHC: 33.3 g/dL (ref 30.0–36.0)
MCV: 85 fL (ref 80.0–100.0)
Platelets: 164 10*3/uL (ref 150–400)
RBC: 4.41 MIL/uL (ref 4.22–5.81)
RDW: 15.7 % — ABNORMAL HIGH (ref 11.5–15.5)
WBC: 9.3 10*3/uL (ref 4.0–10.5)
nRBC: 0 % (ref 0.0–0.2)

## 2022-01-18 LAB — RENAL FUNCTION PANEL
Albumin: 2.6 g/dL — ABNORMAL LOW (ref 3.5–5.0)
Anion gap: 13 (ref 5–15)
BUN: 37 mg/dL — ABNORMAL HIGH (ref 8–23)
CO2: 23 mmol/L (ref 22–32)
Calcium: 8.7 mg/dL — ABNORMAL LOW (ref 8.9–10.3)
Chloride: 104 mmol/L (ref 98–111)
Creatinine, Ser: 2.7 mg/dL — ABNORMAL HIGH (ref 0.61–1.24)
GFR, Estimated: 26 mL/min — ABNORMAL LOW (ref >60)
Glucose, Bld: 174 mg/dL — ABNORMAL HIGH (ref 70–99)
Phosphorus: 4.4 mg/dL (ref 2.5–4.6)
Potassium: 3.9 mmol/L (ref 3.5–5.1)
Sodium: 140 mmol/L (ref 135–145)

## 2022-01-18 LAB — GLUCOSE, CAPILLARY
Glucose-Capillary: 161 mg/dL — ABNORMAL HIGH (ref 70–99)
Glucose-Capillary: 164 mg/dL — ABNORMAL HIGH (ref 70–99)
Glucose-Capillary: 166 mg/dL — ABNORMAL HIGH (ref 70–99)
Glucose-Capillary: 166 mg/dL — ABNORMAL HIGH (ref 70–99)
Glucose-Capillary: 212 mg/dL — ABNORMAL HIGH (ref 70–99)
Glucose-Capillary: 214 mg/dL — ABNORMAL HIGH (ref 70–99)

## 2022-01-18 LAB — CULTURE, BLOOD (ROUTINE X 2)
Culture: NO GROWTH
Special Requests: ADEQUATE

## 2022-01-18 MED ORDER — IPRATROPIUM BROMIDE 0.02 % IN SOLN
RESPIRATORY_TRACT | Status: AC
  Filled 2022-01-18: qty 2.5

## 2022-01-18 MED ORDER — METOPROLOL TARTRATE 50 MG PO TABS
150.0000 mg | ORAL_TABLET | Freq: Two times a day (BID) | ORAL | Status: DC
  Administered 2022-01-19 (×2): 150 mg via ORAL
  Filled 2022-01-18: qty 6
  Filled 2022-01-18: qty 1

## 2022-01-18 MED ORDER — DEXMEDETOMIDINE HCL IN NACL 400 MCG/100ML IV SOLN
0.4000 ug/kg/h | INTRAVENOUS | Status: DC
  Administered 2022-01-18: 0.4 ug/kg/h via INTRAVENOUS
  Administered 2022-01-19: 0.2 ug/kg/h via INTRAVENOUS
  Filled 2022-01-18 (×2): qty 100

## 2022-01-18 MED ORDER — ZOLPIDEM TARTRATE 5 MG PO TABS
5.0000 mg | ORAL_TABLET | Freq: Every day | ORAL | Status: DC
  Administered 2022-01-18 – 2022-01-25 (×8): 5 mg via ORAL
  Filled 2022-01-18 (×8): qty 1

## 2022-01-18 MED ORDER — LIP MEDEX EX OINT
TOPICAL_OINTMENT | CUTANEOUS | Status: DC | PRN
  Administered 2022-01-18: 75 via TOPICAL
  Filled 2022-01-18: qty 7

## 2022-01-18 MED ORDER — LEVALBUTEROL HCL 0.63 MG/3ML IN NEBU
INHALATION_SOLUTION | RESPIRATORY_TRACT | Status: AC
  Filled 2022-01-18: qty 3

## 2022-01-18 MED ORDER — METOPROLOL TARTRATE 25 MG PO TABS
100.0000 mg | ORAL_TABLET | Freq: Once | ORAL | Status: AC
  Administered 2022-01-18: 100 mg via ORAL
  Filled 2022-01-18: qty 4

## 2022-01-18 MED ORDER — ORAL CARE MOUTH RINSE: 15.0000 mL | OROMUCOSAL | Status: DC | PRN

## 2022-01-18 MED ORDER — SORBITOL 70 % SOLN
960.0000 mL | TOPICAL_OIL | Freq: Once | ORAL | Status: DC
  Filled 2022-01-18: qty 473

## 2022-01-18 MED ORDER — METOPROLOL TARTRATE 5 MG/5ML IV SOLN
5.0000 mg | Freq: Once | INTRAVENOUS | Status: AC
  Administered 2022-01-18: 5 mg via INTRAVENOUS
  Filled 2022-01-18: qty 5

## 2022-01-18 MED ORDER — METOPROLOL TARTRATE 5 MG/5ML IV SOLN
2.5000 mg | INTRAVENOUS | Status: DC | PRN
  Administered 2022-01-20: 5 mg via INTRAVENOUS
  Filled 2022-01-18 (×2): qty 5

## 2022-01-18 MED ORDER — FUROSEMIDE 10 MG/ML IJ SOLN
100.0000 mg | Freq: Once | INTRAVENOUS | Status: AC
  Administered 2022-01-18: 100 mg via INTRAVENOUS
  Filled 2022-01-18: qty 10

## 2022-01-18 NOTE — Evaluation (Signed)
Physical Therapy Evaluation Patient Details Name: Taylor [name] MRN: [id number] DOB: 1955/09/28 Today's Date: 01/18/2022  History of Present Illness  66 year old who presented with increased lethargy and found with AKI requiring dialysis; Started CRRT 8/9 and held CRRT 8/11, monitoring for renal recovery; intubated 8/9, extubated 8/12; with HTN, chronic systolic heart failure, atrial fibrillation, DM2 and chronic pain medications on home dilaudid, oxycontin, lyrica, ambien  Clinical Impression   Pt admitted with above diagnosis. Lives at home with his mother, in a single-level home with a few steps to enter; Prior to admission, pt was able to manage in home without physical assist, typically walking household distances without an assistive device; sedentary lifestyle; difficulty donning socks, tends to wear slip on shoes; Presents to PT with generalized weakness, decr activity tolerance, decr independence and safety with functional mobility; Mod assist to help up to sitting EOB, min-steadying assist to stand from EOB; Mod assist to stand from recliner; Making excellent improvements in medical status, and anticipate good functional progress as he improves medically;  Pt currently with functional limitations due to the deficits listed below (see PT Problem List). Pt will benefit from skilled PT to increase their independence and safety with mobility to allow discharge to the venue listed below.          Recommendations for follow up therapy are one component of a multi-disciplinary discharge planning process, led by the attending physician.  Recommendations may be updated based on patient status, additional functional criteria and insurance authorization.  Follow Up Recommendations Home health PT (If slower than anticipated progress, must consider SNF for rehab)      Assistance Recommended at Discharge Intermittent Supervision/Assistance  Patient can return home with the following  A lot of  help with walking and/or transfers;A lot of help with bathing/dressing/bathroom;Assistance with cooking/housework;Assist for transportation    Equipment Recommendations Rolling walker (2 wheels);BSC/3in1 (wide)  Recommendations for Other Services  OT consult    Functional Status Assessment Patient has had a recent decline in their functional status and demonstrates the ability to make significant improvements in function in a reasonable and predictable amount of time.     Precautions / Restrictions Precautions Precautions: Fall      Mobility  Bed Mobility Overal bed mobility: Needs Assistance Bed Mobility: Supine to Sit     Supine to sit: Mod assist, HOB elevated     General bed mobility comments: Slow moving, but able to move his feet off of teh EOB; Mod handheld assist to pull to sit; used bed pad to assist pt in squaring off hips    Transfers Overall transfer level: Needs assistance Equipment used: 1 person hand held assist Transfers: Sit to/from Stand, Bed to chair/wheelchair/BSC Sit to Stand: Min assist, Mod assist   Step pivot transfers: Min assist       General transfer comment: Min assis to rise from EOB; Light Mod assist to stand from lower recliner seat; Min assist to steady as pt took pivot steps bed to recliner chair -- ten back to bed due to need for bedpan    Ambulation/Gait                  Stairs            Wheelchair Mobility    Modified Rankin (Stroke Patients Only)       Balance Overall balance assessment: Needs assistance   Sitting balance-Leahy Scale: Fair       Standing balance-Leahy Scale: Poor  Pertinent Vitals/Pain Pain Assessment Pain Assessment: Faces Faces Pain Scale: Hurts little more Pain Location: chronic low back pain Pain Descriptors / Indicators: Grimacing Pain Intervention(s): Monitored during session, Repositioned    Home Living Family/patient expects to be  discharged to:: Private residence Living Arrangements: Parent Available Help at Discharge: Family Type of Home: House Home Access: Stairs to enter Entrance Stairs-Rails: Right Entrance Stairs-Number of Steps: 2   Home Layout: One level Home Equipment: None      Prior Function Prior Level of Function : Independent/Modified Independent             Mobility Comments: Typically walks household distances; Sedentary lifestyle ADLs Comments: Reports usually doesn't donn socks, puts on slip on shoes; Pt's mother performs household management, cooking, cleaning     Hand Dominance   Dominant Hand: Right    Extremity/Trunk Assessment   Upper Extremity Assessment Upper Extremity Assessment: Generalized weakness;Defer to OT evaluation (with noted limitations in shoulder flex/abd against gravity)    Lower Extremity Assessment Lower Extremity Assessment: Generalized weakness    Cervical / Trunk Assessment Cervical / Trunk Assessment: Other exceptions Cervical / Trunk Exceptions: large body habitus, trunk girth  Communication   Communication: HOH  Cognition Arousal/Alertness: Awake/alert Behavior During Therapy: WFL for tasks assessed/performed, Impulsive Overall Cognitive Status: Within Functional Limits for tasks assessed (for simple tasks)                                 General Comments: Interacts playfully with his brother, Clide Cliff, who was visiting        General Comments General comments (skin integrity, edema, etc.): Tachy HR throughout session, with HR range from 126-148 bpm; HR tending to stay in the 130s with standing/step pivoting    Exercises     Assessment/Plan    PT Assessment Patient needs continued PT services  PT Problem List Decreased strength;Decreased range of motion;Decreased activity tolerance;Decreased balance;Decreased mobility;Decreased coordination;Decreased knowledge of use of DME;Decreased safety awareness;Decreased knowledge of  precautions;Cardiopulmonary status limiting activity;Obesity;Pain       PT Treatment Interventions DME instruction;Gait training;Stair training;Functional mobility training;Therapeutic activities;Therapeutic exercise;Balance training;Neuromuscular re-education;Cognitive remediation;Patient/family education    PT Goals (Current goals can be found in the Care Plan section)  Acute Rehab PT Goals Patient Stated Goal: "Can we walk home from here?" PT Goal Formulation: With patient Time For Goal Achievement: 02/01/22 Potential to Achieve Goals: Good    Frequency Min 3X/week     Co-evaluation               AM-PAC PT "6 Clicks" Mobility  Outcome Measure Help needed turning from your back to your side while in a flat bed without using bedrails?: A Little Help needed moving from lying on your back to sitting on the side of a flat bed without using bedrails?: A Little Help needed moving to and from a bed to a chair (including a wheelchair)?: A Lot Help needed standing up from a chair using your arms (e.g., wheelchair or bedside chair)?: A Lot Help needed to walk in hospital room?: Total Help needed climbing 3-5 steps with a railing? : Total 6 Click Score: 12    End of Session Equipment Utilized During Treatment: Gait belt Activity Tolerance: Patient tolerated treatment well Patient left: in bed;with call bell/phone within reach;with nursing/sitter in room (getting on bedpain) Nurse Communication: Mobility status PT Visit Diagnosis: Unsteadiness on feet (R26.81);Other abnormalities of gait and mobility (R26.89);Muscle weakness (  generalized) (M62.81)    Time: 7858-8502 PT Time Calculation (min) (ACUTE ONLY): 42 min   Charges:   PT Evaluation $PT Eval Moderate Complexity: 1 Mod PT Treatments $Therapeutic Activity: 23-37 mins        Van Clines, PT  Acute Rehabilitation Services Office [phone]   Levi Aland 01/18/2022, 12:52 PM

## 2022-01-18 NOTE — Progress Notes (Signed)
Patient with increase work of breathing and heart rate in the 150-170s. Restless, moaning and will not keep any equipment on. MD notified. Respiratory at bedside. Patient will not keep the bipap on. PA at bedside. Chest xray order, 100 of lasix ordered.

## 2022-01-18 NOTE — Progress Notes (Signed)
NAME:  Taylor [name], MRN:  [id number], DOB:  November 22, 1955, LOS: 4 ADMISSION DATE:  01/13/2022, CONSULTATION DATE:  01/14/22 REFERRING MD:  [name] , CHIEF COMPLAINT:  AMS    History of Present Illness:  66 yo man with HTN, CHF ef 35-40%, HLD, afib on eliquis, DM2, obesity, back pain (on ambien, oxycodone, dilaudid, lyrica.  Presenting with AMS, new renal failure.   Recently returned from [place]. Since then has been excessively sleepy and fatigued with associated nausea. This is rapid change from bas[place]. Last took home meds yesterday 5pm. Denies pain. Arousable, only answers simple questions.  Falls asleep.    Transferred to [name] ED (no ICU beds).    In ED found to have B LE edema.   Hypotensive. Given LR 500cc  x 2.  Levophed.  Transferred to ICU.  Pertinent  Medical History   Past Medical History:  Diagnosis Date   Arthritis    Broken leg 02/07/2019   Depression    Diabetes mellitus without complication ([place])    Hypertension    No current facility-administered medications on file prior to encounter.   Current Outpatient Medications on File Prior to Encounter  Medication Sig Dispense Refill   apixaban (ELIQUIS) 5 MG TABS tablet TAKE 1 TABLET BY MOUTH TWICE DAILY 60 tablet 5   dapagliflozin propanediol (FARXIGA) 10 MG TABS tablet Take 1 tablet (10 mg total) by mouth daily before breakfast. 21 tablet 0   glipiZIDE (GLUCOTROL XL) 10 MG 24 hr tablet TAKE 1 TABLET BY MOUTH ONCE DAILY. 90 tablet 4   glucose blood (ONETOUCH ULTRA) test strip Use to check sugar daily for type 2 diabetes E11.9 100 each 4   HYDROmorphone (DILAUDID) 4 MG tablet Take 4 mg by mouth in the morning, at noon, in the evening, and at bedtime. Must be name brand     lisinopril (ZESTRIL) 10 MG tablet TAKE 1 TABLET BY MOUTH ONCE DAILY. 90 tablet 0   metFORMIN (GLUCOPHAGE) 1000 MG tablet TAKE 1 TABLET BY MOUTH TWICE DAILY 180 tablet 4   metoprolol (TOPROL XL) 200 MG 24 hr tablet Take 1 tablet (200 mg total) by mouth  daily. 30 tablet 5   MOVANTIK 25 MG TABS tablet      NARCAN 4 MG/0.1ML LIQD nasal spray kit Place 0.4 mg into the nose as needed (accidental overdose).     OXYCONTIN 20 MG 12 hr tablet Take 20 mg by mouth in the morning and at bedtime. Take 20 mg by mouth four times a day     pioglitazone (ACTOS) 45 MG tablet TAKE 1 TABLET BY MOUTH ONCE DAILY. 90 tablet 4   potassium chloride SA (KLOR-CON) 20 MEQ tablet Take 1 tablet (20 mEq total) by mouth daily. 90 tablet 1   pregabalin (LYRICA) 100 MG capsule Take 100 mg by mouth 3 (three) times daily.     simvastatin (ZOCOR) 40 MG tablet TAKE 1 TABLET BY MOUTH ONCE DAILY. 90 tablet 0   torsemide (DEMADEX) 20 MG tablet Take 40 MG (2 tabs) every AM, and Take 20 MG (1 tab) every PM. 90 tablet 3   vitamin B-12 (CYANOCOBALAMIN) 1000 MCG tablet Take 1,000 mcg by mouth daily.     zolpidem (AMBIEN CR) 12.5 MG CR tablet Take 12.5 mg by mouth at bedtime.        Significant Hospital Events: Including procedures, antibiotic start and stop dates in addition to other pertinent events   8/9 admitted to ICU 8/9 intubated and HD cath  placed after witnessed myoclonic jerking 8/11 precedex and oxycodone, CRRT dc 8/12 extubated  Interim History / Subjective:  Hrs in 120-130s overnight. He received 2 doses of lopressor.  Hard of hearing. Denies shortness of breath, chest pain, chronic back pain present.  Objective   Blood pressure 134/87, pulse (!) 131, temperature 98.6 F (37 C), temperature source Axillary, resp. rate 13, height '6\' 1"'  (1.854 m), weight (!) 160.3 kg, SpO2 90 %.    Vent Mode: PSV;CPAP FiO2 (%):  [40 %] 40 % PEEP:  [5 cmH20] 5 cmH20 Pressure Support:  [12 cmH20] 12 cmH20   Intake/Output Summary (Last 24 hours) at 01/18/2022 5638 Last data filed at 01/18/2022 9373 Gross per 24 hour  Intake 50.22 ml  Output 2800 ml  Net -2749.78 ml    Filed Weights   01/16/22 0252 01/17/22 0500 01/18/22 0500  Weight: (!) 165.3 kg (!) 164.2 kg (!) 160.3 kg    Constitutional: morbidly obese, chronically ill appearing Cardiovascular: irregularly  irregular rhythm, tachycardic, no m/r/g Pulmonary/Chest: normal work of breathing on room air, lungs clear to auscultation bilaterally, no wheezing or rhonchi Abdominal: soft, non-tender, non-distended MSK: normal bulk and tone Neurological: alert, oriented x 3, hard of hearing Skin: trace edema to lower extremities bilaterally, warm to touch  Na 140 K 3.9 BUN 37  WBC 5.6->9.3 CBC stable at 12.5   Resolved Hospital Problem list   Acute combined metabolic and respiratory acidosis CAP Acute metabolic encephalopathy  Assessment & Plan:   Generalized tonic clonic seizure Intubated 8/9 for airway protection, extubated 8/12.  Neurology following peripherally, unclear if true seizure and if so would be provoked in setting of elevated BUN. MRI negative EEG negative. -holding pregabalin -depakote dc, if another seizure-like activity then would resume depakote and discharge him on that. -neurology following  Acute renal failure  ATN Uremia Urinary retention Baseline Cr 1.0. He was placed on CRRT 8/10 2/2 to possible seizure from lyrica. CRRT discontinued 8/11. Urine output remiains good at 1400 cc out yesterday. He is net negative 3L. Nephrology has signed off. - strict I+Os, daily weights - daily BMP - avoid nephrotoxic medications  Afib Takes Eliquis  and metoprolol succinate 200 mg qd at home. Eliquis restarted. Afib with HR have been elevated into 120-130s overnight since yesterday afternoon.  - eliquis 5 mg BID -dc metoprolol sucicnate 200  mg - one time metoprolol tartrate 100 mg this evening - metoprolol tartrate 150 mg BID  Acute hypercarbic respiratory failure-resolved Likely has OHS.  - BiPAP QHS and PRN    HFrEF Patient does not appear to be too volume overloaded at this time, though exam is limited by body habitus. Repeat echo 8/9 showing EF 40-45%.  - metoprolol restarted  8/12 - holding diuretics in the setting of renal failure - restart GDMT as able. Home medications include dapagliflozin, lisinopril, torsemide.  DM2 Glucose has been stable. He takes home glipizide which may have prolonged effect in the setting of his renal failure - Q4 CBG - SSI  Stable for transfer to [facility] (right click and "Reselect all SmartList Selections" daily)   Diet/type: Regular consistency (see orders) DVT prophylaxis: DOAC  GI prophylaxis: PPI Lines: N/A Foley:  present Code Status:  full code Last date of multidisciplinary goals of care discussion [8/13 pending]  Daleen Bo. Taunja Brickner, D.O.  Internal Medicine Resident, PGY-2 [name]  Pager: [phone] 6:38 AM, 01/18/2022

## 2022-01-18 NOTE — Progress Notes (Signed)
PCCM called to bedside to assess respiratory status. Patient went into afib/rvr 170s. BP stable. Given metoprolol. Audibly wheezing and in resp distress. CXR showing pulm edema. Bipap placed on patient but patient claustrophobic and pulling off mask.  Acute respiratory failure Pulmonary edema P: -bipap -lasix 100 IV -Cxr in am -precedex for anxiety  Afib rvr P: -telemetry monitoring -check bmp/mag -prn lopressor; consider amio  JD Daryel [facility] 01/18/2022, 11:17 PM  Please see [email or web address] for pager details.  From 7A-7P if no response, please call [phone]. After hours, please call ELink [phone].

## 2022-01-18 NOTE — Progress Notes (Signed)
Patient ID: Taylor [name], male   DOB: 1955-12-28, 66 y.o.   MRN: [id number] S: Extubated yesterday and feeling better.  Working with PT.  Foley catheter replaced 01/17/22 due to urinary retention.  Significant improvement of UOP and BUN/Cr. O:BP (!) 151/115   Pulse (!) 128   Temp 98.7 F (37.1 C) (Oral)   Resp (!) 24   Ht 6\' 1"  (1.854 m)   Wt (!) 160.3 kg   SpO2 92%   BMI 46.63 kg/m   Intake/Output Summary (Last 24 hours) at 01/18/2022 1000 Last data filed at 01/18/2022 0604 Gross per 24 hour  Intake 10 ml  Output 2800 ml  Net -2790 ml   Intake/Output: I/O last 3 completed shifts: In: 567.1 [P.O.:120; I.V.:268.8; NG/GT:178.3] Out: 3700 [Urine:3700]  Intake/Output this shift:  No intake/output data recorded. Weight change: -3.9 kg Gen: NAD CVS: tachy Resp:CTA Abd: obese, +BS, soft, TN/ND Ext: 1+ edema BLE  Recent Labs  Lab 01/13/22 2331 01/14/22 0631 01/14/22 0836 01/14/22 1420 01/14/22 1819 01/15/22 0327 01/15/22 0930 01/15/22 1542 01/16/22 0358 01/16/22 1431 01/17/22 0520 01/18/22 0504  NA 132* 132*   < > 132*   < > 132* 135 135 134* 136 138 140  K 4.4 4.9   < > 5.0   < > 3.7 3.6 3.3* 3.9 4.1 3.4* 3.9  CL 92* 94*  --  96*  --  96*  --  99 102 102 106 104  CO2 22 21*  --  19*  --  20*  --  20* 22 23 22 23   GLUCOSE 153* 144*  --  134*  --  108*  --  144* 146* 178* 161* 174*  BUN 69* 66*  --  67*  --  56*  --  48* 33* 29* 35* 37*  CREATININE 9.94* 10.00*   < > 9.45*  --  7.87*  --  6.71* 4.33* 3.58* 3.44* 2.70*  ALBUMIN 3.5 3.1*  --   --   --  2.7*  --  2.4* 2.5* 2.4*  --  2.6*  CALCIUM 7.8* 7.6*  --  7.0*  --  7.4*  --  7.2* 7.3* 8.0* 8.1* 8.7*  PHOS  --   --   --   --   --  7.3*  --  5.2* 3.6 3.3  --  4.4  AST 21 18  --   --   --   --   --   --   --   --   --   --   ALT 18 15  --   --   --   --   --   --   --   --   --   --    < > = values in this interval not displayed.   Liver Function Tests: Recent Labs  Lab 01/13/22 2331 01/14/22 0631  01/15/22 0327 01/16/22 0358 01/16/22 1431 01/18/22 0504  AST 21 18  --   --   --   --   ALT 18 15  --   --   --   --   ALKPHOS 83 71  --   --   --   --   BILITOT 0.7 0.6  --   --   --   --   PROT 7.8 6.9  --   --   --   --   ALBUMIN 3.5 3.1*   < > 2.5* 2.4* 2.6*   < > = values  in this interval not displayed.   Recent Labs  Lab 01/13/22 2331  LIPASE 25   No results for input(s): "AMMONIA" in the last 168 hours. CBC: Recent Labs  Lab 01/13/22 2331 01/14/22 0631 01/14/22 3893 01/15/22 0327 01/15/22 0930 01/16/22 0358 01/17/22 0519 01/18/22 0504  WBC 8.8 9.6  --  8.3  --  7.2 5.6 9.3  NEUTROABS 5.8 6.4  --   --   --   --   --   --   HGB 13.0 12.3*   < > 11.9*   < > 12.0* 11.5* 12.5*  HCT 40.5 37.9*   < > 35.8*   < > 35.6* 34.0* 37.5*  MCV 88.0 88.1  --  85.6  --  83.8 83.7 85.0  PLT 207 205  --  188  --  170 151 164   < > = values in this interval not displayed.   Cardiac Enzymes: No results for input(s): "CKTOTAL", "CKMB", "CKMBINDEX", "TROPONINI" in the last 168 hours. CBG: Recent Labs  Lab 01/17/22 1541 01/17/22 1930 01/17/22 2343 01/18/22 0400 01/18/22 0815  GLUCAP 181* 180* 141* 161* 166*    Iron Studies: No results for input(s): "IRON", "TIBC", "TRANSFERRIN", "FERRITIN" in the last 72 hours. Studies/Results: No results found.  apixaban  5 mg Oral BID   Chlorhexidine Gluconate Cloth  6 each Topical Q0600   docusate sodium  100 mg Oral BID   insulin aspart  0-20 Units Subcutaneous Q4H   lidocaine  1 patch Transdermal Q24H   metoprolol  200 mg Oral Daily   ondansetron  4 mg Oral Once   oxyCODONE  10 mg Oral Q6H   pantoprazole  40 mg Oral Daily   polyethylene glycol  17 g Oral Daily   senna  1 tablet Oral Daily   sodium chloride flush  10-40 mL Intracatheter Q12H   zolpidem  5 mg Oral QHS    BMET    Component Value Date/Time   NA 140 01/18/2022 0504   NA 139 07/17/2021 1148   K 3.9 01/18/2022 0504   CL 104 01/18/2022 0504   CO2 23 01/18/2022  0504   GLUCOSE 174 (H) 01/18/2022 0504   BUN 37 (H) 01/18/2022 0504   BUN 11 07/17/2021 1148   CREATININE 2.70 (H) 01/18/2022 0504   CALCIUM 8.7 (L) 01/18/2022 0504   GFRNONAA 26 (L) 01/18/2022 0504   GFRAA 71 03/25/2020 1258   CBC    Component Value Date/Time   WBC 9.3 01/18/2022 0504   RBC 4.41 01/18/2022 0504   HGB 12.5 (L) 01/18/2022 0504   HGB 13.3 10/16/2020 1046   HCT 37.5 (L) 01/18/2022 0504   HCT 41.4 10/16/2020 1046   PLT 164 01/18/2022 0504   PLT 206 10/16/2020 1046   MCV 85.0 01/18/2022 0504   MCV 88 10/16/2020 1046   MCH 28.3 01/18/2022 0504   MCHC 33.3 01/18/2022 0504   RDW 15.7 (H) 01/18/2022 0504   RDW 15.1 10/16/2020 1046   LYMPHSABS 2.1 01/14/2022 0631   LYMPHSABS 2.7 03/25/2020 1258   MONOABS 0.9 01/14/2022 0631   EOSABS 0.2 01/14/2022 0631   EOSABS 0.2 03/25/2020 1258   BASOSABS 0.0 01/14/2022 0631   BASOSABS 0.1 03/25/2020 1258      Assessment/Plan:  AKI - presumably ischemic ATN in setting of volume depletion and hypotension with concomitant ACE-inhibition.  Unclear if he has been taking NSAIDs for his chronic pain.  Scr was WNL on 07/17/21 but started on Farxiga in April 2023  without repeat renal function panel.  CT scan of abd/pelvis without hydronephrosis, trace perinephric stranding, and small nonobstructive stones.  UNa 13, UCr 209, FeNa 0.47% consistent with pre-renal etiology. CRRT initiated 01/14/22 after seizure like activity and intubation.  All fluids 4K/2.5Ca, pre-filter and post-filter 400 ml/hr, dialysate 1500 mL/hr.  No heparin and to keep even.  UOP has significantly improved to 2.5 liters overnight.  CRRT stopped 8/11.  UOP remains good, and BUN/Cr have improved without CRRT.  Having significant improvement of renal function.  Ok to remove HD catheter.  Should continue to improve.  Will sign off.  Please call with any questions or concerns.  Can follow up in our office if his renal function does not return to baseline (1-1.3) after  discharge. Continue to hold lisinopril, metformin, torsemide, lyrica, and farxiga.  Agree with IVF's.  Strict I's/O's.  Avoid nephrotoxic medications including NSAIDs and iodinated intravenous contrast exposure unless the latter is absolutely indicated.   Preferred narcotic agents for pain control are hydromorphone, fentanyl, and methadone. Morphine should not be used.  Avoid Baclofen and avoid oral sodium phosphate and magnesium citrate based laxatives / bowel preps.  Continue strict Input and Output monitoring.  Will monitor the patient closely with you and intervene or adjust therapy as indicated by changes in clinical status/labs   Urinary retention -  foley catheter replaced 01/17/22.  Will need urology follow up.  Acute metabolic encephalopathy - likely multifactorial with hypercarbia, opiate accumulation as well as lyrica in setting of ARF.  Markedly improved.  AGMA - likely due to metformin and ARF.  Now resolved. Acute hypercarbic and hypoxic respiratory failure - likely due to OHS and opiate accumulation.  Extubated 01/17/22. Hypotension - likely volume depletion given low FeNa.  Currently off levophed gtt.  ECHO with EF 40-45%, unable to determine diastolic parameters. NICM - no evidence of volume overload as above.  Awaiting ECHO Hypovolemic hyponatremia - continue to follow with IVF's Myoclonic jerking - likely due to accumulation of lyrica in setting of ARF.  Now resolved.      Irena Cords, MD [facility]

## 2022-01-18 NOTE — Progress Notes (Addendum)
Family updated at bedside.  Patient is stable for transfer to med tele. He has been signed out to [facility]. They will assume care at 7am 8/14.

## 2022-01-18 NOTE — Progress Notes (Addendum)
eLink Physician-Brief Progress Note Patient Name: Taylor [name] DOB: [birth year] MRN: [id number]   Date of Service  01/18/2022  HPI/Events of Note  Afib/RVR with 157 up to 179    116/92(101)   just received lopressor 100mg  at 21:31 PO  and looks like he received toprol 200mg  at 10:37 but pt  is about to be transferred to a tele floor,      received one time lopressors IV x 2 last night    now is wheezing.  On camera assessment patient is with increased work of breathing On review of chart, intake and output not recorded  eICU Interventions  Stat CXR ordered and if with evidence of volume overload will give diuresis Place on BiPap, may need Precedex for compliance If tachycardia does not improve with BiPap use consider amiodarone Defer transfer to telemetry. Discussed with bedside RN, Bedside CCM team informed     Intervention Category Intermediate Interventions: Respiratory distress - evaluation and management;Arrhythmia - evaluation and management  01/18/2022, 10:27 PM

## 2022-01-19 ENCOUNTER — Inpatient Hospital Stay (HOSPITAL_COMMUNITY): Payer: Medicare Other

## 2022-01-19 DIAGNOSIS — R339 Retention of urine, unspecified: Secondary | ICD-10-CM | POA: Diagnosis not present

## 2022-01-19 DIAGNOSIS — J9602 Acute respiratory failure with hypercapnia: Secondary | ICD-10-CM | POA: Diagnosis not present

## 2022-01-19 DIAGNOSIS — G9340 Encephalopathy, unspecified: Secondary | ICD-10-CM | POA: Diagnosis not present

## 2022-01-19 DIAGNOSIS — N179 Acute kidney failure, unspecified: Secondary | ICD-10-CM | POA: Diagnosis not present

## 2022-01-19 DIAGNOSIS — I959 Hypotension, unspecified: Secondary | ICD-10-CM

## 2022-01-19 DIAGNOSIS — J810 Acute pulmonary edema: Secondary | ICD-10-CM | POA: Diagnosis not present

## 2022-01-19 DIAGNOSIS — J9601 Acute respiratory failure with hypoxia: Secondary | ICD-10-CM | POA: Diagnosis not present

## 2022-01-19 LAB — CBC
HCT: 39.3 % (ref 39.0–52.0)
Hemoglobin: 12.8 g/dL — ABNORMAL LOW (ref 13.0–17.0)
MCH: 28.3 pg (ref 26.0–34.0)
MCHC: 32.6 g/dL (ref 30.0–36.0)
MCV: 86.9 fL (ref 80.0–100.0)
Platelets: 143 10*3/uL — ABNORMAL LOW (ref 150–400)
RBC: 4.52 MIL/uL (ref 4.22–5.81)
RDW: 15.9 % — ABNORMAL HIGH (ref 11.5–15.5)
WBC: 8.6 10*3/uL (ref 4.0–10.5)
nRBC: 0.2 % (ref 0.0–0.2)

## 2022-01-19 LAB — BASIC METABOLIC PANEL
Anion gap: 12 (ref 5–15)
Anion gap: 13 (ref 5–15)
BUN: 41 mg/dL — ABNORMAL HIGH (ref 8–23)
BUN: 42 mg/dL — ABNORMAL HIGH (ref 8–23)
CO2: 20 mmol/L — ABNORMAL LOW (ref 22–32)
CO2: 24 mmol/L (ref 22–32)
Calcium: 8.9 mg/dL (ref 8.9–10.3)
Calcium: 9.2 mg/dL (ref 8.9–10.3)
Chloride: 104 mmol/L (ref 98–111)
Chloride: 105 mmol/L (ref 98–111)
Creatinine, Ser: 2.31 mg/dL — ABNORMAL HIGH (ref 0.61–1.24)
Creatinine, Ser: 2.38 mg/dL — ABNORMAL HIGH (ref 0.61–1.24)
GFR, Estimated: 30 mL/min — ABNORMAL LOW (ref >60)
GFR, Estimated: 31 mL/min — ABNORMAL LOW (ref >60)
Glucose, Bld: 183 mg/dL — ABNORMAL HIGH (ref 70–99)
Glucose, Bld: 221 mg/dL — ABNORMAL HIGH (ref 70–99)
Potassium: 3.9 mmol/L (ref 3.5–5.1)
Potassium: 4.4 mmol/L (ref 3.5–5.1)
Sodium: 138 mmol/L (ref 135–145)
Sodium: 140 mmol/L (ref 135–145)

## 2022-01-19 LAB — GLUCOSE, CAPILLARY
Glucose-Capillary: 169 mg/dL — ABNORMAL HIGH (ref 70–99)
Glucose-Capillary: 171 mg/dL — ABNORMAL HIGH (ref 70–99)
Glucose-Capillary: 217 mg/dL — ABNORMAL HIGH (ref 70–99)
Glucose-Capillary: 185 mg/dL — ABNORMAL HIGH (ref 70–99)
Glucose-Capillary: 192 mg/dL — ABNORMAL HIGH (ref 70–99)

## 2022-01-19 LAB — CULTURE, BLOOD (ROUTINE X 2)
Culture: NO GROWTH
Special Requests: ADEQUATE

## 2022-01-19 LAB — MAGNESIUM: Magnesium: 2.3 mg/dL (ref 1.7–2.4)

## 2022-01-19 MED ORDER — TAMSULOSIN HCL 0.4 MG PO CAPS
0.4000 mg | ORAL_CAPSULE | Freq: Every day | ORAL | Status: DC
  Administered 2022-01-19 – 2022-01-26 (×8): 0.4 mg via ORAL
  Filled 2022-01-19 (×8): qty 1

## 2022-01-19 MED ORDER — POTASSIUM CHLORIDE CRYS ER 20 MEQ PO TBCR
40.0000 meq | EXTENDED_RELEASE_TABLET | Freq: Once | ORAL | Status: AC
  Administered 2022-01-19: 40 meq via ORAL
  Filled 2022-01-19: qty 2

## 2022-01-19 MED ORDER — INSULIN GLARGINE-YFGN 100 UNIT/ML ~~LOC~~ SOLN
10.0000 [IU] | Freq: Every day | SUBCUTANEOUS | Status: DC
  Administered 2022-01-19 – 2022-01-20 (×2): 10 [IU] via SUBCUTANEOUS
  Filled 2022-01-19 (×4): qty 0.1

## 2022-01-19 MED ORDER — FUROSEMIDE 10 MG/ML IJ SOLN
40.0000 mg | Freq: Once | INTRAMUSCULAR | Status: AC
Start: 2022-01-19 — End: 2022-01-19
  Administered 2022-01-19: 40 mg via INTRAVENOUS
  Filled 2022-01-19: qty 4

## 2022-01-19 MED ORDER — POTASSIUM CHLORIDE 10 MEQ/100ML IV SOLN
10.0000 meq | INTRAVENOUS | Status: AC
  Administered 2022-01-19 (×2): 10 meq via INTRAVENOUS
  Filled 2022-01-19 (×2): qty 100

## 2022-01-19 NOTE — Inpatient Diabetes Management (Signed)
Inpatient Diabetes Program Recommendations  AACE/ADA: New Consensus Statement on Inpatient Glycemic Control   Target Ranges:  Prepandial:   less than 140 mg/dL      Peak postprandial:   less than 180 mg/dL (1-2 hours)      Critically ill patients:  140 - 180 mg/dL    Latest Reference Range & Units 01/18/22 08:15 01/18/22 11:33 01/18/22 15:36 01/18/22 19:37 01/18/22 23:43 01/19/22 03:29  Glucose-Capillary 70 - 99 mg/dL 315 (H) 945 (H) 859 (H) 214 (H) 212 (H) 169 (H)   Review of Glycemic Control  Diabetes history: DM2 Outpatient Diabetes medications: Glipizide 10 mg daily, Metformin 1000 mg BID, Actos 45 mg daily, Farxiga 10 mg daily Current orders for Inpatient glycemic control: Novolog 0-20 units Q4H  Inpatient Diabetes Program Recommendations:    Insulin: Please consider ordering Semglee 10 units Q24H.  Outpatient DM medications: Recommend Actos be discontinued as outpatient due to side effect of fluid retention (patient has hx of CHF).  Thanks, Orlando Penner, RN, MSN, CDCES Diabetes Coordinator Inpatient Diabetes Program [phone] (Team Pager from 8am to 5pm)

## 2022-01-19 NOTE — Progress Notes (Addendum)
Progress Note  Patient: Taylor [name] DOB: 1955-11-15  DOA: 01/13/2022  DOS: 01/19/2022    Brief hospital course: Taylor [name] is a 66 y.o. male with a history of NICM, chronic HFrEF (LVEF 35-40%), CAD, permanent atrial fibrillation s/p failed DCCV, HTN, HLD, T2DM, morbid obesity, and chronic back pain on oxycodone, dilaudid, lyrica who presented to the ED 8/8 with lethargy, nausea. He was encephalopathic with leg edema, in renal failure with creatinine of 9.94 from baseline of 1.04, hypotensive requiring pressors. He was transferred from [facility] ICU and underwent CRRT 8/9 - 8/11, extubated 8/12. Neurology was involved for concern of generalized tonic-clonic seizure which was treated with depakote. EEG and MRI were negative, so this was stopped. Lyrica has been discontinued as well.   Hospitalization complicated by acute urinary retention, RVR and pulmonary edema, though this is improving and he is felt stable for transfer to medical service to pick up 8/4.   Assessment and Plan: Acute hypoxic respiratory failure: Pt states he's been told he needed oxygen in the past but never pursued it.  - Wean oxygen as tolerated with diuresis as below.  - OOB, IS encouraged. PT to see today.   Acute on chronic HFrEF: With pulmonary edema, likely related to rapid atrial fibrillation. Repeat echo pending. - Continue IV diuresis, monitor I/O, weights. Home torsemide is 40mg  qAM, 20mg  qPM - Will discuss with cardiology (sees Dr. ) - Holding SGLT2i, ACEi in setting of renal failure  Permanent atrial fibrillation with RVR: s/p failed DCCV March and Sept 2022.  - Continue metoprolol, will increase dose today.  - Continue DOAC - Said not to be RFA candidate due to morbid obesity.  - Supp K to keep at goal 4. Note TSH 3.762.  Acute toxic and metabolic encephalopathy: Due to polypharmacy in the setting of renal failure and uremia. - Has improved, still a bit altered. continue  delirium precautions, optimizing metabolic parameters, holding sedating meds: (oxycontin) and reduced ambien to 5mg  and dilaudid dose to 2mg   Acute renal failure due to ATN: Resolved, s/p CRRT 8/9 - 8/11 with subsequent good UOP and Cr declining.  - Monitor UOP   Morbid obesity: Body mass index is 45.87 kg/m.  - Pt's PCP has discussed ozempic, will plan to trial (has had rybelsus intolerance (nausea)).   Acute urinary retention:  - Continue foley for now given IV diuresis and failed voiding trial.  - Continue new tamsulosin  Chronic normocytic anemia:  - Mild, monitor while on anticoagulation  Thrombocytopenia: Unclear etiology, mild.  - Recheck in AM   HTN: Stable  HLD:  - Holding statin for now  T2DM:  - Continue SSI. Now that po intake is picking up, will add mealtime coverage. Reluctant to initiate basal insulin with fasting CBGs at goal and recent renal failure.  Subjective: A bit confused over past 24 hours, thought he was taken to the 3rd floor but was not moved from the room. Recognizes RN and denies current CP, dyspnea. Has pain in penis he associates with urination since the foley catheter had to be reinserted. No abd pain. Was anxious overnight associated with rapid heart rate, placed on precedex which has been weaned off.  Objective: Vitals:   01/19/22 0900 01/19/22 1000 01/19/22 1100 01/19/22 1200  BP: 118/82 113/75 (!) 105/90 120/71  Pulse: 76 70  99  Resp: 19 (!) 22 (!) 25 (!) 26  Temp:    97.7 F (36.5 C)  TempSrc:  Oral  SpO2: 98% 93%  (!) 85%  Weight:      Height:       Gen: Obese 66 y.o. male in no distress Pulm: Nonlabored breathing supplemental oxygen with bibasilar crackles, no wheezes.  CV: Irreg w/rate in 100's, no murmur, rub, or gallop. No definite JVD, 2+ pitting dependent edema. GI: Abdomen soft, non-tender, non-distended, with normoactive bowel sounds.  GU: Foley in place Ext: Warm, no deformities Skin: No rashes, lesions or ulcers on  visualized skin. Neuro: Alert, responds appropriately, follows commands. No asterixis or focal neurological deficits. Psych: Judgement and insight appear marginal. Mood euthymic & affect congruent. Behavior is appropriate.    Data Personally reviewed: CBC: Recent Labs  Lab 01/13/22 2331 01/14/22 0631 01/14/22 4481 01/15/22 0327 01/15/22 0930 01/16/22 0358 01/17/22 0519 01/18/22 0504 01/19/22 0712  WBC 8.8 9.6  --  8.3  --  7.2 5.6 9.3 8.6  NEUTROABS 5.8 6.4  --   --   --   --   --   --   --   HGB 13.0 12.3*   < > 11.9* 11.9* 12.0* 11.5* 12.5* 12.8*  HCT 40.5 37.9*   < > 35.8* 35.0* 35.6* 34.0* 37.5* 39.3  MCV 88.0 88.1  --  85.6  --  83.8 83.7 85.0 86.9  PLT 207 205  --  188  --  170 151 164 143*   < > = values in this interval not displayed.   Basic Metabolic Panel: Recent Labs  Lab 01/14/22 2030 01/14/22 2128 01/15/22 0327 01/15/22 0930 01/15/22 1542 01/16/22 0358 01/16/22 1431 01/17/22 0520 01/18/22 0504 01/18/22 2334 01/19/22 0712  NA  --    < > 132*   < > 135 134* 136 138 140 138 140  K  --    < > 3.7   < > 3.3* 3.9 4.1 3.4* 3.9 4.4 3.9  CL  --   --  96*  --  99 102 102 106 104 105 104  CO2  --   --  20*  --  20* 22 23 22 23  20* 24  GLUCOSE  --   --  108*  --  144* 146* 178* 161* 174* 221* 183*  BUN  --   --  56*  --  48* 33* 29* 35* 37* 41* 42*  CREATININE  --   --  7.87*  --  6.71* 4.33* 3.58* 3.44* 2.70* 2.38* 2.31*  CALCIUM  --   --  7.4*  --  7.2* 7.3* 8.0* 8.1* 8.7* 9.2 8.9  MG 2.3  --  2.1  --   --  2.3  --  2.4  --  2.3  --   PHOS  --   --  7.3*  --  5.2* 3.6 3.3  --  4.4  --   --    < > = values in this interval not displayed.   GFR: Estimated Creatinine Clearance: 50.7 mL/min (A) (by C-G formula based on SCr of 2.31 mg/dL (H)). Liver Function Tests: Recent Labs  Lab 01/13/22 2331 01/14/22 0631 01/15/22 0327 01/15/22 1542 01/16/22 0358 01/16/22 1431 01/18/22 0504  AST 21 18  --   --   --   --   --   ALT 18 15  --   --   --   --   --    ALKPHOS 83 71  --   --   --   --   --   BILITOT 0.7 0.6  --   --   --   --   --  PROT 7.8 6.9  --   --   --   --   --   ALBUMIN 3.5 3.1* 2.7* 2.4* 2.5* 2.4* 2.6*   Recent Labs  Lab 01/13/22 2331  LIPASE 25   No results for input(s): "AMMONIA" in the last 168 hours. Coagulation Profile: Recent Labs  Lab 01/14/22 0631  INR 1.4*   Cardiac Enzymes: No results for input(s): "CKTOTAL", "CKMB", "CKMBINDEX", "TROPONINI" in the last 168 hours. BNP (last 3 results) No results for input(s): "PROBNP" in the last 8760 hours. HbA1C: No results for input(s): "HGBA1C" in the last 72 hours. CBG: Recent Labs  Lab 01/18/22 1937 01/18/22 2343 01/19/22 0329 01/19/22 0804 01/19/22 1149  GLUCAP 214* 212* 169* 171* 217*   Lipid Profile: No results for input(s): "CHOL", "HDL", "LDLCALC", "TRIG", "CHOLHDL", "LDLDIRECT" in the last 72 hours. Thyroid Function Tests: No results for input(s): "TSH", "T4TOTAL", "FREET4", "T3FREE", "THYROIDAB" in the last 72 hours. Anemia Panel: No results for input(s): "VITAMINB12", "FOLATE", "FERRITIN", "TIBC", "IRON", "RETICCTPCT" in the last 72 hours. Urine analysis:    Component Value Date/Time   COLORURINE YELLOW 01/14/2022 0631   APPEARANCEUR HAZY (A) 01/14/2022 0631   LABSPEC 1.016 01/14/2022 0631   PHURINE 5.0 01/14/2022 0631   GLUCOSEU 50 (A) 01/14/2022 0631   HGBUR MODERATE (A) 01/14/2022 0631   BILIRUBINUR NEGATIVE 01/14/2022 0631   KETONESUR NEGATIVE 01/14/2022 0631   PROTEINUR 100 (A) 01/14/2022 0631   NITRITE NEGATIVE 01/14/2022 0631   LEUKOCYTESUR NEGATIVE 01/14/2022 0631   Recent Results (from the past 240 hour(s))  Blood culture (routine x 2)     Status: None   Collection Time: 01/13/22 11:31 PM   Specimen: BLOOD RIGHT FOREARM  Result Value Ref Range Status   Specimen Description BLOOD RIGHT FOREARM  Final   Special Requests   Final    BOTTLES DRAWN AEROBIC AND ANAEROBIC Blood Culture adequate volume   Culture   Final    NO GROWTH  5 DAYS Performed at [facility], [address], [place], [place] [address]    Report Status 01/18/2022 FINAL  Final  Blood culture (routine x 2)     Status: None   Collection Time: 01/14/22 12:15 AM   Specimen: Left Antecubital; Blood  Result Value Ref Range Status   Specimen Description LEFT ANTECUBITAL  Final   Special Requests   Final    BOTTLES DRAWN AEROBIC AND ANAEROBIC Blood Culture adequate volume   Culture   Final    NO GROWTH 5 DAYS Performed at [facility], [address], [place], [place] [address]    Report Status 01/19/2022 FINAL  Final  SARS Coronavirus 2 by RT PCR (hospital order, performed in [facility] hospital lab) *cepheid single result test* Anterior Nasal Swab     Status: None   Collection Time: 01/14/22  5:21 AM   Specimen: Anterior Nasal Swab  Result Value Ref Range Status   SARS Coronavirus 2 by RT PCR NEGATIVE NEGATIVE Final    Comment: (NOTE) SARS-CoV-2 target nucleic acids are NOT DETECTED.  The SARS-CoV-2 RNA is generally detectable in upper and lower respiratory specimens during the acute phase of infection. The lowest concentration of SARS-CoV-2 viral copies this assay can detect is 250 copies / mL. A negative result does not preclude SARS-CoV-2 infection and should not be used as the sole basis for treatment or other patient management decisions.  A negative result may occur with improper specimen collection / handling, submission of specimen other than nasopharyngeal swab, presence of  viral mutation(s) within the areas targeted by this assay, and inadequate number of viral copies (<250 copies / mL). A negative result must be combined with clinical observations, patient history, and epidemiological information.  Fact Sheet for Patients:   RoadLapTop.co.za  Fact Sheet for Healthcare Providers: http://kim-miller.com/  This test is not yet approved or  cleared by the [place] FDA and has been  authorized for detection and/or diagnosis of SARS-CoV-2 by FDA under an Emergency Use Authorization (EUA).  This EUA will remain in effect (meaning this test can be used) for the duration of the COVID-19 declaration under Section 564(b)(1) of the Act, 21 U.S.C. section 360bbb-3(b)(1), unless the authorization is terminated or revoked sooner.  Performed at [facility], [address] [place], [place] [address]   MRSA Next Gen by PCR, Nasal     Status: None   Collection Time: 01/14/22  6:30 AM   Specimen: Nasal Mucosa; Nasal Swab  Result Value Ref Range Status   MRSA by PCR Next Gen NOT DETECTED NOT DETECTED Final    Comment: (NOTE) The GeneXpert MRSA Assay (FDA approved for NASAL specimens only), is one component of a comprehensive MRSA colonization surveillance program. It is not intended to diagnose MRSA infection nor to guide or monitor treatment for MRSA infections. Test performance is not FDA approved in patients less than 45 years old. Performed at [facility], [address], [place], [place] [address]   Culture, Respiratory w Gram Stain     Status: None   Collection Time: 01/14/22  5:02 PM   Specimen: Tracheal Aspirate; Respiratory  Result Value Ref Range Status   Specimen Description TRACHEAL ASPIRATE  Final   Special Requests NONE  Final   Gram Stain   Final    NO ORGANISMS SEEN MODERATE WBC PRESENT, PREDOMINANTLY PMN    Culture   Final    Normal respiratory flora-no Staph aureus or Pseudomonas seen Performed at [facility], [address], [place], [place] [address]    Report Status 01/17/2022 FINAL  Final     DG CHEST PORT 1 VIEW  Result Date: 01/19/2022 CLINICAL DATA:  Shortness of breath EXAM: PORTABLE CHEST 1 VIEW COMPARISON:  Previous studies including the examination of 01/18/2022 FINDINGS: Transverse diameter of heart is increased. There is partial clearing of interstitial pulmonary edema. No new focal infiltrates are seen. Left lateral  CP angle is not included in the image. There is no pneumothorax. IMPRESSION: Partial clearing of pulmonary vascular congestion and pulmonary edema. Electronically Signed   By: Ernie Avena M.D.   On: 01/19/2022 08:10   DG CHEST PORT 1 VIEW  Result Date: 01/18/2022 CLINICAL DATA:  Respiratory distress EXAM: PORTABLE CHEST 1 VIEW COMPARISON:  01/14/2022 FINDINGS: Tubes and lines have been removed in the interval from the prior exam. Cardiac shadow is enlarged. Diffuse vascular congestion is noted with interstitial edema bilaterally. The lung bases are not included on this image. IMPRESSION: Changes of CHF with interstitial edema. Electronically Signed   By: Alcide Clever M.D.   On: 01/18/2022 23:02    Family Communication: None at bedside  Disposition: Status is: Inpatient Remains inpatient appropriate because: Severity of illness, IV diuresis, AMS Planned Discharge Destination:  TBD  Tyrone Nine, MD 01/19/2022 1:10 PM Page by [email or web address]

## 2022-01-19 NOTE — Evaluation (Signed)
Occupational Therapy Evaluation Patient Details Name: Taylor [name] MRN: [id number] DOB: [birth year] Today's Date: 01/19/2022   History of Present Illness 66 year old who presented with increased lethargy and found with AKI requiring dialysis; Started CRRT 8/9 and held CRRT 8/11, monitoring for renal recovery; intubated 8/9- 8/12. 8/13 developed afib.  PMH includes: HTN, chronic systolic heart failure, atrial fibrillation, DM2 and chronic pain medications on home dilaudid, oxycontin, lyrica, ambien   Clinical Impression   PTA patient reports independent and driving, sedentary but able to care for himself. Admitted for above and presents with problem list below, including impaired balance, decreased activity tolerance and generalized weakness.  Pt appears mildly confused, reports today as Saturday and tends to have difficulty with awareness and problem solving requiring cueing. He completes Adls with setup to mod assist, transfers with min assist fading to min guard +2 safety, and mobility with up to min assist +2 safety. His HR becomes tachy with activity up to 152 when recovering, able to maintain Spo2 on RA.  Based on performance today, pt will benefit from further OT services acutely and after dc at [facility] to optimize independence, safety and return to PLOF.      Recommendations for follow up therapy are one component of a multi-disciplinary discharge planning process, led by the attending physician.  Recommendations may be updated based on patient status, additional functional criteria and insurance authorization.   Follow Up Recommendations  Home health OT    Assistance Recommended at Discharge Intermittent Supervision/Assistance  Patient can return home with the following A little help with walking and/or transfers;A lot of help with bathing/dressing/bathroom;Assistance with cooking/housework;Direct supervision/assist for medications management;Direct supervision/assist for financial  management;Assist for transportation;Help with stairs or ramp for entrance    Functional Status Assessment  Patient has had a recent decline in their functional status and demonstrates the ability to make significant improvements in function in a reasonable and predictable amount of time.  Equipment Recommendations  BSC/3in1 (bariatric)    Recommendations for Other Services       Precautions / Restrictions Precautions Precautions: Fall Precaution Comments: watch HR Restrictions Weight Bearing Restrictions: No      Mobility Bed Mobility Overal bed mobility: Needs Assistance Bed Mobility: Supine to Sit     Supine to sit: Min guard     General bed mobility comments: increased time, no physical assist required. increased cueing for initation    Transfers Overall transfer level: Needs assistance Equipment used: 2 person hand held assist Transfers: Sit to/from Stand Sit to Stand: Min assist, Min guard           General transfer comment: min assist fading to min guard from EOB      Balance Overall balance assessment: Needs assistance Sitting-balance support: No upper extremity supported, Feet supported Sitting balance-Leahy Scale: Fair     Standing balance support: Bilateral upper extremity supported, During functional activity Standing balance-Leahy Scale: Poor Standing balance comment: relies on at least 1 UE support                           ADL either performed or assessed with clinical judgement   ADL Overall ADL's : Needs assistance/impaired     Grooming: Set up;Sitting           Upper Body Dressing : Minimal assistance;Sitting   Lower Body Dressing: Sit to/from stand;Moderate assistance Lower Body Dressing Details (indicate cue type and reason): typically wears slip on shoes  Toilet Transfer: Minimal assistance;Ambulation;+2 for safety/equipment           Functional mobility during ADLs: Minimal assistance;+2 for safety/equipment        Vision   Vision Assessment?: No apparent visual deficits     Perception     Praxis      Pertinent Vitals/Pain Pain Assessment Pain Assessment: Faces Faces Pain Scale: Hurts a little bit Pain Location: chronic low back pain Pain Descriptors / Indicators: Grimacing Pain Intervention(s): Limited activity within patient's tolerance, Monitored during session, Repositioned     Hand Dominance Right   Extremity/Trunk Assessment Upper Extremity Assessment Upper Extremity Assessment: Generalized weakness   Lower Extremity Assessment Lower Extremity Assessment: Defer to PT evaluation   Cervical / Trunk Assessment Cervical / Trunk Assessment: Other exceptions Cervical / Trunk Exceptions: large body habitus, trunk girth   Communication Communication Communication: HOH   Cognition Arousal/Alertness: Awake/alert Behavior During Therapy: WFL for tasks assessed/performed, Impulsive Overall Cognitive Status: Impaired/Different from baseline Area of Impairment: Memory, Orientation, Awareness, Problem solving, Attention, Following commands                 Orientation Level: Disoriented to, Time (reports saturday) Current Attention Level: Sustained Memory: Decreased recall of precautions, Decreased short-term memory Following Commands: Follows one step commands consistently, Follows one step commands with increased time, Follows multi-step commands inconsistently   Awareness: Emergent Problem Solving: Slow processing, Requires verbal cues, Decreased initiation, Difficulty sequencing General Comments: pt reports todays date is saturday, but aware of situation and place.  His brother corrects some home setup and patient appears with some problem solving difficutly. Continue assessment     General Comments  pt fatigues easily, HR tachy up to 138 during activity and then up to 152 once sitting to rest.  He is on RA during activity with Spo2 maintained.    Exercises      Shoulder Instructions      Home Living Family/patient expects to be discharged to:: Private residence Living Arrangements: Parent Available Help at Discharge: Family Type of Home: House Home Access: Stairs to enter Secretary/administrator of Steps: 2 Entrance Stairs-Rails: Right Home Layout: One level     Bathroom Shower/Tub: IT trainer: Standard     Home Equipment: Grab bars - tub/shower   Additional Comments: brother present and correcting home setup for pt      Prior Functioning/Environment Prior Level of Function : Independent/Modified Independent;Driving             Mobility Comments: Typically walks household distances; Sedentary lifestyle ADLs Comments: Reports usually doesn't donn socks, puts on slip on shoes; Pt's mother performs household management, cooking, cleaning        OT Problem List: Decreased strength;Decreased activity tolerance;Impaired balance (sitting and/or standing);Decreased cognition;Decreased safety awareness;Decreased knowledge of use of DME or AE;Decreased knowledge of precautions;Obesity;Pain      OT Treatment/Interventions: Self-care/ADL training;Therapeutic exercise;DME and/or AE instruction;Therapeutic activities;Balance training;Patient/family education;Cognitive remediation/compensation;Energy conservation    OT Goals(Current goals can be found in the care plan section) Acute Rehab OT Goals Patient Stated Goal: home OT Goal Formulation: With patient Time For Goal Achievement: 02/02/22 Potential to Achieve Goals: Good  OT Frequency: Min 2X/week    Co-evaluation PT/OT/SLP Co-Evaluation/Treatment: Yes Reason for Co-Treatment: For patient/therapist safety;To address functional/ADL transfers   OT goals addressed during session: ADL's and self-care      AM-PAC OT "6 Clicks" Daily Activity     Outcome Measure Help from another person eating meals?: None Help from  another person taking care of  personal grooming?: A Little Help from another person toileting, which includes using toliet, bedpan, or urinal?: A Lot Help from another person bathing (including washing, rinsing, drying)?: A Lot Help from another person to put on and taking off regular upper body clothing?: A Little Help from another person to put on and taking off regular lower body clothing?: A Lot 6 Click Score: 16   End of Session Equipment Utilized During Treatment: Oxygen Nurse Communication: Mobility status  Activity Tolerance: Patient tolerated treatment well Patient left: in chair;with call bell/phone within reach;with family/visitor present  OT Visit Diagnosis: Other abnormalities of gait and mobility (R26.89);Muscle weakness (generalized) (M62.81);Other symptoms and signs involving cognitive function                Time: 1442-1510 OT Time Calculation (min): 28 min Charges:  OT General Charges $OT Visit: 1 Visit OT Evaluation $OT Eval Moderate Complexity: 1 Mod  Barry Brunner, OT Acute Rehabilitation Services Office [phone]'  Chancy Milroy 01/19/2022, 3:50 PM

## 2022-01-19 NOTE — Progress Notes (Signed)
Physical Therapy Treatment Patient Details Name: Taylor [name] MRN: [id number] DOB: [birth year] Today's Date: 01/19/2022   History of Present Illness 66 year old who presented with increased lethargy and found with AKI requiring dialysis; Started CRRT 8/9 and held CRRT 8/11, monitoring for renal recovery; intubated 8/9- 8/12. 8/13 developed afib.  PMH includes: HTN, chronic systolic heart failure, atrial fibrillation, DM2 and chronic pain medications on home dilaudid, oxycontin, lyrica, ambien    PT Comments    Pt steadily progressing toward goals, limited by weakness, decreased stamina and deconditioning.  Emphasis on transitions, sit to stand safety and/stability and short distance ambulation    Recommendations for follow up therapy are one component of a multi-disciplinary discharge planning process, led by the attending physician.  Recommendations may be updated based on patient status, additional functional criteria and insurance authorization.  Follow Up Recommendations  Home health PT     Assistance Recommended at Discharge Intermittent Supervision/Assistance  Patient can return home with the following A little help with walking and/or transfers;A little help with bathing/dressing/bathroom;Assistance with cooking/housework;Assist for transportation   Equipment Recommendations  Rolling walker (2 wheels);BSC/3in1    Recommendations for Other Services       Precautions / Restrictions Precautions Precautions: Fall Precaution Comments: watch HR Restrictions Weight Bearing Restrictions: No     Mobility  Bed Mobility Overal bed mobility: Needs Assistance Bed Mobility: Supine to Sit     Supine to sit: Min guard     General bed mobility comments: increased time, no physical assist required. increased cueing for initation    Transfers Overall transfer level: Needs assistance Equipment used: 2 person hand held assist Transfers: Sit to/from Stand Sit to Stand: Min  assist, Min guard           General transfer comment: min assist fading to min guard from EOB    Ambulation/Gait Ambulation/Gait assistance: Min assist Gait Distance (Feet): 25 Feet Assistive device: 1 person hand held assist Gait Pattern/deviations: Step-through pattern   Gait velocity interpretation: <1.31 ft/sec, indicative of household ambulator   General Gait Details: mildly unsteady, labored gait, not dyspneic, but notably fatigued with HR rising/sustained in the 130's, SpO2 in lower/mid 90's   Stairs             Wheelchair Mobility    Modified Rankin (Stroke Patients Only)       Balance Overall balance assessment: Needs assistance Sitting-balance support: No upper extremity supported, Feet supported Sitting balance-Leahy Scale: Fair     Standing balance support: Bilateral upper extremity supported, During functional activity Standing balance-Leahy Scale: Poor Standing balance comment: relies on at least 1 UE support                            Cognition Arousal/Alertness: Awake/alert Behavior During Therapy: WFL for tasks assessed/performed, Impulsive Overall Cognitive Status: Impaired/Different from baseline Area of Impairment: Memory, Orientation, Awareness, Problem solving, Attention, Following commands                 Orientation Level: Disoriented to, Time (reports saturday) Current Attention Level: Sustained Memory: Decreased recall of precautions, Decreased short-term memory Following Commands: Follows one step commands consistently, Follows one step commands with increased time, Follows multi-step commands inconsistently   Awareness: Emergent Problem Solving: Slow processing, Requires verbal cues, Decreased initiation, Difficulty sequencing General Comments: pt reports todays date is saturday, but aware of situation and place.  His brother corrects some home setup and patient appears with  some problem solving difficutly.  Continue assessment        Exercises      General Comments General comments (skin integrity, edema, etc.): pt fatigues easily, HR tachy up to 138 during activity and then up to 152 once sitting to rest.  He is on RA during activity with Spo2 maintained.      Pertinent Vitals/Pain Pain Assessment Pain Assessment: Faces Faces Pain Scale: Hurts a little bit Pain Location: chronic low back pain Pain Descriptors / Indicators: Grimacing Pain Intervention(s): Limited activity within patient's tolerance    Home Living Family/patient expects to be discharged to:: Private residence Living Arrangements: Parent Available Help at Discharge: Family Type of Home: House Home Access: Stairs to enter Entrance Stairs-Rails: Right Entrance Stairs-Number of Steps: 2   Home Layout: One level Home Equipment: Grab bars - tub/shower Additional Comments: brother present and correcting home setup for pt    Prior Function            PT Goals (current goals can now be found in the care plan section) Acute Rehab PT Goals PT Goal Formulation: With patient Time For Goal Achievement: 02/01/22 Potential to Achieve Goals: Good Progress towards PT goals: Progressing toward goals    Frequency    Min 3X/week      PT Plan Current plan remains appropriate    Co-evaluation PT/OT/SLP Co-Evaluation/Treatment: Yes Reason for Co-Treatment: For patient/therapist safety PT goals addressed during session: Mobility/safety with mobility OT goals addressed during session: ADL's and self-care      AM-PAC PT "6 Clicks" Mobility   Outcome Measure  Help needed turning from your back to your side while in a flat bed without using bedrails?: A Little Help needed moving from lying on your back to sitting on the side of a flat bed without using bedrails?: A Little Help needed moving to and from a bed to a chair (including a wheelchair)?: A Little Help needed standing up from a chair using your arms (e.g.,  wheelchair or bedside chair)?: A Little Help needed to walk in hospital room?: A Little Help needed climbing 3-5 steps with a railing? : A Little 6 Click Score: 18    End of Session   Activity Tolerance: Patient tolerated treatment well Patient left: in chair;with call bell/phone within reach Nurse Communication: Mobility status PT Visit Diagnosis: Other abnormalities of gait and mobility (R26.89);Difficulty in walking, not elsewhere classified (R26.2)     Time: 6387-5643 PT Time Calculation (min) (ACUTE ONLY): 28 min  Charges:  $Gait Training: 8-22 mins                     01/19/2022  Jacinto Halim., PT Acute Rehabilitation Services [phone]  (pager) [phone]  (office)   Eliseo Gum Aashir Umholtz 01/19/2022, 4:55 PM

## 2022-01-19 NOTE — Progress Notes (Signed)
NAME:  Taylor [name], MRN:  [id number], DOB:  January 17, 1956, LOS: 5 ADMISSION DATE:  01/13/2022, CONSULTATION DATE:  01/14/22 REFERRING MD:  [name] , CHIEF COMPLAINT:  AMS    History of Present Illness:  66 yo man with HTN, CHF ef 35-40%, HLD, afib on eliquis, DM2, obesity, back pain (on ambien, oxycodone, dilaudid, lyrica.  Presenting with AMS, new renal failure.   Recently returned from [facility]. Since then has been excessively sleepy and fatigued with associated nausea. This is rapid change from basleine. Last took home meds yesterday 5pm. Denies pain. Arousable, only answers simple questions.  Falls asleep.    Transferred to [name] ED (no ICU beds).    In ED found to have B LE edema.   Hypotensive. Given LR 500cc  x 2.  Levophed.  Transferred to ICU.  Pertinent  Medical History   Past Medical History:  Diagnosis Date   Arthritis    Broken leg 02/07/2019   Depression    Diabetes mellitus without complication ([place])    Hypertension    No current facility-administered medications on file prior to encounter.   Current Outpatient Medications on File Prior to Encounter  Medication Sig Dispense Refill   apixaban (ELIQUIS) 5 MG TABS tablet TAKE 1 TABLET BY MOUTH TWICE DAILY 60 tablet 5   dapagliflozin propanediol (FARXIGA) 10 MG TABS tablet Take 1 tablet (10 mg total) by mouth daily before breakfast. 21 tablet 0   glipiZIDE (GLUCOTROL XL) 10 MG 24 hr tablet TAKE 1 TABLET BY MOUTH ONCE DAILY. 90 tablet 4   glucose blood (ONETOUCH ULTRA) test strip Use to check sugar daily for type 2 diabetes E11.9 100 each 4   HYDROmorphone (DILAUDID) 4 MG tablet Take 4 mg by mouth in the morning, at noon, in the evening, and at bedtime. Must be name brand     lisinopril (ZESTRIL) 10 MG tablet TAKE 1 TABLET BY MOUTH ONCE DAILY. 90 tablet 0   metFORMIN (GLUCOPHAGE) 1000 MG tablet TAKE 1 TABLET BY MOUTH TWICE DAILY 180 tablet 4   metoprolol (TOPROL XL) 200 MG 24 hr tablet Take 1 tablet (200 mg total) by mouth  daily. 30 tablet 5   MOVANTIK 25 MG TABS tablet      NARCAN 4 MG/0.1ML LIQD nasal spray kit Place 0.4 mg into the nose as needed (accidental overdose).     OXYCONTIN 20 MG 12 hr tablet Take 20 mg by mouth in the morning and at bedtime. Take 20 mg by mouth four times a day     pioglitazone (ACTOS) 45 MG tablet TAKE 1 TABLET BY MOUTH ONCE DAILY. 90 tablet 4   potassium chloride SA (KLOR-CON) 20 MEQ tablet Take 1 tablet (20 mEq total) by mouth daily. 90 tablet 1   pregabalin (LYRICA) 100 MG capsule Take 100 mg by mouth 3 (three) times daily.     simvastatin (ZOCOR) 40 MG tablet TAKE 1 TABLET BY MOUTH ONCE DAILY. 90 tablet 0   torsemide (DEMADEX) 20 MG tablet Take 40 MG (2 tabs) every AM, and Take 20 MG (1 tab) every PM. 90 tablet 3   vitamin B-12 (CYANOCOBALAMIN) 1000 MCG tablet Take 1,000 mcg by mouth daily.     zolpidem (AMBIEN CR) 12.5 MG CR tablet Take 12.5 mg by mouth at bedtime.        Significant Hospital Events: Including procedures, antibiotic start and stop dates in addition to other pertinent events   8/9 admitted to ICU 8/9 intubated and HD cath  placed after witnessed myoclonic jerking 8/11 precedex and oxycodone, CRRT dc 8/12 extubated 8/13 Afib with RVR, respiratory distress not tolerating BiPAP, transferred to triad 8/14 on Precedex  Interim History / Subjective:  HRs in 140-170s overnight. Noted to be respiratory distress. He was not able to tolerate BiPAP. Lasix 100 mg IV given. Precedex was started due to agitation.  Awake, oriented x3.  Objective   Blood pressure 105/76, pulse 77, temperature 98.4 F (36.9 C), temperature source Axillary, resp. rate (!) 22, height _0  (1.854 m), weight (!) 157.7 kg, SpO2 94 %.        Intake/Output Summary (Last 24 hours) at 01/19/2022 0805 Last data filed at 01/19/2022 0600 Gross per 24 hour  Intake 230.88 ml  Output 2725 ml  Net -2494.12 ml    Filed Weights   01/17/22 0500 01/18/22 0500 01/19/22 0500  Weight: (!) 164.2 kg  (!) 160.3 kg (!) 157.7 kg   Constitutional: morbidly obese, chronically ill appearing Cardiovascular: irregularly  irregular rhythm, tachycardic, no m/r/g Pulmonary/Chest: normal work of breathing on room air, lungs clear to auscultation bilaterally, no wheezing or rhonchi Abdominal: soft, non-tender, non-distended MSK: normal bulk and tone Neurological: alert, oriented x 3, hard of hearing Skin: trace edema to lower extremities bilaterally, warm to touch  Na 140 K 3.9 BUN 42 Hgb 12.8 WBC 8.6  CXR partial clearing of pulmonary vascular congestion and pulmonary edema.  Resolved Hospital Problem list   Acute combined metabolic and respiratory acidosis CAP Acute metabolic encephalopathy  Assessment & Plan:   Flash pulmonary edema Atrial fibrillation with RVR Afib with HR have been elevated into 170-180s overnight. CXR consistent with volume overload, IV lasix 100 mg given. - eliquis 5 mg BID - IV lasix 40 mg - Metoprolol tartrate 150 mg BID  HFrEF Repeat echo 8/9 showing EF 40-45%. Flash pulmonary edema overnight and IV lasix given. - titrating metoprolol - IV lasix 40 mg - keep K> , Mag >2 - restart GDMT as able. Home medications include dapagliflozin, lisinopril, torsemide.  Generalized tonic clonic seizure Intubated 8/9 for airway protection, extubated 8/12.  Neurology following peripherally, unclear if true seizure and if so would be provoked in setting of elevated BUN. MRI negative EEG negative. -holding pregabalin -depakote dc, if another seizure-like activity then would resume depakote and discharge him on that. -neurology following peripherally  Acute renal failure  ATN Uremia Baseline Cr 1.0. He was placed on CRRT 8/10 2/2 to possible seizure from lyrica. CRRT discontinued 8/11. Urine output remiains good at 2700 cc out yesterday. He is net negative 5L. Nephrology has signed off. - strict I+Os, daily weights - daily BMP - avoid nephrotoxic  medications  Urinary retention -add flomax -foley cath in place  DM2 Glucose has been stable. He takes home glipizide which may have prolonged effect in the setting of his renal failure - Q4 CBG - SSI  Stable for transfer to [place] (right click and "Reselect all SmartList Selections" daily)   Diet/type: Regular consistency (see orders) DVT prophylaxis: DOAC  GI prophylaxis: PPI Lines: N/A Foley:  present Code Status:  full code Last date of multidisciplinary goals of care discussion [8/14]  Lamari Beckles M. Bowyn Mercier, D.O.  Internal Medicine Resident, PGY-2 [name]  Pager: [phone] 8:05 AM, 01/19/2022

## 2022-01-20 ENCOUNTER — Encounter (HOSPITAL_COMMUNITY): Payer: Self-pay | Admitting: Pulmonary Disease

## 2022-01-20 ENCOUNTER — Inpatient Hospital Stay (HOSPITAL_COMMUNITY): Payer: Medicare Other

## 2022-01-20 DIAGNOSIS — I4891 Unspecified atrial fibrillation: Secondary | ICD-10-CM | POA: Diagnosis not present

## 2022-01-20 DIAGNOSIS — I959 Hypotension, unspecified: Secondary | ICD-10-CM | POA: Diagnosis not present

## 2022-01-20 DIAGNOSIS — I5023 Acute on chronic systolic (congestive) heart failure: Secondary | ICD-10-CM | POA: Diagnosis not present

## 2022-01-20 DIAGNOSIS — G9340 Encephalopathy, unspecified: Secondary | ICD-10-CM | POA: Diagnosis not present

## 2022-01-20 DIAGNOSIS — N179 Acute kidney failure, unspecified: Secondary | ICD-10-CM | POA: Diagnosis not present

## 2022-01-20 DIAGNOSIS — J9602 Acute respiratory failure with hypercapnia: Secondary | ICD-10-CM | POA: Diagnosis not present

## 2022-01-20 LAB — GLUCOSE, CAPILLARY
Glucose-Capillary: 180 mg/dL — ABNORMAL HIGH (ref 70–99)
Glucose-Capillary: 181 mg/dL — ABNORMAL HIGH (ref 70–99)
Glucose-Capillary: 193 mg/dL — ABNORMAL HIGH (ref 70–99)
Glucose-Capillary: 206 mg/dL — ABNORMAL HIGH (ref 70–99)
Glucose-Capillary: 220 mg/dL — ABNORMAL HIGH (ref 70–99)
Glucose-Capillary: 225 mg/dL — ABNORMAL HIGH (ref 70–99)

## 2022-01-20 LAB — CBC
HCT: 41.1 % (ref 39.0–52.0)
Hemoglobin: 13.3 g/dL (ref 13.0–17.0)
MCH: 28.4 pg (ref 26.0–34.0)
MCHC: 32.4 g/dL (ref 30.0–36.0)
MCV: 87.8 fL (ref 80.0–100.0)
Platelets: 176 10*3/uL (ref 150–400)
RBC: 4.68 MIL/uL (ref 4.22–5.81)
RDW: 16 % — ABNORMAL HIGH (ref 11.5–15.5)
WBC: 11.7 10*3/uL — ABNORMAL HIGH (ref 4.0–10.5)
nRBC: 0.3 % — ABNORMAL HIGH (ref 0.0–0.2)

## 2022-01-20 LAB — BASIC METABOLIC PANEL
Anion gap: 13 (ref 5–15)
BUN: 47 mg/dL — ABNORMAL HIGH (ref 8–23)
CO2: 23 mmol/L (ref 22–32)
Calcium: 9 mg/dL (ref 8.9–10.3)
Chloride: 104 mmol/L (ref 98–111)
Creatinine, Ser: 2.08 mg/dL — ABNORMAL HIGH (ref 0.61–1.24)
GFR, Estimated: 35 mL/min — ABNORMAL LOW (ref >60)
Glucose, Bld: 211 mg/dL — ABNORMAL HIGH (ref 70–99)
Potassium: 4.2 mmol/L (ref 3.5–5.1)
Sodium: 140 mmol/L (ref 135–145)

## 2022-01-20 LAB — MAGNESIUM: Magnesium: 2.2 mg/dL (ref 1.7–2.4)

## 2022-01-20 LAB — BRAIN NATRIURETIC PEPTIDE: B Natriuretic Peptide: 1077.6 pg/mL — ABNORMAL HIGH (ref 0.0–100.0)

## 2022-01-20 MED ORDER — FUROSEMIDE 10 MG/ML IJ SOLN
40.0000 mg | Freq: Two times a day (BID) | INTRAMUSCULAR | Status: DC
  Administered 2022-01-20 – 2022-01-23 (×6): 40 mg via INTRAVENOUS
  Filled 2022-01-20 (×7): qty 4

## 2022-01-20 MED ORDER — AMIODARONE HCL IN DEXTROSE 360-4.14 MG/200ML-% IV SOLN
60.0000 mg/h | INTRAVENOUS | Status: AC
  Administered 2022-01-20 (×2): 60 mg/h via INTRAVENOUS
  Filled 2022-01-20 (×2): qty 200

## 2022-01-20 MED ORDER — AMIODARONE HCL IN DEXTROSE 360-4.14 MG/200ML-% IV SOLN
30.0000 mg/h | INTRAVENOUS | Status: DC
  Administered 2022-01-20 – 2022-01-21 (×3): 30 mg/h via INTRAVENOUS
  Filled 2022-01-20 (×2): qty 200

## 2022-01-20 MED ORDER — METOPROLOL TARTRATE 5 MG/5ML IV SOLN
10.0000 mg | Freq: Once | INTRAVENOUS | Status: AC
  Administered 2022-01-20: 10 mg via INTRAVENOUS
  Filled 2022-01-20: qty 10

## 2022-01-20 MED ORDER — FUROSEMIDE 10 MG/ML IJ SOLN
40.0000 mg | Freq: Once | INTRAMUSCULAR | Status: AC
Start: 2022-01-20 — End: 2022-01-20
  Administered 2022-01-20: 40 mg via INTRAVENOUS
  Filled 2022-01-20: qty 4

## 2022-01-20 MED ORDER — ADULT MULTIVITAMIN W/MINERALS CH
1.0000 | ORAL_TABLET | Freq: Every day | ORAL | Status: DC
  Administered 2022-01-20 – 2022-01-26 (×7): 1 via ORAL
  Filled 2022-01-20 (×7): qty 1

## 2022-01-20 MED ORDER — BANATROL TF EN LIQD
60.0000 mL | Freq: Two times a day (BID) | ENTERAL | Status: DC
  Administered 2022-01-21 – 2022-01-22 (×2): 60 mL via ORAL
  Filled 2022-01-20 (×13): qty 60

## 2022-01-20 MED ORDER — METOPROLOL TARTRATE 5 MG/5ML IV SOLN: 10.0000 mg | Freq: Once | INTRAVENOUS | Status: DC

## 2022-01-20 MED ORDER — AMIODARONE LOAD VIA INFUSION
150.0000 mg | Freq: Once | INTRAVENOUS | Status: AC
  Administered 2022-01-20: 150 mg via INTRAVENOUS
  Filled 2022-01-20: qty 83.34

## 2022-01-20 MED ORDER — INSULIN ASPART 100 UNIT/ML IJ SOLN
3.0000 [IU] | Freq: Three times a day (TID) | INTRAMUSCULAR | Status: DC
  Administered 2022-01-20 – 2022-01-26 (×12): 3 [IU] via SUBCUTANEOUS

## 2022-01-20 MED ORDER — TRAZODONE HCL 50 MG PO TABS
100.0000 mg | ORAL_TABLET | Freq: Every day | ORAL | Status: DC
  Administered 2022-01-21 – 2022-01-25 (×6): 100 mg via ORAL
  Filled 2022-01-20 (×6): qty 2

## 2022-01-20 MED ORDER — DILTIAZEM HCL-DEXTROSE 125-5 MG/125ML-% IV SOLN (PREMIX)
5.0000 mg/h | INTRAVENOUS | Status: DC
  Administered 2022-01-20: 5 mg/h via INTRAVENOUS
  Filled 2022-01-20: qty 125

## 2022-01-20 MED ORDER — METOPROLOL TARTRATE 100 MG PO TABS
200.0000 mg | ORAL_TABLET | Freq: Two times a day (BID) | ORAL | Status: DC
  Administered 2022-01-20 – 2022-01-26 (×12): 200 mg via ORAL
  Filled 2022-01-20 (×13): qty 2

## 2022-01-20 MED ORDER — METOPROLOL TARTRATE 5 MG/5ML IV SOLN
5.0000 mg | Freq: Once | INTRAVENOUS | Status: AC
Start: 2022-01-20 — End: 2022-01-20
  Administered 2022-01-20: 5 mg via INTRAVENOUS
  Filled 2022-01-20: qty 5

## 2022-01-20 MED ORDER — TRAZODONE HCL 50 MG PO TABS
50.0000 mg | ORAL_TABLET | Freq: Every evening | ORAL | Status: DC | PRN
Start: 2022-01-20 — End: 2022-01-26
  Administered 2022-01-23 – 2022-01-24 (×2): 50 mg via ORAL
  Filled 2022-01-20 (×2): qty 1

## 2022-01-20 MED ORDER — OLANZAPINE 5 MG PO TABS
5.0000 mg | ORAL_TABLET | Freq: Every day | ORAL | Status: DC
  Administered 2022-01-20 – 2022-01-21 (×2): 5 mg via ORAL
  Filled 2022-01-20 (×2): qty 1

## 2022-01-20 NOTE — Inpatient Diabetes Management (Signed)
Inpatient Diabetes Program Recommendations  AACE/ADA: New Consensus Statement on Inpatient Glycemic Control (2015)  Target Ranges:  Prepandial:   less than 140 mg/dL      Peak postprandial:   less than 180 mg/dL (1-2 hours)      Critically ill patients:  140 - 180 mg/dL   Lab Results  Component Value Date   GLUCAP 220 (H) 01/20/2022   HGBA1C 9.9 (H) 01/14/2022    Review of Glycemic Control  Latest Reference Range & Units 01/19/22 08:04 01/19/22 11:49 01/19/22 16:06 01/19/22 21:14 01/20/22 01:04 01/20/22 08:16 01/20/22 11:43  Glucose-Capillary 70 - 99 mg/dL 191 (H) 478 (H) 295 (H) 192 (H) 181 (H) 225 (H) 220 (H)    Diabetes history: DM 2 Outpatient Diabetes medications: Glipizide 10 mg Daily, Metformin 1000 mg bid, Actos 45 mg Daily, Farxiga 10 mg Daily Current orders for Inpatient glycemic control:  Semglee 10 units qhs Novolog 0-20 units Q4 hours Novolog 3 units tid meal coverage  A1c 9.9% on 8/9  Spoke with pt at bedside regarding A1c of 9.9% and glucose control at home Pt reports his mom cooks at home. Pt reports taking his medications consistently at home. He fills a pill box every Monday. Pt reports checking his glucose everyday. Pt says he sees his Doctor regularly. Counseled pt on glucose control. Encouraged follow up with PCP for adjustments.  Thanks,  Christena Deem RN, MSN, BC-ADM Inpatient Diabetes Coordinator Team Pager [phone] (8a-5p)

## 2022-01-20 NOTE — Care Management Important Message (Signed)
Important Message  Patient Details  Name: Taylor [name] MRN: [id number] Date of Birth: 1955/07/07   Medicare Important Message Given:  Yes     Dorena Bodo 01/20/2022, 2:45 PM

## 2022-01-20 NOTE — Progress Notes (Addendum)
TRH night cross cover note:   I was notified by RN of the patient's HR, sustained in 140's in setting of permanent atrial fibrillation.  Manner of vital signs were stable, including normotensive blood pressure; respiratory rates in the mid to high 20s, consistent with prior; O2 sats in the mid 90s on room air.  Conveys that he feels little warm in his room this evening, but otherwise denies any acute or worsening symptoms, including no chest pain or worsening shortness of breath.   Per chart review, patient has history of permanent atrial fibrillation, and has been experiencing recurrent RVR during this hospitalization.  Undergoing up titration of oral metoprolol tartrate.  He is chronically anticoagulated on Eliquis.  There is also a suspected contribution to his recurrent overnight RVR in the setting of underlying obstructive sleep apnea for which the patient has been refusing BiPAP.   Patient to received prn dose of Lopressor at this time. I've also requested that his morning labs, which include bmp, mag, cbc, be drawn early.  Also checking BMP and order chest x-ray to evaluate for volume overload and given some additional IV diuresis.  Additionally, placed order to change patient's status/location of care from med-tele to pcu.   BMP this AM notable for potassium level of 4.2 as well as ongoing improvement in renal function.  Serum magnesium level noted to be 2.2.   Dilt drip initiated.    Newton Pigg, DO Hospitalist

## 2022-01-20 NOTE — Progress Notes (Signed)
RT set up cpap and placed pt on machine, pt immediatly took it off and said he couldn't wear it. RT placed pt back on [place] at this time.

## 2022-01-20 NOTE — Progress Notes (Signed)
Nutrition Follow-up  DOCUMENTATION CODES:   Not applicable  INTERVENTION:   Multivitamin w/ minerals daily 60 mL Banatrol - BID  NUTRITION DIAGNOSIS:   Inadequate oral intake related to inability to eat as evidenced by NPO status. - Progressing, now on diet  GOAL:   Patient will meet greater than or equal to 90% of their needs - Progressing  MONITOR:   TF tolerance, Vent status, Labs  REASON FOR ASSESSMENT:   Ventilator, Consult Enteral/tube feeding initiation and management  ASSESSMENT:   66 yo male admitted with shock, AKI. PMH includes HTN, DM-2, CAD, NICM, A fib, OSA, chronic back pain.  8/09 - CRRT started 8/11 - CRRT stopped 8/12 - Extubated; diet advanced to Dysphagia 3 8/14 - transferred to floor  Pt laying in bed, lunch tray at bedside (untouched). Pt reports that he does not have an appetite. States that the food is not like his moms cooking. Pt declined supplements when RD offered due to poor appetite.   Discussed case with RN. RN reports that he ate a little for breakfast. Complaints of stomach issues, multiple episodes of diarrhea today. RD to trial Banatrol to aide in diarrhea.   Medications reviewed and include: Colace, Lasix, NovoLog, Semglee, Miralax, Senokot,  Labs reviewed: BUN 47, Creatinine 2.08, 24 hr CBG 181-225  Diet Order:   Diet Order             DIET DYS 3 Room service appropriate? Yes; Fluid consistency: Thin  Diet effective now                   EDUCATION NEEDS:   Not appropriate for education at this time  Skin:  Skin Assessment: Reviewed RN Assessment  Last BM:  PTA  Height:   Ht Readings from Last 1 Encounters:  01/13/22 6\' 1"  (1.854 m)    Weight:   Wt Readings from Last 1 Encounters:  01/19/22 (!) 157.7 kg    Ideal Body Weight:  83.6 kg  BMI:  Body mass index is 45.87 kg/m.  Estimated Nutritional Needs:   Kcal:  2400-2600  Protein:  165-175 gm  Fluid:  2.4-2.6 L    01/21/22 RD,  LDN Clinical Dietitian See [facility] for contact information.

## 2022-01-20 NOTE — Consult Note (Addendum)
Cardiology Consultation:   Patient ID: Taylor [name] MRN: [id number]; DOB: [birth year]  Admit date: 01/13/2022 Date of Consult: 01/20/2022  PCP:  Birdie Sons, MD   [facility] Providers Cardiologist:  Kate Sable, MD  Electrophysiologist:  Vickie Epley, MD       Patient Profile:   Taylor [name] is a 66 y.o. male with a hx of HTN CAD (60% mLAD, 30% D2), diabetes, permanent atrial fibrillation s/p failed DCCV, NICM last EF 35-40%, OSA, morbid obesity who is being seen 01/20/2022 for the evaluation of atrial fib at the request of Dr. Bonner Puna.  History of Present Illness:   Taylor [name]  with a hx of hypertension, CAD (60% mLAD, 30% D2), diabetes, permanent atrial fibrillation s/p failed DCCV, NICM last EF 35-40%, OSA, morbid obesity.  He is not a candidate for ablation due to morbid obesity.  Treated with toprol XL and lisinopril    Hx of cardiac cath 04/05/20 with mid LAD 60% stenosis and 2nd diag 30% stenosis. Plan medical therapy.   Echo TEE 02/2021 with EF 35-40%, global hypokinesis RV systolic function mildy reduced.  Mild MR, TR mod   Presented to [facility] 01/13/22 with altered Mental status. He is on pain meds and sedative meds including Ambien, oxycodone, Dilaudid and Lyrica - family noted + nausea and falling asleep - had BLE edema and was hypotensive.  Transferred to [facility].  + AKI Cr from 9.9 had been 1.04-eventual CRRT and no improved nephrology signed off., CHF, placed on levophed.   He did develop seizure like activity Neurology saw.  Felt due to Lyrica resolved, MRI and EEG neg.  For acute hypoxemia and resp. Failure and underlying OSA had been on vent and was extubated 01/17/22.   He remained in a fib rate was elevated.    Hs troponin 20 then 15  Pt now improved and out of ICU. Now with acute on chronic HFrEF with pulmonary edema may be related to rapid a fib.    Echo this admit technically difficult EF 40-45% RV grossly normal. Normal PA systolic  pressure. Trivial MR, No AS.  No LVH.     EKG:  The EKG was personally reviewed and demonstrates:  on admit with artifact and atrial fib no acute ST changes noted Telemetry:  Telemetry was personally reviewed and demonstrates:  today HR to 150  Na 140, K+ 4.2 Cr 2.08 Mg+ 2.2  BNP today 1,077 WBC 11.7 Hgb 13.3 plts 176  CXR today IMPRESSION: Cardiomegaly with vascular congestion and interstitial pulmonary edema, similar to prior.  Pk wt 166.8 now 157.7 and neg 6,437 since admit.  Lasix 40 mg last night and today  With increase of HR IV amio added to control rate. He has had several doses of lopressor 5 mg He is back on eliquis     Currently HR is 108 t o120 but this AM was up 150s. No chest pain.  He does not acknowledge SOB but he is.   Past Medical History:  Diagnosis Date   Arthritis    Broken leg 02/07/2019   CAD (coronary artery disease)    Depression    Diabetes mellitus without complication ([place])    Hypertension    NICM (nonischemic cardiomyopathy) ([place])     Past Surgical History:  Procedure Laterality Date   BACK SURGERY     CARDIOVERSION N/A 08/14/2020   Procedure: CARDIOVERSION;  Surgeon: Kate Sable, MD;  Location: [facility] ORS;  Service: Cardiovascular;  Laterality: N/A;  CARDIOVERSION N/A 03/03/2021   Procedure: CARDIOVERSION (CATH LAB);  Surgeon: Vickie Epley, MD;  Location: [place] [facility];  Service: Cardiovascular;  Laterality: N/A;   LEFT HEART CATH AND CORONARY ANGIOGRAPHY Left 04/05/2020   Procedure: LEFT HEART CATH AND CORONARY ANGIOGRAPHY;  Surgeon: Wellington Hampshire, MD;  Location: [place] CV LAB;  Service: Cardiovascular;  Laterality: Left;   TEE WITHOUT CARDIOVERSION N/A 03/03/2021   Procedure: TRANSESOPHAGEAL ECHOCARDIOGRAM (TEE);  Surgeon: Vickie Epley, MD;  Location: [place] [facility];  Service: Cardiovascular;  Laterality: N/A;     Home Medications:  Prior to Admission medications   Medication Sig Start Date End Date  Taking? Authorizing Provider  apixaban (ELIQUIS) 5 MG TABS tablet TAKE 1 TABLET BY MOUTH TWICE DAILY 12/23/21  Yes Agbor-Etang, Aaron Edelman, MD  Aspirin-Salicylamide-Caffeine (BC HEADACHE POWDER PO) Take 2 packets by mouth daily as needed (hangover, headache).   Yes [provider]  dapagliflozin propanediol (FARXIGA) 10 MG TABS tablet Take 1 tablet (10 mg total) by mouth daily before breakfast. 09/22/21  Yes Fisher, Kirstie Peri, MD  glipiZIDE (GLUCOTROL XL) 10 MG 24 hr tablet TAKE 1 TABLET BY MOUTH ONCE DAILY. Patient taking differently: Take 10 mg by mouth daily. 04/14/21  Yes Birdie Sons, MD  HYDROmorphone (DILAUDID) 4 MG tablet Take 4 mg by mouth See admin instructions. Brand name Dilaudid 4 mg - 4 mg 3-4 times daily (RX written for 4 times daily)   Yes [provider]  lisinopril (ZESTRIL) 10 MG tablet TAKE 1 TABLET BY MOUTH ONCE DAILY. Patient taking differently: Take 10 mg by mouth daily. 09/16/21  Yes Birdie Sons, MD  metFORMIN (GLUCOPHAGE) 1000 MG tablet TAKE 1 TABLET BY MOUTH TWICE DAILY Patient taking differently: Take 1,000 mg by mouth 2 (two) times daily. 06/10/21  Yes Birdie Sons, MD  metoprolol (TOPROL XL) 200 MG 24 hr tablet Take 1 tablet (200 mg total) by mouth daily. 07/17/21  Yes Agbor-Etang, Aaron Edelman, MD  NARCAN 4 MG/0.1ML LIQD nasal spray kit Place 0.4 mg into the nose as needed (accidental overdose). 07/04/18  Yes [provider]  OXYCONTIN 20 MG 12 hr tablet Take 20 mg by mouth See admin instructions. 20 mg 3-4 times daily. 03/30/20  Yes [provider]  pioglitazone (ACTOS) 45 MG tablet TAKE 1 TABLET BY MOUTH ONCE DAILY. 06/25/21  Yes Birdie Sons, MD  pregabalin (LYRICA) 100 MG capsule Take 100 mg by mouth 3 (three) times daily.   Yes [provider]  simvastatin (ZOCOR) 40 MG tablet TAKE 1 TABLET BY MOUTH ONCE DAILY. Patient taking differently: Take 40 mg by mouth daily. 09/11/21  Yes Birdie Sons, MD  torsemide (DEMADEX) 20 MG  tablet Take 40 MG (2 tabs) every AM, and Take 20 MG (1 tab) every PM. Patient taking differently: Take 20 mg by mouth 2 (two) times daily at 10 AM and 5 PM. 12/10/21  Yes Agbor-Etang, Aaron Edelman, MD  zolpidem (AMBIEN CR) 12.5 MG CR tablet Take 12.5 mg by mouth at bedtime.  03/03/07  Yes [provider]  potassium chloride SA (KLOR-CON) 20 MEQ tablet Take 1 tablet (20 mEq total) by mouth daily. Patient not taking: Reported on 01/20/2022 04/24/21   Kate Sable, MD    Inpatient Medications: Scheduled Meds:  apixaban  5 mg Oral BID   Chlorhexidine Gluconate Cloth  6 each Topical Q0600   docusate sodium  100 mg Oral BID   fiber supplement (BANATROL TF)  60 mL Oral  BID   furosemide  40 mg Intravenous BID   insulin aspart  0-20 Units Subcutaneous Q4H   insulin aspart  3 Units Subcutaneous TID WC   insulin glargine-yfgn  10 Units Subcutaneous QHS   lidocaine  1 patch Transdermal Q24H   metoprolol tartrate  200 mg Oral BID   multivitamin with minerals  1 tablet Oral Daily   oxyCODONE  10 mg Oral Q6H   polyethylene glycol  17 g Oral Daily   senna  1 tablet Oral Daily   tamsulosin  0.4 mg Oral Daily   traZODone  100 mg Oral QHS   zolpidem  5 mg Oral QHS   Continuous Infusions:  sodium chloride 250 mL (01/19/22 1033)   amiodarone 60 mg/hr (01/20/22 1533)   Followed by   amiodarone     PRN Meds: HYDROmorphone, ipratropium-albuterol, lip balm, ondansetron (ZOFRAN) IV, mouth rinse  Allergies:    Allergies  Allergen Reactions   Other Other (See Comments)    Pain medication/ Must take brand named CAN NOT take generic    Social History:   Social History   Socioeconomic History   Marital status: Single    Spouse name: Not on file   Number of children: 1   Years of education: Not on file   Highest education level: Some college, no degree  Occupational History   Occupation: disability/retired  Tobacco Use   Smoking status: Never   Smokeless tobacco: Never  Vaping Use    Vaping Use: Never used  Substance and Sexual Activity   Alcohol use: No    Alcohol/week: 0.0 standard drinks of alcohol   Drug use: No   Sexual activity: Not on file  Other Topics Concern   Not on file  Social History Narrative   Lives with Mother [name].    Social Determinants of Health   Financial Resource Strain: Not on file  Food Insecurity: Not on file  Transportation Needs: Not on file  Physical Activity: Not on file  Stress: Not on file  Social Connections: Not on file  Intimate Partner Violence: Not on file    Family History:    Family History  Problem Relation Age of Onset   Diabetes Mother    Diabetes Sister      ROS:  Please see the history of present illness.  General:no colds or fevers, no weight changes Skin:no rashes or ulcers HEENT:no blurred vision, no congestion CV:see HPI PUL:see HPI GI:no diarrhea constipation or melena, no indigestion GU:no hematuria, no dysuria MS:no joint pain, no claudication Neuro:no syncope, no lightheadedness Endo:+ diabetes, no thyroid disease  All other ROS reviewed and negative.     Physical Exam/Data:   Vitals:   01/20/22 1230 01/20/22 1514 01/20/22 1525 01/20/22 1530  BP: (!) 119/92 (!) 131/96 (!) 146/101 (!) 132/109  Pulse: (!) 106 (!) 122 (!) 113 (!) 133  Resp: (!) 22 (!) 27 (!) 21 (!) 25  Temp: (!) 97.5 F (36.4 C) 97.8 F (36.6 C)    TempSrc: Oral Oral    SpO2: 97% 92% 90% 90%  Weight:      Height:        Intake/Output Summary (Last 24 hours) at 01/20/2022 1638 Last data filed at 01/20/2022 1544 Gross per 24 hour  Intake --  Output 1350 ml  Net -1350 ml      01/19/2022    5:00 AM 01/18/2022    5:00 AM 01/17/2022    5:00 AM  Last 3 Weights  Weight (  lbs) 347 lb 10.7 oz 353 lb 6.4 oz 361 lb 15.9 oz  Weight (kg) 157.7 kg 160.3 kg 164.2 kg     Body mass index is 45.87 kg/m.  General:  Well nourished, well developed, in no acute distress  HEENT: normal Neck: mild JVD Vascular: No carotid bruits;  Distal pulses 2+ bilaterally Cardiac:  irreg irreg ; no murmur gallup rub or click Lungs:  some wheezes and diminished to auscultation bilaterally,  rhonchi or rales  Abd: soft, nontender, no hepatomegaly  Ext: + edema lower ext, mostly feet Musculoskeletal:  No deformities, BUE and BLE strength normal and equal Skin: cool and moist Neuro:  CNs 2-12 intact, no focal abnormalities noted Psych:  Normal affect    Relevant CV Studies: Echo 01/14/22 IMPRESSIONS     1. Technically difficult study, very limited views even with contrast  administration   2. Left ventricular ejection fraction, by estimation, is grossly 40 to  45%. The left ventricle has mildly decreased function. Left ventricular  endocardial border not optimally defined to evaluate regional wall motion.  Left ventricular diastolic  parameters are indeterminate.   3. Right ventricule is not well visualized but grossly normal size and  systolic function. There is normal pulmonary artery systolic pressure.   4. The mitral valve is grossly normal. Trivial mitral valve  regurgitation.   5. The aortic valve was not well visualized. Aortic valve regurgitation  is not visualized. No aortic stenosis is present.   6. The inferior vena cava is dilated in size with <50% respiratory  variability, suggesting right atrial pressure of 15 mmHg.   FINDINGS   Left Ventricle: Left ventricular ejection fraction, by estimation, is 40  to 45%. The left ventricle has mildly decreased function. Left ventricular  endocardial border not optimally defined to evaluate regional wall motion.  Definity contrast agent was  given IV to delineate the left ventricular endocardial borders. The left  ventricular internal cavity size was normal in size. There is no left  ventricular hypertrophy. Left ventricular diastolic parameters are  indeterminate.   Right Ventricle: The right ventricular size is not well visualized. Right  vetricular wall thickness  was not well visualized. Right ventricular  systolic function was not well visualized. There is normal pulmonary  artery systolic pressure. The tricuspid  regurgitant velocity is 2.14 m/s, and with an assumed right atrial  pressure of 15 mmHg, the estimated right ventricular systolic pressure is  78.2 mmHg.   Left Atrium: Left atrial size was not well visualized.   Right Atrium: Right atrial size was not well visualized.   Pericardium: Trivial pericardial effusion is present.   Mitral Valve: The mitral valve is grossly normal. Trivial mitral valve  regurgitation.   Tricuspid Valve: The tricuspid valve is normal in structure. Tricuspid  valve regurgitation is trivial.   Aortic Valve: The aortic valve was not well visualized. Aortic valve  regurgitation is not visualized. No aortic stenosis is present.   Pulmonic Valve: The pulmonic valve was not well visualized. Pulmonic valve  regurgitation is not visualized.   Aorta: The aortic root is normal in size and structure.   Venous: The inferior vena cava is dilated in size with less than 50%  respiratory variability, suggesting right atrial pressure of 15 mmHg.   IAS/Shunts: The interatrial septum was not well visualized.      LEFT VENTRICLE  PLAX 2D  LVIDd:         5.40 cm   Diastology  LVIDs:  4.00 cm   LV e' lateral: 13.10 cm/s  LV PW:         0.80 cm  LV IVS:        0.90 cm  LVOT diam:     2.00 cm  LV SV:         30  LV SV Index:   11  LVOT Area:     3.14 cm      LEFT ATRIUM         Index  LA diam:    4.30 cm 1.55 cm/m   AORTIC VALVE  LVOT Vmax:   58.80 cm/s  LVOT Vmean:  38.200 cm/s  LVOT VTI:    0.096 m     AORTA  Ao Root diam: 3.00 cm   TRICUSPID VALVE  TR Peak grad:   18.3 mmHg  TR Vmax:        214.00 cm/s     SHUNTS  Systemic VTI:  0.10 m  Systemic Diam: 2.00 cm   TEE 03/03/21 IMPRESSIONS     1. Left ventricular ejection fraction, by estimation, is 35 to 40%. The  left ventricle has  moderately decreased function. The left ventricle  demonstrates global hypokinesis. The left ventricular internal cavity size  was mildly dilated.   2. Right ventricular systolic function is mildly reduced. The right  ventricular size is normal.   3. No left atrial/left atrial appendage thrombus was detected. The LAA  emptying velocity was 24 cm/s.   4. The mitral valve is normal in structure. Mild mitral valve  regurgitation. No evidence of mitral stenosis.   5. Tricuspid valve regurgitation is moderate.   6. The aortic valve is tricuspid. Aortic valve regurgitation is not  visualized. No aortic stenosis is present.   Conclusion(s)/Recommendation(s): No evidence of thrombus. Proceeded to  cardioversion by Dr. Quentin Ore.   FINDINGS   Left Ventricle: Left ventricular ejection fraction, by estimation, is 35  to 40%. The left ventricle has moderately decreased function. The left  ventricle demonstrates global hypokinesis. The left ventricular internal  cavity size was mildly dilated.   Right Ventricle: The right ventricular size is normal. No increase in  right ventricular wall thickness. Right ventricular systolic function is  mildly reduced.   Left Atrium: Left atrial size was not assessed. No left atrial/left atrial  appendage thrombus was detected. The LAA emptying velocity was 24 cm/s.   Right Atrium: Right atrial size was not assessed.   Pericardium: There is no evidence of pericardial effusion.   Mitral Valve: The mitral valve is normal in structure. Mild mitral valve  regurgitation. No evidence of mitral valve stenosis.   Tricuspid Valve: The tricuspid valve is normal in structure. Tricuspid  valve regurgitation is moderate . No evidence of tricuspid stenosis.   Aortic Valve: The aortic valve is tricuspid. Aortic valve regurgitation is  not visualized. No aortic stenosis is present.   Pulmonic Valve: The pulmonic valve was grossly normal. Pulmonic valve  regurgitation is  trivial. No evidence of pulmonic stenosis.   Aorta: The aortic root is normal in size and structure.   IAS/Shunts: No atrial level shunt detected by color flow Doppler.      TRICUSPID VALVE  TR Peak grad:   41.2 mmHg  TR Vmax:        321.00 cm/s   Cardiac cath 2021 Mid LAD lesion is 60% stenosed. 2nd Diag lesion is 30% stenosed.   1.  Borderline 60% stenosis in the mid left anterior descending  artery.  Mild diffuse disease affecting the left circumflex and right coronary artery. 2.  Left ventricular angiography was not performed.  EF was moderately reduced by echo. 3.  Moderately elevated left ventricular end-diastolic pressure at 22 mmHg.   Recommendations: Recommend aggressive medical therapy. Resume Eliquis tomorrow. Consider cardioversion in 3 to 4 weeks given persistent tachycardia. If the patient has persistent angina symptoms after that in spite of medical therapy, FFR guided revascularization of the LAD can be considered.    Laboratory Data:  High Sensitivity Troponin:   Recent Labs  Lab 01/13/22 2331 01/14/22 0109  TROPONINIHS 20* 15     Chemistry Recent Labs  Lab 01/17/22 0520 01/18/22 0504 01/18/22 2334 01/19/22 0712 01/20/22 0508  NA 138   < > 138 140 140  K 3.4*   < > 4.4 3.9 4.2  CL 106   < > 105 104 104  CO2 22   < > 20* 24 23  GLUCOSE 161*   < > 221* 183* 211*  BUN 35*   < > 41* 42* 47*  CREATININE 3.44*   < > 2.38* 2.31* 2.08*  CALCIUM 8.1*   < > 9.2 8.9 9.0  MG 2.4  --  2.3  --  2.2  GFRNONAA 19*   < > 30* 31* 35*  ANIONGAP 10   < > _0 < > = values in this interval not displayed.    Recent Labs  Lab 01/13/22 2331 01/14/22 0631 01/15/22 0327 01/16/22 0358 01/16/22 1431 01/18/22 0504  PROT 7.8 6.9  --   --   --   --   ALBUMIN 3.5 3.1*   < > 2.5* 2.4* 2.6*  AST 21 18  --   --   --   --   ALT 18 15  --   --   --   --   ALKPHOS 83 71  --   --   --   --   BILITOT 0.7 0.6  --   --   --   --    < > = values in this interval not  displayed.   Lipids  Recent Labs  Lab 01/16/22 0358  TRIG 117    Hematology Recent Labs  Lab 01/18/22 0504 01/19/22 0712 01/20/22 0508  WBC 9.3 8.6 11.7*  RBC 4.41 4.52 4.68  HGB 12.5* 12.8* 13.3  HCT 37.5* 39.3 41.1  MCV 85.0 86.9 87.8  MCH 28.3 28.3 28.4  MCHC 33.3 32.6 32.4  RDW 15.7* 15.9* 16.0*  PLT 164 143* 176   Thyroid  Recent Labs  Lab 01/14/22 0631  TSH 3.762    BNP Recent Labs  Lab 01/13/22 2331 01/20/22 0508  BNP 119.0* 1,077.6*    DDimer No results for input(s): "DDIMER" in the last 168 hours.   Radiology/Studies:  DG Chest Port 1 View  Result Date: 01/20/2022 CLINICAL DATA:  Shortness of breath.  Atrial fibrillation with RVR EXAM: PORTABLE CHEST 1 VIEW COMPARISON:  01/19/2022 FINDINGS: The cardio pericardial silhouette is enlarged. Vascular congestion with interstitial edema noted. No substantial pleural effusion. The visualized bony structures of the thorax are unremarkable. Telemetry leads overlie the chest. IMPRESSION: Cardiomegaly with vascular congestion and interstitial pulmonary edema, similar to prior. Electronically Signed   By: Misty Stanley M.D.   On: 01/20/2022 07:09   DG CHEST PORT 1 VIEW  Result Date: 01/19/2022 CLINICAL DATA:  Shortness of breath EXAM: PORTABLE CHEST 1 VIEW COMPARISON:  Previous studies including the  examination of 01/18/2022 FINDINGS: Transverse diameter of heart is increased. There is partial clearing of interstitial pulmonary edema. No new focal infiltrates are seen. Left lateral CP angle is not included in the image. There is no pneumothorax. IMPRESSION: Partial clearing of pulmonary vascular congestion and pulmonary edema. Electronically Signed   By: Elmer Picker M.D.   On: 01/19/2022 08:10   DG CHEST PORT 1 VIEW  Result Date: 01/18/2022 CLINICAL DATA:  Respiratory distress EXAM: PORTABLE CHEST 1 VIEW COMPARISON:  01/14/2022 FINDINGS: Tubes and lines have been removed in the interval from the prior exam.  Cardiac shadow is enlarged. Diffuse vascular congestion is noted with interstitial edema bilaterally. The lung bases are not included on this image. IMPRESSION: Changes of CHF with interstitial edema. Electronically Signed   By: Inez Catalina M.D.   On: 01/18/2022 23:02     Assessment and Plan:   Permanent Atrial fib with RVR despite IV metoprolol earlier today amiodarone infusing with slower HR now.  Prior to admit was on toprol XL 200 mg. But was off from 01/13/22 until yesterday resumed at 150 BID lopressor now 200 BID lopressor.  Had been off due to hypotension. Leave on IV amio and switch to po tomorrow for rate control for now  SOB with talking continue eliquis Acute on chronic systolic HF, elevated BNP is diuresing with lasix now on lasix 40 BID.  EF actually improved now 40-45% - currently with some wheezes and lower ext edema OSA pt does not want CPAP because they monitor remotely.   AKI with CVVRH now stopped Cr down 2.  Nephrology signed off CAD with neg trop and no chest pain  Acute toxic metabolic encephalopathy -seizure/myoclonic jerking due to lyrica. Per IM DM per IM HLD continue statin   Risk Assessment/Risk Scores:        New York Heart Association (NYHA) Functional Class NYHA Class III  CHA2DS2-VASc Score = 4   This indicates a 4.8% annual risk of stroke. The patient's score is based upon: CHF History: 1 HTN History: 1 Diabetes History: 1 Stroke History: 0 Vascular Disease History: 1 Age Score: 0 Gender Score: 0         For questions or updates, please contact [place] Please consult [email or web address] for contact info under    Signed, Cecilie Kicks, NP  01/20/2022 4:38 PM  Patient seen, examined. Available data reviewed. Agree with findings, assessment, and plan as outlined by Cecilie Kicks, NP.  The patient is independently interviewed and examined.  He is alert, oriented, in no distress.  The patient is morbidly obese.  HEENT: Edentulous, neck: JVP  moderately elevated but difficult to visualize, carotid upstrokes are normal without bruits, heart is distant, tachycardic, and irregularly irregular with no murmur gallop, lungs show diffuse wheezing, abdomen is soft, obese, and nontender, extremities have 2+ lower extremity pretibial and ankle edema, skin is warm and dry without rash.  Telemetry demonstrates atrial fibrillation with RVR heart rate in the 150's earlier today, now down to about 100 to 110 bpm.  The patient's echocardiogram is reviewed and shows mild to moderate global LV systolic dysfunction with LVEF about 45% the patient has nonobstructive coronary artery disease and has known nonischemic cardiomyopathy.  He has been treated with a rhythm control strategy for permanent atrial fibrillation after failing cardioversion and amiodarone.  He is not a candidate for advanced EP care because of his morbid obesity and poor health.  He appears functionally very limited.  I agree with plans to  continue with IV diuresis as he remains volume overloaded.  I think our options for rate control are limited and I agree with what has been done to date with IV amiodarone for rate control and continuation of high-dose metoprolol succinate.  He is not a candidate for calcium channel blocker because of LV dysfunction and heart failure.  He is not a candidate for digoxin because of presentation with acute renal failure and high risk of dig toxicity.  We will plan to transition to oral amiodarone at least for the short-term and probably will need to tolerate a somewhat elevated resting heart rate due to lack of other treatment options.  We will through his hospital course.   Sherren Mocha, M.D. 01/20/2022 5:04 PM

## 2022-01-20 NOTE — Progress Notes (Signed)
Progress Note  Patient: DEVERE BREM YQI:[id number] DOB: [birth year]  DOA: 01/13/2022  DOS: 01/20/2022    Brief hospital course: Taylor [name] is a 66 y.o. male with a history of NICM, chronic HFrEF (LVEF 35-40%), CAD, permanent atrial fibrillation s/p failed DCCV, HTN, HLD, T2DM, morbid obesity, and chronic back pain on oxycodone, dilaudid, lyrica who presented to the ED 8/8 with lethargy, nausea. He was encephalopathic with leg edema, in renal failure with creatinine of 9.94 from baseline of 1.04, hypotensive requiring pressors. He was transferred from [facility] ICU and underwent CRRT 8/9 - 8/11, extubated 8/12. Neurology was involved for concern of generalized tonic-clonic seizure which was treated with depakote. EEG and MRI were negative, so this was stopped. Lyrica has been discontinued as well.   Hospitalization complicated by acute urinary retention, RVR and pulmonary edema, though this is improving and he is felt stable for transfer to medical service to pick up 8/4.   Assessment and Plan: Acute hypoxic respiratory failure: Pt states he's been told he needed oxygen in the past but never pursued it.  - Wean oxygen as tolerated with diuresis as below.  - OOB, IS encouraged.   Acute on chronic HFrEF: With pulmonary edema, likely related to rapid atrial fibrillation. - Continue IV diuresis, monitor I/O, weights. Home torsemide is 40mg  qAM, 20mg  qPM. Diuresing well with lasix 40mg  IV yesterday. Remains volume up, will plan to give lasix 40mg  IV BID, and will discuss with cardiology (sees Dr. ) - Holding SGLT2i, ACEi in setting of renal failure  Permanent atrial fibrillation with RVR: s/p failed DCCV March and Sept 2022.  - Continue metoprolol, increased dose back to 200mg , ordered this AM. Give metoprolol 10mg  IV x1. Stop dilt gtt w/LV systolic dysfunction on echo.  - Cardiology consulted for further recommendations - Continue DOAC - Said not to be RFA candidate due to morbid  obesity.  - Supp K to keep at goal 4. Note TSH 3.762.  Acute toxic and metabolic encephalopathy: Due to polypharmacy in the setting of renal failure and uremia. - Has improved, still a bit altered. continue delirium precautions, optimizing metabolic parameters, holding oxycontin, and reduced ambien to 5mg  and dilaudid dose to 2mg   Acute renal failure due to ATN: Resolved, s/p CRRT 8/9 - 8/11 with subsequent good UOP and Cr declining.  - Monitor UOP, Cr.    Morbid obesity: Body mass index is 45.87 kg/m.  - Pt's PCP has discussed ozempic, will plan to trial (has had rybelsus intolerance (nausea)) after discharge.  Acute urinary retention:  - Continue foley for now given IV diuresis and failed voiding trial.  - Continue new tamsulosin  Chronic normocytic anemia:  - Mild, monitor while on anticoagulation. Hgb 13.3 today  Thrombocytopenia: Unclear etiology, mild and resolved.  HTN: Stable  HLD:  - Holding statin for now  T2DM:  - Started glargine 10u qHS, continue SSI, add mealtime coverage 3u  Subjective: Becoming more aware of his prior confusion, denies shortness of breath or chest pain, worked with therapy yesterday. Does not feel anxious. Pain in penis stable. Does not feel palpitations currently.  Objective: Vitals:   01/20/22 0644 01/20/22 0701 01/20/22 0703 01/20/22 0749  BP: (!) 150/118 (!) 141/97 (!) 126/116 (!) 144/101  Pulse: (!) 146 (!) 150 (!) 159 (!) 149  Resp: (!) 31 (!) 28 (!) 22 20  Temp: 98 F (36.7 C) 98.1 F (36.7 C)  98 F (36.7 C)  TempSrc: Oral Oral  Oral  SpO2:  94% 92% 92% 90%  Weight:      Height:       Gen: 66 y.o. male in no distress Pulm: Nonlabored tachypnea with crackles at bases, no wheezes. CV: Rapid irregular, rate in 140-150's without definite murmur, rub, or gallop. No JVD, + dependent edema. GI: Abdomen soft, protuberant, non-tender, non-distended, with normoactive bowel sounds.  GU: +foley, no hematuria Ext: Warm, no  deformities Skin: No new rashes, lesions or ulcers on visualized skin. Neuro: Alert, HOH, interactive without focal neurological deficits. Psych: Judgement and insight appear fair. Mood euthymic & affect congruent. Behavior is appropriate.    Data Personally reviewed: CBC: Recent Labs  Lab 01/13/22 2331 01/14/22 0631 01/14/22 4010 01/16/22 0358 01/17/22 0519 01/18/22 0504 01/19/22 0712 01/20/22 0508  WBC 8.8 9.6   < > 7.2 5.6 9.3 8.6 11.7*  NEUTROABS 5.8 6.4  --   --   --   --   --   --   HGB 13.0 12.3*   < > 12.0* 11.5* 12.5* 12.8* 13.3  HCT 40.5 37.9*   < > 35.6* 34.0* 37.5* 39.3 41.1  MCV 88.0 88.1   < > 83.8 83.7 85.0 86.9 87.8  PLT 207 205   < > 170 151 164 143* 176   < > = values in this interval not displayed.   Basic Metabolic Panel: Recent Labs  Lab 01/15/22 0327 01/15/22 0930 01/15/22 1542 01/16/22 0358 01/16/22 1431 01/17/22 0520 01/18/22 0504 01/18/22 2334 01/19/22 0712 01/20/22 0508  NA 132*   < > 135 134* 136 138 140 138 140 140  K 3.7   < > 3.3* 3.9 4.1 3.4* 3.9 4.4 3.9 4.2  CL 96*  --  99 102 102 106 104 105 104 104  CO2 20*  --  20* 22 23 22 23  20* 24 23  GLUCOSE 108*  --  144* 146* 178* 161* 174* 221* 183* 211*  BUN 56*  --  48* 33* 29* 35* 37* 41* 42* 47*  CREATININE 7.87*  --  6.71* 4.33* 3.58* 3.44* 2.70* 2.38* 2.31* 2.08*  CALCIUM 7.4*  --  7.2* 7.3* 8.0* 8.1* 8.7* 9.2 8.9 9.0  MG 2.1  --   --  2.3  --  2.4  --  2.3  --  2.2  PHOS 7.3*  --  5.2* 3.6 3.3  --  4.4  --   --   --    < > = values in this interval not displayed.   GFR: Estimated Creatinine Clearance: 56.3 mL/min (A) (by C-G formula based on SCr of 2.08 mg/dL (H)). Liver Function Tests: Recent Labs  Lab 01/13/22 2331 01/14/22 0631 01/15/22 0327 01/15/22 1542 01/16/22 0358 01/16/22 1431 01/18/22 0504  AST 21 18  --   --   --   --   --   ALT 18 15  --   --   --   --   --   ALKPHOS 83 71  --   --   --   --   --   BILITOT 0.7 0.6  --   --   --   --   --   PROT 7.8 6.9  --    --   --   --   --   ALBUMIN 3.5 3.1* 2.7* 2.4* 2.5* 2.4* 2.6*   Recent Labs  Lab 01/13/22 2331  LIPASE 25   No results for input(s): "AMMONIA" in the last 168 hours. Coagulation Profile: Recent Labs  Lab  01/14/22 0631  INR 1.4*   Cardiac Enzymes: No results for input(s): "CKTOTAL", "CKMB", "CKMBINDEX", "TROPONINI" in the last 168 hours. BNP (last 3 results) No results for input(s): "PROBNP" in the last 8760 hours. HbA1C: No results for input(s): "HGBA1C" in the last 72 hours. CBG: Recent Labs  Lab 01/19/22 1149 01/19/22 1606 01/19/22 2114 01/20/22 0104 01/20/22 0816  GLUCAP 217* 185* 192* 181* 225*   Lipid Profile: No results for input(s): "CHOL", "HDL", "LDLCALC", "TRIG", "CHOLHDL", "LDLDIRECT" in the last 72 hours. Thyroid Function Tests: No results for input(s): "TSH", "T4TOTAL", "FREET4", "T3FREE", "THYROIDAB" in the last 72 hours. Anemia Panel: No results for input(s): "VITAMINB12", "FOLATE", "FERRITIN", "TIBC", "IRON", "RETICCTPCT" in the last 72 hours. Urine analysis:    Component Value Date/Time   COLORURINE YELLOW 01/14/2022 0631   APPEARANCEUR HAZY (A) 01/14/2022 0631   LABSPEC 1.016 01/14/2022 0631   PHURINE 5.0 01/14/2022 0631   GLUCOSEU 50 (A) 01/14/2022 0631   HGBUR MODERATE (A) 01/14/2022 0631   BILIRUBINUR NEGATIVE 01/14/2022 0631   KETONESUR NEGATIVE 01/14/2022 0631   PROTEINUR 100 (A) 01/14/2022 0631   NITRITE NEGATIVE 01/14/2022 0631   LEUKOCYTESUR NEGATIVE 01/14/2022 0631   Recent Results (from the past 240 hour(s))  Blood culture (routine x 2)     Status: None   Collection Time: 01/13/22 11:31 PM   Specimen: BLOOD RIGHT FOREARM  Result Value Ref Range Status   Specimen Description BLOOD RIGHT FOREARM  Final   Special Requests   Final    BOTTLES DRAWN AEROBIC AND ANAEROBIC Blood Culture adequate volume   Culture   Final    NO GROWTH 5 DAYS Performed at [facility], [address] [place], [place] [address]    Report Status  01/18/2022 FINAL  Final  Blood culture (routine x 2)     Status: None   Collection Time: 01/14/22 12:15 AM   Specimen: Left Antecubital; Blood  Result Value Ref Range Status   Specimen Description LEFT ANTECUBITAL  Final   Special Requests   Final    BOTTLES DRAWN AEROBIC AND ANAEROBIC Blood Culture adequate volume   Culture   Final    NO GROWTH 5 DAYS Performed at [facility], [address] [place], [place] [address]    Report Status 01/19/2022 FINAL  Final  SARS Coronavirus 2 by RT PCR (hospital order, performed in [facility] hospital lab) *cepheid single result test* Anterior Nasal Swab     Status: None   Collection Time: 01/14/22  5:21 AM   Specimen: Anterior Nasal Swab  Result Value Ref Range Status   SARS Coronavirus 2 by RT PCR NEGATIVE NEGATIVE Final    Comment: (NOTE) SARS-CoV-2 target nucleic acids are NOT DETECTED.  The SARS-CoV-2 RNA is generally detectable in upper and lower respiratory specimens during the acute phase of infection. The lowest concentration of SARS-CoV-2 viral copies this assay can detect is 250 copies / mL. A negative result does not preclude SARS-CoV-2 infection and should not be used as the sole basis for treatment or other patient management decisions.  A negative result may occur with improper specimen collection / handling, submission of specimen other than nasopharyngeal swab, presence of viral mutation(s) within the areas targeted by this assay, and inadequate number of viral copies (<250 copies / mL). A negative result must be combined with clinical observations, patient history, and epidemiological information.  Fact Sheet for Patients:   https://www.patel.info/  Fact Sheet for Healthcare Providers: https://hall.com/  This test is not yet approved or  cleared by the [place] and has been authorized for detection and/or diagnosis of SARS-CoV-2 by FDA under an Emergency Use  Authorization (EUA).  This EUA will remain in effect (meaning this test can be used) for the duration of the COVID-19 declaration under Section 564(b)(1) of the Act, 21 U.S.C. section 360bbb-3(b)(1), unless the authorization is terminated or revoked sooner.  Performed at [facility], [place] [address], [place], [place] [address]   MRSA Next Gen by PCR, Nasal     Status: None   Collection Time: 01/14/22  6:30 AM   Specimen: Nasal Mucosa; Nasal Swab  Result Value Ref Range Status   MRSA by PCR Next Gen NOT DETECTED NOT DETECTED Final    Comment: (NOTE) The GeneXpert MRSA Assay (FDA approved for NASAL specimens only), is one component of a comprehensive MRSA colonization surveillance program. It is not intended to diagnose MRSA infection nor to guide or monitor treatment for MRSA infections. Test performance is not FDA approved in patients less than 89 years old. Performed at [facility], [place] [address], [place], [place] [address]   Culture, Respiratory w Gram Stain     Status: None   Collection Time: 01/14/22  5:02 PM   Specimen: Tracheal Aspirate; Respiratory  Result Value Ref Range Status   Specimen Description TRACHEAL ASPIRATE  Final   Special Requests NONE  Final   Gram Stain   Final    NO ORGANISMS SEEN MODERATE WBC PRESENT, PREDOMINANTLY PMN    Culture   Final    Normal respiratory flora-no Staph aureus or Pseudomonas seen Performed at [facility], [address], [place], [place] [address]    Report Status 01/17/2022 FINAL  Final     DG Chest Port 1 View  Result Date: 01/20/2022 CLINICAL DATA:  Shortness of breath.  Atrial fibrillation with RVR EXAM: PORTABLE CHEST 1 VIEW COMPARISON:  01/19/2022 FINDINGS: The cardio pericardial silhouette is enlarged. Vascular congestion with interstitial edema noted. No substantial pleural effusion. The visualized bony structures of the thorax are unremarkable. Telemetry leads overlie the chest. IMPRESSION:  Cardiomegaly with vascular congestion and interstitial pulmonary edema, similar to prior. Electronically Signed   By: Misty Stanley M.D.   On: 01/20/2022 07:09   DG CHEST PORT 1 VIEW  Result Date: 01/19/2022 CLINICAL DATA:  Shortness of breath EXAM: PORTABLE CHEST 1 VIEW COMPARISON:  Previous studies including the examination of 01/18/2022 FINDINGS: Transverse diameter of heart is increased. There is partial clearing of interstitial pulmonary edema. No new focal infiltrates are seen. Left lateral CP angle is not included in the image. There is no pneumothorax. IMPRESSION: Partial clearing of pulmonary vascular congestion and pulmonary edema. Electronically Signed   By: Elmer Picker M.D.   On: 01/19/2022 08:10   DG CHEST PORT 1 VIEW  Result Date: 01/18/2022 CLINICAL DATA:  Respiratory distress EXAM: PORTABLE CHEST 1 VIEW COMPARISON:  01/14/2022 FINDINGS: Tubes and lines have been removed in the interval from the prior exam. Cardiac shadow is enlarged. Diffuse vascular congestion is noted with interstitial edema bilaterally. The lung bases are not included on this image. IMPRESSION: Changes of CHF with interstitial edema. Electronically Signed   By: Inez Catalina M.D.   On: 01/18/2022 23:02    Family Communication: None at bedside  Disposition: Status is: Inpatient Remains inpatient appropriate because: Severity of illness, IV diuresis, AMS Planned Discharge Destination:  TBD  Patrecia Pour, MD 01/20/2022 8:29 AM Page by [email or web address]

## 2022-01-21 ENCOUNTER — Inpatient Hospital Stay (HOSPITAL_COMMUNITY): Payer: Medicare Other

## 2022-01-21 DIAGNOSIS — G9340 Encephalopathy, unspecified: Secondary | ICD-10-CM | POA: Diagnosis not present

## 2022-01-21 DIAGNOSIS — J9602 Acute respiratory failure with hypercapnia: Secondary | ICD-10-CM | POA: Diagnosis not present

## 2022-01-21 DIAGNOSIS — I4891 Unspecified atrial fibrillation: Secondary | ICD-10-CM | POA: Diagnosis not present

## 2022-01-21 DIAGNOSIS — I959 Hypotension, unspecified: Secondary | ICD-10-CM | POA: Diagnosis not present

## 2022-01-21 DIAGNOSIS — N179 Acute kidney failure, unspecified: Secondary | ICD-10-CM | POA: Diagnosis not present

## 2022-01-21 LAB — GLUCOSE, CAPILLARY
Glucose-Capillary: 161 mg/dL — ABNORMAL HIGH (ref 70–99)
Glucose-Capillary: 165 mg/dL — ABNORMAL HIGH (ref 70–99)
Glucose-Capillary: 173 mg/dL — ABNORMAL HIGH (ref 70–99)
Glucose-Capillary: 177 mg/dL — ABNORMAL HIGH (ref 70–99)
Glucose-Capillary: 189 mg/dL — ABNORMAL HIGH (ref 70–99)
Glucose-Capillary: 210 mg/dL — ABNORMAL HIGH (ref 70–99)

## 2022-01-21 LAB — BASIC METABOLIC PANEL
Anion gap: 14 (ref 5–15)
BUN: 51 mg/dL — ABNORMAL HIGH (ref 8–23)
CO2: 24 mmol/L (ref 22–32)
Calcium: 9 mg/dL (ref 8.9–10.3)
Chloride: 103 mmol/L (ref 98–111)
Creatinine, Ser: 2.06 mg/dL — ABNORMAL HIGH (ref 0.61–1.24)
GFR, Estimated: 35 mL/min — ABNORMAL LOW (ref >60)
Glucose, Bld: 190 mg/dL — ABNORMAL HIGH (ref 70–99)
Potassium: 4.2 mmol/L (ref 3.5–5.1)
Sodium: 141 mmol/L (ref 135–145)

## 2022-01-21 LAB — CBC
HCT: 44.4 % (ref 39.0–52.0)
Hemoglobin: 14.6 g/dL (ref 13.0–17.0)
MCH: 28.7 pg (ref 26.0–34.0)
MCHC: 32.9 g/dL (ref 30.0–36.0)
MCV: 87.4 fL (ref 80.0–100.0)
Platelets: 117 10*3/uL — ABNORMAL LOW (ref 150–400)
RBC: 5.08 MIL/uL (ref 4.22–5.81)
RDW: 16.3 % — ABNORMAL HIGH (ref 11.5–15.5)
WBC: 11.2 10*3/uL — ABNORMAL HIGH (ref 4.0–10.5)
nRBC: 1 % — ABNORMAL HIGH (ref 0.0–0.2)

## 2022-01-21 LAB — MAGNESIUM: Magnesium: 2.2 mg/dL (ref 1.7–2.4)

## 2022-01-21 MED ORDER — AMIODARONE HCL 200 MG PO TABS
200.0000 mg | ORAL_TABLET | Freq: Two times a day (BID) | ORAL | Status: DC
  Administered 2022-01-21 – 2022-01-23 (×4): 200 mg via ORAL
  Filled 2022-01-21 (×4): qty 1

## 2022-01-21 MED ORDER — INSULIN GLARGINE-YFGN 100 UNIT/ML ~~LOC~~ SOLN
14.0000 [IU] | Freq: Every day | SUBCUTANEOUS | Status: DC
  Administered 2022-01-21 – 2022-01-25 (×5): 14 [IU] via SUBCUTANEOUS
  Filled 2022-01-21 (×6): qty 0.14

## 2022-01-21 MED ORDER — FUROSEMIDE 10 MG/ML IJ SOLN
40.0000 mg | Freq: Once | INTRAMUSCULAR | Status: AC
  Administered 2022-01-21: 40 mg via INTRAVENOUS

## 2022-01-21 MED ORDER — LORAZEPAM 2 MG/ML IJ SOLN: 0.5000 mg | Freq: Four times a day (QID) | INTRAMUSCULAR | Status: DC | PRN

## 2022-01-21 NOTE — Significant Event (Signed)
Rapid Response Event Note   Reason for Call :  SOB, diaphoretic  Initial Focused Assessment:  Pt lying in bed with eyes closed. His breathing is shallow/labored. He is very air hungry. He will open his eyes to voice, answer questions(he isn't able to speak in complete sentences), follow commands, and move all extremities. He is c/o of being hot. With no stimulation, pt falls back asleep easily. His lungs are diminished. ABD large, soft. Skin is cool/diaphoretic.  T-97, HR-88, BP-131/96, RR-30s, SpO2-97% on 2L [place].  Pt received scheduled Ambien at 2242, trazodone and oxy IR at 0000.  Attempted to place pt on his CPAP machine-he refuses to wear it.   Interventions:  CBG-180 EKG-Afib, low voltage  QRS, L post fascicular block, Cannot r/o ANT infarct, age undetermined, T wave abnormality, consider inferolateral ischemia PCXR-Stable changes of CHF 40mg  lasix IV Plan of Care:  Give lasix and monitor response. Continue to monitor closely. Call RRT if further assistance needed.   Event Summary:   MD Notified: Dr. notified and came to bedside Call Time:0029 Arrival Time:0036 End Time:  Arlean Hopping, RN

## 2022-01-21 NOTE — Progress Notes (Addendum)
Progress Note  Patient: Taylor [name] YQI:[id number] DOB: 1955-11-07  DOA: 01/13/2022  DOS: 01/21/2022    Brief hospital course: Taylor [name] is a 66 y.o. male with a history of NICM, chronic HFrEF (LVEF 35-40%), CAD, permanent atrial fibrillation s/p failed DCCV, HTN, HLD, T2DM, morbid obesity, and chronic back pain on oxycodone, dilaudid, lyrica who presented to the ED 8/8 with lethargy, nausea. He was encephalopathic with leg edema, in renal failure with creatinine of 9.94 from baseline of 1.04, hypotensive requiring pressors. He was transferred from [facility] ICU and underwent CRRT 8/9 - 8/11, extubated 8/12. Neurology was involved for concern of generalized tonic-clonic seizure which was treated with depakote. EEG and MRI were negative, so this was stopped. Lyrica has been discontinued as well.   Hospitalization complicated by acute urinary retention, RVR and pulmonary edema, though this is improving and he is felt stable for transfer to medical service to pick up 8/4.   Assessment and Plan: Acute hypoxic respiratory failure: Pt states he's been told he needed oxygen in the past but never pursued it.  - Wean oxygen as tolerated with diuresis as below.  - OOB, IS encouraged.   Acute on chronic HFrEF: With pulmonary edema, likely related to rapid atrial fibrillation. - Continue IV diuresis, monitor I/O, weights. Home torsemide is 40mg  qAM, 20mg  qPM. Continue lasix 40mg  IV BID - Holding SGLT2i, ACEi in setting of renal failure  Permanent atrial fibrillation with RVR: s/p failed DCCV March and Sept 2022.  - Continue metoprolol 200mg , convert IV amiodarone to PO. Rate control significantly improved on this.  - Cardiology consulted for further recommendations - Continue DOAC - Said not to be RFA candidate due to morbid obesity.  - Supp K to keep at goal 4. Mg at goal >2. Note TSH 3.762.  Acute toxic and metabolic encephalopathy: Due to polypharmacy in the setting of renal failure and  uremia. - Continue delirium precautions, optimizing metabolic parameters, holding oxycontin, and reduced ambien to 5mg  and dilaudid dose to 2mg . - Started qHS zyprexa with improvement.  Acute renal failure due to ATN: Resolved, s/p CRRT 8/9 - 8/11 with subsequent good UOP and Cr declining.  - Monitor UOP, Cr.  - Creatinine baseline may be near 2.   Morbid obesity: Body mass index is 45.87 kg/m.  - Pt's PCP has discussed ozempic, will plan to trial (has had rybelsus intolerance (nausea)) after discharge.  Acute urinary retention:  - Continue foley for now given IV diuresis and failed voiding trial.  - Continue new tamsulosin, plan repeat voiding trial prior to DC.   Chronic normocytic anemia:  - Mild, monitor while on anticoagulation. Hgb 13.3 today  Thrombocytopenia: Unclear etiology, mild. Suggest recheck intermittently  HTN: Stable  HLD:  - Holding statin for now  T2DM:  - Started glargine, increase to 14u qHS, continue SSI, add mealtime coverage 3u.  - Holding home meds with renal impairment.   Leukocytosis: Noted. Also had diarrhea though this is resolved and abd exam benign. His urinary symptoms have actually improved and he's afebrile.  - Ciontinue monitoring off abx.   Subjective: Did get some sleep last night and feels better this morning, knows he needs to get up a moving around more. Feels he'll be able to eat better today. No chest pain or dyspnea or palpitations. Urinary pain resolved. Diarrhea yesterday, none this AM.   Objective: Vitals:   01/21/22 0300 01/21/22 0600 01/21/22 0751 01/21/22 1549  BP: 113/89 137/89 128/86 (!) 119/97  Pulse: 84 88 89 83  Resp: 20 17 17    Temp: (!) 97.4 F (36.3 C)  (!) 97.5 F (36.4 C) 97.8 F (36.6 C)  TempSrc: Axillary  Oral Oral  SpO2: 94% 96% 99% 96%  Weight:      Height:       Gen: 66 y.o. male in no distress Pulm: Nonlabored tachypnea, clearing crackles, no wheezes, diminished at bases. CV: Irreg irreg without  murmur, rub, or gallop. No JVD, stable-to-slightly improved dependent edema. GI: Abdomen soft, non-tender, protuberant, non-distended, with normoactive bowel sounds.  Ext: Warm, no deformities Skin: No rashes, lesions or ulcers on visualized skin. Neuro: Alert and oriented this AM, no hallucinations. No focal neurological deficits. Psych: Judgement and insight appear fair. Mood euthymic & affect congruent. Behavior is appropriate.     Data Personally reviewed: CBC: Recent Labs  Lab 01/17/22 0519 01/18/22 0504 01/19/22 0712 01/20/22 0508 01/21/22 0425  WBC 5.6 9.3 8.6 11.7* 11.2*  HGB 11.5* 12.5* 12.8* 13.3 14.6  HCT 34.0* 37.5* 39.3 41.1 44.4  MCV 83.7 85.0 86.9 87.8 87.4  PLT 151 164 143* 176 117*   Basic Metabolic Panel: Recent Labs  Lab 01/15/22 0327 01/15/22 0930 01/15/22 1542 01/16/22 0358 01/16/22 1431 01/17/22 0520 01/18/22 0504 01/18/22 2334 01/19/22 0712 01/20/22 0508 01/21/22 0425  NA 132*   < > 135 134* 136 138 140 138 140 140 141  K 3.7   < > 3.3* 3.9 4.1 3.4* 3.9 4.4 3.9 4.2 4.2  CL 96*  --  99 102 102 106 104 105 104 104 103  CO2 20*  --  20* 22 23 22 23  20* 24 23 24   GLUCOSE 108*  --  144* 146* 178* 161* 174* 221* 183* 211* 190*  BUN 56*  --  48* 33* 29* 35* 37* 41* 42* 47* 51*  CREATININE 7.87*  --  6.71* 4.33* 3.58* 3.44* 2.70* 2.38* 2.31* 2.08* 2.06*  CALCIUM 7.4*  --  7.2* 7.3* 8.0* 8.1* 8.7* 9.2 8.9 9.0 9.0  MG 2.1  --   --  2.3  --  2.4  --  2.3  --  2.2 2.2  PHOS 7.3*  --  5.2* 3.6 3.3  --  4.4  --   --   --   --    < > = values in this interval not displayed.   GFR: Estimated Creatinine Clearance: 56.9 mL/min (A) (by C-G formula based on SCr of 2.06 mg/dL (H)). Liver Function Tests: Recent Labs  Lab 01/15/22 0327 01/15/22 1542 01/16/22 0358 01/16/22 1431 01/18/22 0504  ALBUMIN 2.7* 2.4* 2.5* 2.4* 2.6*   Urine analysis:    Component Value Date/Time   COLORURINE YELLOW 01/14/2022 0631   APPEARANCEUR HAZY (A) 01/14/2022 0631    LABSPEC 1.016 01/14/2022 0631   PHURINE 5.0 01/14/2022 0631   GLUCOSEU 50 (A) 01/14/2022 0631   HGBUR MODERATE (A) 01/14/2022 0631   BILIRUBINUR NEGATIVE 01/14/2022 0631   KETONESUR NEGATIVE 01/14/2022 0631   PROTEINUR 100 (A) 01/14/2022 0631   NITRITE NEGATIVE 01/14/2022 0631   LEUKOCYTESUR NEGATIVE 01/14/2022 0631   Recent Results (from the past 240 hour(s))  Blood culture (routine x 2)     Status: None   Collection Time: 01/13/22 11:31 PM   Specimen: BLOOD RIGHT FOREARM  Result Value Ref Range Status   Specimen Description BLOOD RIGHT FOREARM  Final   Special Requests   Final    BOTTLES DRAWN AEROBIC AND ANAEROBIC Blood Culture adequate volume   Culture  Final    NO GROWTH 5 DAYS Performed at [facility], [address], [place], [place] [address]    Report Status 01/18/2022 FINAL  Final  Blood culture (routine x 2)     Status: None   Collection Time: 01/14/22 12:15 AM   Specimen: Left Antecubital; Blood  Result Value Ref Range Status   Specimen Description LEFT ANTECUBITAL  Final   Special Requests   Final    BOTTLES DRAWN AEROBIC AND ANAEROBIC Blood Culture adequate volume   Culture   Final    NO GROWTH 5 DAYS Performed at [facility], [address] [place], [place] [address]    Report Status 01/19/2022 FINAL  Final  SARS Coronavirus 2 by RT PCR (hospital order, performed in [facility] hospital lab) *cepheid single result test* Anterior Nasal Swab     Status: None   Collection Time: 01/14/22  5:21 AM   Specimen: Anterior Nasal Swab  Result Value Ref Range Status   SARS Coronavirus 2 by RT PCR NEGATIVE NEGATIVE Final    Comment: (NOTE) SARS-CoV-2 target nucleic acids are NOT DETECTED.  The SARS-CoV-2 RNA is generally detectable in upper and lower respiratory specimens during the acute phase of infection. The lowest concentration of SARS-CoV-2 viral copies this assay can detect is 250 copies / mL. A negative result does not preclude SARS-CoV-2 infection and  should not be used as the sole basis for treatment or other patient management decisions.  A negative result may occur with improper specimen collection / handling, submission of specimen other than nasopharyngeal swab, presence of viral mutation(s) within the areas targeted by this assay, and inadequate number of viral copies (<250 copies / mL). A negative result must be combined with clinical observations, patient history, and epidemiological information.  Fact Sheet for Patients:   RoadLapTop.co.za  Fact Sheet for Healthcare Providers: http://kim-miller.com/  This test is not yet approved or  cleared by the [place] FDA and has been authorized for detection and/or diagnosis of SARS-CoV-2 by FDA under an Emergency Use Authorization (EUA).  This EUA will remain in effect (meaning this test can be used) for the duration of the COVID-19 declaration under Section 564(b)(1) of the Act, 21 U.S.C. section 360bbb-3(b)(1), unless the authorization is terminated or revoked sooner.  Performed at [facility], [address], [place], [place] [address]   MRSA Next Gen by PCR, Nasal     Status: None   Collection Time: 01/14/22  6:30 AM   Specimen: Nasal Mucosa; Nasal Swab  Result Value Ref Range Status   MRSA by PCR Next Gen NOT DETECTED NOT DETECTED Final    Comment: (NOTE) The GeneXpert MRSA Assay (FDA approved for NASAL specimens only), is one component of a comprehensive MRSA colonization surveillance program. It is not intended to diagnose MRSA infection nor to guide or monitor treatment for MRSA infections. Test performance is not FDA approved in patients less than 5 years old. Performed at [facility], [address], [place], [place] [address]   Culture, Respiratory w Gram Stain     Status: None   Collection Time: 01/14/22  5:02 PM   Specimen: Tracheal Aspirate; Respiratory  Result Value Ref Range Status   Specimen  Description TRACHEAL ASPIRATE  Final   Special Requests NONE  Final   Gram Stain   Final    NO ORGANISMS SEEN MODERATE WBC PRESENT, PREDOMINANTLY PMN    Culture   Final    Normal respiratory flora-no Staph  aureus or Pseudomonas seen Performed at [facility], [address], [place], [place] [address]    Report Status 01/17/2022 FINAL  Final     DG CHEST PORT 1 VIEW  Result Date: 01/21/2022 CLINICAL DATA:  Shortness of breath EXAM: PORTABLE CHEST 1 VIEW COMPARISON:  01/20/2022 FINDINGS: Cardiac shadow is enlarged but stable. Increased central vascular congestion is noted with mild edema. The overall appearance is stable given some variations in the film. No bony abnormality is noted. IMPRESSION: Stable changes of CHF. Electronically Signed   By: Alcide Clever M.D.   On: 01/21/2022 01:26   DG Chest Port 1 View  Result Date: 01/20/2022 CLINICAL DATA:  Shortness of breath.  Atrial fibrillation with RVR EXAM: PORTABLE CHEST 1 VIEW COMPARISON:  01/19/2022 FINDINGS: The cardio pericardial silhouette is enlarged. Vascular congestion with interstitial edema noted. No substantial pleural effusion. The visualized bony structures of the thorax are unremarkable. Telemetry leads overlie the chest. IMPRESSION: Cardiomegaly with vascular congestion and interstitial pulmonary edema, similar to prior. Electronically Signed   By: Kennith Center M.D.   On: 01/20/2022 07:09    Family Communication: Brother by phone yesterday, at bedside this AM  Disposition: Status is: Inpatient Remains inpatient appropriate because: Severity of illness, IV diuresis  Planned Discharge Destination: Home with home health  Tyrone Nine, MD 01/21/2022 8:01 PM Page by [email or web address]

## 2022-01-21 NOTE — Progress Notes (Signed)
Physical Therapy Treatment Patient Details Name: Taylor [name] MRN: [id number] DOB: 1955/12/31 Today's Date: 01/21/2022   History of Present Illness 66 year old who presented with increased lethargy and found with AKI requiring dialysis; Started CRRT 8/9 and held CRRT 8/11, monitoring for renal recovery; intubated 8/9- 8/12. 8/13 developed afib.  PMH includes: HTN, chronic systolic heart failure, atrial fibrillation, DM2 and chronic pain medications on home dilaudid, oxycontin, lyrica, ambien    PT Comments    Pt progressing slowly with PT goals, overall requiring light assist for taking pivotal steps to reach recliner today. Pt fatigues quickly during bed mobility and standing, requires rest breaks throughout session as needed. Pt noting dyspnea on exertion with SpO2 in 80s on 2LO2 intermittently during session, recovered to 90s quickly with rest break. Will continue to follow.     Recommendations for follow up therapy are one component of a multi-disciplinary discharge planning process, led by the attending physician.  Recommendations may be updated based on patient status, additional functional criteria and insurance authorization.  Follow Up Recommendations  Home health PT     Assistance Recommended at Discharge Frequent or constant Supervision/Assistance  Patient can return home with the following A little help with walking and/or transfers;A little help with bathing/dressing/bathroom;Assistance with cooking/housework;Assist for transportation   Equipment Recommendations  Rolling walker (2 wheels);BSC/3in1    Recommendations for Other Services       Precautions / Restrictions Precautions Precautions: Fall Precaution Comments: HR, sats Restrictions Weight Bearing Restrictions: No     Mobility  Bed Mobility Overal bed mobility: Needs Assistance Bed Mobility: Supine to Sit     Supine to sit: Min assist, HOB elevated     General bed mobility comments: assist for  trunk elevation via HHA, LE progression to EOB. Increased time, pt taking UE-propped rest break mid-scoot to EOB    Transfers Overall transfer level: Needs assistance Equipment used: Rolling walker (2 wheels) Transfers: Sit to/from Stand, Bed to chair/wheelchair/BSC Sit to Stand: Min assist   Step pivot transfers: Min assist       General transfer comment: assist for power up, steadying, pivotal steps to reach recliner. x1 standing rest break. SpO2 min observed 82% on 2LO2, recovered quickly to 90s with rest - suspect poor pleth as well. DOE 2/4    Ambulation/Gait                   Stairs             Wheelchair Mobility    Modified Rankin (Stroke Patients Only)       Balance Overall balance assessment: Needs assistance Sitting-balance support: No upper extremity supported, Feet supported Sitting balance-Leahy Scale: Fair     Standing balance support: Bilateral upper extremity supported, During functional activity Standing balance-Leahy Scale: Poor Standing balance comment: relies on at least 1 UE support                            Cognition Arousal/Alertness: Awake/alert Behavior During Therapy: WFL for tasks assessed/performed Overall Cognitive Status: Impaired/Different from baseline Area of Impairment: Attention, Following commands, Safety/judgement, Problem solving                   Current Attention Level: Sustained   Following Commands: Follows one step commands with increased time Safety/Judgement: Decreased awareness of safety   Problem Solving: Slow processing, Requires verbal cues, Decreased initiation, Difficulty sequencing General Comments: pt can be tangential in  conversation, requires cues to stay on task. Pt states he hasn't been OOB in 6 days, but worked with PT x2 days ago        Exercises      General Comments        Pertinent Vitals/Pain Pain Assessment Pain Assessment: Faces Faces Pain Scale: Hurts a  little bit Pain Location: back Pain Descriptors / Indicators: Sore Pain Intervention(s): Limited activity within patient's tolerance, Monitored during session, Repositioned    Home Living                          Prior Function            PT Goals (current goals can now be found in the care plan section) Acute Rehab PT Goals PT Goal Formulation: With patient Time For Goal Achievement: 02/01/22 Potential to Achieve Goals: Good Progress towards PT goals: Progressing toward goals    Frequency    Min 3X/week      PT Plan Current plan remains appropriate    Co-evaluation              AM-PAC PT "6 Clicks" Mobility   Outcome Measure  Help needed turning from your back to your side while in a flat bed without using bedrails?: A Little Help needed moving from lying on your back to sitting on the side of a flat bed without using bedrails?: A Little Help needed moving to and from a bed to a chair (including a wheelchair)?: A Little Help needed standing up from a chair using your arms (e.g., wheelchair or bedside chair)?: A Little Help needed to walk in hospital room?: A Little Help needed climbing 3-5 steps with a railing? : A Lot 6 Click Score: 17    End of Session   Activity Tolerance: Patient tolerated treatment well;Patient limited by fatigue Patient left: in chair;with call bell/phone within reach;with chair alarm set Nurse Communication: Mobility status PT Visit Diagnosis: Other abnormalities of gait and mobility (R26.89);Difficulty in walking, not elsewhere classified (R26.2)     Time: 1126-1202 PT Time Calculation (min) (ACUTE ONLY): 36 min  Charges:  $Therapeutic Activity: 23-37 mins                     Marye Round, PT DPT Acute Rehabilitation Services Pager [phone]  Office [phone]    Truddie Coco 01/21/2022, 12:31 PM

## 2022-01-21 NOTE — Progress Notes (Signed)
Progress Note  Patient Name: Taylor [name] Date of Encounter: 01/21/2022  [facility] Cardiologist: Debbe Odea, MD   Subjective   No chest pain or shortness of breath at present.  Patient on room air.  Had an episode of shortness of breath last night.  Inpatient Medications    Scheduled Meds:  apixaban  5 mg Oral BID   Chlorhexidine Gluconate Cloth  6 each Topical Q0600   docusate sodium  100 mg Oral BID   fiber supplement (BANATROL TF)  60 mL Oral BID   furosemide  40 mg Intravenous BID   insulin aspart  0-20 Units Subcutaneous Q4H   insulin aspart  3 Units Subcutaneous TID WC   insulin glargine-yfgn  10 Units Subcutaneous QHS   lidocaine  1 patch Transdermal Q24H   metoprolol tartrate  200 mg Oral BID   multivitamin with minerals  1 tablet Oral Daily   OLANZapine  5 mg Oral QHS   oxyCODONE  10 mg Oral Q6H   polyethylene glycol  17 g Oral Daily   senna  1 tablet Oral Daily   tamsulosin  0.4 mg Oral Daily   traZODone  100 mg Oral QHS   zolpidem  5 mg Oral QHS   Continuous Infusions:  sodium chloride 250 mL (01/19/22 1033)   amiodarone 30 mg/hr (01/21/22 1233)   PRN Meds: HYDROmorphone, ipratropium-albuterol, lip balm, ondansetron (ZOFRAN) IV, mouth rinse, traZODone   Vital Signs    Vitals:   01/21/22 0300 01/21/22 0600 01/21/22 0751 01/21/22 1549  BP: 113/89 137/89 128/86 (!) 119/97  Pulse: 84 88 89 83  Resp: 20 17 17    Temp: (!) 97.4 F (36.3 C)  (!) 97.5 F (36.4 C) 97.8 F (36.6 C)  TempSrc: Axillary  Oral Oral  SpO2: 94% 96% 99% 96%  Weight:      Height:        Intake/Output Summary (Last 24 hours) at 01/21/2022 1914 Last data filed at 01/21/2022 0925 Gross per 24 hour  Intake --  Output 1425 ml  Net -1425 ml      01/19/2022    5:00 AM 01/18/2022    5:00 AM 01/17/2022    5:00 AM  Last 3 Weights  Weight (lbs) 347 lb 10.7 oz 353 lb 6.4 oz 361 lb 15.9 oz  Weight (kg) 157.7 kg 160.3 kg 164.2 kg      Telemetry    Atrial  fibrillation heart rate 100 bpm- Personally Reviewed   Physical Exam  Alert, oriented, obese male GEN: No acute distress.   Neck: No JVD Cardiac: Irregularly irregular, no murmurs, rubs, or gallops.  Respiratory: Clear to auscultation bilaterally. GI: Soft, nontender, non-distended  MS: Mild bilateral lower extremity edema; No deformity. Neuro:  Nonfocal  Psych: Normal affect   Labs    High Sensitivity Troponin:   Recent Labs  Lab 01/13/22 2331 01/14/22 0109  TROPONINIHS 20* 15     Chemistry Recent Labs  Lab 01/16/22 0358 01/16/22 1431 01/17/22 0520 01/18/22 0504 01/18/22 2334 01/19/22 0712 01/20/22 0508 01/21/22 0425  NA 134* 136   < > 140 138 140 140 141  K 3.9 4.1   < > 3.9 4.4 3.9 4.2 4.2  CL 102 102   < > 104 105 104 104 103  CO2 22 23   < > 23 20* 24 23 24   GLUCOSE 146* 178*   < > 174* 221* 183* 211* 190*  BUN 33* 29*   < > 37* 41*  42* 47* 51*  CREATININE 4.33* 3.58*   < > 2.70* 2.38* 2.31* 2.08* 2.06*  CALCIUM 7.3* 8.0*   < > 8.7* 9.2 8.9 9.0 9.0  MG 2.3  --    < >  --  2.3  --  2.2 2.2  ALBUMIN 2.5* 2.4*  --  2.6*  --   --   --   --   GFRNONAA 14* 18*   < > 26* 30* 31* 35* 35*  ANIONGAP 10 11   < > 13 13 12 13 14    < > = values in this interval not displayed.    Lipids  Recent Labs  Lab 01/16/22 0358  TRIG 117    Hematology Recent Labs  Lab 01/19/22 0712 01/20/22 0508 01/21/22 0425  WBC 8.6 11.7* 11.2*  RBC 4.52 4.68 5.08  HGB 12.8* 13.3 14.6  HCT 39.3 41.1 44.4  MCV 86.9 87.8 87.4  MCH 28.3 28.4 28.7  MCHC 32.6 32.4 32.9  RDW 15.9* 16.0* 16.3*  PLT 143* 176 117*   Thyroid No results for input(s): "TSH", "FREET4" in the last 168 hours.  BNP Recent Labs  Lab 01/20/22 0508  BNP 1,077.6*    DDimer No results for input(s): "DDIMER" in the last 168 hours.   Radiology    DG CHEST PORT 1 VIEW  Result Date: 01/21/2022 CLINICAL DATA:  Shortness of breath EXAM: PORTABLE CHEST 1 VIEW COMPARISON:  01/20/2022 FINDINGS: Cardiac shadow is  enlarged but stable. Increased central vascular congestion is noted with mild edema. The overall appearance is stable given some variations in the film. No bony abnormality is noted. IMPRESSION: Stable changes of CHF. Electronically Signed   By: 01/22/2022 M.D.   On: 01/21/2022 01:26   DG Chest Port 1 View  Result Date: 01/20/2022 CLINICAL DATA:  Shortness of breath.  Atrial fibrillation with RVR EXAM: PORTABLE CHEST 1 VIEW COMPARISON:  01/19/2022 FINDINGS: The cardio pericardial silhouette is enlarged. Vascular congestion with interstitial edema noted. No substantial pleural effusion. The visualized bony structures of the thorax are unremarkable. Telemetry leads overlie the chest. IMPRESSION: Cardiomegaly with vascular congestion and interstitial pulmonary edema, similar to prior. Electronically Signed   By: 01/21/2022 M.D.   On: 01/20/2022 07:09     Patient Profile     66 y.o. male who is chronically ill, hospitalized with acute renal failure now improved, cardiology asked to see for management of his permanent atrial fibrillation  Assessment & Plan    1.  Permanent atrial fibrillation: Heart rate was markedly elevated, now improved on high-dose metoprolol and IV amiodarone for rate control.  We will convert him to oral amiodarone.  Continue oral anticoagulation.  Overall appears stable. 2.  Acute on chronic systolic heart failure: Continues with IV diuresis.  Legs look less swollen today.  No more wheezing on exam.  Continue current therapy.  Creatinine is stable at 2.06 today.  Other problems appear stable.  Will discontinue his IV amiodarone and switch him to oral amiodarone 200 mg twice daily.  He may not require this long-term but seems reasonable to use for a period of weeks to control his heart rate.      For questions or updates, please contact [facility] Please consult [email or web address] for contact info under        Signed, 78, MD  01/21/2022, 7:14 PM

## 2022-01-21 NOTE — Progress Notes (Addendum)
Rapid response, RN at bedside to evaluate patient, EKG ordered and chest xray.

## 2022-01-21 NOTE — Progress Notes (Signed)
TRH night cross cover note:   I was notified that the patient is exhibiting evidence of increased work of breathing this evening, and that he had conveyed to RN earlier that he may be experiencing some shortness of breath this evening, while denying any associated chest pain.  He was evaluated by rapid response RN and EKG was obtained, which showed known atrial fibrillation with heart rate 97, nonspecific T wave changes, and no evidence of ST changes, including no evidence of ST elevation.    I evaluated the patient at bedside. His  breathing appears to be somewhat labored, although he is currently denying any shortness of breath, and is otherwise without any acute complaint per my discussions with him.  Remainder of his vital signs at this time appear stable: normotensive, afebrile, oxygen saturation in the high 90s on 2 L nasal cannula, unchanged from earlier in the day.  He continues to refuse nocturnal CPAP in spite of a known diagnosis of obstructive sleep apnea.  Cardiology was consulted earlier in the day for rapid ventricular response in the setting of permanent atrial fibrillation.  He was initiated on IV amiodarone, with plan to convert to oral amiodarone on 01/21/2022.  This is in addition to up-titration of his metoprolol tartrate.  With these measures, his rate control appears improved, with rates noted to be in the 90s.   His hospital course has also been notable for acute on chronic systolic heart failure.  Per cardiology consultation on 01/20/2022, the patient was felt to still be volume long.  He has been having good urine output in response to IV Lasix, with plan to further increase dose of Lasix to 40 mg IV twice daily.  Of note, cardiology noted the patient to be exhibiting evidence of increased work of breathing on the afternoon of 01/20/2022, while acknowledging that the patient was concomitantly denying any shortness of breath at the time.   I repeated chest x-ray this evening to  further evaluate his increased work of breathing, it appears stable relative to CXR from the morning of 01/20/2022, while noting persistent evidence of volume overload, and no e/o pna, pleural effusion, or pneumothorax.   Given cardiology's plan to escalate IV Lasix, along with this evening's chest x-ray showing evidence of persistent volume overload, I have ordered an extra dose of Lasix 40 IV x 1 to be administered at this time. Considered the possibility of diminished compensatory tachycardia from interval improvement in rate control measures resulting in a relative decline in cardiac output in setting of acutely decompensated heart failure.   Of note,  acute PE felt to be less likely given that the patient is chronically anticoagulated on Eliquis.    Newton Pigg, DO Hospitalist

## 2022-01-21 NOTE — TOC Progression Note (Signed)
Transition of Care [facility]) - Progression Note    Patient Details  Name: FIONN STRACKE MRN: [id number] Date of Birth: 1955-11-24  Transition of Care [facility]) CM/SW Contact  Harriet Masson, RN Phone Number: 01/21/2022, 2:50 PM  Clinical Narrative:     Spoke with patient at bedside regarding transition needs. Patient lives with his mother. Patient has a walker and declines 3&1 due to his bathroom being 4 steps from his bed.  Patient agreeable to home health. Patient defers to [facility] to find highly rated agency. Tresa Endo with [facility] accepted referral. Address, Phone number and PCP verified.  TOC will continue to follow for needs. Needs Home health PT and OT orders Expected Discharge Plan: Home w Home Health Services Barriers to Discharge: Continued Medical Work up  Expected Discharge Plan and Services Expected Discharge Plan: Home w Home Health Services   Discharge Planning Services: CM Consult Post Acute Care Choice: Home Health Living arrangements for the past 2 months: Single Family Home                           HH Arranged: PT, OT HH Agency: [facility] Date [facility] Agency Contacted: 01/21/22 Time HH Agency Contacted: [phone] Representative spoke with at [facility] Agency: Tresa Endo   Social Determinants of Health (SDOH) Interventions    Readmission Risk Interventions     No data to display

## 2022-01-21 NOTE — Progress Notes (Signed)
Patient declined CPAP unit still at bedside.

## 2022-01-21 NOTE — Progress Notes (Signed)
Occupational Therapy Treatment Patient Details Name: Taylor [name] MRN: [id number] DOB: September 30, 1955 Today's Date: 01/21/2022   History of present illness 66 year old who presented with increased lethargy and found with AKI requiring dialysis; Started CRRT 8/9 and held CRRT 8/11, monitoring for renal recovery; intubated 8/9- 8/12. 8/13 developed afib.  PMH includes: HTN, chronic systolic heart failure, atrial fibrillation, DM2 and chronic pain medications on home dilaudid, oxycontin, lyrica, ambien   OT comments  Patient received in supine and was reluctant to participate due to complaints of fatigue from getting OOB earlier with PT. Patient agreed to get on EOB to addess grooming and UE HEP. Patient was min assist and increased time to get to EOB. Patient performed grooming with setup and increased time. Patient performed BUE shoulder abduction and elbow flexion and extension exercises with increased time for recovery between exercises. Patient stood from EOB to step closer to EOB with min assist and min assist to return to supine. Patient demonstrated decreased activity tolerance and further need for increased therapy to increase independence and safety with self care and functional transfers.    Recommendations for follow up therapy are one component of a multi-disciplinary discharge planning process, led by the attending physician.  Recommendations may be updated based on patient status, additional functional criteria and insurance authorization.    Follow Up Recommendations  Home health OT    Assistance Recommended at Discharge Intermittent Supervision/Assistance  Patient can return home with the following  A little help with walking and/or transfers;A lot of help with bathing/dressing/bathroom;Assistance with cooking/housework;Direct supervision/assist for medications management;Direct supervision/assist for financial management;Assist for transportation;Help with stairs or ramp for  entrance   Equipment Recommendations  BSC/3in1 (bariatric)    Recommendations for Other Services      Precautions / Restrictions Precautions Precautions: Fall Precaution Comments: HR, sats Restrictions Weight Bearing Restrictions: No       Mobility Bed Mobility Overal bed mobility: Needs Assistance Bed Mobility: Supine to Sit, Sit to Supine     Supine to sit: Min assist, HOB elevated Sit to supine: Min assist   General bed mobility comments: assist with trunk to get to EOB and assist with BLEs to return to supine    Transfers Overall transfer level: Needs assistance Equipment used: None Transfers: Sit to/from Stand Sit to Stand: Min assist           General transfer comment: stood from EOB to step towards [facility]     Balance Overall balance assessment: Needs assistance Sitting-balance support: No upper extremity supported, Feet supported Sitting balance-Leahy Scale: Fair Sitting balance - Comments: able to sit on EOB without assistance   Standing balance support: Bilateral upper extremity supported, During functional activity Standing balance-Leahy Scale: Poor Standing balance comment: reliant on bed rail and theraist to stand from EOB                           ADL either performed or assessed with clinical judgement   ADL Overall ADL's : Needs assistance/impaired     Grooming: Wash/dry hands;Wash/dry face;Set up;Sitting Grooming Details (indicate cue type and reason): on EOB                               General ADL Comments: declined standing for grooming due to complaints of fatigue from sitting up earlier    [facility] Assessment  Vision       Perception     Praxis      Cognition Arousal/Alertness: Awake/alert Behavior During Therapy: WFL for tasks assessed/performed Overall Cognitive Status: Impaired/Different from baseline Area of Impairment: Attention, Following commands, Safety/judgement,  Problem solving                   Current Attention Level: Sustained   Following Commands: Follows one step commands with increased time Safety/Judgement: Decreased awareness of safety   Problem Solving: Slow processing, Requires verbal cues, Decreased initiation, Difficulty sequencing General Comments: required frequent cues to stay on task        Exercises Exercises: General Upper Extremity General Exercises - Upper Extremity Shoulder ABduction: Strengthening, Both, 10 reps, Seated, Theraband Theraband Level (Shoulder Abduction): Level 1 (Yellow) Elbow Flexion: Strengthening, Both, 10 reps, Seated, Theraband Theraband Level (Elbow Flexion): Level 1 (Yellow) Elbow Extension: Strengthening, Both, 10 reps, Seated, Theraband Theraband Level (Elbow Extension): Level 1 (Yellow)    Shoulder Instructions       General Comments      Pertinent Vitals/ Pain       Pain Assessment Pain Assessment: Faces Faces Pain Scale: Hurts a little bit Pain Location: back Pain Descriptors / Indicators: Sore Pain Intervention(s): Monitored during session, Premedicated before session, Repositioned  Home Living                                          Prior Functioning/Environment              Frequency  Min 2X/week        Progress Toward Goals  OT Goals(current goals can now be found in the care plan section)  Progress towards OT goals: Progressing toward goals  Acute Rehab OT Goals Patient Stated Goal: get better OT Goal Formulation: With patient Time For Goal Achievement: 02/02/22 Potential to Achieve Goals: Good ADL Goals Pt Will Perform Grooming: with modified independence;standing Pt Will Perform Lower Body Dressing: with modified independence;sit to/from stand;with adaptive equipment;sitting/lateral leans Pt Will Transfer to Toilet: with modified independence;ambulating;bedside commode Pt Will Perform Toileting - Clothing Manipulation and  hygiene: with modified independence;sit to/from stand Pt Will Perform Tub/Shower Transfer: Tub transfer;with modified independence;ambulating;3 in 1;grab bars Pt/caregiver will Perform Home Exercise Program: Increased strength;Both right and left upper extremity;With written HEP provided;With theraband  Plan Discharge plan remains appropriate    Co-evaluation                 AM-PAC OT "6 Clicks" Daily Activity     Outcome Measure   Help from another person eating meals?: None Help from another person taking care of personal grooming?: A Little Help from another person toileting, which includes using toliet, bedpan, or urinal?: A Lot Help from another person bathing (including washing, rinsing, drying)?: A Lot Help from another person to put on and taking off regular upper body clothing?: A Little Help from another person to put on and taking off regular lower body clothing?: A Lot 6 Click Score: 16    End of Session Equipment Utilized During Treatment: Oxygen  OT Visit Diagnosis: Other abnormalities of gait and mobility (R26.89);Muscle weakness (generalized) (M62.81);Other symptoms and signs involving cognitive function   Activity Tolerance Patient tolerated treatment well   Patient Left in bed;with call bell/phone within reach;with bed alarm set;with family/visitor present   Nurse Communication Mobility status  Time: 9728-2060 OT Time Calculation (min): 42 min  Charges: OT General Charges $OT Visit: 1 Visit OT Treatments $Self Care/Home Management : 8-22 mins $Therapeutic Activity: 8-22 mins $Therapeutic Exercise: 8-22 mins  Alfonse Flavors, OTA Acute Rehabilitation Services  Office [phone]   Dewain Penning 01/21/2022, 2:47 PM

## 2022-01-22 DIAGNOSIS — N179 Acute kidney failure, unspecified: Secondary | ICD-10-CM | POA: Diagnosis not present

## 2022-01-22 DIAGNOSIS — J9602 Acute respiratory failure with hypercapnia: Secondary | ICD-10-CM | POA: Diagnosis not present

## 2022-01-22 DIAGNOSIS — I4891 Unspecified atrial fibrillation: Secondary | ICD-10-CM | POA: Diagnosis not present

## 2022-01-22 DIAGNOSIS — G9340 Encephalopathy, unspecified: Secondary | ICD-10-CM | POA: Diagnosis not present

## 2022-01-22 DIAGNOSIS — I959 Hypotension, unspecified: Secondary | ICD-10-CM | POA: Diagnosis not present

## 2022-01-22 LAB — CBC
HCT: 47.9 % (ref 39.0–52.0)
Hemoglobin: 15.2 g/dL (ref 13.0–17.0)
MCH: 28.1 pg (ref 26.0–34.0)
MCHC: 31.7 g/dL (ref 30.0–36.0)
MCV: 88.5 fL (ref 80.0–100.0)
Platelets: 153 10*3/uL (ref 150–400)
RBC: 5.41 MIL/uL (ref 4.22–5.81)
RDW: 16.8 % — ABNORMAL HIGH (ref 11.5–15.5)
WBC: 10 10*3/uL (ref 4.0–10.5)
nRBC: 0.3 % — ABNORMAL HIGH (ref 0.0–0.2)

## 2022-01-22 LAB — GLUCOSE, CAPILLARY
Glucose-Capillary: 137 mg/dL — ABNORMAL HIGH (ref 70–99)
Glucose-Capillary: 137 mg/dL — ABNORMAL HIGH (ref 70–99)
Glucose-Capillary: 166 mg/dL — ABNORMAL HIGH (ref 70–99)
Glucose-Capillary: 182 mg/dL — ABNORMAL HIGH (ref 70–99)
Glucose-Capillary: 159 mg/dL — ABNORMAL HIGH (ref 70–99)

## 2022-01-22 LAB — BASIC METABOLIC PANEL
Anion gap: 14 (ref 5–15)
BUN: 46 mg/dL — ABNORMAL HIGH (ref 8–23)
CO2: 27 mmol/L (ref 22–32)
Calcium: 9.3 mg/dL (ref 8.9–10.3)
Chloride: 101 mmol/L (ref 98–111)
Creatinine, Ser: 1.69 mg/dL — ABNORMAL HIGH (ref 0.61–1.24)
GFR, Estimated: 45 mL/min — ABNORMAL LOW (ref >60)
Glucose, Bld: 140 mg/dL — ABNORMAL HIGH (ref 70–99)
Potassium: 3.4 mmol/L — ABNORMAL LOW (ref 3.5–5.1)
Sodium: 142 mmol/L (ref 135–145)

## 2022-01-22 LAB — MAGNESIUM: Magnesium: 2 mg/dL (ref 1.7–2.4)

## 2022-01-22 MED ORDER — OLANZAPINE 5 MG PO TABS
7.5000 mg | ORAL_TABLET | Freq: Every day | ORAL | Status: DC
  Administered 2022-01-22 – 2022-01-25 (×4): 7.5 mg via ORAL
  Filled 2022-01-22 (×4): qty 2

## 2022-01-22 MED ORDER — POTASSIUM CHLORIDE CRYS ER 20 MEQ PO TBCR
40.0000 meq | EXTENDED_RELEASE_TABLET | Freq: Two times a day (BID) | ORAL | Status: AC
  Administered 2022-01-22 (×2): 40 meq via ORAL
  Filled 2022-01-22 (×2): qty 2

## 2022-01-22 NOTE — Progress Notes (Addendum)
Progress Note  Patient Name: Taylor [name] Date of Encounter: 01/22/2022  [facility] Cardiologist: Debbe Odea, MD   Subjective   Family at the bedside.   Inpatient Medications    Scheduled Meds:  amiodarone  200 mg Oral BID   apixaban  5 mg Oral BID   Chlorhexidine Gluconate Cloth  6 each Topical Q0600   docusate sodium  100 mg Oral BID   fiber supplement (BANATROL TF)  60 mL Oral BID   furosemide  40 mg Intravenous BID   insulin aspart  0-20 Units Subcutaneous Q4H   insulin aspart  3 Units Subcutaneous TID WC   insulin glargine-yfgn  14 Units Subcutaneous QHS   lidocaine  1 patch Transdermal Q24H   metoprolol tartrate  200 mg Oral BID   multivitamin with minerals  1 tablet Oral Daily   OLANZapine  5 mg Oral QHS   oxyCODONE  10 mg Oral Q6H   polyethylene glycol  17 g Oral Daily   potassium chloride  40 mEq Oral BID   senna  1 tablet Oral Daily   tamsulosin  0.4 mg Oral Daily   traZODone  100 mg Oral QHS   zolpidem  5 mg Oral QHS   Continuous Infusions:  sodium chloride 250 mL (01/19/22 1033)   PRN Meds: HYDROmorphone, ipratropium-albuterol, lip balm, ondansetron (ZOFRAN) IV, mouth rinse, traZODone   Vital Signs    Vitals:   01/22/22 0430 01/22/22 0500 01/22/22 0753 01/22/22 0852  BP: (!) 122/92  123/79 122/87  Pulse: 89  88 (!) 105  Resp:   18   Temp: 97.6 F (36.4 C)  97.7 F (36.5 C)   TempSrc: Oral  Oral   SpO2: 92%  94%   Weight:  (!) 160.8 kg    Height:        Intake/Output Summary (Last 24 hours) at 01/22/2022 0921 Last data filed at 01/22/2022 0556 Gross per 24 hour  Intake 649.45 ml  Output 3275 ml  Net -2625.55 ml      01/22/2022    5:00 AM 01/19/2022    5:00 AM 01/18/2022    5:00 AM  Last 3 Weights  Weight (lbs) 354 lb 8 oz 347 lb 10.7 oz 353 lb 6.4 oz  Weight (kg) 160.8 kg 157.7 kg 160.3 kg      Telemetry    Afib 90-100s - Personally Reviewed  ECG    No tracing this morning  Physical Exam   GEN: Obese older  male, wearing [place] @2L  Neck: No JVD Cardiac: Irreg Irreg, no murmurs, rubs, or gallops.  Respiratory: Clear to auscultation bilaterally. GI: Soft, nontender, non-distended  MS: 1+ LE edema; No deformity. Neuro:  Nonfocal  Psych: Normal affect   Labs    High Sensitivity Troponin:   Recent Labs  Lab 01/13/22 2331 01/14/22 0109  TROPONINIHS 20* 15     Chemistry Recent Labs  Lab 01/16/22 0358 01/16/22 1431 01/17/22 0520 01/18/22 0504 01/18/22 2334 01/20/22 0508 01/21/22 0425 01/22/22 0500  NA 134* 136   < > 140   < > 140 141 142  K 3.9 4.1   < > 3.9   < > 4.2 4.2 3.4*  CL 102 102   < > 104   < > 104 103 101  CO2 22 23   < > 23   < > 23 24 27   GLUCOSE 146* 178*   < > 174*   < > 211* 190* 140*  BUN 33* 29*   < >  37*   < > 47* 51* 46*  CREATININE 4.33* 3.58*   < > 2.70*   < > 2.08* 2.06* 1.69*  CALCIUM 7.3* 8.0*   < > 8.7*   < > 9.0 9.0 9.3  MG 2.3  --    < >  --    < > 2.2 2.2 2.0  ALBUMIN 2.5* 2.4*  --  2.6*  --   --   --   --   GFRNONAA 14* 18*   < > 26*   < > 35* 35* 45*  ANIONGAP 10 11   < > 13   < > 13 14 14    < > = values in this interval not displayed.    Lipids  Recent Labs  Lab 01/16/22 0358  TRIG 117    Hematology Recent Labs  Lab 01/20/22 0508 01/21/22 0425 01/22/22 0500  WBC 11.7* 11.2* 10.0  RBC 4.68 5.08 5.41  HGB 13.3 14.6 15.2  HCT 41.1 44.4 47.9  MCV 87.8 87.4 88.5  MCH 28.4 28.7 28.1  MCHC 32.4 32.9 31.7  RDW 16.0* 16.3* 16.8*  PLT 176 117* 153   Thyroid No results for input(s): "TSH", "FREET4" in the last 168 hours.  BNP Recent Labs  Lab 01/20/22 0508  BNP 1,077.6*    DDimer No results for input(s): "DDIMER" in the last 168 hours.   Radiology    DG CHEST PORT 1 VIEW  Result Date: 01/21/2022 CLINICAL DATA:  Shortness of breath EXAM: PORTABLE CHEST 1 VIEW COMPARISON:  01/20/2022 FINDINGS: Cardiac shadow is enlarged but stable. Increased central vascular congestion is noted with mild edema. The overall appearance is stable given  some variations in the film. No bony abnormality is noted. IMPRESSION: Stable changes of CHF. Electronically Signed   By: 01/22/2022 M.D.   On: 01/21/2022 01:26    Cardiac Studies   Echo: 01/14/22  IMPRESSIONS     1. Technically difficult study, very limited views even with contrast  administration   2. Left ventricular ejection fraction, by estimation, is grossly 40 to  45%. The left ventricle has mildly decreased function. Left ventricular  endocardial border not optimally defined to evaluate regional wall motion.  Left ventricular diastolic  parameters are indeterminate.   3. Right ventricule is not well visualized but grossly normal size and  systolic function. There is normal pulmonary artery systolic pressure.   4. The mitral valve is grossly normal. Trivial mitral valve  regurgitation.   5. The aortic valve was not well visualized. Aortic valve regurgitation  is not visualized. No aortic stenosis is present.   6. The inferior vena cava is dilated in size with <50% respiratory  variability, suggesting right atrial pressure of 15 mmHg.   FINDINGS   Left Ventricle: Left ventricular ejection fraction, by estimation, is 40  to 45%. The left ventricle has mildly decreased function. Left ventricular  endocardial border not optimally defined to evaluate regional wall motion.  Definity contrast agent was  given IV to delineate the left ventricular endocardial borders. The left  ventricular internal cavity size was normal in size. There is no left  ventricular hypertrophy. Left ventricular diastolic parameters are  indeterminate.   Right Ventricle: The right ventricular size is not well visualized. Right  vetricular wall thickness was not well visualized. Right ventricular  systolic function was not well visualized. There is normal pulmonary  artery systolic pressure. The tricuspid  regurgitant velocity is 2.14 m/s, and with an assumed right atrial  pressure of 15 mmHg, the  estimated right ventricular systolic pressure is  33.3 mmHg.   Left Atrium: Left atrial size was not well visualized.   Right Atrium: Right atrial size was not well visualized.   Pericardium: Trivial pericardial effusion is present.   Mitral Valve: The mitral valve is grossly normal. Trivial mitral valve  regurgitation.   Tricuspid Valve: The tricuspid valve is normal in structure. Tricuspid  valve regurgitation is trivial.   Aortic Valve: The aortic valve was not well visualized. Aortic valve  regurgitation is not visualized. No aortic stenosis is present.   Pulmonic Valve: The pulmonic valve was not well visualized. Pulmonic valve  regurgitation is not visualized.   Aorta: The aortic root is normal in size and structure.   Venous: The inferior vena cava is dilated in size with less than 50%  respiratory variability, suggesting right atrial pressure of 15 mmHg.   IAS/Shunts: The interatrial septum was not well visualized.   Patient Profile     66 y.o. male  with a hx of HTN CAD (60% mLAD, 30% D2), diabetes, permanent atrial fibrillation s/p failed DCCV, NICM last EF 35-40%, OSA, morbid obesity who is being seen 01/20/2022 for the evaluation of atrial fib at the request of Dr. Jarvis Newcomer.  Assessment & Plan    Permanent Afib: rates are reasonably controlled on high dose metoprolol 200mg  BID and amiodarone 200mg  BID. Will continue for now for rate control. May be able to wean as an outpatient -- continue Eliquis 5mg  BID  HFrEF: LVEF 40-45%, normal RV size and function. Normal pulmonary artery systolic pressure. Trival MR. Net - 9L, Cr improving. Volume status improving, but remains volume overloaded. -- continue lasix 40mg  BID  ARF S/p CRRT -- Cr peaked at 10, slowing improving. Now down to 1.69 today -- follow BMET  HTN: well controlled -- continue metoprolol 200mg  BID   For questions or updates, please contact [facility] Please consult [email or web address] for contact info  under   Signed, , NP  01/22/2022, 9:21 AM    Patient seen, examined. Available data reviewed. Agree with findings, assessment, and plan as outlined by , NP. On exam, the patient is alert, oriented, in NAD. Lungs CTA, heart irregularly irregular no murmur, abdomen soft, obese, NT, extremities with 1+ pretibial edema. Agree with recommendations outlined above - continue amiodarone 400 mg BID x 2 weeks for additional rate control, unlikely to continue long-term, continue current beta blocker dosing, and continue IV diuresis to treat volume overload. Will follow with you.  , M.D. 01/22/2022 2:02 PM

## 2022-01-22 NOTE — Progress Notes (Signed)
Pt continues  to decline CPAP °

## 2022-01-22 NOTE — Progress Notes (Signed)
Progress Note  Patient: Taylor [name] [id number] DOB: 1956-02-15  DOA: 01/13/2022  DOS: 01/22/2022    Brief hospital course: IGOR OQUIN is a 66 y.o. male with a history of NICM, chronic HFrEF (LVEF 35-40%), CAD, permanent atrial fibrillation s/p failed DCCV, HTN, HLD, T2DM, morbid obesity, and chronic back pain on oxycodone, dilaudid, lyrica who presented to the ED 8/8 with lethargy, nausea. He was encephalopathic with leg edema, in renal failure with creatinine of 9.94 from baseline of 1.04, hypotensive requiring pressors. He was transferred from [facility] ICU and underwent CRRT 8/9 - 8/11, extubated 8/12. Neurology was involved for concern of generalized tonic-clonic seizure which was treated with depakote. EEG and MRI were negative, so this was stopped. Lyrica has been discontinued as well.   Hospitalization complicated by acute urinary retention, RVR and pulmonary edema, though this is improving and he is felt stable for transfer to medical service to pick up 8/4.   Assessment and Plan: Acute hypoxic respiratory failure: Pt states he's been told he needed oxygen in the past but never pursued it.  - Wean oxygen as tolerated with diuresis as below.  - OOB, IS encouraged.   Acute on chronic HFrEF: With pulmonary edema, likely related to rapid atrial fibrillation. - Continue IV diuresis, remains overloaded, albeit improving. -2.5L yesterday, weights not likely correct. Continue to monitor I/O, weights. Home torsemide is 40mg  qAM, 20mg  qPM. Continue lasix 40mg  IV BID - Holding SGLT2i, ACEi in setting of renal failure  Permanent atrial fibrillation with RVR: s/p failed DCCV March and Sept 2022.  - Continue metoprolol 200mg , converted IV amiodarone to PO which is likely to be continued for now, but not indefinite. - Rate control significantly improved on this.  - Cardiology consulted for further recommendations - Continue DOAC - Said not to be an ablation candidate due to morbid  obesity.  - Supp K to keep at goal 4. Mg at goal =2. Note TSH 3.762.  Hypokalemia: Supplement and monitor  Acute toxic and metabolic encephalopathy: Due to polypharmacy in the setting of renal failure and uremia. - Continue delirium precautions, optimizing metabolic parameters, holding oxycontin, and reduced ambien to 5mg  and dilaudid dose to 2mg . - Started qHS zyprexa with improvement, will increase dose  Acute renal failure due to ATN: Resolved, s/p CRRT 8/9 - 8/11 with subsequent good UOP and Cr declining.  - Monitor UOP, Cr.  - Creatinine baseline unclear, continues improvement.   Morbid obesity: Body mass index is 46.77 kg/m.  - Pt's PCP has discussed ozempic, will plan to trial (has had rybelsus intolerance (nausea)) after discharge.  Acute urinary retention:  - Continue foley for now given IV diuresis and failed voiding trial.  - Continue new tamsulosin, plan repeat voiding trial prior to DC.   Chronic normocytic anemia:  - Mild, monitor while on anticoagulation. Normalized.  Thrombocytopenia: Unclear etiology, resolved.  HTN: Stable  HLD:  - Holding statin for now  T2DM:  - Started glargine, increase to 14u qHS, continue SSI, add mealtime coverage 3u.  - Holding home meds with renal impairment.   Leukocytosis: Noted. Also had diarrhea though this is resolved and abd exam benign. His urinary symptoms have actually improved and he's afebrile.  - Resolved without antimicrobial Tx   Subjective: Took a long time to get to sleep. He's having significant weakness diffusely limiting ability to get up OOB. Brother worried about him going home with that functional level. Pt denies dyspnea or chest pain currently. No palpitations,  leg swelling improving to him.    Objective: Vitals:   01/22/22 0753 01/22/22 0852 01/22/22 1150 01/22/22 1542  BP: 123/79 122/87 113/85 104/82  Pulse: 88 (!) 105 79 78  Resp: 18   18  Temp: 97.7 F (36.5 C)  (!) 97.5 F (36.4 C) 98 F (36.7 C)   TempSrc: Oral  Oral Oral  SpO2: 94%  96% 97%  Weight:      Height:       Gen: 66 y.o. male in no distress Pulm: Nonlabored breathing supplemental oxygen. CV: Irreg irreg with rate in 90's. No murmur, rub, or gallop. No JVD, improved, still 1+ pitting dependent edema. GI: Abdomen soft, non-tender, non-distended, with normoactive bowel sounds.  Ext: Warm, no deformities Skin: No rashes, lesions or ulcers on visualized skin. Neuro: Alert and oriented. No focal neurological deficits. Psych: Judgement and insight appear fair. Mood euthymic & affect congruent. Behavior is appropriate.     Data Personally reviewed: CBC: Recent Labs  Lab 01/18/22 0504 01/19/22 0712 01/20/22 0508 01/21/22 0425 01/22/22 0500  WBC 9.3 8.6 11.7* 11.2* 10.0  HGB 12.5* 12.8* 13.3 14.6 15.2  HCT 37.5* 39.3 41.1 44.4 47.9  MCV 85.0 86.9 87.8 87.4 88.5  PLT 164 143* 176 117* 153   Basic Metabolic Panel: Recent Labs  Lab 01/16/22 0358 01/16/22 1431 01/17/22 0520 01/18/22 0504 01/18/22 2334 01/19/22 0712 01/20/22 0508 01/21/22 0425 01/22/22 0500  NA 134* 136 138 140 138 140 140 141 142  K 3.9 4.1 3.4* 3.9 4.4 3.9 4.2 4.2 3.4*  CL 102 102 106 104 105 104 104 103 101  CO2 22 23 22 23  20* 24 23 24 27   GLUCOSE 146* 178* 161* 174* 221* 183* 211* 190* 140*  BUN 33* 29* 35* 37* 41* 42* 47* 51* 46*  CREATININE 4.33* 3.58* 3.44* 2.70* 2.38* 2.31* 2.08* 2.06* 1.69*  CALCIUM 7.3* 8.0* 8.1* 8.7* 9.2 8.9 9.0 9.0 9.3  MG 2.3  --  2.4  --  2.3  --  2.2 2.2 2.0  PHOS 3.6 3.3  --  4.4  --   --   --   --   --    GFR: Estimated Creatinine Clearance: 70.1 mL/min (A) (by C-G formula based on SCr of 1.69 mg/dL (H)). Liver Function Tests: Recent Labs  Lab 01/16/22 0358 01/16/22 1431 01/18/22 0504  ALBUMIN 2.5* 2.4* 2.6*   Urine analysis:    Component Value Date/Time   COLORURINE YELLOW 01/14/2022 0631   APPEARANCEUR HAZY (A) 01/14/2022 0631   LABSPEC 1.016 01/14/2022 0631   PHURINE 5.0 01/14/2022 0631    GLUCOSEU 50 (A) 01/14/2022 0631   HGBUR MODERATE (A) 01/14/2022 0631   BILIRUBINUR NEGATIVE 01/14/2022 0631   KETONESUR NEGATIVE 01/14/2022 0631   PROTEINUR 100 (A) 01/14/2022 0631   NITRITE NEGATIVE 01/14/2022 0631   LEUKOCYTESUR NEGATIVE 01/14/2022 0631   Recent Results (from the past 240 hour(s))  Blood culture (routine x 2)     Status: None   Collection Time: 01/13/22 11:31 PM   Specimen: BLOOD RIGHT FOREARM  Result Value Ref Range Status   Specimen Description BLOOD RIGHT FOREARM  Final   Special Requests   Final    BOTTLES DRAWN AEROBIC AND ANAEROBIC Blood Culture adequate volume   Culture   Final    NO GROWTH 5 DAYS Performed at [facility], [address], [place], [address] [place]    Report Status 01/18/2022 FINAL  Final  Blood culture (routine x 2)  Status: None   Collection Time: 01/14/22 12:15 AM   Specimen: Left Antecubital; Blood  Result Value Ref Range Status   Specimen Description LEFT ANTECUBITAL  Final   Special Requests   Final    BOTTLES DRAWN AEROBIC AND ANAEROBIC Blood Culture adequate volume   Culture   Final    NO GROWTH 5 DAYS Performed at [facility], [address], [place], [place] [address]    Report Status 01/19/2022 FINAL  Final  SARS Coronavirus 2 by RT PCR (hospital order, performed in [facility] hospital lab) *cepheid single result test* Anterior Nasal Swab     Status: None   Collection Time: 01/14/22  5:21 AM   Specimen: Anterior Nasal Swab  Result Value Ref Range Status   SARS Coronavirus 2 by RT PCR NEGATIVE NEGATIVE Final    Comment: (NOTE) SARS-CoV-2 target nucleic acids are NOT DETECTED.  The SARS-CoV-2 RNA is generally detectable in upper and lower respiratory specimens during the acute phase of infection. The lowest concentration of SARS-CoV-2 viral copies this assay can detect is 250 copies / mL. A negative result does not preclude SARS-CoV-2 infection and should not be used as the sole basis for treatment or other patient  management decisions.  A negative result may occur with improper specimen collection / handling, submission of specimen other than nasopharyngeal swab, presence of viral mutation(s) within the areas targeted by this assay, and inadequate number of viral copies (<250 copies / mL). A negative result must be combined with clinical observations, patient history, and epidemiological information.  Fact Sheet for Patients:   https://www.patel.info/  Fact Sheet for Healthcare Providers: https://hall.com/  This test is not yet approved or  cleared by the [place] FDA and has been authorized for detection and/or diagnosis of SARS-CoV-2 by FDA under an Emergency Use Authorization (EUA).  This EUA will remain in effect (meaning this test can be used) for the duration of the COVID-19 declaration under Section 564(b)(1) of the Act, 21 U.S.C. section 360bbb-3(b)(1), unless the authorization is terminated or revoked sooner.  Performed at [facility], [place] [address], [place], [place] [address]   MRSA Next Gen by PCR, Nasal     Status: None   Collection Time: 01/14/22  6:30 AM   Specimen: Nasal Mucosa; Nasal Swab  Result Value Ref Range Status   MRSA by PCR Next Gen NOT DETECTED NOT DETECTED Final    Comment: (NOTE) The GeneXpert MRSA Assay (FDA approved for NASAL specimens only), is one component of a comprehensive MRSA colonization surveillance program. It is not intended to diagnose MRSA infection nor to guide or monitor treatment for MRSA infections. Test performance is not FDA approved in patients less than 66 years old. Performed at [facility], [place] [address] [place], [place] [address]   Culture, Respiratory w Gram Stain     Status: None   Collection Time: 01/14/22  5:02 PM   Specimen: Tracheal Aspirate; Respiratory  Result Value Ref Range Status   Specimen Description TRACHEAL ASPIRATE  Final   Special Requests NONE  Final    Gram Stain   Final    NO ORGANISMS SEEN MODERATE WBC PRESENT, PREDOMINANTLY PMN    Culture   Final    Normal respiratory flora-no Staph aureus or Pseudomonas seen Performed at [facility], [address] [place], [place] [address]    Report Status 01/17/2022 FINAL  Final     DG CHEST PORT 1 VIEW  Result Date:  01/21/2022 CLINICAL DATA:  Shortness of breath EXAM: PORTABLE CHEST 1 VIEW COMPARISON:  01/20/2022 FINDINGS: Cardiac shadow is enlarged but stable. Increased central vascular congestion is noted with mild edema. The overall appearance is stable given some variations in the film. No bony abnormality is noted. IMPRESSION: Stable changes of CHF. Electronically Signed   By: Alcide Clever M.D.   On: 01/21/2022 01:26    Family Communication: Brother and mother at bedside  Disposition: Status is: Inpatient Remains inpatient appropriate because: Severity of illness, IV diuresis  Planned Discharge Destination: Home with home health currently recommended though patient is growing more deconditioned.  Tyrone Nine, MD 01/22/2022 5:22 PM Page by [email or web address]

## 2022-01-23 DIAGNOSIS — J9602 Acute respiratory failure with hypercapnia: Secondary | ICD-10-CM | POA: Diagnosis not present

## 2022-01-23 DIAGNOSIS — N179 Acute kidney failure, unspecified: Secondary | ICD-10-CM | POA: Diagnosis not present

## 2022-01-23 DIAGNOSIS — I959 Hypotension, unspecified: Secondary | ICD-10-CM | POA: Diagnosis not present

## 2022-01-23 DIAGNOSIS — I4891 Unspecified atrial fibrillation: Secondary | ICD-10-CM | POA: Diagnosis not present

## 2022-01-23 DIAGNOSIS — G9340 Encephalopathy, unspecified: Secondary | ICD-10-CM | POA: Diagnosis not present

## 2022-01-23 LAB — GLUCOSE, CAPILLARY
Glucose-Capillary: 142 mg/dL — ABNORMAL HIGH (ref 70–99)
Glucose-Capillary: 174 mg/dL — ABNORMAL HIGH (ref 70–99)
Glucose-Capillary: 163 mg/dL — ABNORMAL HIGH (ref 70–99)
Glucose-Capillary: 244 mg/dL — ABNORMAL HIGH (ref 70–99)
Glucose-Capillary: 199 mg/dL — ABNORMAL HIGH (ref 70–99)
Glucose-Capillary: 152 mg/dL — ABNORMAL HIGH (ref 70–99)
Glucose-Capillary: 147 mg/dL — ABNORMAL HIGH (ref 70–99)

## 2022-01-23 LAB — MAGNESIUM: Magnesium: 1.8 mg/dL (ref 1.7–2.4)

## 2022-01-23 LAB — BASIC METABOLIC PANEL
Anion gap: 10 (ref 5–15)
BUN: 35 mg/dL — ABNORMAL HIGH (ref 8–23)
CO2: 27 mmol/L (ref 22–32)
Calcium: 8.7 mg/dL — ABNORMAL LOW (ref 8.9–10.3)
Chloride: 105 mmol/L (ref 98–111)
Creatinine, Ser: 1.55 mg/dL — ABNORMAL HIGH (ref 0.61–1.24)
GFR, Estimated: 50 mL/min — ABNORMAL LOW (ref >60)
Glucose, Bld: 180 mg/dL — ABNORMAL HIGH (ref 70–99)
Potassium: 3.5 mmol/L (ref 3.5–5.1)
Sodium: 142 mmol/L (ref 135–145)

## 2022-01-23 LAB — CBC
HCT: 40.7 % (ref 39.0–52.0)
Hemoglobin: 12.9 g/dL — ABNORMAL LOW (ref 13.0–17.0)
MCH: 28.5 pg (ref 26.0–34.0)
MCHC: 31.7 g/dL (ref 30.0–36.0)
MCV: 90 fL (ref 80.0–100.0)
Platelets: 137 10*3/uL — ABNORMAL LOW (ref 150–400)
RBC: 4.52 MIL/uL (ref 4.22–5.81)
RDW: 16.2 % — ABNORMAL HIGH (ref 11.5–15.5)
WBC: 8.9 10*3/uL (ref 4.0–10.5)
nRBC: 0.2 % (ref 0.0–0.2)

## 2022-01-23 MED ORDER — POTASSIUM CHLORIDE CRYS ER 20 MEQ PO TBCR
40.0000 meq | EXTENDED_RELEASE_TABLET | Freq: Two times a day (BID) | ORAL | Status: AC
  Administered 2022-01-23 (×2): 40 meq via ORAL
  Filled 2022-01-23 (×2): qty 2

## 2022-01-23 MED ORDER — MELATONIN 3 MG PO TABS
3.0000 mg | ORAL_TABLET | Freq: Every day | ORAL | Status: DC
  Administered 2022-01-23 – 2022-01-25 (×3): 3 mg via ORAL
  Filled 2022-01-23 (×3): qty 1

## 2022-01-23 MED ORDER — TORSEMIDE 20 MG PO TABS: 40.0000 mg | ORAL_TABLET | Freq: Every day | ORAL | Status: DC

## 2022-01-23 NOTE — Progress Notes (Signed)
Pt refusing cpap for the night. Informed pt to call this RT if he changes his mind.

## 2022-01-23 NOTE — TOC Progression Note (Signed)
Transition of Care [facility]) - Progression Note    Patient Details  Name: Taylor [name] MRN: [id number] Date of Birth: 09/07/1955  [facility] CM/SW Contact  Kermit Balo, RN Phone Number: 01/23/2022, 3:31 PM  Clinical Narrative:    Pt states his brother and sister can check on him. CM found that [facility] is not able to accept for home health. CM has asked [facility] to see if they can see the patient.  TOC following.   Expected Discharge Plan: Home w Home Health Services Barriers to Discharge: Continued Medical Work up  Expected Discharge Plan and Services Expected Discharge Plan: Home w Home Health Services   Discharge Planning Services: CM Consult Post Acute Care Choice: Home Health Living arrangements for the past 2 months: Single Family Home                           HH Arranged: PT, OT HH Agency: [facility] Date [facility] Agency Contacted: 01/21/22 Time HH Agency Contacted: 1449 Representative spoke with at [facility] Agency: Tresa Endo   Social Determinants of Health (SDOH) Interventions    Readmission Risk Interventions     No data to display

## 2022-01-23 NOTE — Progress Notes (Signed)
Physical Therapy Treatment Patient Details Name: Taylor [name] MRN: [id number] DOB: Aug 03, 1955 Today's Date: 01/23/2022   History of Present Illness 65 year old who presented with increased lethargy and found with AKI requiring dialysis; Started CRRT 8/9 and held CRRT 8/11, monitoring for renal recovery; intubated 8/9- 8/12. 8/13 developed afib.  PMH includes: HTN, chronic systolic heart failure, atrial fibrillation, DM2 and chronic pain medications on home dilaudid, oxycontin, lyrica, ambien    PT Comments    Pt eager to progress mobility today, pt's brother present for part of session and very encouraging. Pt ambulatory for multiple short bouts in room today, maintaining SPO2 >88% on RA. Pt overall requiring min assist for transfers and gait. Pt making slow but steady progress, will continue to follow.     Recommendations for follow up therapy are one component of a multi-disciplinary discharge planning process, led by the attending physician.  Recommendations may be updated based on patient status, additional functional criteria and insurance authorization.  Follow Up Recommendations  Home health PT     Assistance Recommended at Discharge Frequent or constant Supervision/Assistance  Patient can return home with the following A little help with walking and/or transfers;A little help with bathing/dressing/bathroom;Assistance with cooking/housework;Assist for transportation   Equipment Recommendations  Rolling walker (2 wheels);BSC/3in1    Recommendations for Other Services       Precautions / Restrictions Precautions Precautions: Fall Restrictions Weight Bearing Restrictions: No     Mobility  Bed Mobility Overal bed mobility: Needs Assistance             General bed mobility comments: sitting EOB    Transfers Overall transfer level: Needs assistance Equipment used: Rolling walker (2 wheels) Transfers: Sit to/from Stand Sit to Stand: Min assist            General transfer comment: light rise and steady assist, STS x3 from EOB x2 and chair x1    Ambulation/Gait Ambulation/Gait assistance: Min assist Gait Distance (Feet): 15 Feet (+7+8) Assistive device: Rolling walker (2 wheels) Gait Pattern/deviations: Step-through pattern, Decreased stride length, Trunk flexed Gait velocity: decr     General Gait Details: cues for upright posture, min assist to steady and guide RW. seated rest break x2   Stairs             Wheelchair Mobility    Modified Rankin (Stroke Patients Only)       Balance Overall balance assessment: Needs assistance Sitting-balance support: No upper extremity supported, Feet supported Sitting balance-Leahy Scale: Fair Sitting balance - Comments: able to sit on EOB without assistance   Standing balance support: Bilateral upper extremity supported, During functional activity Standing balance-Leahy Scale: Poor Standing balance comment: reliant on external support                            Cognition Arousal/Alertness: Awake/alert Behavior During Therapy: WFL for tasks assessed/performed Overall Cognitive Status: Impaired/Different from baseline Area of Impairment: Attention, Following commands, Safety/judgement, Problem solving                   Current Attention Level: Selective   Following Commands: Follows one step commands with increased time Safety/Judgement: Decreased awareness of safety   Problem Solving: Slow processing, Requires verbal cues, Decreased initiation, Difficulty sequencing General Comments: pt more aware of deficits and need to take rest breaks today        Exercises      General Comments General comments (skin integrity,  edema, etc.): SPO2 88% and above on RA during mobility, left off O2 at end of session as pt satting in the 90s, RN aware      Pertinent Vitals/Pain Pain Assessment Pain Assessment: Faces Faces Pain Scale: Hurts little more Pain  Location: back Pain Descriptors / Indicators: Sore, Aching Pain Intervention(s): Limited activity within patient's tolerance, Monitored during session, Repositioned    Home Living                          Prior Function            PT Goals (current goals can now be found in the care plan section) Acute Rehab PT Goals PT Goal Formulation: With patient Time For Goal Achievement: 02/01/22 Potential to Achieve Goals: Good Progress towards PT goals: Progressing toward goals    Frequency    Min 3X/week      PT Plan Current plan remains appropriate    Co-evaluation              AM-PAC PT "6 Clicks" Mobility   Outcome Measure  Help needed turning from your back to your side while in a flat bed without using bedrails?: A Little Help needed moving from lying on your back to sitting on the side of a flat bed without using bedrails?: A Little Help needed moving to and from a bed to a chair (including a wheelchair)?: A Little Help needed standing up from a chair using your arms (e.g., wheelchair or bedside chair)?: A Little Help needed to walk in hospital room?: A Little Help needed climbing 3-5 steps with a railing? : A Lot 6 Click Score: 17    End of Session   Activity Tolerance: Patient tolerated treatment well;Patient limited by fatigue Patient left: in chair;with call bell/phone within reach;with chair alarm set;with family/visitor present Nurse Communication: Mobility status PT Visit Diagnosis: Other abnormalities of gait and mobility (R26.89);Difficulty in walking, not elsewhere classified (R26.2)     Time: 1030-1050 PT Time Calculation (min) (ACUTE ONLY): 20 min  Charges:  $Therapeutic Activity: 8-22 mins                     Marye Round, PT DPT Acute Rehabilitation Services Pager [phone]  Office [phone]    [name] 01/23/2022, 1:07 PM

## 2022-01-23 NOTE — Progress Notes (Addendum)
Progress Note  Patient Name: Taylor [name] Date of Encounter: 01/23/2022  [facility] Cardiologist: Debbe Odea, MD   Subjective   He has limited insight. He denied any chest pain. He reports baseline inactivity for many years. He denied worsening of SOB, states he was told to start void trial soon.   Inpatient Medications    Scheduled Meds:  amiodarone  200 mg Oral BID   apixaban  5 mg Oral BID   Chlorhexidine Gluconate Cloth  6 each Topical Q0600   docusate sodium  100 mg Oral BID   fiber supplement (BANATROL TF)  60 mL Oral BID   furosemide  40 mg Intravenous BID   insulin aspart  0-20 Units Subcutaneous Q4H   insulin aspart  3 Units Subcutaneous TID WC   insulin glargine-yfgn  14 Units Subcutaneous QHS   lidocaine  1 patch Transdermal Q24H   melatonin  3 mg Oral QHS   metoprolol tartrate  200 mg Oral BID   multivitamin with minerals  1 tablet Oral Daily   OLANZapine  7.5 mg Oral QHS   oxyCODONE  10 mg Oral Q6H   polyethylene glycol  17 g Oral Daily   potassium chloride  40 mEq Oral BID   senna  1 tablet Oral Daily   tamsulosin  0.4 mg Oral Daily   traZODone  100 mg Oral QHS   zolpidem  5 mg Oral QHS   Continuous Infusions:  sodium chloride 250 mL (01/19/22 1033)   PRN Meds: HYDROmorphone, ipratropium-albuterol, lip balm, ondansetron (ZOFRAN) IV, mouth rinse, traZODone   Vital Signs    Vitals:   01/23/22 0605 01/23/22 0900 01/23/22 1225 01/23/22 1315  BP: 133/87 (!) 130/100 118/72 117/88  Pulse: 90 (!) 103 90 79  Resp: 17  18   Temp: 97.9 F (36.6 C)     TempSrc: Oral  Oral   SpO2: 97% 99% 91% 94%  Weight:      Height:        Intake/Output Summary (Last 24 hours) at 01/23/2022 1345 Last data filed at 01/23/2022 0600 Gross per 24 hour  Intake --  Output 1750 ml  Net -1750 ml      01/23/2022    4:39 AM 01/22/2022    5:00 AM 01/19/2022    5:00 AM  Last 3 Weights  Weight (lbs) 351 lb 6.6 oz 354 lb 8 oz 347 lb 10.7 oz  Weight (kg) 159.4  kg 160.8 kg 157.7 kg      Telemetry    Afib 70-90s, PVCs - Personally Reviewed  ECG    No tracing this morning  Physical Exam   GEN: Obese older male, on room air  Neck: JVD to mid neck with HOB 45 degree Cardiac: Irreg Irreg, no murmurs, rubs, or gallops.  Respiratory: Clear to auscultation bilaterally. On room air.  GI: Soft, nontender, non-distended  MS: 1- LE edema; No deformity. Neuro:  Nonfocal  Psych: Normal affect   Labs    High Sensitivity Troponin:   Recent Labs  Lab 01/13/22 2331 01/14/22 0109  TROPONINIHS 20* 15     Chemistry Recent Labs  Lab 01/16/22 1431 01/17/22 0520 01/18/22 0504 01/18/22 2334 01/21/22 0425 01/22/22 0500 01/23/22 0349  NA 136   < > 140   < > 141 142 142  K 4.1   < > 3.9   < > 4.2 3.4* 3.5  CL 102   < > 104   < > 103 101 105  CO2 23   < > 23   < > 24 27 27   GLUCOSE 178*   < > 174*   < > 190* 140* 180*  BUN 29*   < > 37*   < > 51* 46* 35*  CREATININE 3.58*   < > 2.70*   < > 2.06* 1.69* 1.55*  CALCIUM 8.0*   < > 8.7*   < > 9.0 9.3 8.7*  MG  --    < >  --    < > 2.2 2.0 1.8  ALBUMIN 2.4*  --  2.6*  --   --   --   --   GFRNONAA 18*   < > 26*   < > 35* 45* 50*  ANIONGAP 11   < > 13   < > 14 14 10    < > = values in this interval not displayed.    Lipids  No results for input(s): "CHOL", "TRIG", "HDL", "LABVLDL", "LDLCALC", "CHOLHDL" in the last 168 hours.   Hematology Recent Labs  Lab 01/21/22 0425 01/22/22 0500 01/23/22 0349  WBC 11.2* 10.0 8.9  RBC 5.08 5.41 4.52  HGB 14.6 15.2 12.9*  HCT 44.4 47.9 40.7  MCV 87.4 88.5 90.0  MCH 28.7 28.1 28.5  MCHC 32.9 31.7 31.7  RDW 16.3* 16.8* 16.2*  PLT 117* 153 137*   Thyroid No results for input(s): "TSH", "FREET4" in the last 168 hours.  BNP Recent Labs  Lab 01/20/22 0508  BNP 1,077.6*    DDimer No results for input(s): "DDIMER" in the last 168 hours.   Radiology    No results found.  Cardiac Studies   Echo: 01/14/22   1. Technically difficult study, very  limited views even with contrast  administration   2. Left ventricular ejection fraction, by estimation, is grossly 40 to  45%. The left ventricle has mildly decreased function. Left ventricular  endocardial border not optimally defined to evaluate regional wall motion.  Left ventricular diastolic  parameters are indeterminate.   3. Right ventricule is not well visualized but grossly normal size and  systolic function. There is normal pulmonary artery systolic pressure.   4. The mitral valve is grossly normal. Trivial mitral valve  regurgitation.   5. The aortic valve was not well visualized. Aortic valve regurgitation  is not visualized. No aortic stenosis is present.   6. The inferior vena cava is dilated in size with <50% respiratory  variability, suggesting right atrial pressure of 15 mmHg.   FINDINGS   Left Ventricle: Left ventricular ejection fraction, by estimation, is 40  to 45%. The left ventricle has mildly decreased function. Left ventricular  endocardial border not optimally defined to evaluate regional wall motion.  Definity contrast agent was  given IV to delineate the left ventricular endocardial borders. The left  ventricular internal cavity size was normal in size. There is no left  ventricular hypertrophy. Left ventricular diastolic parameters are  indeterminate.   Right Ventricle: The right ventricular size is not well visualized. Right  vetricular wall thickness was not well visualized. Right ventricular  systolic function was not well visualized. There is normal pulmonary  artery systolic pressure. The tricuspid  regurgitant velocity is 2.14 m/s, and with an assumed right atrial  pressure of 15 mmHg, the estimated right ventricular systolic pressure is  33.3 mmHg.   Left Atrium: Left atrial size was not well visualized.   Right Atrium: Right atrial size was not well visualized.   Pericardium: Trivial pericardial effusion  is present.   Mitral Valve: The  mitral valve is grossly normal. Trivial mitral valve  regurgitation.   Tricuspid Valve: The tricuspid valve is normal in structure. Tricuspid  valve regurgitation is trivial.   Aortic Valve: The aortic valve was not well visualized. Aortic valve  regurgitation is not visualized. No aortic stenosis is present.   Pulmonic Valve: The pulmonic valve was not well visualized. Pulmonic valve  regurgitation is not visualized.   Aorta: The aortic root is normal in size and structure.   Venous: The inferior vena cava is dilated in size with less than 50%  respiratory variability, suggesting right atrial pressure of 15 mmHg.   IAS/Shunts: The interatrial septum was not well visualized.   Patient Profile     66 y.o. male  with a hx of HTN CAD (60% mLAD, 30% D2), diabetes, permanent atrial fibrillation s/p failed DCCV, NICM last EF 35-40%, OSA, morbid obesity who is being seen 01/20/2022 for the evaluation of atrial fib at the request of Dr. Jarvis Newcomer.  Assessment & Plan    Permanent Afib: rate is controlled on high dose metoprolol 200mg  BID and amiodarone 200mg  BID. Will continue for now for rate control. Wean off amiodarone as outpatient when able. continue Eliquis 5mg  BID. Keep Mag >2 and K >4.   Acute on chronic systolic heart failure: LVEF 40-45%, normal RV size and function. Normal pulmonary artery systolic pressure. Trival MR. Net - 10L, weight down to 351 from 381 since admission, Cr improving. Volume status improving, continue IV lasix 40mg  BID until Cr rising   AKI due to ATN - s/p CRRT 8/9-8/11 -- Cr peaked at 10, slowing improving. Now down to 1.55 today - per primary team   HTN --Controlled on metoprolol 200mg  BID  For questions or updates, please contact [facility] Please consult [email or web address] for contact info under   Signed, , NP  01/23/2022, 1:45 PM    Patient seen, examined. Available data reviewed. Agree with findings, assessment, and plan as outlined by , NP. The patient is independently interviewed and examined. He is alert, oriented, in NAD. Sitting on the bedside eating lunch. JVP is normal. Lungs are clear. CV: irregular no murmur. Abd: soft, obese, NT. Extremities: trace pretibial edema. Tele reviewed and shows improved HR now in the 90's. Volume status is much better. I would convert him to oral torsemide tomorrow. Stop amiodarone and continue metoprolol 200 mg BID. Pt anticoagulated with apixaban and appears to be tolerating this well. No other cardiac recommendations at this time. Will write orders for transition to oral diuretics and discontinuation of amiodarone. Will sign off today and arrange OP follow-up. Please call if any questions. thx  09-18-1967, M.D. 01/23/2022 2:52 PM

## 2022-01-23 NOTE — Progress Notes (Signed)
Progress Note  Patient: Taylor [name] FUX:[id number] DOB: 02/15/1956  DOA: 01/13/2022  DOS: 01/23/2022    Brief hospital course: ARCH METHOT is a 66 y.o. male with a history of NICM, chronic HFrEF (LVEF 35-40%), CAD, permanent atrial fibrillation s/p failed DCCV, HTN, HLD, T2DM, morbid obesity, and chronic back pain on oxycodone, dilaudid, lyrica who presented to the ED 8/8 with lethargy, nausea. He was encephalopathic with leg edema, in renal failure with creatinine of 9.94 from baseline of 1.04, hypotensive requiring pressors. He was transferred from [facility] ICU and underwent CRRT 8/9 - 8/11, extubated 8/12. Neurology was involved for concern of generalized tonic-clonic seizure which was treated with depakote. EEG and MRI were negative, so this was stopped. Lyrica has been discontinued as well.   Hospitalization complicated by acute urinary retention, RVR and pulmonary edema, though this is improving and he is felt stable for transfer to medical service to pick up 8/4.   Assessment and Plan: Acute hypoxic respiratory failure: Pt states he's been told he needed oxygen in the past but never pursued it.  - Wean oxygen as tolerated with diuresis as below.  - OOB, IS encouraged.   Acute on chronic HFrEF: With pulmonary edema, likely related to rapid atrial fibrillation. - Continue lasix 40mg  IV BID. He's maintaining net negative balance with improving renal function.  - Continue to monitor I/O, weights.  - Home torsemide was 40mg  qAM, 20mg  qPM.  - Holding SGLT2i, ACEi in setting of renal failure  Permanent atrial fibrillation with RVR: s/p failed DCCV March and Sept 2022.  - Continue metoprolol 200mg , converted IV amiodarone to PO which is likely to be continued for now, but not indefinite. - Rate control significantly improved on this.  - Cardiology consulted for further recommendations - Continue DOAC - Said not to be an ablation candidate due to morbid obesity.  - Supp K to keep at  goal 4. Mg at goal =2. Note TSH 3.762.  Hypokalemia: Supplement again today and monitor.  Acute toxic and metabolic encephalopathy: Due to polypharmacy in the setting of renal failure and uremia. - Continue delirium precautions, optimizing metabolic parameters, holding oxycontin, and reduced ambien to 5mg  and dilaudid dose to 2mg . - Started qHS zyprexa, will continue. Add melatonin tonight.  - Monitor renal function closely now that we're pulling the foley.  Acute renal failure due to ATN: Resolved, s/p CRRT 8/9 - 8/11 with subsequent good UOP and Cr declining.  - Monitor UOP, Cr.  - Creatinine baseline unclear, continues improvement again today with diuresis.   Deconditioning: Pt urged to get OOB. Will continue PT/OT and anticipate improvement back near functional baseline with improved volume status, renal status, and mental status.  Morbid obesity: Body mass index is 46.36 kg/m.  - Pt's PCP has discussed ozempic, will plan to trial (has had rybelsus intolerance (nausea)) after discharge.  Acute urinary retention:  - To facilitate mobility efforts, we will perform voiding trial today. - Continue new tamsulosin   Chronic normocytic anemia:  - Mild, monitor while on anticoagulation. Normalized.  Thrombocytopenia: Unclear etiology, resolved.  HTN: Stable  HLD:  - Holding statin for now  T2DM:  - Started glargine, increase to 14u qHS, continue SSI, add mealtime coverage 3u. At inpatient goal currently. - Holding home meds with renal impairment.   Leukocytosis: Noted. Also had diarrhea though this is resolved and abd exam benign.  - Resolved without antimicrobial Tx   Subjective: He didn't sleep at all last night again,  but is in better spirits. Feels his breathing is a bit better, amenable to getting up OOB, wants to pursue voiding trial. Having small loose stools that are overall less than 3/day.   Objective: Vitals:   01/22/22 1542 01/22/22 2010 01/23/22 0439 01/23/22 0605   BP: 104/82 122/83  133/87  Pulse: 78 98  90  Resp: 18 19  17   Temp: 98 F (36.7 C) 98.1 F (36.7 C)  97.9 F (36.6 C)  TempSrc: Oral Oral  Oral  SpO2: 97% 96%  97%  Weight:   (!) 159.4 kg   Height:       Gen: 66 y.o. male in no distress, obese Pulm: Nonlabored breathing supplemental oxygen. Crackles at bases are decreased, improve with repeated inspiration efforts. CV: Regular rate and rhythm. No murmur, rub, or gallop. No JVD, no dependent edema. GI: Abdomen soft, non-tender, non-distended, with normoactive bowel sounds.  Ext: Warm, no deformities, 2+ dependent leg edema when sitting upright with legs dangling off EOB.  Skin: No new rashes, lesions or ulcers on visualized skin. Former IV site RUA is now c/d/i Neuro: Alert and oriented. No focal neurological deficits. Psych: Judgement and insight appear intact. Mood euthymic & affect congruent. Behavior is appropriate.     Data Personally reviewed: CBC: Recent Labs  Lab 01/19/22 0712 01/20/22 0508 01/21/22 0425 01/22/22 0500 01/23/22 0349  WBC 8.6 11.7* 11.2* 10.0 8.9  HGB 12.8* 13.3 14.6 15.2 12.9*  HCT 39.3 41.1 44.4 47.9 40.7  MCV 86.9 87.8 87.4 88.5 90.0  PLT 143* 176 117* 153 137*   Basic Metabolic Panel: Recent Labs  Lab 01/16/22 1431 01/17/22 0520 01/18/22 0504 01/18/22 2334 01/19/22 0712 01/20/22 0508 01/21/22 0425 01/22/22 0500 01/23/22 0349  NA 136   < > 140 138 140 140 141 142 142  K 4.1   < > 3.9 4.4 3.9 4.2 4.2 3.4* 3.5  CL 102   < > 104 105 104 104 103 101 105  CO2 23   < > 23 20* 24 23 24 27 27   GLUCOSE 178*   < > 174* 221* 183* 211* 190* 140* 180*  BUN 29*   < > 37* 41* 42* 47* 51* 46* 35*  CREATININE 3.58*   < > 2.70* 2.38* 2.31* 2.08* 2.06* 1.69* 1.55*  CALCIUM 8.0*   < > 8.7* 9.2 8.9 9.0 9.0 9.3 8.7*  MG  --    < >  --  2.3  --  2.2 2.2 2.0 1.8  PHOS 3.3  --  4.4  --   --   --   --   --   --    < > = values in this interval not displayed.   GFR: Estimated Creatinine Clearance: 76.1  mL/min (A) (by C-G formula based on SCr of 1.55 mg/dL (H)). Liver Function Tests: Recent Labs  Lab 01/16/22 1431 01/18/22 0504  ALBUMIN 2.4* 2.6*   Urine analysis:    Component Value Date/Time   COLORURINE YELLOW 01/14/2022 0631   APPEARANCEUR HAZY (A) 01/14/2022 0631   LABSPEC 1.016 01/14/2022 0631   PHURINE 5.0 01/14/2022 0631   GLUCOSEU 50 (A) 01/14/2022 0631   HGBUR MODERATE (A) 01/14/2022 0631   BILIRUBINUR NEGATIVE 01/14/2022 0631   KETONESUR NEGATIVE 01/14/2022 0631   PROTEINUR 100 (A) 01/14/2022 0631   NITRITE NEGATIVE 01/14/2022 0631   LEUKOCYTESUR NEGATIVE 01/14/2022 0631   Recent Results (from the past 240 hour(s))  Blood culture (routine x 2)     Status: None  Collection Time: 01/13/22 11:31 PM   Specimen: BLOOD RIGHT FOREARM  Result Value Ref Range Status   Specimen Description BLOOD RIGHT FOREARM  Final   Special Requests   Final    BOTTLES DRAWN AEROBIC AND ANAEROBIC Blood Culture adequate volume   Culture   Final    NO GROWTH 5 DAYS Performed at [facility], [address] [place], [place] [address]    Report Status 01/18/2022 FINAL  Final  Blood culture (routine x 2)     Status: None   Collection Time: 01/14/22 12:15 AM   Specimen: Left Antecubital; Blood  Result Value Ref Range Status   Specimen Description LEFT ANTECUBITAL  Final   Special Requests   Final    BOTTLES DRAWN AEROBIC AND ANAEROBIC Blood Culture adequate volume   Culture   Final    NO GROWTH 5 DAYS Performed at [facility], [address], [place], [place] [address]    Report Status 01/19/2022 FINAL  Final  SARS Coronavirus 2 by RT PCR (hospital order, performed in [facility] hospital lab) *cepheid single result test* Anterior Nasal Swab     Status: None   Collection Time: 01/14/22  5:21 AM   Specimen: Anterior Nasal Swab  Result Value Ref Range Status   SARS Coronavirus 2 by RT PCR NEGATIVE NEGATIVE Final    Comment: (NOTE) SARS-CoV-2 target nucleic acids are NOT  DETECTED.  The SARS-CoV-2 RNA is generally detectable in upper and lower respiratory specimens during the acute phase of infection. The lowest concentration of SARS-CoV-2 viral copies this assay can detect is 250 copies / mL. A negative result does not preclude SARS-CoV-2 infection and should not be used as the sole basis for treatment or other patient management decisions.  A negative result may occur with improper specimen collection / handling, submission of specimen other than nasopharyngeal swab, presence of viral mutation(s) within the areas targeted by this assay, and inadequate number of viral copies (<250 copies / mL). A negative result must be combined with clinical observations, patient history, and epidemiological information.  Fact Sheet for Patients:   https://www.patel.info/  Fact Sheet for Healthcare Providers: https://hall.com/  This test is not yet approved or  cleared by the [place] FDA and has been authorized for detection and/or diagnosis of SARS-CoV-2 by FDA under an Emergency Use Authorization (EUA).  This EUA will remain in effect (meaning this test can be used) for the duration of the COVID-19 declaration under Section 564(b)(1) of the Act, 21 U.S.C. section 360bbb-3(b)(1), unless the authorization is terminated or revoked sooner.  Performed at [facility], [place] [address], [place], [place] [address]   MRSA Next Gen by PCR, Nasal     Status: None   Collection Time: 01/14/22  6:30 AM   Specimen: Nasal Mucosa; Nasal Swab  Result Value Ref Range Status   MRSA by PCR Next Gen NOT DETECTED NOT DETECTED Final    Comment: (NOTE) The GeneXpert MRSA Assay (FDA approved for NASAL specimens only), is one component of a comprehensive MRSA colonization surveillance program. It is not intended to diagnose MRSA infection nor to guide or monitor treatment for MRSA infections. Test performance is not FDA approved  in patients less than 64 years old. Performed at [facility], [place] [address] [place], [place] [address]   Culture, Respiratory w Gram Stain     Status: None   Collection Time: 01/14/22  5:02 PM   Specimen: Tracheal Aspirate; Respiratory  Result Value  Ref Range Status   Specimen Description TRACHEAL ASPIRATE  Final   Special Requests NONE  Final   Gram Stain   Final    NO ORGANISMS SEEN MODERATE WBC PRESENT, PREDOMINANTLY PMN    Culture   Final    Normal respiratory flora-no Staph aureus or Pseudomonas seen Performed at [facility], [address] [place], [place] [address]    Report Status 01/17/2022 FINAL  Final     Family Communication: None at bedside.  Disposition: Status is: Inpatient Remains inpatient appropriate because: Severity of illness, IV diuresis  Planned Discharge Destination: Home with home health currently recommended though patient is growing more deconditioned. PT to reevaluate today.  Patrecia Pour, MD 01/23/2022 11:35 AM Page by [email or web address]

## 2022-01-24 DIAGNOSIS — N179 Acute kidney failure, unspecified: Secondary | ICD-10-CM | POA: Diagnosis not present

## 2022-01-24 DIAGNOSIS — I4821 Permanent atrial fibrillation: Secondary | ICD-10-CM | POA: Diagnosis not present

## 2022-01-24 DIAGNOSIS — G9340 Encephalopathy, unspecified: Secondary | ICD-10-CM | POA: Diagnosis not present

## 2022-01-24 DIAGNOSIS — I5023 Acute on chronic systolic (congestive) heart failure: Secondary | ICD-10-CM | POA: Diagnosis not present

## 2022-01-24 DIAGNOSIS — J9602 Acute respiratory failure with hypercapnia: Secondary | ICD-10-CM | POA: Diagnosis not present

## 2022-01-24 DIAGNOSIS — I959 Hypotension, unspecified: Secondary | ICD-10-CM | POA: Diagnosis not present

## 2022-01-24 LAB — BASIC METABOLIC PANEL
Anion gap: 12 (ref 5–15)
BUN: 26 mg/dL — ABNORMAL HIGH (ref 8–23)
CO2: 29 mmol/L (ref 22–32)
Calcium: 8.9 mg/dL (ref 8.9–10.3)
Chloride: 103 mmol/L (ref 98–111)
Creatinine, Ser: 1.33 mg/dL — ABNORMAL HIGH (ref 0.61–1.24)
GFR, Estimated: 60 mL/min — ABNORMAL LOW (ref >60)
Glucose, Bld: 121 mg/dL — ABNORMAL HIGH (ref 70–99)
Potassium: 3.2 mmol/L — ABNORMAL LOW (ref 3.5–5.1)
Sodium: 144 mmol/L (ref 135–145)

## 2022-01-24 LAB — GLUCOSE, CAPILLARY
Glucose-Capillary: 120 mg/dL — ABNORMAL HIGH (ref 70–99)
Glucose-Capillary: 151 mg/dL — ABNORMAL HIGH (ref 70–99)
Glucose-Capillary: 204 mg/dL — ABNORMAL HIGH (ref 70–99)
Glucose-Capillary: 167 mg/dL — ABNORMAL HIGH (ref 70–99)
Glucose-Capillary: 216 mg/dL — ABNORMAL HIGH (ref 70–99)
Glucose-Capillary: 231 mg/dL — ABNORMAL HIGH (ref 70–99)

## 2022-01-24 LAB — MAGNESIUM: Magnesium: 1.9 mg/dL (ref 1.7–2.4)

## 2022-01-24 MED ORDER — POTASSIUM CHLORIDE CRYS ER 20 MEQ PO TBCR
40.0000 meq | EXTENDED_RELEASE_TABLET | Freq: Three times a day (TID) | ORAL | Status: AC
  Administered 2022-01-24 (×3): 40 meq via ORAL
  Filled 2022-01-24 (×3): qty 2

## 2022-01-24 MED ORDER — MAGNESIUM SULFATE IN D5W 1-5 GM/100ML-% IV SOLN
1.0000 g | Freq: Once | INTRAVENOUS | Status: AC
  Administered 2022-01-24: 1 g via INTRAVENOUS
  Filled 2022-01-24: qty 100

## 2022-01-24 MED ORDER — FUROSEMIDE 10 MG/ML IJ SOLN
40.0000 mg | Freq: Two times a day (BID) | INTRAMUSCULAR | Status: DC
  Administered 2022-01-24 – 2022-01-25 (×3): 40 mg via INTRAVENOUS
  Filled 2022-01-24 (×3): qty 4

## 2022-01-24 NOTE — Progress Notes (Signed)
Progress Note  Patient: Taylor [name]:[id number] DOB: [birth year]  DOA: 01/13/2022  DOS: 01/24/2022    Brief hospital course: Taylor [name] is a 66 y.o. male with a history of NICM, chronic HFrEF (LVEF 35-40%), CAD, permanent atrial fibrillation s/p failed DCCV, HTN, HLD, T2DM, morbid obesity, and chronic back pain on oxycodone, dilaudid, lyrica who presented to the ED 8/8 with lethargy, nausea. He was encephalopathic with leg edema, in renal failure with creatinine of 9.94 from baseline of 1.04, hypotensive requiring pressors. He was transferred from [facility] ICU and underwent CRRT 8/9 - 8/11, extubated 8/12. Neurology was involved for concern of generalized tonic-clonic seizure which was treated with depakote. EEG and MRI were negative, so this was stopped. Lyrica has been discontinued as well.   Hospitalization complicated by acute urinary retention, RVR and pulmonary edema, though this is improving and he is felt stable for transfer to medical service to pick up 8/4.   Assessment and Plan: Acute hypoxic respiratory failure: Pt states he's been told he needed oxygen in the past but never pursued it.  - Wean oxygen as tolerated with diuresis as below.  - OOB, IS encouraged.   Acute on chronic HFrEF: With pulmonary edema, likely related to rapid atrial fibrillation. - Continue lasix 40mg  IV BID again today. Weight continues downward trend and though he has some chronic swelling, he states it's not back to that baseline, still peripherally overloaded. Weight is down to 157kg (PTA baseline thought to have been 169-175kg based on outpatient records), so will seek new EDW. He's maintaining net negative balance with improving renal function.  - Continue to monitor I/O, weights.  - Home torsemide was 40mg  qAM, 20mg  qPM. Will likely return to that at discharge. - Holding SGLT2i, ACEi in setting of renal failure  Permanent atrial fibrillation with RVR: s/p failed DCCV March and Sept 2022.  -  Continue metoprolol 200mg , converted IV amiodarone to PO which cardiology has recommended discontinuing. Rate up a bit this AM but unclear whether that is signal or noise, will monitor closely today.  - Cardiology consult appreciated. Will need outpatient follow up at discharge.  - Continue DOAC - Said not to be an ablation candidate due to morbid obesity.  - Supp K to keep at goal 4. Mg at goal =2. Note TSH 3.762.  Hypokalemia: Supplement TID today (down to 3.2 despite total yesterday), supp Mg 1g.   Acute toxic and metabolic encephalopathy: Due to polypharmacy in the setting of renal failure and uremia. - Continue delirium precautions, optimizing metabolic parameters, holding oxycontin, and reduced ambien to 5mg  and dilaudid dose to 2mg . - Started qHS zyprexa, will continue. Add melatonin tonight.  - Monitor renal function closely now that we're pulling the foley.  Acute renal failure due to ATN: Resolved, s/p CRRT 8/9 - 8/11 with subsequent good UOP and Cr declining.  - Monitor UOP, Cr.  - Creatinine baseline unclear, continues improvement again today with diuresis. CrCl now >45ml/min.    Deconditioning: Pt urged to get OOB. Will continue PT/OT and anticipate improvement back near functional baseline with improved volume status, renal status, and mental status.  Morbid obesity: Body mass index is 45.9 kg/m.  - Pt's PCP has discussed ozempic, will plan to trial (has had rybelsus intolerance (nausea)) after discharge.  Acute urinary retention:  - To facilitate mobility efforts, DC'ed foley 8/18, has had void. Check bladder scan to r/o retention.  - Continue new tamsulosin   Chronic normocytic anemia:  -  Mild, monitor while on anticoagulation. Normalized.  Thrombocytopenia: Unclear etiology, resolved.  HTN: Stable  HLD:  - Holding statin for now  T2DM:  - Started glargine, increase to 14u qHS, continue SSI, add mealtime coverage 3u. At inpatient goal currently. - Holding  home meds with renal impairment.   Leukocytosis: Noted. Also had diarrhea though this is resolved and abd exam benign.  - Resolved without antimicrobial Tx   Subjective: Did sleep last night, was able to urinate this morning, with some dysuria. No hematuria. No abd pain. Got OOB yesterday, not so far today. Eager to get home. Still short of breath with exertion with leg swelling (somewhat chronic but still worse than baseline)  Objective: Vitals:   01/23/22 1952 01/23/22 2313 01/24/22 0314 01/24/22 0328  BP: 126/70 131/75 (!) 142/89   Pulse: 64 65 64   Resp: 18 20 20    Temp: 97.8 F (36.6 C) 98.7 F (37.1 C) 98.7 F (37.1 C)   TempSrc: Oral Oral Oral   SpO2: 99% 90% 91%   Weight:    (!) 157.8 kg  Height:       Gen: Obese, chronically ill-appearing, very pleasant male in no distress Pulm: Nonlabored breathing, diminished at bases without wheezes or crackles. CV: Irreg irreg, rate in 100's, no MRG. Stable 1+ pitting bipedal edema GI: Abdomen soft, non-tender, non-distended, with normoactive bowel sounds.  Ext: Warm, no deformities Skin: No rashes, lesions or ulcers on visualized skin. Neuro: Alert and oriented. No focal neurological deficits. Psych: Judgement and insight appear fair. Mood euthymic & affect congruent. Behavior is appropriate.     Data Personally reviewed: CBC: Recent Labs  Lab 01/19/22 0712 01/20/22 0508 01/21/22 0425 01/22/22 0500 01/23/22 0349  WBC 8.6 11.7* 11.2* 10.0 8.9  HGB 12.8* 13.3 14.6 15.2 12.9*  HCT 39.3 41.1 44.4 47.9 40.7  MCV 86.9 87.8 87.4 88.5 90.0  PLT 143* 176 117* 153 137*   Basic Metabolic Panel: Recent Labs  Lab 01/18/22 0504 01/18/22 2334 01/20/22 0508 01/21/22 0425 01/22/22 0500 01/23/22 0349 01/24/22 0231  NA 140   < > 140 141 142 142 144  K 3.9   < > 4.2 4.2 3.4* 3.5 3.2*  CL 104   < > 104 103 101 105 103  CO2 23   < > 23 24 27 27 29   GLUCOSE 174*   < > 211* 190* 140* 180* 121*  BUN 37*   < > 47* 51* 46* 35* 26*   CREATININE 2.70*   < > 2.08* 2.06* 1.69* 1.55* 1.33*  CALCIUM 8.7*   < > 9.0 9.0 9.3 8.7* 8.9  MG  --    < > 2.2 2.2 2.0 1.8 1.9  PHOS 4.4  --   --   --   --   --   --    < > = values in this interval not displayed.   GFR: Estimated Creatinine Clearance: 88.2 mL/min (A) (by C-G formula based on SCr of 1.33 mg/dL (H)). Liver Function Tests: Recent Labs  Lab 01/18/22 0504  ALBUMIN 2.6*   Urine analysis:    Component Value Date/Time   COLORURINE YELLOW 01/14/2022 0631   APPEARANCEUR HAZY (A) 01/14/2022 0631   LABSPEC 1.016 01/14/2022 0631   PHURINE 5.0 01/14/2022 0631   GLUCOSEU 50 (A) 01/14/2022 0631   HGBUR MODERATE (A) 01/14/2022 0631   BILIRUBINUR NEGATIVE 01/14/2022 0631   KETONESUR NEGATIVE 01/14/2022 0631   PROTEINUR 100 (A) 01/14/2022 0631   NITRITE NEGATIVE 01/14/2022 0631  LEUKOCYTESUR NEGATIVE 01/14/2022 0631   Recent Results (from the past 240 hour(s))  Culture, Respiratory w Gram Stain     Status: None   Collection Time: 01/14/22  5:02 PM   Specimen: Tracheal Aspirate; Respiratory  Result Value Ref Range Status   Specimen Description TRACHEAL ASPIRATE  Final   Special Requests NONE  Final   Gram Stain   Final    NO ORGANISMS SEEN MODERATE WBC PRESENT, PREDOMINANTLY PMN    Culture   Final    Normal respiratory flora-no Staph aureus or Pseudomonas seen Performed at [facility], [address] [place], [place] [address]    Report Status 01/17/2022 FINAL  Final     Family Communication: None at bedside.  Disposition: Status is: Inpatient Remains inpatient appropriate because: Severity of illness, IV diuresis continues Planned Discharge Destination: Home with home health if functional mobility continues improving. If respiratory failure resolves, anticipate DC 8/21.   Patrecia Pour, MD 01/24/2022 9:28 AM Page by [email or web address]

## 2022-01-24 NOTE — Progress Notes (Signed)
Patient has not voided since foley removed 8-18 @ 1830. Bladder scan shows 65ml. Patient sat on side of bed and attempted to void but was unsuccessful. MD notified. Will monitor

## 2022-01-24 NOTE — Progress Notes (Signed)
Progress Note  Patient Name: Taylor [name] Date of Encounter: 01/24/2022  [facility] Cardiologist: Debbe Odea, MD   Subjective   Incomplete I/Os.  Cr continues to improve (2.1 >1.7 > 1.6 > 1.3). Dyspnea improved.  Inpatient Medications    Scheduled Meds:  apixaban  5 mg Oral BID   Chlorhexidine Gluconate Cloth  6 each Topical Q0600   docusate sodium  100 mg Oral BID   fiber supplement (BANATROL TF)  60 mL Oral BID   furosemide  40 mg Intravenous BID   insulin aspart  0-20 Units Subcutaneous Q4H   insulin aspart  3 Units Subcutaneous TID WC   insulin glargine-yfgn  14 Units Subcutaneous QHS   lidocaine  1 patch Transdermal Q24H   melatonin  3 mg Oral QHS   metoprolol tartrate  200 mg Oral BID   multivitamin with minerals  1 tablet Oral Daily   OLANZapine  7.5 mg Oral QHS   oxyCODONE  10 mg Oral Q6H   polyethylene glycol  17 g Oral Daily   potassium chloride  40 mEq Oral TID   senna  1 tablet Oral Daily   tamsulosin  0.4 mg Oral Daily   traZODone  100 mg Oral QHS   zolpidem  5 mg Oral QHS   Continuous Infusions:  sodium chloride 250 mL (01/19/22 1033)   PRN Meds: HYDROmorphone, ipratropium-albuterol, lip balm, ondansetron (ZOFRAN) IV, mouth rinse, traZODone   Vital Signs    Vitals:   01/23/22 1952 01/23/22 2313 01/24/22 0314 01/24/22 0328  BP: 126/70 131/75 (!) 142/89   Pulse: 64 65 64   Resp: 18 20 20    Temp: 97.8 F (36.6 C) 98.7 F (37.1 C) 98.7 F (37.1 C)   TempSrc: Oral Oral Oral   SpO2: 99% 90% 91%   Weight:    (!) 157.8 kg  Height:        Intake/Output Summary (Last 24 hours) at 01/24/2022 1143 Last data filed at 01/24/2022 1052 Gross per 24 hour  Intake --  Output 730 ml  Net -730 ml       01/24/2022    3:28 AM 01/23/2022    4:39 AM 01/22/2022    5:00 AM  Last 3 Weights  Weight (lbs) 347 lb 14.2 oz 351 lb 6.6 oz 354 lb 8 oz  Weight (kg) 157.8 kg 159.4 kg 160.8 kg      Telemetry    Afib 70-90s, PVCs - Personally  Reviewed  ECG    No tracing this morning  Physical Exam   GEN: Obese older male, on room air  Neck: JVD to mid neck with HOB 45 degree Cardiac: Irreg Irreg, no murmurs, rubs, or gallops.  Respiratory: Clear to auscultation bilaterally. On room air.  GI: Soft, nontender, non-distended  MS: 1- LE edema; No deformity. Neuro:  Nonfocal  Psych: Normal affect   Labs    High Sensitivity Troponin:   Recent Labs  Lab 01/13/22 2331 01/14/22 0109  TROPONINIHS 20* 15      Chemistry Recent Labs  Lab 01/18/22 0504 01/18/22 2334 01/22/22 0500 01/23/22 0349 01/24/22 0231  NA 140   < > 142 142 144  K 3.9   < > 3.4* 3.5 3.2*  CL 104   < > 101 105 103  CO2 23   < > 27 27 29   GLUCOSE 174*   < > 140* 180* 121*  BUN 37*   < > 46* 35* 26*  CREATININE 2.70*   < >  1.69* 1.55* 1.33*  CALCIUM 8.7*   < > 9.3 8.7* 8.9  MG  --    < > 2.0 1.8 1.9  ALBUMIN 2.6*  --   --   --   --   GFRNONAA 26*   < > 45* 50* 60*  ANIONGAP 13   < > 14 10 12    < > = values in this interval not displayed.     Lipids  No results for input(s): "CHOL", "TRIG", "HDL", "LABVLDL", "LDLCALC", "CHOLHDL" in the last 168 hours.   Hematology Recent Labs  Lab 01/21/22 0425 01/22/22 0500 01/23/22 0349  WBC 11.2* 10.0 8.9  RBC 5.08 5.41 4.52  HGB 14.6 15.2 12.9*  HCT 44.4 47.9 40.7  MCV 87.4 88.5 90.0  MCH 28.7 28.1 28.5  MCHC 32.9 31.7 31.7  RDW 16.3* 16.8* 16.2*  PLT 117* 153 137*    Thyroid No results for input(s): "TSH", "FREET4" in the last 168 hours.  BNP Recent Labs  Lab 01/20/22 0508  BNP 1,077.6*     DDimer No results for input(s): "DDIMER" in the last 168 hours.   Radiology    No results found.  Cardiac Studies   Echo: 01/14/22   1. Technically difficult study, very limited views even with contrast  administration   2. Left ventricular ejection fraction, by estimation, is grossly 40 to  45%. The left ventricle has mildly decreased function. Left ventricular  endocardial border not  optimally defined to evaluate regional wall motion.  Left ventricular diastolic  parameters are indeterminate.   3. Right ventricule is not well visualized but grossly normal size and  systolic function. There is normal pulmonary artery systolic pressure.   4. The mitral valve is grossly normal. Trivial mitral valve  regurgitation.   5. The aortic valve was not well visualized. Aortic valve regurgitation  is not visualized. No aortic stenosis is present.   6. The inferior vena cava is dilated in size with <50% respiratory  variability, suggesting right atrial pressure of 15 mmHg.   FINDINGS   Left Ventricle: Left ventricular ejection fraction, by estimation, is 40  to 45%. The left ventricle has mildly decreased function. Left ventricular  endocardial border not optimally defined to evaluate regional wall motion.  Definity contrast agent was  given IV to delineate the left ventricular endocardial borders. The left  ventricular internal cavity size was normal in size. There is no left  ventricular hypertrophy. Left ventricular diastolic parameters are  indeterminate.   Right Ventricle: The right ventricular size is not well visualized. Right  vetricular wall thickness was not well visualized. Right ventricular  systolic function was not well visualized. There is normal pulmonary  artery systolic pressure. The tricuspid  regurgitant velocity is 2.14 m/s, and with an assumed right atrial  pressure of 15 mmHg, the estimated right ventricular systolic pressure is  33.3 mmHg.   Left Atrium: Left atrial size was not well visualized.   Right Atrium: Right atrial size was not well visualized.   Pericardium: Trivial pericardial effusion is present.   Mitral Valve: The mitral valve is grossly normal. Trivial mitral valve  regurgitation.   Tricuspid Valve: The tricuspid valve is normal in structure. Tricuspid  valve regurgitation is trivial.   Aortic Valve: The aortic valve was not well  visualized. Aortic valve  regurgitation is not visualized. No aortic stenosis is present.   Pulmonic Valve: The pulmonic valve was not well visualized. Pulmonic valve  regurgitation is not visualized.   Aorta:  The aortic root is normal in size and structure.   Venous: The inferior vena cava is dilated in size with less than 50%  respiratory variability, suggesting right atrial pressure of 15 mmHg.   IAS/Shunts: The interatrial septum was not well visualized.   Patient Profile     66 y.o. male  with a hx of HTN CAD (60% mLAD, 30% D2), diabetes, permanent atrial fibrillation s/p failed DCCV, NICM last EF 35-40%, OSA, morbid obesity who is being seen 01/20/2022 for the evaluation of atrial fib at the request of Dr. Jarvis Newcomer.  Assessment & Plan    Permanent Afib: rate is controlled on high dose metoprolol 200mg  BID.  Was also on amiodarone but discontinued.. Will continue for rate control. continue Eliquis 5mg  BID. Keep Mag >2 and K >4.   Acute on chronic systolic heart failure: LVEF 40-45%, normal RV size and function. Normal pulmonary artery systolic pressure. Trival MR. Net - 10L, weight down to 348 from 381 since admission, Cr improving. Volume status improving, continue IV lasix 40mg  BID for now  AKI due to ATN - s/p CRRT 8/9-8/11 -- Cr peaked at 10, slowing improving. Now down to 1.3 today - per primary team   HTN --Controlled on metoprolol 200mg  BID  For questions or updates, please contact [facility] Please consult [email or web address] for contact info under   Signed, , MD  01/24/2022, 11:43 AM

## 2022-01-24 NOTE — Progress Notes (Signed)
Physical Therapy Treatment Patient Details Name: Taylor [name] MRN: [id number] DOB: Oct 19, 1955 Today's Date: 01/24/2022   History of Present Illness 66 year old who presented with increased lethargy and found with AKI requiring dialysis; Started CRRT 8/9 and held CRRT 8/11, monitoring for renal recovery; intubated 8/9- 8/12. 8/13 developed afib.  PMH includes: HTN, chronic systolic heart failure, atrial fibrillation, DM2 and chronic pain medications on home dilaudid, oxycontin, lyrica, ambien    PT Comments    Pt received supine and agreeable to session with light encouragement, with slow progress this session secondary to pt stated lightheadedness, however pt pleasant and participatory. Pt tolerating short bout of ambulation in room with RW and min assist, and demonstrating repeated transfers sit<>stand with min guard to RW from EOB and recliner. Pt with fair tolerance for seated therex for increased ROM and strength. Pt continues to benefit from skilled PT services to progress toward functional mobility goals.    Recommendations for follow up therapy are one component of a multi-disciplinary discharge planning process, led by the attending physician.  Recommendations may be updated based on patient status, additional functional criteria and insurance authorization.  Follow Up Recommendations  Home health PT     Assistance Recommended at Discharge Frequent or constant Supervision/Assistance  Patient can return home with the following A little help with walking and/or transfers;A little help with bathing/dressing/bathroom;Assistance with cooking/housework;Assist for transportation   Equipment Recommendations  Rolling walker (2 wheels);BSC/3in1    Recommendations for Other Services       Precautions / Restrictions Precautions Precautions: Fall Precaution Comments: HR, sats Restrictions Weight Bearing Restrictions: No     Mobility  Bed Mobility Overal bed mobility: Needs  Assistance Bed Mobility: Supine to Sit, Sit to Supine     Supine to sit: Min assist, HOB elevated     General bed mobility comments: light min HHA asssit to elevate trunk, in recliner post session    Transfers Overall transfer level: Needs assistance Equipment used: Rolling walker (2 wheels) Transfers: Sit to/from Stand Sit to Stand: Min assist, Min guard           General transfer comment: light assist to rise on initital stant, steady assist, STS x2 from EOB, x5 from recliner    Ambulation/Gait Ambulation/Gait assistance: Min assist Gait Distance (Feet): 15 Feet Assistive device: Rolling walker (2 wheels) Gait Pattern/deviations: Step-through pattern, Decreased stride length, Trunk flexed Gait velocity: decr     General Gait Details: cues for upright posture, min assist to steady, pt endorsing lightheadiness, further ambulation deferred   Stairs             Wheelchair Mobility    Modified Rankin (Stroke Patients Only)       Balance Overall balance assessment: Needs assistance Sitting-balance support: No upper extremity supported, Feet supported Sitting balance-Leahy Scale: Fair Sitting balance - Comments: able to sit on EOB without assistance   Standing balance support: Bilateral upper extremity supported, During functional activity Standing balance-Leahy Scale: Poor Standing balance comment: reliant on external support                            Cognition Arousal/Alertness: Awake/alert Behavior During Therapy: WFL for tasks assessed/performed Overall Cognitive Status: Impaired/Different from baseline Area of Impairment: Attention, Following commands, Safety/judgement, Problem solving                 Orientation Level: Disoriented to, Time (asking what day it was) Current Attention Level:  Selective   Following Commands: Follows one step commands with increased time Safety/Judgement: Decreased awareness of safety   Problem  Solving: Slow processing, Requires verbal cues, Decreased initiation, Difficulty sequencing General Comments: pt more aware of deficits and need to take rest breaks today        Exercises General Exercises - Lower Extremity Long Arc Quad: Right, Left, 20 reps, Seated Hip Flexion/Marching: Right, Left, 20 reps, Seated Heel Raises: Right, Left, Seated    General Comments        Pertinent Vitals/Pain Pain Assessment Pain Assessment: Faces Faces Pain Scale: Hurts a little bit Pain Location: back Pain Descriptors / Indicators: Sore, Aching Pain Intervention(s): Monitored during session, Limited activity within patient's tolerance, Repositioned    Home Living                          Prior Function            PT Goals (current goals can now be found in the care plan section) Acute Rehab PT Goals PT Goal Formulation: With patient Time For Goal Achievement: 02/01/22    Frequency    Min 3X/week      PT Plan Current plan remains appropriate    Co-evaluation              AM-PAC PT "6 Clicks" Mobility   Outcome Measure  Help needed turning from your back to your side while in a flat bed without using bedrails?: A Little Help needed moving from lying on your back to sitting on the side of a flat bed without using bedrails?: A Little Help needed moving to and from a bed to a chair (including a wheelchair)?: A Little Help needed standing up from a chair using your arms (e.g., wheelchair or bedside chair)?: A Little Help needed to walk in hospital room?: A Little Help needed climbing 3-5 steps with a railing? : A Lot 6 Click Score: 17    End of Session Equipment Utilized During Treatment: Gait belt Activity Tolerance: Patient tolerated treatment well;Patient limited by fatigue Patient left: in chair;with call bell/phone within reach;with chair alarm set Nurse Communication: Mobility status PT Visit Diagnosis: Other abnormalities of gait and mobility  (R26.89);Difficulty in walking, not elsewhere classified (R26.2)     Time: 3875-6433 PT Time Calculation (min) (ACUTE ONLY): 26 min  Charges:  $Therapeutic Exercise: 8-22 mins $Therapeutic Activity: 8-22 mins                     Alyshia Kernan R. PTA Acute Rehabilitation Services Office: [phone]    Catalina Antigua 01/24/2022, 2:21 PM

## 2022-01-25 DIAGNOSIS — J9602 Acute respiratory failure with hypercapnia: Secondary | ICD-10-CM | POA: Diagnosis not present

## 2022-01-25 DIAGNOSIS — G9340 Encephalopathy, unspecified: Secondary | ICD-10-CM | POA: Diagnosis not present

## 2022-01-25 DIAGNOSIS — N179 Acute kidney failure, unspecified: Secondary | ICD-10-CM | POA: Diagnosis not present

## 2022-01-25 DIAGNOSIS — I959 Hypotension, unspecified: Secondary | ICD-10-CM | POA: Diagnosis not present

## 2022-01-25 DIAGNOSIS — I4821 Permanent atrial fibrillation: Secondary | ICD-10-CM | POA: Diagnosis not present

## 2022-01-25 DIAGNOSIS — I5023 Acute on chronic systolic (congestive) heart failure: Secondary | ICD-10-CM | POA: Diagnosis not present

## 2022-01-25 LAB — BASIC METABOLIC PANEL
Anion gap: 7 (ref 5–15)
BUN: 18 mg/dL (ref 8–23)
CO2: 29 mmol/L (ref 22–32)
Calcium: 8.4 mg/dL — ABNORMAL LOW (ref 8.9–10.3)
Chloride: 104 mmol/L (ref 98–111)
Creatinine, Ser: 1.36 mg/dL — ABNORMAL HIGH (ref 0.61–1.24)
GFR, Estimated: 58 mL/min — ABNORMAL LOW (ref >60)
Glucose, Bld: 146 mg/dL — ABNORMAL HIGH (ref 70–99)
Potassium: 3.4 mmol/L — ABNORMAL LOW (ref 3.5–5.1)
Sodium: 140 mmol/L (ref 135–145)

## 2022-01-25 LAB — GLUCOSE, CAPILLARY
Glucose-Capillary: 200 mg/dL — ABNORMAL HIGH (ref 70–99)
Glucose-Capillary: 160 mg/dL — ABNORMAL HIGH (ref 70–99)
Glucose-Capillary: 185 mg/dL — ABNORMAL HIGH (ref 70–99)
Glucose-Capillary: 170 mg/dL — ABNORMAL HIGH (ref 70–99)
Glucose-Capillary: 175 mg/dL — ABNORMAL HIGH (ref 70–99)
Glucose-Capillary: 153 mg/dL — ABNORMAL HIGH (ref 70–99)

## 2022-01-25 LAB — MAGNESIUM: Magnesium: 2.1 mg/dL (ref 1.7–2.4)

## 2022-01-25 MED ORDER — POTASSIUM CHLORIDE CRYS ER 20 MEQ PO TBCR
40.0000 meq | EXTENDED_RELEASE_TABLET | Freq: Three times a day (TID) | ORAL | Status: AC
  Administered 2022-01-25 (×3): 40 meq via ORAL
  Filled 2022-01-25 (×3): qty 2

## 2022-01-25 MED ORDER — TORSEMIDE 20 MG PO TABS
20.0000 mg | ORAL_TABLET | Freq: Two times a day (BID) | ORAL | Status: DC
  Administered 2022-01-25 – 2022-01-26 (×2): 20 mg via ORAL
  Filled 2022-01-25 (×2): qty 1

## 2022-01-25 NOTE — Progress Notes (Signed)
Pt refused CPAP. RT explained why pt needs to wear it at night but pt still refused.

## 2022-01-25 NOTE — Progress Notes (Signed)
Progress Note  Patient Name: Taylor [name] Date of Encounter: 01/25/2022  [facility] Cardiologist: Debbe Odea, MD   Subjective   Incomplete I/Os.  Cr stable at 1.36. Weight down 5 lbs from yesterday.  Denies any dyspnea.   Inpatient Medications    Scheduled Meds:  apixaban  5 mg Oral BID   Chlorhexidine Gluconate Cloth  6 each Topical Q0600   docusate sodium  100 mg Oral BID   fiber supplement (BANATROL TF)  60 mL Oral BID   furosemide  40 mg Intravenous BID   insulin aspart  0-20 Units Subcutaneous Q4H   insulin aspart  3 Units Subcutaneous TID WC   insulin glargine-yfgn  14 Units Subcutaneous QHS   lidocaine  1 patch Transdermal Q24H   melatonin  3 mg Oral QHS   metoprolol tartrate  200 mg Oral BID   multivitamin with minerals  1 tablet Oral Daily   OLANZapine  7.5 mg Oral QHS   oxyCODONE  10 mg Oral Q6H   polyethylene glycol  17 g Oral Daily   potassium chloride  40 mEq Oral TID   senna  1 tablet Oral Daily   tamsulosin  0.4 mg Oral Daily   traZODone  100 mg Oral QHS   zolpidem  5 mg Oral QHS   Continuous Infusions:  sodium chloride 250 mL (01/19/22 1033)   PRN Meds: HYDROmorphone, ipratropium-albuterol, lip balm, ondansetron (ZOFRAN) IV, mouth rinse, traZODone   Vital Signs    Vitals:   01/24/22 2307 01/25/22 0329 01/25/22 0354 01/25/22 0758  BP: (!) 98/58 120/85  110/72  Pulse: 69 77  66  Resp: 18 18  17   Temp: 98.7 F (37.1 C) 97.8 F (36.6 C)  97.7 F (36.5 C)  TempSrc: Oral Oral  Axillary  SpO2: 95% 95%  99%  Weight:   (!) 155.5 kg   Height:        Intake/Output Summary (Last 24 hours) at 01/25/2022 1259 Last data filed at 01/24/2022 2200 Gross per 24 hour  Intake --  Output 1250 ml  Net -1250 ml       01/25/2022    3:54 AM 01/24/2022    3:28 AM 01/23/2022    4:39 AM  Last 3 Weights  Weight (lbs) 342 lb 13 oz 347 lb 14.2 oz 351 lb 6.6 oz  Weight (kg) 155.5 kg 157.8 kg 159.4 kg      Telemetry    Afib rate controlled,  PVCs- Personally Reviewed  ECG    No tracing this morning  Physical Exam   GEN: Obese older male, on room air  Neck: difficult to assess JVD given habitus Cardiac: Irreg, no murmurs, rubs, or gallops.  Respiratory: Clear to auscultation bilaterally. On room air.  GI: Soft, nontender, non-distended  MS: trace LE edema; No deformity. Neuro:  Nonfocal  Psych: Normal affect   Labs    High Sensitivity Troponin:   Recent Labs  Lab 01/13/22 2331 01/14/22 0109  TROPONINIHS 20* 15      Chemistry Recent Labs  Lab 01/23/22 0349 01/24/22 0231 01/25/22 0721  NA 142 144 140  K 3.5 3.2* 3.4*  CL 105 103 104  CO2 27 29 29   GLUCOSE 180* 121* 146*  BUN 35* 26* 18  CREATININE 1.55* 1.33* 1.36*  CALCIUM 8.7* 8.9 8.4*  MG 1.8 1.9 2.1  GFRNONAA 50* 60* 58*  ANIONGAP 10 12 7      Lipids  No results for input(s): "CHOL", "TRIG", "HDL", "LABVLDL", "  LDLCALC", "CHOLHDL" in the last 168 hours.   Hematology Recent Labs  Lab 01/21/22 0425 01/22/22 0500 01/23/22 0349  WBC 11.2* 10.0 8.9  RBC 5.08 5.41 4.52  HGB 14.6 15.2 12.9*  HCT 44.4 47.9 40.7  MCV 87.4 88.5 90.0  MCH 28.7 28.1 28.5  MCHC 32.9 31.7 31.7  RDW 16.3* 16.8* 16.2*  PLT 117* 153 137*    Thyroid No results for input(s): "TSH", "FREET4" in the last 168 hours.  BNP Recent Labs  Lab 01/20/22 0508  BNP 1,077.6*     DDimer No results for input(s): "DDIMER" in the last 168 hours.   Radiology    No results found.  Cardiac Studies   Echo: 01/14/22   1. Technically difficult study, very limited views even with contrast  administration   2. Left ventricular ejection fraction, by estimation, is grossly 40 to  45%. The left ventricle has mildly decreased function. Left ventricular  endocardial border not optimally defined to evaluate regional wall motion.  Left ventricular diastolic  parameters are indeterminate.   3. Right ventricule is not well visualized but grossly normal size and  systolic function.  There is normal pulmonary artery systolic pressure.   4. The mitral valve is grossly normal. Trivial mitral valve  regurgitation.   5. The aortic valve was not well visualized. Aortic valve regurgitation  is not visualized. No aortic stenosis is present.   6. The inferior vena cava is dilated in size with <50% respiratory  variability, suggesting right atrial pressure of 15 mmHg.   FINDINGS   Left Ventricle: Left ventricular ejection fraction, by estimation, is 40  to 45%. The left ventricle has mildly decreased function. Left ventricular  endocardial border not optimally defined to evaluate regional wall motion.  Definity contrast agent was  given IV to delineate the left ventricular endocardial borders. The left  ventricular internal cavity size was normal in size. There is no left  ventricular hypertrophy. Left ventricular diastolic parameters are  indeterminate.   Right Ventricle: The right ventricular size is not well visualized. Right  vetricular wall thickness was not well visualized. Right ventricular  systolic function was not well visualized. There is normal pulmonary  artery systolic pressure. The tricuspid  regurgitant velocity is 2.14 m/s, and with an assumed right atrial  pressure of 15 mmHg, the estimated right ventricular systolic pressure is  33.3 mmHg.   Left Atrium: Left atrial size was not well visualized.   Right Atrium: Right atrial size was not well visualized.   Pericardium: Trivial pericardial effusion is present.   Mitral Valve: The mitral valve is grossly normal. Trivial mitral valve  regurgitation.   Tricuspid Valve: The tricuspid valve is normal in structure. Tricuspid  valve regurgitation is trivial.   Aortic Valve: The aortic valve was not well visualized. Aortic valve  regurgitation is not visualized. No aortic stenosis is present.   Pulmonic Valve: The pulmonic valve was not well visualized. Pulmonic valve  regurgitation is not visualized.    Aorta: The aortic root is normal in size and structure.   Venous: The inferior vena cava is dilated in size with less than 50%  respiratory variability, suggesting right atrial pressure of 15 mmHg.   IAS/Shunts: The interatrial septum was not well visualized.   Patient Profile     66 y.o. male  with a hx of HTN CAD (60% mLAD, 30% D2), diabetes, permanent atrial fibrillation s/p failed DCCV, NICM last EF 35-40%, OSA, morbid obesity who is being seen 01/20/2022  for the evaluation of atrial fib at the request of Dr. Jarvis Newcomer.  Assessment & Plan    Permanent Afib: rate is controlled on high dose metoprolol 200mg  BID.  Was also on amiodarone but discontinued.. Will continue metoprolol for rate control. continue Eliquis 5mg  BID.  Acute on chronic systolic heart failure: LVEF 40-45%, normal RV size and function. Normal pulmonary artery systolic pressure. Trival MR. Net - 13L, weight down to 343 from 381 since admission, Cr improving. Volume status improved, will convert to PO torsemide 20 mg BID  AKI due to ATN - s/p CRRT 8/9-8/11 -- Cr peaked at 10, slowing improving. Now down to 1.36 today - per primary team   HTN --Controlled on metoprolol 200mg  BID  For questions or updates, please contact [facility] Please consult [email or web address] for contact info under   Signed, , MD  01/25/2022, 12:59 PM

## 2022-01-25 NOTE — Progress Notes (Signed)
Progress Note  Patient: Taylor [name] ZOX:[id number] DOB: 05-12-1956  DOA: 01/13/2022  DOS: 01/25/2022    Brief hospital course: Taylor [name] is a 66 y.o. male with a history of NICM, chronic HFrEF (LVEF 35-40%), CAD, permanent atrial fibrillation s/p failed DCCV, HTN, HLD, T2DM, morbid obesity, and chronic back pain on oxycodone, dilaudid, lyrica who presented to the ED 8/8 with lethargy, nausea. He was encephalopathic with leg edema, in renal failure with creatinine of 9.94 from baseline of 1.04, hypotensive requiring pressors. He was transferred from [facility] ICU and underwent CRRT 8/9 - 8/11, extubated 8/12. Neurology was involved for concern of generalized tonic-clonic seizure which was treated with depakote. EEG and MRI were negative, so this was stopped. Lyrica has been discontinued as well.   Hospitalization complicated by acute urinary retention, RVR and pulmonary edema, though this is improving and he is felt stable for transfer to medical service to pick up 8/4.   Assessment and Plan: Acute hypoxic respiratory failure: Pt states he's been told he needed oxygen in the past but never pursued it. Has weaned from oxygen.  Acute on chronic HFrEF: With pulmonary edema, likely related to rapid atrial fibrillation. LVEF 40-45%.  - Continue diuretic, will transition to po torsemide today and monitor UOP. EDW felt to be ~155-156kg  - Continue to monitor I/O, weights.  - Home torsemide was 40mg  qAM, 20mg  qPM. Will likely return to that at discharge. - Holding SGLT2i, ACEi in setting of renal failure. Should be able to start SGLT2i at discharge, perhaps also   Permanent atrial fibrillation with RVR: s/p failed DCCV March and Sept 2022.  - Continue metoprolol 200mg , initiated amiodarone, but with return to euvolemia, rate control has been improved, stopped amio.   - Cardiology consult appreciated. Will need outpatient follow up at discharge.  - Continue DOAC - Said not to be an ablation  candidate due to morbid obesity.  - Supp K to keep at goal 4. Mg at goal >2. Note TSH 3.762.  Hypokalemia: Supplement TID again today and monitor  Acute toxic and metabolic encephalopathy: Due to polypharmacy in the setting of renal failure and uremia. - Continue delirium precautions, optimizing metabolic parameters, holding oxycontin, and reduced ambien to 5mg  and dilaudid dose to 2mg . - Started qHS zyprexa, melatonin to help with day-night cycles. - Resolved.   Acute renal failure due to ATN: Resolved, s/p CRRT 8/9 - 8/11 with subsequent good UOP and Cr declining.  - Monitor UOP, Cr.  - Creatinine baseline unclear, though may be ~1.3-1.4. CrCl now >64ml/min.    Deconditioning: Pt urged to get OOB. Will continue PT/OT and anticipate improvement back near functional baseline with improved volume status, renal status, and mental status.  Morbid obesity: Body mass index is 45.23 kg/m.  - Pt's PCP has discussed ozempic, will plan to trial (has had rybelsus intolerance (nausea)) after discharge. - Nearing EDW as above, 155kg.   Acute urinary retention:  - To facilitate mobility efforts, DC'ed foley 8/18, not retaining since. - Continue new tamsulosin   Chronic normocytic anemia:  - Mild, monitor while on anticoagulation. Normalized.  Thrombocytopenia: Unclear etiology, resolved.  HTN: Stable  HLD:  - Holding statin for now  T2DM:  - Started glargine, increase to 14u qHS, continue SSI, add mealtime coverage 3u. At inpatient goal currently. - Holding home meds with renal impairment. Will be able to restart at discharge with urgent PCP follow up for continued management.   Leukocytosis: Noted. Also had diarrhea  though this is resolved and abd exam benign.  - Resolved without antimicrobial Tx   Subjective: Slept well last night, breathing normally, less leg swelling today, down 5lbs overnight. Cr stabilized, normal UOP with mild discomfort, no hematuria.   Objective: Vitals:    01/24/22 2307 01/25/22 0329 01/25/22 0354 01/25/22 0758  BP: (!) 98/58 120/85  110/72  Pulse: 69 77  66  Resp: 18 18  17   Temp: 98.7 F (37.1 C) 97.8 F (36.6 C)  97.7 F (36.5 C)  TempSrc: Oral Oral  Axillary  SpO2: 95% 95%  99%  Weight:   (!) 155.5 kg   Height:       Gen: Pleasant chronically ill-appearing obese male in no distress Pulm: Nonlabored breathing room air. Clear. CV: Irreg irreg, rate in 70's without murmur, rub, or gallop. No JVD, trace dependent edema. GI: Abdomen soft, non-tender, non-distended, with normoactive bowel sounds.  Ext: Warm, no deformities Skin: No rashes, lesions or ulcers on visualized skin. Neuro: Alert and oriented. No focal neurological deficits. Psych: Judgement and insight appear fair. Mood euthymic & affect congruent. Behavior is appropriate.     Data Personally reviewed: CBC: Recent Labs  Lab 01/19/22 0712 01/20/22 0508 01/21/22 0425 01/22/22 0500 01/23/22 0349  WBC 8.6 11.7* 11.2* 10.0 8.9  HGB 12.8* 13.3 14.6 15.2 12.9*  HCT 39.3 41.1 44.4 47.9 40.7  MCV 86.9 87.8 87.4 88.5 90.0  PLT 143* 176 117* 153 137*   Basic Metabolic Panel: Recent Labs  Lab 01/21/22 0425 01/22/22 0500 01/23/22 0349 01/24/22 0231 01/25/22 0721  NA 141 142 142 144 140  K 4.2 3.4* 3.5 3.2* 3.4*  CL 103 101 105 103 104  CO2 24 27 27 29 29   GLUCOSE 190* 140* 180* 121* 146*  BUN 51* 46* 35* 26* 18  CREATININE 2.06* 1.69* 1.55* 1.33* 1.36*  CALCIUM 9.0 9.3 8.7* 8.9 8.4*  MG 2.2 2.0 1.8 1.9 2.1   Urine analysis:    Component Value Date/Time   COLORURINE YELLOW 01/14/2022 0631   APPEARANCEUR HAZY (A) 01/14/2022 0631   LABSPEC 1.016 01/14/2022 0631   PHURINE 5.0 01/14/2022 0631   GLUCOSEU 50 (A) 01/14/2022 0631   HGBUR MODERATE (A) 01/14/2022 0631   BILIRUBINUR NEGATIVE 01/14/2022 0631   KETONESUR NEGATIVE 01/14/2022 0631   PROTEINUR 100 (A) 01/14/2022 0631   NITRITE NEGATIVE 01/14/2022 0631   LEUKOCYTESUR NEGATIVE 01/14/2022 0631   Family  Communication: None at bedside.  Disposition: Status is: Inpatient Remains inpatient appropriate because: Severity of illness, IV diuresis > DC home if stable 8/21.  Planned Discharge Destination: Home with home health if functional mobility continues improving.   03/16/2022, MD 01/25/2022 2:03 PM Page by [email or web address]

## 2022-01-26 DIAGNOSIS — I5023 Acute on chronic systolic (congestive) heart failure: Secondary | ICD-10-CM | POA: Diagnosis not present

## 2022-01-26 DIAGNOSIS — I4821 Permanent atrial fibrillation: Secondary | ICD-10-CM | POA: Diagnosis not present

## 2022-01-26 DIAGNOSIS — J9602 Acute respiratory failure with hypercapnia: Secondary | ICD-10-CM | POA: Diagnosis not present

## 2022-01-26 DIAGNOSIS — G9340 Encephalopathy, unspecified: Secondary | ICD-10-CM | POA: Diagnosis not present

## 2022-01-26 DIAGNOSIS — N179 Acute kidney failure, unspecified: Secondary | ICD-10-CM | POA: Diagnosis not present

## 2022-01-26 LAB — BASIC METABOLIC PANEL
Anion gap: 12 (ref 5–15)
BUN: 15 mg/dL (ref 8–23)
CO2: 27 mmol/L (ref 22–32)
Calcium: 8.8 mg/dL — ABNORMAL LOW (ref 8.9–10.3)
Chloride: 101 mmol/L (ref 98–111)
Creatinine, Ser: 1.3 mg/dL — ABNORMAL HIGH (ref 0.61–1.24)
GFR, Estimated: >60 mL/min (ref >60)
Glucose, Bld: 187 mg/dL — ABNORMAL HIGH (ref 70–99)
Potassium: 4.5 mmol/L (ref 3.5–5.1)
Sodium: 140 mmol/L (ref 135–145)

## 2022-01-26 LAB — GLUCOSE, CAPILLARY
Glucose-Capillary: 114 mg/dL — ABNORMAL HIGH (ref 70–99)
Glucose-Capillary: 145 mg/dL — ABNORMAL HIGH (ref 70–99)

## 2022-01-26 MED ORDER — TAMSULOSIN HCL 0.4 MG PO CAPS: 0.4000 mg | ORAL_CAPSULE | Freq: Every day | ORAL | 0 refills | Status: DC

## 2022-01-26 MED ORDER — OXYCONTIN 20 MG PO T12A: 20.0000 mg | EXTENDED_RELEASE_TABLET | Freq: Two times a day (BID) | ORAL | 0 refills | Status: DC

## 2022-01-26 MED ORDER — POTASSIUM CHLORIDE CRYS ER 20 MEQ PO TBCR: 20.0000 meq | EXTENDED_RELEASE_TABLET | Freq: Every day | ORAL | 0 refills | Status: DC

## 2022-01-26 MED ORDER — TORSEMIDE 20 MG PO TABS: 20.0000 mg | ORAL_TABLET | Freq: Two times a day (BID) | ORAL | 0 refills | Status: DC

## 2022-01-26 NOTE — TOC Transition Note (Signed)
Transition of Care [facility]) - CM/SW Discharge Note   Patient Details  Name: Taylor [name] MRN: [id number] Date of Birth: [birth year]  [facility]) CM/SW Contact:  Bess Kinds, RN Phone Number: [phone] 01/26/2022, 8:56 AM   Clinical Narrative:     Patient to transition home today. Previous RNCM had arranged for [facility] for potential [facility] PT/OT. Confirmed that [facility] is able to accept referral. HH orders sent to MD. No further TOC needs identified at this time.   Final next level of care: Home w Home Health Services Barriers to Discharge: No Barriers Identified   Patient Goals and CMS Choice Patient states their goals for this hospitalization and ongoing recovery are:: return home CMS Medicare.gov Compare Post Acute Care list provided to:: Patient Choice offered to / list presented to : Patient  Discharge Placement                       Discharge Plan and Services   Discharge Planning Services: CM Consult Post Acute Care Choice: Home Health                    HH Arranged: PT, OT [facility] Agency: [facility] Date [facility] Agency Contacted: 01/26/22 Time HH Agency Contacted: [phone] Representative spoke with at [facility] Agency: Kandee Keen  Social Determinants of Health (SDOH) Interventions     Readmission Risk Interventions     No data to display

## 2022-01-26 NOTE — Discharge Summary (Signed)
Physician Discharge Summary   Patient: Taylor [name] MRN: [id number] DOB: 1956-04-12  Admit date:     01/13/2022  Discharge date: 01/26/22  Discharge Physician: Patrecia Pour   PCP: Birdie Sons, MD   Recommendations at discharge:  Follow up with cardiology, Dr. Garen Lah in next 1-2 weeks. Discharge on torsemide 57m BID. Follow up with PCP in next 1-2 weeks. Will need repeat BMP. Started flomax for urinary retention. Continue pain management, note deescalation of dilaudid dose to 255mprn doses from 70m41mnd oxycontin 11m85m2h instead of 3-4/day. Suggest lowering dose of ambien as well. No new prescriptions provided at discharge.   Discharge Diagnoses: Principal Problem:   AKI (acute kidney injury) (HCC)[place]:   Acute on chronic systolic heart failure (HCC)   Hypotension   Acute respiratory failure with hypercapnia (HCC)   Shock (HCC)   Acute encephalopathy   [facility] Taylor [name] with a history of NICM, chronic HFrEF (LVEF 35-40%), CAD, permanent atrial fibrillation s/p failed DCCV, HTN, HLD, T2DM, morbid obesity, and chronic back pain on oxycodone, dilaudid, lyrica who presented to the ED 8/8 with lethargy, nausea. He was encephalopathic with leg edema, in renal failure with creatinine of 9.94 from baseline of 1.04, hypotensive requiring pressors. [facility] and underwent CRRT 8/9 - 8/11, extubated 8/12. Neurology was involved for concern of generalized tonic-clonic seizure which was treated with depakote. EEG and MRI were negative, so this was stopped.     Hospitalization complicated by acute urinary retention, RVR and pulmonary edema, though this is improving and he is felt stable for transfer to medical service to pick up 8/14. Having received significant IV fluids for support, he then required ongoing diuresis thereafter. Volume status has significantly improved, EDW at  discharge is 155kg. Cardiology has followed and cleared for discharge with diuretic and follow up recommendations. HR is well-controlled.   Mental status waxed and waned initially, though with transfer to floor and initiation of medications to regulate day/night circadian rhythm, mental status has normalized.   Assessment and Plan: Acute hypoxic respiratory failure: Pt states he's been told he needed oxygen in the past but never pursued it. Has weaned from oxygen.   Acute on chronic HFrEF: With pulmonary edema, likely related to rapid atrial fibrillation. LVEF 40-45%.  - EDW felt to be ~155kg  - Take torsemide 11mg53mBID per cardiology recommendation. - Holding SGLT2i, ACEi in setting of renal failure, consider starting at follow up.   Permanent atrial fibrillation with RVR: s/p failed DCCV March and Sept 2022.  - Continue metoprolol 200mg,56mtiated amiodarone, but with return to euvolemia, rate control has been improved, stopped amio.   - Cardiology consult appreciated. Will need outpatient follow up at discharge.  - Continue DOAC - Said not to be an ablation candidate due to morbid obesity.  - TSH 3.762.   Hypokalemia: Supplemented, continue this standing supp at home.   Acute toxic and metabolic encephalopathy: Due to polypharmacy in the setting of renal failure and uremia. - Resolved.    Acute renal failure due to ATN: Resolved, s/p CRRT 8/9 - 8/11 with subsequent good UOP and Cr declining. Baseline presumed to be 1.3.  - Continue torsemide and K supplement at discharge.    Deconditioning: At discharge, arranged HH PT [facility] OT   Morbid obesity: Body mass index is 45.23 kg/m.  - Pt's PCP has discussed  ozempic, will plan to trial (has had rybelsus intolerance (nausea)) after discharge. - Nearing EDW as above, 155kg.    Acute urinary retention:   - Continue new tamsulosin    Chronic normocytic anemia: Normalized.   Thrombocytopenia: Unclear etiology, resolved.   HTN: Stable,  stop lisinopril with normotension until follow up labs and BP check.    HLD:  - Ok to restart statin   T2DM:  - Will be able to restart at discharge with urgent PCP follow up for continued management.    Leukocytosis:   - Resolved without antimicrobial Tx    Consultants: Cardiology, PCCM, nephrology Procedures performed: Intubation, EEG, arterial line placement, CVC placement Disposition: Home Diet recommendation:  Discharge Diet Orders (From admission, onward)     Start     Ordered   01/26/22 0000  Diet - low sodium heart healthy        01/26/22 1003           DISCHARGE MEDICATION: Allergies as of 01/26/2022       Reactions   Other Other (See Comments)   Pain medication/ Must take brand named CAN NOT take generic        Medication List     STOP taking these medications    BC HEADACHE POWDER PO   lisinopril 10 MG tablet Commonly known as: ZESTRIL       TAKE these medications    dapagliflozin propanediol 10 MG Tabs tablet Commonly known as: Farxiga Take 1 tablet (10 mg total) by mouth daily before breakfast.   Eliquis 5 MG Tabs tablet Generic drug: apixaban TAKE 1 TABLET BY MOUTH TWICE DAILY   glipiZIDE 10 MG 24 hr tablet Commonly known as: GLUCOTROL XL TAKE 1 TABLET BY MOUTH ONCE DAILY.   HYDROmorphone 4 MG tablet Commonly known as: DILAUDID Take 4 mg by mouth See admin instructions. Brand name Dilaudid 4 mg - 4 mg 3-4 times daily (RX written for 4 times daily)   metFORMIN 1000 MG tablet Commonly known as: GLUCOPHAGE TAKE 1 TABLET BY MOUTH TWICE DAILY   metoprolol 200 MG 24 hr tablet Commonly known as: Toprol XL Take 1 tablet (200 mg total) by mouth daily.   Narcan 4 MG/0.1ML Liqd nasal spray kit Generic drug: naloxone Place 0.4 mg into the nose as needed (accidental overdose).   OxyCONTIN 20 mg 12 hr tablet Generic drug: oxyCODONE Take 1 tablet (20 mg total) by mouth every 12 (twelve) hours. What changed:  when to take  this additional instructions   pioglitazone 45 MG tablet Commonly known as: ACTOS TAKE 1 TABLET BY MOUTH ONCE DAILY.   potassium chloride SA 20 MEQ tablet Commonly known as: KLOR-CON M Take 1 tablet (20 mEq total) by mouth daily.   pregabalin 100 MG capsule Commonly known as: LYRICA Take 100 mg by mouth 3 (three) times daily.   simvastatin 40 MG tablet Commonly known as: ZOCOR TAKE 1 TABLET BY MOUTH ONCE DAILY.   tamsulosin 0.4 MG Caps capsule Commonly known as: FLOMAX Take 1 capsule (0.4 mg total) by mouth daily. Start taking on: January 27, 2022   torsemide 20 MG tablet Commonly known as: DEMADEX Take 1 tablet (20 mg total) by mouth 2 (two) times daily. What changed:  how much to take how to take this when to take this additional instructions   zolpidem 12.5 MG CR tablet Commonly known as: AMBIEN CR Take 12.5 mg by mouth at bedtime.        Follow-up Information  [facility] [place] Follow up.   Specialty: Home Health Services Why: Home health has been arranged. They will contact you early next week to schedule apt Contact information: [employer] [place] [address] [phone]         [facility] Follow up.   Specialty: Home Health Services Why: Someone from the office will call to schedule home health visits Contact information: [place] [address] [phone]         Birdie Sons, MD. Schedule an appointment as soon as possible for a visit.   Specialty: Family Medicine Contact information: [address] [place] [address] [phone]         Kate Sable, MD .   Specialties: Cardiology, Radiology Contact information: [address] [place] [address] [phone]                Discharge Exam: Danley Danker Weights   01/24/22 0328 01/25/22 0354 01/26/22 0435  Weight: (!) 157.8 kg (!) 155.5 kg (!) 155.9 kg  BP 133/85 (BP Location:  Left Arm)   Pulse 77   Temp 97.8 F (36.6 C) (Oral)   Resp 17   Ht 6' 1" (1.854 m)   Wt (!) 155.9 kg   SpO2 98%   BMI 45.35 kg/m   Pleasant chronically ill-appearing obese [name] in good spirits Nonlabored, clear irreg, irreg without MRG, edema is now trace Soft, NT, ND No wounds Alert, oriented, no focal deficits.   Condition at discharge: stable  The results of significant diagnostics from this hospitalization (including imaging, microbiology, ancillary and laboratory) are listed below for reference.   Imaging Studies: DG CHEST PORT 1 VIEW  Result Date: 01/21/2022 CLINICAL DATA:  Shortness of breath EXAM: PORTABLE CHEST 1 VIEW COMPARISON:  01/20/2022 FINDINGS: Cardiac shadow is enlarged but stable. Increased central vascular congestion is noted with mild edema. The overall appearance is stable given some variations in the film. No bony abnormality is noted. IMPRESSION: Stable changes of CHF. Electronically Signed   By: Inez Catalina M.D.   On: 01/21/2022 01:26   DG Chest Port 1 View  Result Date: 01/20/2022 CLINICAL DATA:  Shortness of breath.  Atrial fibrillation with RVR EXAM: PORTABLE CHEST 1 VIEW COMPARISON:  01/19/2022 FINDINGS: The cardio pericardial silhouette is enlarged. Vascular congestion with interstitial edema noted. No substantial pleural effusion. The visualized bony structures of the thorax are unremarkable. Telemetry leads overlie the chest. IMPRESSION: Cardiomegaly with vascular congestion and interstitial pulmonary edema, similar to prior. Electronically Signed   By: Misty Stanley M.D.   On: 01/20/2022 07:09   DG CHEST PORT 1 VIEW  Result Date: 01/19/2022 CLINICAL DATA:  Shortness of breath EXAM: PORTABLE CHEST 1 VIEW COMPARISON:  Previous studies including the examination of 01/18/2022 FINDINGS: Transverse diameter of heart is increased. There is partial clearing of interstitial pulmonary edema. No new focal infiltrates are seen. Left lateral CP angle is not  included in the image. There is no pneumothorax. IMPRESSION: Partial clearing of pulmonary vascular congestion and pulmonary edema. Electronically Signed   By: Elmer Picker M.D.   On: 01/19/2022 08:10   DG CHEST PORT 1 VIEW  Result Date: 01/18/2022 CLINICAL DATA:  Respiratory distress EXAM: PORTABLE CHEST 1 VIEW COMPARISON:  01/14/2022 FINDINGS: Tubes and lines have been removed in the interval from the prior exam. Cardiac shadow is enlarged. Diffuse vascular congestion is noted with interstitial edema bilaterally. The lung bases are not included  on this image. IMPRESSION: Changes of CHF with interstitial edema. Electronically Signed   By: Inez Catalina M.D.   On: 01/18/2022 23:02   MR BRAIN WO CONTRAST  Result Date: 01/15/2022 CLINICAL DATA:  Provided history: Seizure disorder, clinical change. EXAM: MRI HEAD WITHOUT CONTRAST TECHNIQUE: Multiplanar, multiecho pulse sequences of the brain and surrounding structures were obtained without intravenous contrast. COMPARISON:  Head CT 01/14/2022. FINDINGS: Mild-to-moderate intermittent motion degradation. Brain: Mild cerebral atrophy without definite lobar predominance. Small chronic lacunar infarct within the right external capsule (series 12, image 17). Prominent perivascular space within the left basal ganglia. Mild multifocal T2 FLAIR hyperintense signal abnormality within the cerebral white matter and pons, nonspecific but compatible chronic small vessel ischemic disease. No cortical encephalomalacia is identified. No appreciable hippocampal size or signal asymmetry. There is no acute infarct. No evidence of an intracranial mass. No chronic intracranial blood products. No extra-axial fluid collection. No midline shift. Vascular: Maintained flow voids within the proximal large arterial vessels. Skull and upper cervical spine: No focal suspicious marrow lesion. Sinuses/Orbits: No mass or acute finding within the imaged orbits. Mild mucosal thickening, and  small-volume fluid, within the bilateral ethmoid sinuses. Other: Trace fluid within the bilateral mastoid air cells. IMPRESSION: 1. Intermittently motion degraded examination. 2. No evidence of acute intracranial abnormality. 3. No specific seizure focus is identified. 4. Mild chronic small-vessel ischemic changes within the cerebral white matter and pons. 5. Mild cerebral atrophy. 6. Bilateral ethmoid sinusitis. 7. Trace bilateral mastoid effusions. Electronically Signed   By: Kellie Simmering D.O.   On: 01/15/2022 13:47   DG CHEST PORT 1 VIEW  Result Date: 01/14/2022 CLINICAL DATA:  Central line placement. EXAM: PORTABLE CHEST 1 VIEW COMPARISON:  January 14, 2022 FINDINGS: Right IJ approach central venous catheter terminates at the expected location of the distal superior vena cava. There is no evidence of pneumothorax. Endotracheal tube terminates 5.5 cm superior to the carina. Enteric catheter collimated off the image. Enlarged cardiac silhouette. Streaky airspace opacities in bilateral hilar regions. Possible small right pleural effusion. IMPRESSION: 1. Right IJ approach central venous catheter terminates at the expected location of the distal superior vena cava. No evidence of pneumothorax. 2. Endotracheal tube 5.5 cm superior to the carina. Advancement may be considered. 3. Streaky airspace opacities in bilateral hilar regions. Question developing interstitial pulmonary edema. Electronically Signed   By: Fidela Salisbury M.D.   On: 01/14/2022 18:57   ECHOCARDIOGRAM COMPLETE  Result Date: 01/14/2022    ECHOCARDIOGRAM REPORT   Patient Name:   Taylor [name] Date of Exam: 01/14/2022 Medical Rec #:  [id number]         Height:       73.0 in Accession #:    [id number]        Weight:       365.3 lb Date of Birth:  04-11-1956        BSA:          2.779 m Patient Age:    46 years          BP:           89/60 mmHg Patient Gender: M                 HR:           105 bpm. Exam Location:  Inpatient Procedure: 2D Echo,  Cardiac Doppler, Color Doppler and Intracardiac            Opacification Agent Indications:  I50.9* Heart failure (unspecified)  History:         Patient has prior history of Echocardiogram examinations, most                  recent 03/03/2021. CHF; Risk Factors:Hypertension and Diabetes.  Sonographer:     [place], RVT, RDCS Referring Phys:  [id number] Collier Bullock Diagnosing Phys: Oswaldo Milian MD IMPRESSIONS  1. Technically difficult study, very limited views even with contrast administration  2. Left ventricular ejection fraction, by estimation, is grossly 40 to 45%. The left ventricle has mildly decreased function. Left ventricular endocardial border not optimally defined to evaluate regional wall motion. Left ventricular diastolic parameters are indeterminate.  3. Right ventricule is not well visualized but grossly normal size and systolic function. There is normal pulmonary artery systolic pressure.  4. The mitral valve is grossly normal. Trivial mitral valve regurgitation.  5. The aortic valve was not well visualized. Aortic valve regurgitation is not visualized. No aortic stenosis is present.  6. The inferior vena cava is dilated in size with <50% respiratory variability, suggesting right atrial pressure of 15 mmHg. FINDINGS  Left Ventricle: Left ventricular ejection fraction, by estimation, is 40 to 45%. The left ventricle has mildly decreased function. Left ventricular endocardial border not optimally defined to evaluate regional wall motion. Definity contrast agent was given IV to delineate the left ventricular endocardial borders. The left ventricular internal cavity size was normal in size. There is no left ventricular hypertrophy. Left ventricular diastolic parameters are indeterminate. Right Ventricle: The right ventricular size is not well visualized. Right vetricular wall thickness was not well visualized. Right ventricular systolic function was not well visualized. There is  normal pulmonary artery systolic pressure. The tricuspid regurgitant velocity is 2.14 m/s, and with an assumed right atrial pressure of 15 mmHg, the estimated right ventricular systolic pressure is 63.8 mmHg. Left Atrium: Left atrial size was not well visualized. Right Atrium: Right atrial size was not well visualized. Pericardium: Trivial pericardial effusion is present. Mitral Valve: The mitral valve is grossly normal. Trivial mitral valve regurgitation. Tricuspid Valve: The tricuspid valve is normal in structure. Tricuspid valve regurgitation is trivial. Aortic Valve: The aortic valve was not well visualized. Aortic valve regurgitation is not visualized. No aortic stenosis is present. Pulmonic Valve: The pulmonic valve was not well visualized. Pulmonic valve regurgitation is not visualized. Aorta: The aortic root is normal in size and structure. Venous: The inferior vena cava is dilated in size with less than 50% respiratory variability, suggesting right atrial pressure of 15 mmHg. IAS/Shunts: The interatrial septum was not well visualized.  LEFT VENTRICLE PLAX 2D LVIDd:         5.40 cm   Diastology LVIDs:         4.00 cm   LV e' lateral: 13.10 cm/s LV PW:         0.80 cm LV IVS:        0.90 cm LVOT diam:     2.00 cm LV SV:         30 LV SV Index:   11 LVOT Area:     3.14 cm  LEFT ATRIUM         Index LA diam:    4.30 cm 1.55 cm/m  AORTIC VALVE LVOT Vmax:   58.80 cm/s LVOT Vmean:  38.200 cm/s LVOT VTI:    0.096 m  AORTA Ao Root diam: 3.00 cm TRICUSPID VALVE TR Peak grad:   18.3 mmHg TR Vmax:  214.00 cm/s  SHUNTS Systemic VTI:  0.10 m Systemic Diam: 2.00 cm Oswaldo Milian MD Electronically signed by Oswaldo Milian MD Signature Date/Time: 01/14/2022/6:56:43 PM    Final (Updated)    EEG adult  Result Date: 01/14/2022 Lora Havens, MD     01/14/2022  6:59 PM Patient Name: Taylor [name] MRN: [id number] Epilepsy Attending: Lora Havens Referring Physician/Provider: Mick Sell, PA-C  Date: 01/14/2022 Duration: 22.59 mins Patient history: 66yo M with witnessed seizure activity. EEG to evaluate for seizure Level of alertness:  lethargic AEDs during EEG study: Ativan Technical aspects: This EEG study was done with scalp electrodes positioned according to the 10-20 International system of electrode placement. Electrical activity was reviewed with band pass filter of 1-70Hz, sensitivity of 7 uV/mm, display speed of 20m/sec with a 60Hz notched filter applied as appropriate. EEG data were recorded continuously and digitally stored.  Video monitoring was available and reviewed as appropriate. Description: EEG showed continuous generalize 3 to 6 Hz theta-delta slowing. Hyperventilation and photic stimulation were not performed.   ABNORMALITY - Continuous slow, generalized IMPRESSION: This study is suggestive of severe diffuse encephalopathy, nonspecific etiology. No seizures or epileptiform discharges were seen throughout the recording. A normal interictal EEG does not exclude nor support the diagnosis of epilepsy. PLora Havens  DG Chest Port 1 View  Result Date: 01/14/2022 CLINICAL DATA:  Endotracheal tube.  OG tube EXAM: PORTABLE CHEST 1 VIEW COMPARISON:  Chest a 01/2022 FINDINGS: Endotracheal tube in good position. Gastric tube in the stomach with the tip in the body the stomach. Cardiac enlargement. Improvement in probable vascular congestion. Mild atelectasis in the bases and left perihilar region. No effusion. IMPRESSION: Endotracheal tube in good position.  NG tube in the body the stomach Improved vascular congestion.  Mild bilateral atelectasis. Electronically Signed   By: CFranchot GalloM.D.   On: 01/14/2022 17:34   CT CHEST ABDOMEN PELVIS WO CONTRAST  Result Date: 01/14/2022 CLINICAL DATA:  Nephrolithiasis with severe azotemia. Abnormal chest x-ray with perihilar opacities and low inspiration. EXAM: CT CHEST, ABDOMEN AND PELVIS WITHOUT CONTRAST TECHNIQUE: Multidetector CT imaging of  the chest, abdomen and pelvis was performed following the standard protocol without IV contrast. RADIATION DOSE REDUCTION: This exam was performed according to the departmental dose-optimization program which includes automated exposure control, adjustment of the mA and/or kV according to patient size and/or use of iterative reconstruction technique. COMPARISON:  Portable chest today, PA Lat chest 09/01/2012, and chest CT with contrast 03/09/2013 FINDINGS: CT CHEST FINDINGS Cardiovascular: Mild-to-moderate cardiomegaly is noted new from 2014. There are scattered three-vessel coronary artery calcifications. There is mild aortic atherosclerosis without aneurysm. Pulmonary veins and arteries are normal caliber. Mediastinum/Nodes: There are scattered shotty subcentimeter mediastinal lymph nodes, unchanged since 2014. No new or enlarging adenopathy is seen. Axillary spaces are clear. The lower poles of the thyroid gland, esophageal wall, and thoracic trachea unremarkable. Lungs/Pleura: There is no pleural effusion, thickening or pneumothorax. There is mild posterior atelectasis in the lower lobes. Mild bronchial thickening is noted in both lower lobes without evidence of bronchopneumonia, confluent consolidation or mass. There are calcified granulomas in the upper lobes. There is scattered mild subpleural reticulation in the bases. Musculoskeletal: There are degenerative changes of the thoracic spine. There are no acute or significant osseous findings. No significant chest wall findings. Dendritic gynecomastia is again noted. CT ABDOMEN PELVIS FINDINGS Hepatobiliary: Enlarged liver, 24 cm length with mild-to-moderate steatosis. No focal liver masses seen without contrast. Gallbladder is  mildly dilated, 12 cm in length. There is mild respiratory motion through the gallbladder but some images may suggest faint pericholecystic edema. There is no calcified stone, but early cholecystitis is not excluded. Pancreas: There is  diffuse fatty atrophy of the pancreas especially pronounced in the head, uncinate process and neck segments. No focal abnormality or inflammatory change is seen. Spleen: Mildly enlarged, 15.5 cm length, without contrast otherwise unremarkable. Adrenals/Urinary Tract: There is no adrenal mass or focal abnormality of the unenhanced renal cortex. No significant cortical thinning is seen. There is trace symmetric perinephric stranding. There are a few bilateral punctate nonobstructive caliceal stones but there is no obstructing stone or hydronephrosis. Both ureters are decompressed and clear. The bladder is contracted and not well seen but could be thickened. Stomach/Bowel: Mildly fluid distended stomach without wall thickening. The unopacified small and large bowel demonstrate no dilatation or wall thickening. The appendix is normal. There are uncomplicated colonic diverticula. Vascular/Lymphatic: Aortic atherosclerosis. No enlarged abdominal or pelvic lymph nodes. Reproductive: No prostatomegaly. Other: There is a tiny umbilical fat hernia. There are small bilateral inguinal fat hernias. There is no free air, free hemorrhage or free fluid Musculoskeletal: There is a unilateral right posterior fusion rod with pedicle screws at L4, L5 and S1 and solid fusions in between. There is facet hypertrophy in the lower lumbar segments. No acute or significant osseous findings. IMPRESSION: 1. Cardiomegaly without findings of CHF. 2. Aortic and coronary artery atherosclerosis. 3. Bronchial thickening in the lower lobes without evidence of bronchopneumonia, with mild scattered subpleural reticulation in the lung bases. 4. Dendritic gynecomastia. 5. Some images may suggest pericholecystic edema but there is motion artifact through the area limiting evaluation of the gallbladder. There is gallbladder distention without calcified stones. The wall is not well seen. Ultrasound may be helpful if there is clinical uncertainty as to  possible cholecystitis. 6. Mild hepatosplenomegaly and hepatic steatosis. 7. The unenhanced kidneys unremarkable except for nonobstructing micronephrolithiasis. 8. Cystitis versus bladder nondistention. 9. Diverticulosis without evidence of diverticulitis. 10. Umbilical and inguinal fat hernias and remaining findings described above. Electronically Signed   By: Telford Nab M.D.   On: 01/14/2022 02:25   CT Head Wo Contrast  Result Date: 01/14/2022 CLINICAL DATA:  Altered mental status.  Confusion. EXAM: CT HEAD WITHOUT CONTRAST TECHNIQUE: Contiguous axial images were obtained from the base of the skull through the vertex without intravenous contrast. RADIATION DOSE REDUCTION: This exam was performed according to the departmental dose-optimization program which includes automated exposure control, adjustment of the mA and/or kV according to patient size and/or use of iterative reconstruction technique. COMPARISON:  Remote head CT 06/08/2006 FINDINGS: Brain: No intracranial hemorrhage, mass effect, or midline shift. No hydrocephalus. The basilar cisterns are patent. No evidence of territorial infarct or acute ischemia. There is a prominent perivascular space in the left subinsular region. No extra-axial or intracranial fluid collection. Vascular: No hyperdense vessel or unexpected calcification. Skull: No fracture or focal lesion. Sinuses/Orbits: Paranasal sinuses and mastoid air cells are clear. The visualized orbits are unremarkable. Other: None. IMPRESSION: No acute intracranial abnormality. Electronically Signed   By: Keith Rake M.D.   On: 01/14/2022 00:20   DG Chest Portable 1 View  Result Date: 01/14/2022 CLINICAL DATA:  Altered mental status. Concern for pneumonia versus edema. EXAM: PORTABLE CHEST 1 VIEW COMPARISON:  Remote radiograph 09/01/2012 FINDINGS: Lung volumes are low. In conjunction with soft tissue attenuation from habitus evaluation is limited. The heart is mildly enlarged but likely  accentuated by  AP technique. Patchy perihilar bilateral lung opacities. No large pneumothorax or pleural effusion. No acute osseous findings. IMPRESSION: Low lung volumes with patchy perihilar bilateral lung opacities that may represent pulmonary edema or multifocal pneumonia. Electronically Signed   By: Keith Rake M.D.   On: 01/14/2022 00:14    Microbiology: Results for orders placed or performed during the hospital encounter of 01/13/22  Blood culture (routine x 2)     Status: None   Collection Time: 01/13/22 11:31 PM   Specimen: BLOOD RIGHT FOREARM  Result Value Ref Range Status   Specimen Description BLOOD RIGHT FOREARM  Final   Special Requests   Final    BOTTLES DRAWN AEROBIC AND ANAEROBIC Blood Culture adequate volume   Culture   Final    NO GROWTH 5 DAYS Performed at [facility], [address], [place], [place] [address]    Report Status 01/18/2022 FINAL  Final  Blood culture (routine x 2)     Status: None   Collection Time: 01/14/22 12:15 AM   Specimen: Left Antecubital; Blood  Result Value Ref Range Status   Specimen Description LEFT ANTECUBITAL  Final   Special Requests   Final    BOTTLES DRAWN AEROBIC AND ANAEROBIC Blood Culture adequate volume   Culture   Final    NO GROWTH 5 DAYS Performed at [facility], [address] [place], [place] [address]    Report Status 01/19/2022 FINAL  Final  SARS Coronavirus 2 by RT PCR (hospital order, performed in [facility] hospital lab) *cepheid single result test* Anterior Nasal Swab     Status: None   Collection Time: 01/14/22  5:21 AM   Specimen: Anterior Nasal Swab  Result Value Ref Range Status   SARS Coronavirus 2 by RT PCR NEGATIVE NEGATIVE Final    Comment: (NOTE) SARS-CoV-2 target nucleic acids are NOT DETECTED.  The SARS-CoV-2 RNA is generally detectable in upper and lower respiratory specimens during the acute phase of infection. The lowest concentration of SARS-CoV-2 viral copies this assay can detect is  250 copies / mL. A negative result does not preclude SARS-CoV-2 infection and should not be used as the sole basis for treatment or other patient management decisions.  A negative result may occur with improper specimen collection / handling, submission of specimen other than nasopharyngeal swab, presence of viral mutation(s) within the areas targeted by this assay, and inadequate number of viral copies (<250 copies / mL). A negative result must be combined with clinical observations, patient history, and epidemiological information.  Fact Sheet for Patients:   https://www.patel.info/  Fact Sheet for Healthcare Providers: https://hall.com/  This test is not yet approved or  cleared by the [place] FDA and has been authorized for detection and/or diagnosis of SARS-CoV-2 by FDA under an Emergency Use Authorization (EUA).  This EUA will remain in effect (meaning this test can be used) for the duration of the COVID-19 declaration under Section 564(b)(1) of the Act, 21 U.S.C. section 360bbb-3(b)(1), unless the authorization is terminated or revoked sooner.  Performed at [facility], [place] [address], [place],  [address]   MRSA Next Gen by PCR, Nasal     Status: None   Collection Time: 01/14/22  6:30 AM   Specimen: Nasal Mucosa; Nasal Swab  Result Value Ref Range Status   MRSA by PCR Next Gen NOT DETECTED NOT DETECTED Final    Comment: (NOTE) The GeneXpert MRSA Assay (FDA approved for NASAL specimens only), is one component of a  comprehensive MRSA colonization surveillance program. It is not intended to diagnose MRSA infection nor to guide or monitor treatment for MRSA infections. Test performance is not FDA approved in patients less than 92 years old. Performed at [facility], [place] [address] [place], [place] [address]   Culture, Respiratory w Gram Stain     Status: None   Collection Time: 01/14/22  5:02 PM    Specimen: Tracheal Aspirate; Respiratory  Result Value Ref Range Status   Specimen Description TRACHEAL ASPIRATE  Final   Special Requests NONE  Final   Gram Stain   Final    NO ORGANISMS SEEN MODERATE WBC PRESENT, PREDOMINANTLY PMN    Culture   Final    Normal respiratory flora-no Staph aureus or Pseudomonas seen Performed at [facility], [address] [place], [place] [address]    Report Status 01/17/2022 FINAL  Final    Labs: CBC: Recent Labs  Lab 01/20/22 0508 01/21/22 0425 01/22/22 0500 01/23/22 0349  WBC 11.7* 11.2* 10.0 8.9  HGB 13.3 14.6 15.2 12.9*  HCT 41.1 44.4 47.9 40.7  MCV 87.8 87.4 88.5 90.0  PLT 176 117* 153 364*   Basic Metabolic Panel: Recent Labs  Lab 01/21/22 0425 01/22/22 0500 01/23/22 0349 01/24/22 0231 01/25/22 0721  NA 141 142 142 144 140  K 4.2 3.4* 3.5 3.2* 3.4*  CL 103 101 105 103 104  CO2 _0 GLUCOSE 190* 140* 180* 121* 146*  BUN 51* 46* 35* 26* 18  CREATININE 2.06* 1.69* 1.55* 1.33* 1.36*  CALCIUM 9.0 9.3 8.7* 8.9 8.4*  MG 2.2 2.0 1.8 1.9 2.1   Liver Function Tests: No results for input(s): "AST", "ALT", "ALKPHOS", "BILITOT", "PROT", "ALBUMIN" in the last 168 hours. CBG: Recent Labs  Lab 01/25/22 1601 01/25/22 1935 01/25/22 2318 01/26/22 0323 01/26/22 0921  GLUCAP 170* 175* 153* 114* 145*    Discharge time spent: > 30 minutes.  Signed: Patrecia Pour, MD [facility] 01/26/2022

## 2022-01-26 NOTE — Progress Notes (Signed)
Progress Note  Patient Name: Taylor [name] Date of Encounter: 01/26/2022  [facility] Cardiologist: Debbe Odea, MD   Subjective   Patient denies complaints.  No chest pain or shortness of breath.  Inpatient Medications    Scheduled Meds:  apixaban  5 mg Oral BID   Chlorhexidine Gluconate Cloth  6 each Topical Q0600   docusate sodium  100 mg Oral BID   fiber supplement (BANATROL TF)  60 mL Oral BID   insulin aspart  0-20 Units Subcutaneous Q4H   insulin aspart  3 Units Subcutaneous TID WC   insulin glargine-yfgn  14 Units Subcutaneous QHS   lidocaine  1 patch Transdermal Q24H   melatonin  3 mg Oral QHS   metoprolol tartrate  200 mg Oral BID   multivitamin with minerals  1 tablet Oral Daily   OLANZapine  7.5 mg Oral QHS   oxyCODONE  10 mg Oral Q6H   polyethylene glycol  17 g Oral Daily   senna  1 tablet Oral Daily   tamsulosin  0.4 mg Oral Daily   torsemide  20 mg Oral BID   traZODone  100 mg Oral QHS   zolpidem  5 mg Oral QHS   Continuous Infusions:  sodium chloride 250 mL (01/19/22 1033)   PRN Meds: HYDROmorphone, ipratropium-albuterol, lip balm, ondansetron (ZOFRAN) IV, mouth rinse, traZODone   Vital Signs    Vitals:   01/25/22 2320 01/26/22 0324 01/26/22 0435 01/26/22 0758  BP: 107/75 127/84  133/85  Pulse: 93 72  77  Resp: 20 20  17   Temp: 97.8 F (36.6 C) 97.8 F (36.6 C)    TempSrc: Oral Oral    SpO2: 98% 98%    Weight:   (!) 155.9 kg   Height:        Intake/Output Summary (Last 24 hours) at 01/26/2022 0931 Last data filed at 01/26/2022 0500 Gross per 24 hour  Intake --  Output 1100 ml  Net -1100 ml      01/26/2022    4:35 AM 01/25/2022    3:54 AM 01/24/2022    3:28 AM  Last 3 Weights  Weight (lbs) 343 lb 11.2 oz 342 lb 13 oz 347 lb 14.2 oz  Weight (kg) 155.9 kg 155.5 kg 157.8 kg      Telemetry    Atrial fibrillation with ventricular response in the low 100 range with occasional aberrantly conducted beats- Personally  Reviewed  ECG    Not performed today- Personally Reviewed  Physical Exam   GEN: No acute distress.   Neck: No JVD Cardiac: Irregularly irregular, no murmurs, rubs, or gallops.  Respiratory: Clear to auscultation bilaterally. GI: Soft, nontender, non-distended  MS: No edema; No deformity. Neuro:  Nonfocal  Psych: Normal affect  Extremities-minimal pretibial edema  Labs    High Sensitivity Troponin:   Recent Labs  Lab 01/13/22 2331 01/14/22 0109  TROPONINIHS 20* 15     Chemistry Recent Labs  Lab 01/23/22 0349 01/24/22 0231 01/25/22 0721  NA 142 144 140  K 3.5 3.2* 3.4*  CL 105 103 104  CO2 27 29 29   GLUCOSE 180* 121* 146*  BUN 35* 26* 18  CREATININE 1.55* 1.33* 1.36*  CALCIUM 8.7* 8.9 8.4*  MG 1.8 1.9 2.1  GFRNONAA 50* 60* 58*  ANIONGAP 10 12 7     Lipids No results for input(s): "CHOL", "TRIG", "HDL", "LABVLDL", "LDLCALC", "CHOLHDL" in the last 168 hours.  Hematology Recent Labs  Lab 01/21/22 0425 01/22/22 0500 01/23/22 01/23/22  WBC 11.2* 10.0 8.9  RBC 5.08 5.41 4.52  HGB 14.6 15.2 12.9*  HCT 44.4 47.9 40.7  MCV 87.4 88.5 90.0  MCH 28.7 28.1 28.5  MCHC 32.9 31.7 31.7  RDW 16.3* 16.8* 16.2*  PLT 117* 153 137*   Thyroid No results for input(s): "TSH", "FREET4" in the last 168 hours.  BNP Recent Labs  Lab 01/20/22 0508  BNP 1,077.6*    DDimer No results for input(s): "DDIMER" in the last 168 hours.   Radiology    No results found.  Cardiac Studies   2D echocardiogram (01/14/2022)  IMPRESSIONS     1. Technically difficult study, very limited views even with contrast  administration   2. Left ventricular ejection fraction, by estimation, is grossly 40 to  45%. The left ventricle has mildly decreased function. Left ventricular  endocardial border not optimally defined to evaluate regional wall motion.  Left ventricular diastolic  parameters are indeterminate.   3. Right ventricule is not well visualized but grossly normal size and  systolic  function. There is normal pulmonary artery systolic pressure.   4. The mitral valve is grossly normal. Trivial mitral valve  regurgitation.   5. The aortic valve was not well visualized. Aortic valve regurgitation  is not visualized. No aortic stenosis is present.   6. The inferior vena cava is dilated in size with <50% respiratory  variability, suggesting right atrial pressure of 15 mmHg.   Patient Profile     66 y.o. male  with a hx of HTN CAD (60% mLAD, 30% D2), diabetes, permanent atrial fibrillation s/p failed DCCV, NICM last EF 35-40%, OSA, morbid obesity who is being seen 01/20/2022 for the evaluation of atrial fib at the request of Dr. Jarvis Newcomer.  Assessment & Plan    1: Permanent A-fib-rate controlled on high-dose beta-blocker and Eliquis.  Ventricular response in the low 100 range.  2: Acute on chronic systolic heart failure-EF in the 40 to 45% range with normal pulmonary pressures and trivial MR.  I/O- 14.3 L.  On torsemide 20 mg p.o. twice daily.  3: AKI-serum creatinine had peaked at 10.  Currently down to 1.36.  4: Essential hypertension-controlled on current medications (metoprolol).  From our point of view, patient probably stable for discharge.  We will arrange follow-up with Dr.Agbor- Etang in [place].  [facility] will sign off.   Medication Recommendations: None, can maintain torsemide and metoprolol as well as Eliquis. Other recommendations (labs, testing, etc): None Follow up as an outpatient: We will arrange outpatient follow-up.  TOC 7 then Dr. Loyal Jacobson n [place]  For questions or updates, please contact [facility] Please consult [email or web address] for contact info under        Signed, Nanetta Batty, MD  01/26/2022, 9:31 AM

## 2022-01-27 ENCOUNTER — Telehealth: Payer: Self-pay

## 2022-01-27 NOTE — Telephone Encounter (Signed)
Yes, he can have them. He needs to schedule office visit in 1 month.

## 2022-01-27 NOTE — Telephone Encounter (Unsigned)
Copied from CRM [id number]. Topic: General - Other >> Jan 27, 2022 12:07 PM [place] B wrote: Reason for FPO:[id number] called in about getting more samples of Farxiga 10 mg

## 2022-01-27 NOTE — Telephone Encounter (Signed)
Pt has workers comp- no tcm needed

## 2022-01-27 NOTE — Telephone Encounter (Signed)
We have 5 boxes. Okay for samples?

## 2022-01-27 NOTE — Telephone Encounter (Signed)
Opened chart in error.

## 2022-01-28 ENCOUNTER — Telehealth: Payer: Self-pay | Admitting: Family Medicine

## 2022-01-28 NOTE — Telephone Encounter (Signed)
Pt needs Home health orders asap / pt stated he is interested in [facility] / please call pt back and advise

## 2022-01-28 NOTE — Telephone Encounter (Signed)
Please Review

## 2022-01-28 NOTE — Telephone Encounter (Signed)
Pt states he thinks he is all set with [facility] and daughter, Inetta Fermo is handling everything for him.  Advised to call office with any questions.

## 2022-01-28 NOTE — Telephone Encounter (Signed)
Per d/c instruction it looks like was set up with [facility] and [facility]

## 2022-01-28 NOTE — Telephone Encounter (Signed)
Pt informed and follow up appt scheduled.  He will send daughter, Nanine Means for pick up.

## 2022-01-30 NOTE — Telephone Encounter (Signed)
Taylor [name] calling from [place] Calling to report a delay in the start of care until Monday. CB- [phone]

## 2022-01-30 NOTE — Telephone Encounter (Signed)
FYI

## 2022-02-02 ENCOUNTER — Telehealth: Payer: Self-pay

## 2022-02-02 DIAGNOSIS — I5023 Acute on chronic systolic (congestive) heart failure: Secondary | ICD-10-CM | POA: Diagnosis not present

## 2022-02-02 DIAGNOSIS — I110 Hypertensive heart disease with heart failure: Secondary | ICD-10-CM | POA: Diagnosis not present

## 2022-02-02 DIAGNOSIS — J9602 Acute respiratory failure with hypercapnia: Secondary | ICD-10-CM | POA: Diagnosis not present

## 2022-02-02 DIAGNOSIS — J9601 Acute respiratory failure with hypoxia: Secondary | ICD-10-CM | POA: Diagnosis not present

## 2022-02-02 DIAGNOSIS — I4821 Permanent atrial fibrillation: Secondary | ICD-10-CM | POA: Diagnosis not present

## 2022-02-02 NOTE — Telephone Encounter (Signed)
Tried calling patient. Left message to call back. OK for [facility] to schedule appt or route call to office for front office staff to schedule appointment.

## 2022-02-02 NOTE — Telephone Encounter (Signed)
Pt has called and tried to sch HFU in same day for this week as requested, PT states that his kidneys went out and then had heart issues. The fire dept had to help get him into the house/ He says in a week or two he may can try but cannot physically come in, FU re a phone appt. [phone]

## 2022-02-02 NOTE — Telephone Encounter (Signed)
-----   Message from Malva Limes, MD sent at 02/01/2022  8:29 AM EDT ----- Regarding: Hospital follow up This patient was discharged on 01/22/2022 and needs follow up office visit before the end of this week, but his follow up is not scheduled until 9-25. SameDay slots  can be used for hospital follow visits. Please see if he can come in this week for hospital follow up.

## 2022-02-03 ENCOUNTER — Telehealth: Payer: Self-pay | Admitting: Family Medicine

## 2022-02-03 NOTE — Telephone Encounter (Signed)
That's fine

## 2022-02-03 NOTE — Telephone Encounter (Signed)
Called pt to schedule appointment.  Left vm for pt to call back to schedule.  Okay for [facility] to schedule for next week per Dr. Sherrie Mustache in sameday slot.

## 2022-02-03 NOTE — Telephone Encounter (Signed)
Next week would be Ok.

## 2022-02-03 NOTE — Telephone Encounter (Signed)
Home Health Verbal Orders - Caller/Agency: Madelaine Etienne / [facility]  Callback Number: [phone] vm can be left  Requesting PT Frequency: start of care orders 2xs a week for 4 weeks

## 2022-02-03 NOTE — Telephone Encounter (Signed)
Advised 

## 2022-02-11 DIAGNOSIS — E876 Hypokalemia: Secondary | ICD-10-CM | POA: Diagnosis not present

## 2022-02-11 DIAGNOSIS — E119 Type 2 diabetes mellitus without complications: Secondary | ICD-10-CM | POA: Diagnosis not present

## 2022-02-11 DIAGNOSIS — G4733 Obstructive sleep apnea (adult) (pediatric): Secondary | ICD-10-CM | POA: Diagnosis not present

## 2022-02-11 DIAGNOSIS — J9601 Acute respiratory failure with hypoxia: Secondary | ICD-10-CM | POA: Diagnosis not present

## 2022-02-11 DIAGNOSIS — D649 Anemia, unspecified: Secondary | ICD-10-CM | POA: Diagnosis not present

## 2022-02-11 DIAGNOSIS — I4821 Permanent atrial fibrillation: Secondary | ICD-10-CM | POA: Diagnosis not present

## 2022-02-11 DIAGNOSIS — J9602 Acute respiratory failure with hypercapnia: Secondary | ICD-10-CM | POA: Diagnosis not present

## 2022-02-11 DIAGNOSIS — I5023 Acute on chronic systolic (congestive) heart failure: Secondary | ICD-10-CM | POA: Diagnosis not present

## 2022-02-11 DIAGNOSIS — I2510 Atherosclerotic heart disease of native coronary artery without angina pectoris: Secondary | ICD-10-CM | POA: Diagnosis not present

## 2022-02-11 DIAGNOSIS — I110 Hypertensive heart disease with heart failure: Secondary | ICD-10-CM | POA: Diagnosis not present

## 2022-02-16 NOTE — Progress Notes (Signed)
I,Tiffany J Bragg,acting as a scribe for Lelon Huh, MD.,have documented all relevant documentation on the behalf of Lelon Huh, MD,as directed by  Lelon Huh, MD while in the presence of Lelon Huh, MD.  Established patient visit   Patient: Taylor [name]   DOB: 10/29/1955   66 y.o. Male  MRN: [id number] Visit Date: 02/17/2022  Today's healthcare provider: Lelon Huh, MD   Chief Complaint  Patient presents with   Hospitalization Follow-up   Subjective    HPI  Follow up Hospitalization  Patient was admitted to [facility]  on 01/13/2022 and discharged on 01/26/2022. He was treated for disorientation, acute renal failure/ATN, and hypotension requiring intubation and vasopressors.  Creatinine on admission was over 9 and improved to 1.3 at time of discharge. His ACEI and Wilder Glade were discontinued, but eventually required restarting torsemide after onset of pulmonary edema.  Was started on tamsulosin for acute urinary retention.  Telephone follow up was not done. He reports excellent compliance with treatment. He reports this condition is improved. Has follow up with cardiology scheduled 02-27-2022.   ----------------------------------------------------------------------------------------- -   Medications: Outpatient Medications Prior to Visit  Medication Sig   apixaban (ELIQUIS) 5 MG TABS tablet TAKE 1 TABLET BY MOUTH TWICE DAILY   dapagliflozin propanediol (FARXIGA) 10 MG TABS tablet Take 1 tablet (10 mg total) by mouth daily before breakfast.   glipiZIDE (GLUCOTROL XL) 10 MG 24 hr tablet TAKE 1 TABLET BY MOUTH ONCE DAILY. (Patient taking differently: Take 10 mg by mouth daily.)   HYDROmorphone (DILAUDID) 4 MG tablet Take 4 mg by mouth See admin instructions. Brand name Dilaudid 4 mg - 4 mg 3-4 times daily (RX written for 4 times daily)   metFORMIN (GLUCOPHAGE) 1000 MG tablet TAKE 1 TABLET BY MOUTH TWICE DAILY (Patient taking differently: Take  1,000 mg by mouth 2 (two) times daily.)   metoprolol (TOPROL XL) 200 MG 24 hr tablet Take 1 tablet (200 mg total) by mouth daily.   NARCAN 4 MG/0.1ML LIQD nasal spray kit Place 0.4 mg into the nose as needed (accidental overdose).   OXYCONTIN 20 MG 12 hr tablet Take 1 tablet (20 mg total) by mouth every 12 (twelve) hours.   pioglitazone (ACTOS) 45 MG tablet TAKE 1 TABLET BY MOUTH ONCE DAILY.   potassium chloride SA (KLOR-CON M) 20 MEQ tablet Take 1 tablet (20 mEq total) by mouth daily.   pregabalin (LYRICA) 100 MG capsule Take 100 mg by mouth 3 (three) times daily.   simvastatin (ZOCOR) 40 MG tablet TAKE 1 TABLET BY MOUTH ONCE DAILY. (Patient taking differently: Take 40 mg by mouth daily.)   tamsulosin (FLOMAX) 0.4 MG CAPS capsule Take 1 capsule (0.4 mg total) by mouth daily.   torsemide (DEMADEX) 20 MG tablet Take 1 tablet (20 mg total) by mouth 2 (two) times daily.   zolpidem (AMBIEN CR) 12.5 MG CR tablet Take 12.5 mg by mouth at bedtime.    No facility-administered medications prior to visit.    Review of Systems  Constitutional:  Negative for appetite change, chills and fever.  Respiratory:  Negative for chest tightness, shortness of breath and wheezing.   Cardiovascular:  Negative for chest pain and palpitations.  Gastrointestinal:  Negative for abdominal pain, nausea and vomiting.       Objective    BP 133/83 (BP Location: Right Arm, Patient Position: Sitting, Cuff Size: Large)   Pulse (!) 105   Resp 16   Ht 6' (1.829 m)  Wt (!) 356 lb (161.5 kg)   SpO2 96%   BMI 48.28 kg/m    Physical Exam   General: Appearance:    Obese male in no acute distress  Eyes:    PERRL, conjunctiva/corneas clear, EOM's intact       Lungs:     Clear to auscultation bilaterally, respirations unlabored  Heart:    Tachycardic. Irregularly irregular rhythm. No murmurs, rubs, or gallops.    MS:   All extremities are intact.    Neurologic:   Awake, alert, oriented x 3. No apparent focal  neurological defect.         Assessment & Plan     1. Acute renal failure, unspecified acute renal failure type ([place]) Unclear etiology, although he did have significant urinary retention  now on tamsulosin. No prostatomegaly was seen on CT from recent hospitalation.  - CBC - Comprehensive metabolic panel  2. Type 2 diabetes mellitus with diabetic neuropathy, unspecified whether long term insulin use ([place]) He had been lost to follow since September last year when A1c was 6.6, but A1c in hospital last month was 9.9.  Is now back on maintenance medications.   Will follow up in about a month to check on A1c  3. HFrEF (heart failure with reduced ejection fraction) ([place]) Currently appears euvolemic. Is to follow up with cardiology later this month as scheduled.   4. Need for influenza vaccination  - Flu Vaccine QUAD 6+ mos PF IM (Fluarix Quad PF)   Future Appointments  Date Time Provider [place]  03/30/2022 10:00 AM Caryn Section, Kirstie Peri, MD [facility]    Addressed extensive list of chronic and acute medical problems today requiring 45 minutes reviewing his medical record, counseling patient regarding his conditions and coordination of care.        The entirety of the information documented in the History of Present Illness, Review of Systems and Physical Exam were personally obtained by me. Portions of this information were initially documented by the CMA and reviewed by me for thoroughness and accuracy.        Lelon Huh, MD  [facility] [phone] (phone) [phone] (fax)  [place]

## 2022-02-17 ENCOUNTER — Ambulatory Visit (INDEPENDENT_AMBULATORY_CARE_PROVIDER_SITE_OTHER): Payer: Medicare Other | Admitting: Family Medicine

## 2022-02-17 ENCOUNTER — Encounter: Payer: Self-pay | Admitting: Family Medicine

## 2022-02-17 VITALS — BP 133/83 | HR 105 | Resp 16 | Ht 72.0 in | Wt 356.0 lb

## 2022-02-17 DIAGNOSIS — I5020 Unspecified systolic (congestive) heart failure: Secondary | ICD-10-CM | POA: Diagnosis not present

## 2022-02-17 DIAGNOSIS — Z23 Encounter for immunization: Secondary | ICD-10-CM | POA: Diagnosis not present

## 2022-02-17 DIAGNOSIS — N179 Acute kidney failure, unspecified: Secondary | ICD-10-CM

## 2022-02-17 DIAGNOSIS — E1140 Type 2 diabetes mellitus with diabetic neuropathy, unspecified: Secondary | ICD-10-CM | POA: Diagnosis not present

## 2022-02-17 DIAGNOSIS — F1190 Opioid use, unspecified, uncomplicated: Secondary | ICD-10-CM

## 2022-02-17 NOTE — Patient Instructions (Signed)
.   Please review the attached list of medications and notify my office if there are any errors.   . Please bring all of your medications to every appointment so we can make sure that our medication list is the same as yours.   

## 2022-02-18 DIAGNOSIS — I5023 Acute on chronic systolic (congestive) heart failure: Secondary | ICD-10-CM | POA: Diagnosis not present

## 2022-02-18 DIAGNOSIS — J9602 Acute respiratory failure with hypercapnia: Secondary | ICD-10-CM | POA: Diagnosis not present

## 2022-02-18 DIAGNOSIS — I110 Hypertensive heart disease with heart failure: Secondary | ICD-10-CM | POA: Diagnosis not present

## 2022-02-18 DIAGNOSIS — J9601 Acute respiratory failure with hypoxia: Secondary | ICD-10-CM | POA: Diagnosis not present

## 2022-02-18 DIAGNOSIS — I4821 Permanent atrial fibrillation: Secondary | ICD-10-CM | POA: Diagnosis not present

## 2022-02-18 LAB — COMPREHENSIVE METABOLIC PANEL
ALT: 17 IU/L (ref 0–44)
AST: 20 IU/L (ref 0–40)
Albumin/Globulin Ratio: 1.2 (ref 1.2–2.2)
Albumin: 3.8 g/dL — ABNORMAL LOW (ref 3.9–4.9)
Alkaline Phosphatase: 106 IU/L (ref 44–121)
BUN/Creatinine Ratio: 10 (ref 10–24)
BUN: 11 mg/dL (ref 8–27)
Bilirubin Total: 0.6 mg/dL (ref 0.0–1.2)
CO2: 22 mmol/L (ref 20–29)
Calcium: 9.4 mg/dL (ref 8.6–10.2)
Chloride: 96 mmol/L (ref 96–106)
Creatinine, Ser: 1.07 mg/dL (ref 0.76–1.27)
Globulin, Total: 3.3 g/dL (ref 1.5–4.5)
Glucose: 232 mg/dL — ABNORMAL HIGH (ref 70–99)
Potassium: 4 mmol/L (ref 3.5–5.2)
Sodium: 137 mmol/L (ref 134–144)
Total Protein: 7.1 g/dL (ref 6.0–8.5)
eGFR: 77 mL/min/{1.73_m2} (ref >59)

## 2022-02-18 LAB — CBC
Hematocrit: 40.9 % (ref 37.5–51.0)
Hemoglobin: 12.7 g/dL — ABNORMAL LOW (ref 13.0–17.7)
MCH: 27.1 pg (ref 26.6–33.0)
MCHC: 31.1 g/dL — ABNORMAL LOW (ref 31.5–35.7)
MCV: 87 fL (ref 79–97)
Platelets: 195 10*3/uL (ref 150–450)
RBC: 4.69 x10E6/uL (ref 4.14–5.80)
RDW: 15 % (ref 11.6–15.4)
WBC: 8.4 10*3/uL (ref 3.4–10.8)

## 2022-02-20 DIAGNOSIS — I4821 Permanent atrial fibrillation: Secondary | ICD-10-CM | POA: Diagnosis not present

## 2022-02-20 DIAGNOSIS — I5023 Acute on chronic systolic (congestive) heart failure: Secondary | ICD-10-CM | POA: Diagnosis not present

## 2022-02-20 DIAGNOSIS — I110 Hypertensive heart disease with heart failure: Secondary | ICD-10-CM | POA: Diagnosis not present

## 2022-02-20 DIAGNOSIS — J9602 Acute respiratory failure with hypercapnia: Secondary | ICD-10-CM | POA: Diagnosis not present

## 2022-02-20 DIAGNOSIS — J9601 Acute respiratory failure with hypoxia: Secondary | ICD-10-CM | POA: Diagnosis not present

## 2022-02-23 DIAGNOSIS — I5023 Acute on chronic systolic (congestive) heart failure: Secondary | ICD-10-CM | POA: Diagnosis not present

## 2022-02-23 DIAGNOSIS — J9602 Acute respiratory failure with hypercapnia: Secondary | ICD-10-CM | POA: Diagnosis not present

## 2022-02-23 DIAGNOSIS — J9601 Acute respiratory failure with hypoxia: Secondary | ICD-10-CM | POA: Diagnosis not present

## 2022-02-23 DIAGNOSIS — I110 Hypertensive heart disease with heart failure: Secondary | ICD-10-CM | POA: Diagnosis not present

## 2022-02-23 DIAGNOSIS — I4821 Permanent atrial fibrillation: Secondary | ICD-10-CM | POA: Diagnosis not present

## 2022-02-25 DIAGNOSIS — J9601 Acute respiratory failure with hypoxia: Secondary | ICD-10-CM | POA: Diagnosis not present

## 2022-02-25 DIAGNOSIS — I110 Hypertensive heart disease with heart failure: Secondary | ICD-10-CM | POA: Diagnosis not present

## 2022-02-25 DIAGNOSIS — I5023 Acute on chronic systolic (congestive) heart failure: Secondary | ICD-10-CM | POA: Diagnosis not present

## 2022-02-25 DIAGNOSIS — J9602 Acute respiratory failure with hypercapnia: Secondary | ICD-10-CM | POA: Diagnosis not present

## 2022-02-25 DIAGNOSIS — I4821 Permanent atrial fibrillation: Secondary | ICD-10-CM | POA: Diagnosis not present

## 2022-02-25 NOTE — Progress Notes (Signed)
Cardiology Office Note    Date:  02/27/2022   ID:  Ygnacio, Fecteau 1956/04/18, MRN [id number]  PCP:  Birdie Sons, MD  Cardiologist:  Kate Sable, MD  Electrophysiologist:  Vickie Epley, MD   Chief Complaint: Follow-up  History of Present Illness:   GAVAN NORDBY is a 66 y.o. male with history of nonobstructive CAD, HFrEF secondary to NICM, permanent A-fib status post failed DCCV in 08/2020 with repeat successful DCCV in 02/2021, DM2, HTN, obesity, and OSA who presents for follow-up of his cardiomyopathy.  He established care with Dr. Garen Lah in 10/2019 for evaluation of dyspnea.  He was noted to be in A-fib at that time and started on apixaban.  Echo in 12/2019 showed an EF of 30 to 35%, mildly dilated LV internal cavity size, mild LVH, indeterminate LV diastolic function parameters, mildly reduced RV systolic function and ventricular cavity size, moderately elevated PASP, mildly dilated left atrium, moderately dilated right atrium, trivial mitral regurgitation, borderline dilatation of the ascending aorta measuring 36 mm, and an estimated right atrial pressure of 15 mmHg.  Cardiac cath in 03/2020 showed borderline 60% stenosis in the mid LAD with mild diffuse disease affecting the LCx and RCA.  Moderately elevated LVEDP estimated at 22 mmHg.  Medical therapy was recommended.  Follow-up echo in 12/2020 demonstrated an EF of 35 to 40%, mildly dilated internal LV cavity size, normal RV systolic function and ventricular cavity size, moderately dilated left atrium, no significant valvular abnormalities, and an estimated right atrial pressure of 8 mmHg.  DCCV was unsuccessful in 08/2020.  Given this, he was evaluated by EP with patient preference to work on lifestyle modification prior to initiating antiarrhythmic therapy.  Dofetilide was not felt to be a good option given prior medication adherence concerns.  He was initiated on amiodarone and subsequently underwent successful  TEE guided DCCV in 02/2021.  TEE at that time showed an EF of 35 to 40%, global hypokinesis, mildly dilated LV internal cavity size, mildly reduced RV systolic function with normal ventricular cavity size, no evidence of left atrial or left atrial appendage thrombus, mild mitral regurgitation, and moderate tricuspid regurgitation.  He was noted to be back in A-fib on follow-up in 03/2021 with plans to pursue rate control strategy.  He was last seen in our office in 07/2021 with intermittent lower extremity swelling.  Toprol was titrated to 200 mg daily.  He was admitted to the hospital in 01/2022 with acute hypoxic respiratory failure requiring mechanical ventilation with altered mental status in the setting of acute renal failure with a presenting serum creatinine of 9.94 up from baseline of 1.04 requiring CRRT due to ATN in the context of polypharmacy as well as, acute on chronic HFrEF in the context of IV fluid hydration, A-fib with RVR, possible generalized tonic-clonic seizure with normal EEG and MRI of the brain.  BNP 1077.  Initial and peak high-sensitivity troponin 20.  Echo demonstrated an EF of 40 to 45%, poorly visualized RV with grossly normal size and systolic function, normal PASP, trivial mitral regurgitation, and an estimated right atrial pressure of 15 mmHg.  At time of discharge, ventricular rates were controlled, volume status improved with a net -14.3 L, and renal function had improved to 1.36.  He comes in doing very well from a cardiac perspective and is without symptoms of angina or decompensation.  He notes chronic stable dyspnea.  No palpitations, dizziness, recent to be, or syncope.  When compared to his  last clinic visit in 07/2021, his weight is down 23 pounds.  He attributes this to his recent hospitalization.  No falls or symptoms concerning for bleeding.  He remains adherent and is tolerating cardiac medications.  He is no longer adding salt to food.  He is drinking less than 2 L of  liquid per day.  He is out of some of his medications, though indicates he is picking refills up today.  He has stable lower extremity swelling and 3 pillow orthopnea.   Labs independently reviewed: 02/2022 - BUN 11, serum creatinine 1.07, potassium 4.0, albumin 3.8, AST/ALT normal, Hgb 12.7, PLT 195 01/2022 - magnesium 2.1, TSH normal, A1c 9.9 10/2020 - TC 177, TG 164, HDL 46, LDL 102  Past Medical History:  Diagnosis Date   Arthritis    Broken leg 02/07/2019   CAD (coronary artery disease)    Depression    Diabetes mellitus without complication ([place])    Hypertension    NICM (nonischemic cardiomyopathy) ([place])     Past Surgical History:  Procedure Laterality Date   BACK SURGERY     CARDIOVERSION N/A 08/14/2020   Procedure: CARDIOVERSION;  Surgeon: Kate Sable, MD;  Location: [facility] ORS;  Service: Cardiovascular;  Laterality: N/A;   CARDIOVERSION N/A 03/03/2021   Procedure: CARDIOVERSION (CATH LAB);  Surgeon: Vickie Epley, MD;  Location: [place] [facility];  Service: Cardiovascular;  Laterality: N/A;   LEFT HEART CATH AND CORONARY ANGIOGRAPHY Left 04/05/2020   Procedure: LEFT HEART CATH AND CORONARY ANGIOGRAPHY;  Surgeon: Wellington Hampshire, MD;  Location: [place] [facility];  Service: Cardiovascular;  Laterality: Left;   TEE WITHOUT CARDIOVERSION N/A 03/03/2021   Procedure: TRANSESOPHAGEAL ECHOCARDIOGRAM (TEE);  Surgeon: Vickie Epley, MD;  Location: [place] [facility];  Service: Cardiovascular;  Laterality: N/A;    Current Medications: Current Meds  Medication Sig   apixaban (ELIQUIS) 5 MG TABS tablet TAKE 1 TABLET BY MOUTH TWICE DAILY   glipiZIDE (GLUCOTROL XL) 10 MG 24 hr tablet TAKE 1 TABLET BY MOUTH ONCE DAILY.   HYDROmorphone (DILAUDID) 4 MG tablet Take 4 mg by mouth See admin instructions. Brand name Dilaudid 4 mg - 4 mg 3-4 times daily (RX written for 4 times daily)   losartan (COZAAR) 25 MG tablet Take 1 tablet (25 mg total) by mouth daily.   metFORMIN  (GLUCOPHAGE) 1000 MG tablet TAKE 1 TABLET BY MOUTH TWICE DAILY   metoprolol (TOPROL XL) 200 MG 24 hr tablet Take 1 tablet (200 mg total) by mouth daily.   NARCAN 4 MG/0.1ML LIQD nasal spray kit Place 0.4 mg into the nose as needed (accidental overdose).   OXYCONTIN 20 MG 12 hr tablet Take 1 tablet (20 mg total) by mouth every 12 (twelve) hours. (Patient taking differently: Take 20 mg by mouth 4 (four) times daily.)   simvastatin (ZOCOR) 40 MG tablet TAKE 1 TABLET BY MOUTH ONCE DAILY.   torsemide (DEMADEX) 20 MG tablet Take 1 tablet (20 mg total) by mouth 2 (two) times daily.   zolpidem (AMBIEN CR) 12.5 MG CR tablet Take 12.5 mg by mouth at bedtime.     Allergies:   Other   Social History   Socioeconomic History   Marital status: Single    Spouse name: Not on file   Number of children: 1   Years of education: Not on file   Highest education level: Some college, no degree  Occupational History   Occupation: disability/retired  Tobacco Use   Smoking status: Never  Smokeless tobacco: Never  Vaping Use   Vaping Use: Never used  Substance and Sexual Activity   Alcohol use: No    Alcohol/week: 0.0 standard drinks of alcohol   Drug use: No   Sexual activity: Not on file  Other Topics Concern   Not on file  Social History Narrative   Lives with Mother [name].    Social Determinants of Health   Financial Resource Strain: Not on file  Food Insecurity: Not on file  Transportation Needs: Not on file  Physical Activity: Not on file  Stress: Not on file  Social Connections: Not on file     Family History:  The patient's family history includes Diabetes in his mother and sister.  ROS:   12-point review of systems is negative unless otherwise noted in the HPI.   EKGs/Labs/Other Studies Reviewed:    Studies reviewed were summarized above. The additional studies were reviewed today:  2D echo 01/14/2022: 1. Technically difficult study, very limited views even with contrast   administration   2. Left ventricular ejection fraction, by estimation, is grossly 40 to  45%. The left ventricle has mildly decreased function. Left ventricular  endocardial border not optimally defined to evaluate regional wall motion.  Left ventricular diastolic  parameters are indeterminate.   3. Right ventricule is not well visualized but grossly normal size and  systolic function. There is normal pulmonary artery systolic pressure.   4. The mitral valve is grossly normal. Trivial mitral valve  regurgitation.   5. The aortic valve was not well visualized. Aortic valve regurgitation  is not visualized. No aortic stenosis is present.   6. The inferior vena cava is dilated in size with <50% respiratory  variability, suggesting right atrial pressure of 15 mmHg. __________  TEE 03/03/2021: 1. Left ventricular ejection fraction, by estimation, is 35 to 40%. The  left ventricle has moderately decreased function. The left ventricle  demonstrates global hypokinesis. The left ventricular internal cavity size  was mildly dilated.   2. Right ventricular systolic function is mildly reduced. The right  ventricular size is normal.   3. No left atrial/left atrial appendage thrombus was detected. The LAA  emptying velocity was 24 cm/s.   4. The mitral valve is normal in structure. Mild mitral valve  regurgitation. No evidence of mitral stenosis.   5. Tricuspid valve regurgitation is moderate.   6. The aortic valve is tricuspid. Aortic valve regurgitation is not  visualized. No aortic stenosis is present.   Conclusion(s)/Recommendation(s): No evidence of thrombus. Proceeded to  cardioversion by Dr. Quentin Ore.  __________  2D echo 12/16/2020: 1. Left ventricular ejection fraction, by estimation, is 35 to 40%. The  left ventricle has moderately decreased function. Left ventricular  endocardial border not optimally defined to evaluate regional wall motion.  The left ventricular internal cavity   size was mildly dilated. Left ventricular diastolic parameters are  indeterminate.   2. Right ventricular systolic function is normal. The right ventricular  size is normal. Tricuspid regurgitation signal is inadequate for assessing  PA pressure.   3. Left atrial size was moderately dilated.   4. The mitral valve is normal in structure. No evidence of mitral valve  regurgitation. No evidence of mitral stenosis.   5. The aortic valve is normal in structure. Aortic valve regurgitation is  not visualized. No aortic stenosis is present.   6. The inferior vena cava is normal in size with <50% respiratory  variability, suggesting right atrial pressure of 8 mmHg.  7. Challenging image quality.   Comparison(s): Previous study was TDS. LV EF was 30-35%, mild LAE,  moderate RAE. __________  LHC 04/05/2020: Mid LAD lesion is 60% stenosed. 2nd Diag lesion is 30% stenosed.   1.  Borderline 60% stenosis in the mid left anterior descending artery.  Mild diffuse disease affecting the left circumflex and right coronary artery. 2.  Left ventricular angiography was not performed.  EF was moderately reduced by echo. 3.  Moderately elevated left ventricular end-diastolic pressure at 22 mmHg.   Recommendations: Recommend aggressive medical therapy. Resume Eliquis tomorrow. Consider cardioversion in 3 to 4 weeks given persistent tachycardia. If the patient has persistent angina symptoms after that in spite of medical therapy, FFR guided revascularization of the LAD can be considered. __________  2D echo 12/12/2019: 1. Left ventricular ejection fraction, by estimation, is 30 to 35%. The  left ventricle has moderate to severely decreased function. Left  ventricular endocardial border not optimally defined to evaluate regional  wall motion. The left ventricular internal   cavity size was mildly dilated. There is mild left ventricular  hypertrophy. Left ventricular diastolic parameters are  indeterminate.   2. Right ventricular systolic function is mildly reduced. The right  ventricular size is normal. There is moderately elevated pulmonary artery  systolic pressure.   3. Left atrial size was mildly dilated.   4. Right atrial size was moderately dilated.   5. The mitral valve was not well visualized. Trivial mitral valve  regurgitation. No evidence of mitral stenosis.   6. The aortic valve was not well visualized. Aortic valve regurgitation  is not visualized. No aortic stenosis is present.   7. Aortic dilatation noted. There is borderline dilatation of the  ascending aorta measuring 36 mm.   8. The inferior vena cava is dilated in size with <50% respiratory  variability, suggesting right atrial pressure of 15 mmHg.   EKG:  EKG is ordered today.  The EKG ordered today demonstrates A-fib with RVR and aberrancy, 106 bpm, poor R wave progression along the precordial leads, left axis deviation, nonspecific ST-T changes  Recent Labs: 01/14/2022: TSH 3.762 01/20/2022: B Natriuretic Peptide 1,077.6 01/25/2022: Magnesium 2.1 02/17/2022: ALT 17; BUN 11; Creatinine, Ser 1.07; Hemoglobin 12.7; Platelets 195; Potassium 4.0; Sodium 137  Recent Lipid Panel    Component Value Date/Time   CHOL 177 10/16/2020 1046   TRIG 117 01/16/2022 0358   HDL 46 10/16/2020 1046   CHOLHDL 3.8 10/16/2020 1046   LDLCALC 102 (H) 10/16/2020 1046    PHYSICAL EXAM:    VS:  BP 120/70 (BP Location: Left Arm, Patient Position: Sitting, Cuff Size: Large)   Pulse (!) 106   Ht 6' (1.829 m)   Wt (!) 362 lb (164.2 kg)   SpO2 93%   BMI 49.10 kg/m   BMI: Body mass index is 49.1 kg/m.  Physical Exam Vitals reviewed.  Constitutional:      Appearance: He is well-developed.  HENT:     Head: Normocephalic and atraumatic.  Eyes:     General:        Right eye: No discharge.        Left eye: No discharge.  Neck:     Vascular: No JVD.  Cardiovascular:     Rate and Rhythm: Normal rate. Rhythm irregularly  irregular.     Heart sounds: Normal heart sounds, S1 normal and S2 normal. Heart sounds not distant. No midsystolic click and no opening snap. No murmur heard.    No friction  rub.  Pulmonary:     Effort: Pulmonary effort is normal. No respiratory distress.     Breath sounds: Normal breath sounds. No decreased breath sounds, wheezing or rales.  Chest:     Chest wall: No tenderness.  Abdominal:     General: There is no distension.  Musculoskeletal:     Cervical back: Normal range of motion.     Right lower leg: Edema present.     Left lower leg: Edema present.     Comments: Mild bilateral lower extremity swelling  Skin:    General: Skin is warm and dry.     Nails: There is no clubbing.  Neurological:     Mental Status: He is alert and oriented to person, place, and time.  Psychiatric:        Speech: Speech normal.        Behavior: Behavior normal.        Thought Content: Thought content normal.        Judgment: Judgment normal.     Wt Readings from Last 3 Encounters:  02/27/22 (!) 362 lb (164.2 kg)  02/17/22 (!) 356 lb (161.5 kg)  01/26/22 (!) 343 lb 11.2 oz (155.9 kg)     ASSESSMENT & PLAN:   HFrEF secondary to NICM: He appears euvolemic and well compensated.  NYHA class II-III symptoms.  His weight is down 23 pounds today when compared to his last clinic visit, likely in the context of his recent hospital admission.  Add losartan 25 mg twice daily with continuation of Toprol-XL 200 mg daily.  Continue to escalate GDMT as able in follow-up.  Most recent echo from last month showed a slight improvement in his LV systolic function with an EF up to 45%.  CHF education.  Permanent A-fib: Ventricular rate reasonably controlled on recheck.  He remains on Toprol-XL 200 mg daily.  CHA2DS2-VASc at least 4 (CHF, HTN, DM, vascular disease).  He remains on apixaban 5 mg twice daily without symptoms concerning for bleeding, and does not meet reduced dosing criteria.  Recent stable  CBC.  Nonobstructive CAD: No symptoms concerning for angina.  Continue risk factor modification and current medical therapy including apixaban and place of aspirin to minimize bleeding risk given permanent A-fib along with Toprol-XL and simvastatin.  Hyperlipidemia managed by PCP.  Could consider transitioning to high intensity statin.  HTN: Blood pressure is well controlled in the office today.  Continue medical therapy as outlined above.  Morbid obesity with OSA: Weight loss and CPAP adherence recommended.  AKI: Resolved.  Follow-up BMP 1 week after initiating ARB.   Disposition: F/u with Dr. Garen Lah or an APP in 4 months.   Medication Adjustments/Labs and Tests Ordered: Current medicines are reviewed at length with the patient today.  Concerns regarding medicines are outlined above. Medication changes, Labs and Tests ordered today are summarized above and listed in the Patient Instructions accessible in Encounters.   Signed, Christell Faith, PA-C 02/27/2022 1:03 PM     [facility] [place], [place] [address] [phone]

## 2022-02-26 ENCOUNTER — Telehealth: Payer: Self-pay | Admitting: Family Medicine

## 2022-02-26 NOTE — Telephone Encounter (Signed)
Copied from [place] [id number]. Topic: Medicare AWV >> Feb 26, 2022  1:07 PM Jae Dire wrote: Reason for CRM:  Left message for patient to call back and schedule Medicare Annual Wellness Visit (AWV) in office.   If unable to come into the office for AWV,  please offer to do virtually or by telephone.  Last AWV: 10/16/2020  Please schedule at anytime with [facility] Health Advisor.  30 minute appointment for Virtual or phone 45 minute appointment for in office or Initial virtual/phone  Any questions, please contact me at [phone]

## 2022-02-27 ENCOUNTER — Other Ambulatory Visit: Payer: Self-pay

## 2022-02-27 ENCOUNTER — Encounter: Payer: Self-pay | Admitting: Physician Assistant

## 2022-02-27 ENCOUNTER — Ambulatory Visit: Payer: Medicare Other | Attending: Physician Assistant | Admitting: Physician Assistant

## 2022-02-27 VITALS — BP 120/70 | HR 106 | Ht 72.0 in | Wt 362.0 lb

## 2022-02-27 DIAGNOSIS — I428 Other cardiomyopathies: Secondary | ICD-10-CM

## 2022-02-27 DIAGNOSIS — I4821 Permanent atrial fibrillation: Secondary | ICD-10-CM | POA: Diagnosis not present

## 2022-02-27 DIAGNOSIS — N179 Acute kidney failure, unspecified: Secondary | ICD-10-CM | POA: Diagnosis not present

## 2022-02-27 DIAGNOSIS — I2510 Atherosclerotic heart disease of native coronary artery without angina pectoris: Secondary | ICD-10-CM

## 2022-02-27 DIAGNOSIS — Z79899 Other long term (current) drug therapy: Secondary | ICD-10-CM | POA: Diagnosis not present

## 2022-02-27 DIAGNOSIS — Z6841 Body Mass Index (BMI) 40.0 and over, adult: Secondary | ICD-10-CM

## 2022-02-27 DIAGNOSIS — G4733 Obstructive sleep apnea (adult) (pediatric): Secondary | ICD-10-CM

## 2022-02-27 DIAGNOSIS — I10 Essential (primary) hypertension: Secondary | ICD-10-CM | POA: Diagnosis not present

## 2022-02-27 DIAGNOSIS — I5020 Unspecified systolic (congestive) heart failure: Secondary | ICD-10-CM

## 2022-02-27 MED ORDER — LOSARTAN POTASSIUM 25 MG PO TABS: 25.0000 mg | ORAL_TABLET | Freq: Every day | ORAL | 3 refills | Status: DC

## 2022-02-27 NOTE — Telephone Encounter (Signed)
Patient came by the office to pick up samples of Farxiga. Samples were given to patient (permission given from Dr. Caryn Section on recent lab note).   Patient then mentioned that during his recent hospital discharge, he was started on a medication but wasn't given any refills. I reviewed hospital notes with patient and patient believes the medication is Flomax. Patient states he was told that Dr. Caryn Section would provide refills. Patient is requesting refills on Flomax for urinary retention. Please advise on request.    Pharmacy: [facility]

## 2022-02-27 NOTE — Patient Instructions (Signed)
Medication Instructions:  Your physician has recommended you make the following change in your medication:   START Losartan 25 mg once daily  *If you need a refill on your cardiac medications before your next appointment, please call your pharmacy*   Lab Work: BMET in 1 week over at the [employer] at [facility] and go to the 1st desk on the right to check in (REGISTRATION). No appointment is needed.   Lab hours: Monday- Friday (7:30 am- 5:30 pm)   If you have labs (blood work) drawn today and your tests are completely normal, you will receive your results only by: [place] (if you have MyChart) OR A paper copy in the mail If you have any lab test that is abnormal or we need to change your treatment, we will call you to review the results.   Testing/Procedures: None   Follow-Up: At [facility], you and your health needs are our priority.  As part of our continuing mission to provide you with exceptional heart care, we have created designated Provider Care Teams.  These Care Teams include your primary Cardiologist (physician) and Advanced Practice Providers (APPs -  Physician Assistants and Nurse Practitioners) who all work together to provide you with the care you need, when you need it.  We recommend signing up for the patient portal called "MyChart".  Sign up information is provided on this After Visit Summary.  MyChart is used to connect with patients for Virtual Visits (Telemedicine).  Patients are able to view lab/test results, encounter notes, upcoming appointments, etc.  Non-urgent messages can be sent to your provider as well.   To learn more about what you can do with MyChart, go to [email or web address].    Your next appointment:   4 month(s)  The format for your next appointment:   In Person  Provider:   Kate Sable, MD or Christell Faith, PA-C       Important Information About Sugar

## 2022-02-28 MED ORDER — TAMSULOSIN HCL 0.4 MG PO CAPS: 0.4000 mg | ORAL_CAPSULE | Freq: Every day | ORAL | 1 refills | Status: DC

## 2022-03-02 ENCOUNTER — Ambulatory Visit: Payer: Medicare Other | Admitting: Family Medicine

## 2022-03-02 ENCOUNTER — Other Ambulatory Visit: Payer: Self-pay

## 2022-03-02 MED ORDER — METOPROLOL SUCCINATE ER 200 MG PO TB24: 200.0000 mg | ORAL_TABLET | Freq: Every day | ORAL | 5 refills | Status: DC

## 2022-03-24 ENCOUNTER — Telehealth: Payer: Self-pay

## 2022-03-24 DIAGNOSIS — E1140 Type 2 diabetes mellitus with diabetic neuropathy, unspecified: Secondary | ICD-10-CM

## 2022-03-24 NOTE — Telephone Encounter (Unsigned)
Copied from [place] [id number]. Topic: General - Other >> Mar 24, 2022 12:35 PM Ludger Nutting wrote: Patient called to see if the office has any samples of dapagliflozin propanediol (FARXIGA) 10 MG TABS tablet that he can pick up. Please follow up with patient.

## 2022-03-26 MED ORDER — DAPAGLIFLOZIN PROPANEDIOL 10 MG PO TABS: 10.0000 mg | ORAL_TABLET | Freq: Every day | ORAL | 0 refills | Status: DC

## 2022-03-26 NOTE — Telephone Encounter (Signed)
Yes, he can have a month's worth of samples if we have them

## 2022-03-26 NOTE — Telephone Encounter (Signed)
Samples placed up front. Patient advised.  

## 2022-03-27 NOTE — Progress Notes (Deleted)
      Established patient visit   Patient: Taylor [name]   DOB: 22-Mar-1956   66 y.o. Male  MRN: [id number] Visit Date: 03/30/2022  Today's healthcare provider: Lelon Huh, MD   No chief complaint on file.  Subjective    HPI  Follow up for type 2 diabetes  The patient was last seen for this 1 months ago. Changes made at last visit include continue to take daily medications. Recheck A1c in one month.  He reports {excellent/good/fair/poor:19665} compliance with treatment. He feels that condition is {improved/worse/unchanged:3041574}. He {is/is not:21021397} having side effects. ***  Lab Results  Component Value Date   HGBA1C 9.9 (H) 01/14/2022    -----------------------------------------------------------------------------------------   Medications: Outpatient Medications Prior to Visit  Medication Sig   apixaban (ELIQUIS) 5 MG TABS tablet TAKE 1 TABLET BY MOUTH TWICE DAILY   dapagliflozin propanediol (FARXIGA) 10 MG TABS tablet Take 1 tablet (10 mg total) by mouth daily before breakfast.   glipiZIDE (GLUCOTROL XL) 10 MG 24 hr tablet TAKE 1 TABLET BY MOUTH ONCE DAILY.   HYDROmorphone (DILAUDID) 4 MG tablet Take 4 mg by mouth See admin instructions. Brand name Dilaudid 4 mg - 4 mg 3-4 times daily (RX written for 4 times daily)   losartan (COZAAR) 25 MG tablet Take 1 tablet (25 mg total) by mouth daily.   metFORMIN (GLUCOPHAGE) 1000 MG tablet TAKE 1 TABLET BY MOUTH TWICE DAILY   metoprolol (TOPROL XL) 200 MG 24 hr tablet Take 1 tablet (200 mg total) by mouth daily.   NARCAN 4 MG/0.1ML LIQD nasal spray kit Place 0.4 mg into the nose as needed (accidental overdose).   OXYCONTIN 20 MG 12 hr tablet Take 1 tablet (20 mg total) by mouth every 12 (twelve) hours. (Patient taking differently: Take 20 mg by mouth 4 (four) times daily.)   potassium chloride SA (KLOR-CON M) 20 MEQ tablet Take 1 tablet (20 mEq total) by mouth daily. (Patient not taking: Reported on 02/27/2022)    pregabalin (LYRICA) 100 MG capsule Take 100 mg by mouth 3 (three) times daily. (Patient not taking: Reported on 02/27/2022)   simvastatin (ZOCOR) 40 MG tablet TAKE 1 TABLET BY MOUTH ONCE DAILY.   tamsulosin (FLOMAX) 0.4 MG CAPS capsule Take 1 capsule (0.4 mg total) by mouth daily.   torsemide (DEMADEX) 20 MG tablet Take 1 tablet (20 mg total) by mouth 2 (two) times daily.   zolpidem (AMBIEN CR) 12.5 MG CR tablet Take 12.5 mg by mouth at bedtime.    No facility-administered medications prior to visit.    Review of Systems  {Labs  Heme  Chem  Endocrine  Serology  Results Review (optional):23779}   Objective    There were no vitals taken for this visit. {Show previous vital signs (optional):23777}  Physical Exam  ***  No results found for any visits on 03/30/22.  Assessment & Plan     ***  No follow-ups on file.      {provider attestation***:1}   Lelon Huh, MD  [facility] [phone] (phone) [phone] (fax)  [place]

## 2022-03-30 ENCOUNTER — Ambulatory Visit: Payer: Medicare Other | Admitting: Family Medicine

## 2022-03-30 DIAGNOSIS — E1140 Type 2 diabetes mellitus with diabetic neuropathy, unspecified: Secondary | ICD-10-CM

## 2022-04-13 ENCOUNTER — Other Ambulatory Visit: Payer: Self-pay | Admitting: Family Medicine

## 2022-04-27 ENCOUNTER — Telehealth: Payer: Self-pay

## 2022-04-27 DIAGNOSIS — E1140 Type 2 diabetes mellitus with diabetic neuropathy, unspecified: Secondary | ICD-10-CM

## 2022-04-27 NOTE — Telephone Encounter (Signed)
Copied from CRM [id number]. Topic: General - Inquiry >> Apr 27, 2022 12:02 PM Pincus Sanes wrote: Reason for CRM: pt staes usually gets FARXIGA) 10 MG TABS tablet samples any available?[phone]

## 2022-04-27 NOTE — Telephone Encounter (Signed)
Patient advised.

## 2022-04-27 NOTE — Telephone Encounter (Signed)
He can have 5 weeks samples of Farxiga 10mg .

## 2022-04-28 MED ORDER — DAPAGLIFLOZIN PROPANEDIOL 10 MG PO TABS: 10.0000 mg | ORAL_TABLET | Freq: Every day | ORAL | 0 refills | Status: DC

## 2022-04-28 NOTE — Addendum Note (Signed)
Addended by: Benjiman Core on: 04/28/2022 01:57 PM   Modules accepted: Orders

## 2022-05-01 ENCOUNTER — Other Ambulatory Visit: Payer: Self-pay | Admitting: Family Medicine

## 2022-05-11 ENCOUNTER — Other Ambulatory Visit: Payer: Self-pay | Admitting: Family Medicine

## 2022-05-12 NOTE — Telephone Encounter (Signed)
Requested Prescriptions  Pending Prescriptions Disp Refills   simvastatin (ZOCOR) 40 MG tablet [Pharmacy Med Name: simvastatin 40 mg tablet] 90 tablet 0    Sig: TAKE 1 TABLET BY MOUTH ONCE DAILY.     Cardiovascular:  Antilipid - Statins Failed - 05/11/2022  4:10 PM      Failed - Lipid Panel in normal range within the last 12 months    Cholesterol, Total  Date Value Ref Range Status  10/16/2020 177 100 - 199 mg/dL Final   LDL Chol Calc (NIH)  Date Value Ref Range Status  10/16/2020 102 (H) 0 - 99 mg/dL Final   HDL  Date Value Ref Range Status  10/16/2020 46 >39 mg/dL Final   Triglycerides  Date Value Ref Range Status  01/16/2022 117 <150 mg/dL Final    Comment:    Performed at [facility], [address], [place], [place] [address]         Passed - Patient is not pregnant      Passed - Valid encounter within last 12 months    Recent Outpatient Visits           2 months ago Acute renal failure, unspecified acute renal failure type [facility])   [facility] Malva Limes, MD   7 months ago Primary hypertension   [facility] Malva Limes, MD   1 year ago Type 2 diabetes mellitus with diabetic neuropathy, with long-term current use of insulin [facility])   [facility] Malva Limes, MD   1 year ago Annual physical exam   [facility] Malva Limes, MD   1 year ago Type 2 diabetes mellitus with diabetic neuropathy, with long-term current use of insulin [facility])   [facility] Malva Limes, MD       Future Appointments             In 1 month Agbor-Etang, Arlys John, MD [facility] [place]

## 2022-06-17 ENCOUNTER — Other Ambulatory Visit: Payer: Self-pay | Admitting: Family Medicine

## 2022-06-17 ENCOUNTER — Ambulatory Visit (INDEPENDENT_AMBULATORY_CARE_PROVIDER_SITE_OTHER): Payer: Medicare HMO

## 2022-06-17 VITALS — Ht 72.0 in | Wt 362.0 lb

## 2022-06-17 DIAGNOSIS — Z0000 Encounter for general adult medical examination without abnormal findings: Secondary | ICD-10-CM

## 2022-06-17 DIAGNOSIS — E1140 Type 2 diabetes mellitus with diabetic neuropathy, unspecified: Secondary | ICD-10-CM

## 2022-06-17 MED ORDER — DAPAGLIFLOZIN PROPANEDIOL 10 MG PO TABS: 10.0000 mg | ORAL_TABLET | Freq: Every day | ORAL | 3 refills | Status: DC

## 2022-06-17 NOTE — Progress Notes (Signed)
refill Received: Today Dionisio David, LPN  Birdie Sons, MD Hi Dr.Yanixan Mellinger, I did his AWV and he requested a refill on his Farxiga 10mg . Thanks!

## 2022-06-17 NOTE — Patient Instructions (Signed)
Taylor [name] , Thank you for taking time to come for your Medicare Wellness Visit. I appreciate your ongoing commitment to your health goals. Please review the following plan we discussed and let me know if I can assist you in the future.   Screening recommendations/referrals: Colonoscopy: declined referral Recommended yearly ophthalmology/optometry visit for glaucoma screening and checkup Recommended yearly dental visit for hygiene and checkup  Vaccinations: Influenza vaccine: 02/17/22 Pneumococcal vaccine: 11/27/13 Tdap vaccine: 11/25/12 Shingles vaccine: n/d   Covid-19: n/d  Advanced directives: no  Conditions/risks identified: none  Next appointment: Follow up in one year for your annual wellness visit. 06/22/23 @ 10:15 am by phone  Preventive Care 66 Years and Older, Male Preventive care refers to lifestyle choices and visits with your health care provider that can promote health and wellness. What does preventive care include? A yearly physical exam. This is also called an annual well check. Dental exams once or twice a year. Routine eye exams. Ask your health care provider how often you should have your eyes checked. Personal lifestyle choices, including: Daily care of your teeth and gums. Regular physical activity. Eating a healthy diet. Avoiding tobacco and drug use. Limiting alcohol use. Practicing safe sex. Taking low doses of aspirin every day. Taking vitamin and mineral supplements as recommended by your health care provider. What happens during an annual well check? The services and screenings done by your health care provider during your annual well check will depend on your age, overall health, lifestyle risk factors, and family history of disease. Counseling  Your health care provider may ask you questions about your: Alcohol use. Tobacco use. Drug use. Emotional well-being. Home and relationship well-being. Sexual activity. Eating habits. History of  falls. Memory and ability to understand (cognition). Work and work Statistician. Screening  You may have the following tests or measurements: Height, weight, and BMI. Blood pressure. Lipid and cholesterol levels. These may be checked every 5 years, or more frequently if you are over 72 years old. Skin check. Lung cancer screening. You may have this screening every year starting at age 57 if you have a 30-pack-year history of smoking and currently smoke or have quit within the past 15 years. Fecal occult blood test (FOBT) of the stool. You may have this test every year starting at age 33. Flexible sigmoidoscopy or colonoscopy. You may have a sigmoidoscopy every 5 years or a colonoscopy every 10 years starting at age 38. Prostate cancer screening. Recommendations will vary depending on your family history and other risks. Hepatitis C blood test. Hepatitis B blood test. Sexually transmitted disease (STD) testing. Diabetes screening. This is done by checking your blood sugar (glucose) after you have not eaten for a while (fasting). You may have this done every 1-3 years. Abdominal aortic aneurysm (AAA) screening. You may need this if you are a current or former smoker. Osteoporosis. You may be screened starting at age 5 if you are at high risk. Talk with your health care provider about your test results, treatment options, and if necessary, the need for more tests. Vaccines  Your health care provider may recommend certain vaccines, such as: Influenza vaccine. This is recommended every year. Tetanus, diphtheria, and acellular pertussis (Tdap, Td) vaccine. You may need a Td booster every 10 years. Zoster vaccine. You may need this after age 45. Pneumococcal 13-valent conjugate (PCV13) vaccine. One dose is recommended after age 54. Pneumococcal polysaccharide (PPSV23) vaccine. One dose is recommended after age 42. Talk to your health care provider  about which screenings and vaccines you need and  how often you need them. This information is not intended to replace advice given to you by your health care provider. Make sure you discuss any questions you have with your health care provider. Document Released: 06/21/2015 Document Revised: 02/12/2016 Document Reviewed: 03/26/2015 Elsevier Interactive Patient Education  2017 [place] Prevention in the Home Falls can cause injuries. They can happen to people of all ages. There are many things you can do to make your home safe and to help prevent falls. What can I do on the outside of my home? Regularly fix the edges of walkways and driveways and fix any cracks. Remove anything that might make you trip as you walk through a door, such as a raised step or threshold. Trim any bushes or trees on the path to your home. Use bright outdoor lighting. Clear any walking paths of anything that might make someone trip, such as rocks or tools. Regularly check to see if handrails are loose or broken. Make sure that both sides of any steps have handrails. Any raised decks and porches should have guardrails on the edges. Have any leaves, snow, or ice cleared regularly. Use sand or salt on walking paths during winter. Clean up any spills in your garage right away. This includes oil or grease spills. What can I do in the bathroom? Use night lights. Install grab bars by the toilet and in the tub and shower. Do not use towel bars as grab bars. Use non-skid mats or decals in the tub or shower. If you need to sit down in the shower, use a plastic, non-slip stool. Keep the floor dry. Clean up any water that spills on the floor as soon as it happens. Remove soap buildup in the tub or shower regularly. Attach bath mats securely with double-sided non-slip rug tape. Do not have throw rugs and other things on the floor that can make you trip. What can I do in the bedroom? Use night lights. Make sure that you have a light by your bed that is easy to  reach. Do not use any sheets or blankets that are too big for your bed. They should not hang down onto the floor. Have a firm chair that has side arms. You can use this for support while you get dressed. Do not have throw rugs and other things on the floor that can make you trip. What can I do in the kitchen? Clean up any spills right away. Avoid walking on wet floors. Keep items that you use a lot in easy-to-reach places. If you need to reach something above you, use a strong step stool that has a grab bar. Keep electrical cords out of the way. Do not use floor polish or wax that makes floors slippery. If you must use wax, use non-skid floor wax. Do not have throw rugs and other things on the floor that can make you trip. What can I do with my stairs? Do not leave any items on the stairs. Make sure that there are handrails on both sides of the stairs and use them. Fix handrails that are broken or loose. Make sure that handrails are as long as the stairways. Check any carpeting to make sure that it is firmly attached to the stairs. Fix any carpet that is loose or worn. Avoid having throw rugs at the top or bottom of the stairs. If you do have throw rugs, attach them to the floor  with carpet tape. Make sure that you have a light switch at the top of the stairs and the bottom of the stairs. If you do not have them, ask someone to add them for you. What else can I do to help prevent falls? Wear shoes that: Do not have high heels. Have rubber bottoms. Are comfortable and fit you well. Are closed at the toe. Do not wear sandals. If you use a stepladder: Make sure that it is fully opened. Do not climb a closed stepladder. Make sure that both sides of the stepladder are locked into place. Ask someone to hold it for you, if possible. Clearly mark and make sure that you can see: Any grab bars or handrails. First and last steps. Where the edge of each step is. Use tools that help you move  around (mobility aids) if they are needed. These include: Canes. Walkers. Scooters. Crutches. Turn on the lights when you go into a dark area. Replace any light bulbs as soon as they burn out. Set up your furniture so you have a clear path. Avoid moving your furniture around. If any of your floors are uneven, fix them. If there are any pets around you, be aware of where they are. Review your medicines with your doctor. Some medicines can make you feel dizzy. This can increase your chance of falling. Ask your doctor what other things that you can do to help prevent falls. This information is not intended to replace advice given to you by your health care provider. Make sure you discuss any questions you have with your health care provider. Document Released: 03/21/2009 Document Revised: 10/31/2015 Document Reviewed: 06/29/2014 Elsevier Interactive Patient Education  2017 [employer].

## 2022-06-17 NOTE — Progress Notes (Signed)
Virtual Visit via Telephone Note  I connected with  Taylor [name] on 06/17/22 at 10:45 AM EST by telephone and verified that I am speaking with the correct person using two identifiers.  Location: Patient: home Provider: [facility] Persons participating in the virtual visit: [name]   I discussed the limitations, risks, security and privacy concerns of performing an evaluation and management service by telephone and the availability of in person appointments. The patient expressed understanding and agreed to proceed.  Interactive audio and video telecommunications were attempted between this nurse and patient, however failed, due to patient having technical difficulties OR patient did not have access to video capability.  We continued and completed visit with audio only.  Some vital signs may be absent or patient reported.   Taylor David, LPN  Subjective:   Taylor [name] is a 67 y.o. male who presents for Medicare Annual/Subsequent preventive examination.  Review of Systems     Cardiac Risk Factors include: advanced age (>28men, >44 women);diabetes mellitus;dyslipidemia;hypertension;male gender     Objective:    Today's Vitals   06/17/22 1049  PainSc: 6    There is no height or weight on file to calculate BMI.     06/17/2022   10:57 AM 01/15/2022    2:30 PM 01/13/2022   10:44 PM 03/03/2021    9:50 AM 08/14/2020    7:53 AM 07/01/2020   10:45 AM 04/05/2020    7:15 AM  Advanced Directives  Does Patient Have a Medical Advance Directive? No  No No No No No  Would patient like information on creating a medical advance directive? No - Patient declined No - Patient declined  Yes (MAU/Ambulatory/Procedural Areas - Information given)  No - Patient declined     Current Medications (verified) Outpatient Encounter Medications as of 06/17/2022  Medication Sig   apixaban (ELIQUIS) 5 MG TABS tablet TAKE 1 TABLET BY MOUTH TWICE DAILY   dapagliflozin propanediol  (FARXIGA) 10 MG TABS tablet Take 1 tablet (10 mg total) by mouth daily before breakfast.   glipiZIDE (GLUCOTROL XL) 10 MG 24 hr tablet TAKE ONE TABLET BY MOUTH EVERY DAY   HYDROmorphone (DILAUDID) 4 MG tablet Take 4 mg by mouth See admin instructions. Brand name Dilaudid 4 mg - 4 mg 3-4 times daily (RX written for 4 times daily)   metFORMIN (GLUCOPHAGE) 1000 MG tablet TAKE 1 TABLET BY MOUTH TWICE DAILY   metoprolol (TOPROL XL) 200 MG 24 hr tablet Take 1 tablet (200 mg total) by mouth daily.   NARCAN 4 MG/0.1ML LIQD nasal spray kit Place 0.4 mg into the nose as needed (accidental overdose).   OXYCONTIN 20 MG 12 hr tablet Take 1 tablet (20 mg total) by mouth every 12 (twelve) hours. (Patient taking differently: Take 20 mg by mouth 4 (four) times daily.)   potassium chloride SA (KLOR-CON M) 20 MEQ tablet Take 1 tablet (20 mEq total) by mouth daily.   pregabalin (LYRICA) 100 MG capsule Take 100 mg by mouth 3 (three) times daily.   simvastatin (ZOCOR) 40 MG tablet TAKE 1 TABLET BY MOUTH ONCE DAILY.   tamsulosin (FLOMAX) 0.4 MG CAPS capsule TAKE ONE CAPSULE BY MOUTH EVERY DAY   torsemide (DEMADEX) 20 MG tablet Take 1 tablet (20 mg total) by mouth 2 (two) times daily.   zolpidem (AMBIEN CR) 12.5 MG CR tablet Take 12.5 mg by mouth at bedtime.    losartan (COZAAR) 25 MG tablet Take 1 tablet (25 mg total) by mouth  daily.   No facility-administered encounter medications on file as of 06/17/2022.    Allergies (verified) Other   History: Past Medical History:  Diagnosis Date   Arthritis    Broken leg 02/07/2019   CAD (coronary artery disease)    Depression    Diabetes mellitus without complication (HCC)    Hypertension    NICM (nonischemic cardiomyopathy) (HCC)    Past Surgical History:  Procedure Laterality Date   BACK SURGERY     CARDIOVERSION N/A 08/14/2020   Procedure: CARDIOVERSION;  Surgeon: Debbe Odea, MD;  Location: [facility] ORS;  Service: Cardiovascular;  Laterality: N/A;    CARDIOVERSION N/A 03/03/2021   Procedure: CARDIOVERSION (CATH LAB);  Surgeon: Lanier Prude, MD;  Location: [facility] INVASIVE CV LAB;  Service: Cardiovascular;  Laterality: N/A;   LEFT HEART CATH AND CORONARY ANGIOGRAPHY Left 04/05/2020   Procedure: LEFT HEART CATH AND CORONARY ANGIOGRAPHY;  Surgeon: Iran Ouch, MD;  Location: [facility] INVASIVE CV LAB;  Service: Cardiovascular;  Laterality: Left;   TEE WITHOUT CARDIOVERSION N/A 03/03/2021   Procedure: TRANSESOPHAGEAL ECHOCARDIOGRAM (TEE);  Surgeon: Lanier Prude, MD;  Location: [facility] INVASIVE CV LAB;  Service: Cardiovascular;  Laterality: N/A;   Family History  Problem Relation Age of Onset   Diabetes Mother    Diabetes Sister    Social History   Socioeconomic History   Marital status: Single    Spouse name: Not on file   Number of children: 1   Years of education: Not on file   Highest education level: Some college, no degree  Occupational History   Occupation: disability/retired  Tobacco Use   Smoking status: Never   Smokeless tobacco: Never  Vaping Use   Vaping Use: Never used  Substance and Sexual Activity   Alcohol use: No    Alcohol/week: 0.0 standard drinks of alcohol   Drug use: No   Sexual activity: Not on file  Other Topics Concern   Not on file  Social History Narrative   Lives with Mother Taylor [name].    Social Determinants of Health   Financial Resource Strain: Low Risk  (06/17/2022)   Overall Financial Resource Strain (CARDIA)    Difficulty of Paying Living Expenses: Not very hard  Food Insecurity: No Food Insecurity (06/17/2022)   Hunger Vital Sign    Worried About Running Out of Food in the Last Year: Never true    Ran Out of Food in the Last Year: Never true  Transportation Needs: No Transportation Needs (06/17/2022)   PRAPARE - Administrator, Civil Service (Medical): No    Lack of Transportation (Non-Medical): No  Physical Activity: Inactive (06/17/2022)   Exercise Vital Sign    Days of  Exercise per Week: 0 days    Minutes of Exercise per Session: 0 min  Stress: No Stress Concern Present (06/17/2022)   [employer] of Occupational Health - Occupational Stress Questionnaire    Feeling of Stress : Only a little  Social Connections: Socially Isolated (06/17/2022)   Social Connection and Isolation Panel [NHANES]    Frequency of Communication with Friends and Family: More than three times a week    Frequency of Social Gatherings with Friends and Family: Once a week    Attends Religious Services: Never    Database administrator or Organizations: No    Attends Banker Meetings: Never    Marital Status: Divorced    Tobacco Counseling Counseling given: Not Answered   Clinical Intake:  Pre-visit preparation completed:  Yes  Pain : 0-10 Pain Score: 6  Pain Type: Chronic pain Pain Location: Back     Nutritional Risks: None Diabetes: Yes CBG done?: No Did pt. bring in CBG monitor from home?: No  How often do you need to have someone help you when you read instructions, pamphlets, or other written materials from your doctor or pharmacy?: 1 - Never  Diabetic?yes Nutrition Risk Assessment:  Has the patient had any N/V/D within the last 2 months?  No  Does the patient have any non-healing wounds?  No  Has the patient had any unintentional weight loss or weight gain?  No   Diabetes:  Is the patient diabetic?  Yes  If diabetic, was a CBG obtained today?  No  Did the patient bring in their glucometer from home?  No  How often do you monitor your CBG's? Once per day.   Financial Strains and Diabetes Management:  Are you having any financial strains with the device, your supplies or your medication? No .  Does the patient want to be seen by Chronic Care Management for management of their diabetes?  No  Would the patient like to be referred to a Nutritionist or for Diabetic Management?  No   Diabetic Exams:  Diabetic Eye Exam: Completed 11/07/20.  Overdue for diabetic eye exam. Pt has been advised about the importance in completing this exam.  Diabetic Foot Exam: Completed 11/15/14. Pt has been advised about the importance in completing this exam.     Interpreter Needed?: No  Information entered by :: Kirke Shaggy, LPN   Activities of Daily Living    06/17/2022   10:58 AM 01/15/2022    2:30 PM  In your present state of health, do you have any difficulty performing the following activities:  Hearing? 0 0  Vision? 0 0  Difficulty concentrating or making decisions? 0 0  Walking or climbing stairs? 1 0  Dressing or bathing? 0 0  Doing errands, shopping? 0 0  Preparing Food and eating ? N   Using the Toilet? N   In the past six months, have you accidently leaked urine? N   Do you have problems with loss of bowel control? N   Managing your Medications? N   Managing your Finances? N   Housekeeping or managing your Housekeeping? N     Patient Care Team: Birdie Sons, MD as PCP - General (Family Medicine) Kate Sable, MD as PCP - Cardiology (Cardiology) Vickie Epley, MD as PCP - Electrophysiology (Cardiology) Thalia Bloodgood, MD as Referring Physician (Anesthesiology) Horald Pollen, Student-RN as Student Nurse (Student) Birder Robson, MD as Referring Physician (Ophthalmology) Chesley Mires, MD as Consulting Physician (Pulmonary Disease)  Indicate any recent Medical Services you may have received from other than [facility] providers in the past year (date may be approximate).     Assessment:   This is a routine wellness examination for Taylor [name].  Hearing/Vision screen Hearing Screening - Comments:: No aids Vision Screening - Comments:: Readers- [facility]  Dietary issues and exercise activities discussed: Current Exercise Habits: The patient does not participate in regular exercise at present, Exercise limited by: orthopedic condition(s)   Goals Addressed             This Visit's Progress     DIET - EAT MORE FRUITS AND VEGETABLES         Depression Screen    06/17/2022   10:54 AM 10/16/2020   10:11 AM 07/01/2020   10:48 AM  01/30/2020    1:34 PM 09/25/2019    2:05 PM 05/22/2019    3:53 PM 04/07/2018   10:17 AM  PHQ 2/9 Scores  PHQ - 2 Score 1 3 1 3 3 4 2   PHQ- 9 Score 2 10  9 11 12 13     Fall Risk    06/17/2022   10:58 AM 10/16/2020   10:13 AM 07/01/2020   10:48 AM 05/22/2019    3:49 PM 04/07/2018   10:17 AM  Fall Risk   Falls in the past year? 0 0 0 1 No  Number falls in past yr: 0 0  0   Injury with Fall? 0 0  0   Risk for fall due to : No Fall Risks      Follow up Falls prevention discussed;Falls evaluation completed Falls evaluation completed  Falls prevention discussed     FALL RISK PREVENTION PERTAINING TO THE HOME:  Any stairs in or around the home? Yes  If so, are there any without handrails? No  Home free of loose throw rugs in walkways, pet beds, electrical cords, etc? Yes  Adequate lighting in your home to reduce risk of falls? Yes   ASSISTIVE DEVICES UTILIZED TO PREVENT FALLS:  Life alert? No  Use of a cane, walker or w/c? No  Grab bars in the bathroom? Yes  Shower chair or bench in shower? No  Elevated toilet seat or a handicapped toilet? No    Cognitive Function:        06/17/2022   10:58 AM  6CIT Screen  What Year? 0 points  What month? 0 points  What time? 0 points  Count back from 20 0 points  Months in reverse 0 points  Repeat phrase 0 points  Total Score 0 points    Immunizations Immunization History  Administered Date(s) Administered   Influenza,inj,Quad PF,6+ Mos 03/13/2019, 05/13/2020, 02/17/2022   Pneumococcal Polysaccharide-23 11/27/2013   Tdap 11/25/2012    TDAP status: Up to date  Flu Vaccine status: Up to date  Pneumococcal vaccine status: Up to date  Covid-19 vaccine status: Declined, Education has been provided regarding the importance of this vaccine but patient still declined. Advised may receive this  vaccine at local pharmacy or Health Dept.or vaccine clinic. Aware to provide a copy of the vaccination record if obtained from local pharmacy or Health Dept. Verbalized acceptance and understanding.  Qualifies for Shingles Vaccine? Yes   Zostavax completed No   Shingrix Completed?: No.    Education has been provided regarding the importance of this vaccine. Patient has been advised to call insurance company to determine out of pocket expense if they have not yet received this vaccine. Advised may also receive vaccine at local pharmacy or Health Dept. Verbalized acceptance and understanding.  Screening Tests Health Maintenance  Topic Date Due   COVID-19 Vaccine (1) Never done   Zoster Vaccines- Shingrix (1 of 2) Never done   COLONOSCOPY (Pts 45-52yrs Insurance coverage will need to be confirmed)  Never done   Diabetic kidney evaluation - Urine ACR  03/31/2018   COLON CANCER SCREENING ANNUAL FOBT  03/14/2021   OPHTHALMOLOGY EXAM  11/07/2021   Pneumonia Vaccine 59+ Years old (2 - PCV) 03/27/2022   HEMOGLOBIN A1C  07/17/2022   DTaP/Tdap/Td (2 - Td or Tdap) 11/26/2022   Diabetic kidney evaluation - eGFR measurement  02/18/2023   Medicare Annual Wellness (AWV)  06/18/2023   INFLUENZA VACCINE  Completed   Hepatitis C Screening  Completed  HIV Screening  Completed   HPV VACCINES  Aged Out    Health Maintenance  Health Maintenance Due  Topic Date Due   COVID-19 Vaccine (1) Never done   Zoster Vaccines- Shingrix (1 of 2) Never done   COLONOSCOPY (Pts 45-62yrs Insurance coverage will need to be confirmed)  Never done   Diabetic kidney evaluation - Urine ACR  03/31/2018   COLON CANCER SCREENING ANNUAL FOBT  03/14/2021   OPHTHALMOLOGY EXAM  11/07/2021   Pneumonia Vaccine 93+ Years old (2 - PCV) 03/27/2022    Declined referral for colonoscopy  Lung Cancer Screening: (Low Dose CT Chest recommended if Age 64-80 years, 30 pack-year currently smoking OR have quit w/in 15years.) does not  qualify.   Additional Screening:  Hepatitis C Screening: does qualify; Completed 07/30/16  Vision Screening: Recommended annual ophthalmology exams for early detection of glaucoma and other disorders of the eye. Is the patient up to date with their annual eye exam?  Yes  Who is the provider or what is the name of the office in which the patient attends annual eye exams? [facility] If pt is not established with a provider, would they like to be referred to a provider to establish care? No .   Dental Screening: Recommended annual dental exams for proper oral hygiene  Community Resource Referral / Chronic Care Management: CRR required this visit?  No   CCM required this visit?  No      Plan:     I have personally reviewed and noted the following in the patient's chart:   Medical and social history Use of alcohol, tobacco or illicit drugs  Current medications and supplements including opioid prescriptions. Patient is currently taking opioid prescriptions. Information provided to patient regarding non-opioid alternatives. Patient advised to discuss non-opioid treatment plan with their provider. Functional ability and status Nutritional status Physical activity Advanced directives List of other physicians Hospitalizations, surgeries, and ER visits in previous 12 months Vitals Screenings to include cognitive, depression, and falls Referrals and appointments  In addition, I have reviewed and discussed with patient certain preventive protocols, quality metrics, and best practice recommendations. A written personalized care plan for preventive services as well as general preventive health recommendations were provided to patient.     Taylor David, LPN   [id number]   Nurse Notes: none

## 2022-06-24 ENCOUNTER — Other Ambulatory Visit: Payer: Self-pay | Admitting: Pharmacist

## 2022-06-24 DIAGNOSIS — I4821 Permanent atrial fibrillation: Secondary | ICD-10-CM

## 2022-06-24 MED ORDER — APIXABAN 5 MG PO TABS: 5.0000 mg | ORAL_TABLET | Freq: Two times a day (BID) | ORAL | 1 refills | Status: DC

## 2022-06-29 ENCOUNTER — Ambulatory Visit: Payer: Medicare HMO | Admitting: Cardiology

## 2022-07-07 ENCOUNTER — Other Ambulatory Visit: Payer: Self-pay | Admitting: Family Medicine

## 2022-07-07 NOTE — Telephone Encounter (Signed)
Requested medication (s) are due for refill today: yes  Requested medication (s) are on the active medication list: yes  Last refill:  04/13/22 #30 1 RF  Future visit scheduled: no  Notes to clinic:  overdue PSA (10/16/20)   Requested Prescriptions  Pending Prescriptions Disp Refills   tamsulosin (FLOMAX) 0.4 MG CAPS capsule [Pharmacy Med Name: tamsulosin 0.4 mg capsule] 30 capsule 1    Sig: TAKE ONE CAPSULE BY [facility]     Urology: Alpha-Adrenergic Blocker Failed - 07/07/2022  8:06 AM      Failed - PSA in normal range and within 360 days    Prostate Specific Ag, Serum  Date Value Ref Range Status  10/16/2020 0.3 0.0 - 4.0 ng/mL Final    Comment:    Roche ECLIA methodology. According to the American Urological Association, Serum PSA should decrease and remain at undetectable levels after radical prostatectomy. The AUA defines biochemical recurrence as an initial PSA value 0.2 ng/mL or greater followed by a subsequent confirmatory PSA value 0.2 ng/mL or greater. Values obtained with different assay methods or kits cannot be used interchangeably. Results cannot be interpreted as absolute evidence of the presence or absence of malignant disease.          Passed - Last BP in normal range    BP Readings from Last 1 Encounters:  02/27/22 120/70         Passed - Valid encounter within last 12 months    Recent Outpatient Visits           4 months ago Acute renal failure, unspecified acute renal failure type [facility])   [place] Birdie Sons, MD   9 months ago Primary hypertension   [place], Donald E, MD   1 year ago Type 2 diabetes mellitus with diabetic neuropathy, with long-term current use of insulin ([place])   [place], Donald E, MD   1 year ago Annual physical exam   [place], Donald E, MD   2 years ago Type 2 diabetes mellitus  with diabetic neuropathy, with long-term current use of insulin ([place])   [place], Donald E, MD       Future Appointments             In 1 week Ag[place], Aaron Edelman, MD [place] at [facility]

## 2022-07-20 ENCOUNTER — Ambulatory Visit: Payer: Medicare Other | Attending: Cardiology | Admitting: Cardiology

## 2022-07-20 ENCOUNTER — Encounter: Payer: Self-pay | Admitting: Cardiology

## 2022-07-20 VITALS — BP 130/80 | HR 117 | Ht 72.0 in | Wt 336.4 lb

## 2022-07-20 DIAGNOSIS — I4821 Permanent atrial fibrillation: Secondary | ICD-10-CM

## 2022-07-20 DIAGNOSIS — I428 Other cardiomyopathies: Secondary | ICD-10-CM | POA: Diagnosis not present

## 2022-07-20 DIAGNOSIS — I2510 Atherosclerotic heart disease of native coronary artery without angina pectoris: Secondary | ICD-10-CM

## 2022-07-20 MED ORDER — LOSARTAN POTASSIUM 25 MG PO TABS: 25.0000 mg | ORAL_TABLET | Freq: Every day | ORAL | 3 refills | Status: AC

## 2022-07-20 MED ORDER — AMIODARONE HCL 200 MG PO TABS: 200.0000 mg | ORAL_TABLET | Freq: Two times a day (BID) | ORAL | 0 refills | Status: DC

## 2022-07-20 MED ORDER — METOPROLOL SUCCINATE ER 200 MG PO TB24: 200.0000 mg | ORAL_TABLET | Freq: Every day | ORAL | 2 refills | Status: DC

## 2022-07-20 NOTE — Patient Instructions (Signed)
Medication Instructions:   1. START AMIODARONE - Take one tablet (200 mg) by mouth twice a day.   *If you need a refill on your cardiac medications before your next appointment, please call your pharmacy*   Lab Work:  None Ordered  If you have labs (blood work) drawn today and your tests are completely normal, you will receive your results only by: [place] (if you have MyChart) OR A paper copy in the mail If you have any lab test that is abnormal or we need to change your treatment, we will call you to review the results.   Testing/Procedures:  None Ordered   Follow-Up: At [facility], you and your health needs are our priority.  As part of our continuing mission to provide you with exceptional heart care, we have created designated Provider Care Teams.  These Care Teams include your primary Cardiologist (physician) and Advanced Practice Providers (APPs -  Physician Assistants and Nurse Practitioners) who all work together to provide you with the care you need, when you need it.  We recommend signing up for the patient portal called "MyChart".  Sign up information is provided on this After Visit Summary.  MyChart is used to connect with patients for Virtual Visits (Telemedicine).  Patients are able to view lab/test results, encounter notes, upcoming appointments, etc.  Non-urgent messages can be sent to your provider as well.   To learn more about what you can do with MyChart, go to [email or web address].    Your next appointment:   6 week(s)  Provider:   Kate Sable, MD ONLY

## 2022-07-20 NOTE — Progress Notes (Signed)
Cardiology Office Note:    Date:  07/20/2022   ID:  Taylor [name], DOB [birth year], MRN [id number]  PCP:  Birdie Sons, MD  Cardiologist:  Kate Sable, MD  Electrophysiologist:  Vickie Epley, MD   Referring MD: Birdie Sons, MD   Chief Complaint  Patient presents with   Follow-up    4 month f/u, no new cardiac concerns       History of Present Illness:    Taylor [name] is a 67 y.o. male with a hx of hypertension, CAD (lhc 10/21-60% mLAD, 30% D2), diabetes, permanent atrial fibrillation s/p failed DCCV, NICM last EF 35-40%, OSA, morbid obesity who presents for follow-up.    6 months ago admitted with rapid A-fib, altered mental status, edema.  Managed with IV diuresing, IV amiodarone switched to p.o. amiodarone.  Amiodarone was titrated off since patient has permanent A-fib.  Metoprolol was increased to 200 mg daily, losartan 25 mg twice daily added after last clinic visit.  Heart rate was well-controlled while on amiodarone.  Denies chest pain or shortness of breath, no new concerns at this time.   Prior notes Echo 03/2022 EF 40 to 45% Echocardiogram 12/2020 EF 35 to 40% Echocardiogram 12/2019 showed moderate to severely reduced EF, EF 30 to 35%. Left heart cath 03/2020 mid LAD 60% stenosis, second diagonal 30% stenosis. Failed DC cardioversion (for afib) attempt on 08/06/2020, not candidate for RFA due to morbid obesity.   Past Medical History:  Diagnosis Date   Arthritis    Broken leg 02/07/2019   CAD (coronary artery disease)    Depression    Diabetes mellitus without complication ([place])    Hypertension    NICM (nonischemic cardiomyopathy) ([place])     Past Surgical History:  Procedure Laterality Date   BACK SURGERY     CARDIOVERSION N/A 08/14/2020   Procedure: CARDIOVERSION;  Surgeon: Kate Sable, MD;  Location: [facility] ORS;  Service: Cardiovascular;  Laterality: N/A;   CARDIOVERSION N/A 03/03/2021   Procedure: CARDIOVERSION (CATH LAB);   Surgeon: Vickie Epley, MD;  Location: [place] [facility];  Service: Cardiovascular;  Laterality: N/A;   LEFT HEART CATH AND CORONARY ANGIOGRAPHY Left 04/05/2020   Procedure: LEFT HEART CATH AND CORONARY ANGIOGRAPHY;  Surgeon: Wellington Hampshire, MD;  Location: [facility] [facility];  Service: Cardiovascular;  Laterality: Left;   TEE WITHOUT CARDIOVERSION N/A 03/03/2021   Procedure: TRANSESOPHAGEAL ECHOCARDIOGRAM (TEE);  Surgeon: Vickie Epley, MD;  Location: [place] [facility];  Service: Cardiovascular;  Laterality: N/A;    Current Medications: Current Meds  Medication Sig   amiodarone (PACERONE) 200 MG tablet Take 1 tablet (200 mg total) by mouth 2 (two) times daily.   apixaban (ELIQUIS) 5 MG TABS tablet Take 1 tablet (5 mg total) by mouth 2 (two) times daily.   dapagliflozin propanediol (FARXIGA) 10 MG TABS tablet Take 1 tablet (10 mg total) by mouth daily before breakfast.   glipiZIDE (GLUCOTROL XL) 10 MG 24 hr tablet TAKE ONE TABLET BY MOUTH EVERY DAY   HYDROmorphone (DILAUDID) 4 MG tablet Take 4 mg by mouth See admin instructions. Brand name Dilaudid 4 mg - 4 mg 3-4 times daily (RX written for 4 times daily)   metFORMIN (GLUCOPHAGE) 1000 MG tablet TAKE 1 TABLET BY MOUTH TWICE DAILY   NARCAN 4 MG/0.1ML LIQD nasal spray kit Place 0.4 mg into the nose as needed (accidental overdose).   OXYCONTIN 20 MG 12 hr tablet Take 1 tablet (20 mg total)  by mouth every 12 (twelve) hours. (Patient taking differently: Take 20 mg by mouth 4 (four) times daily.)   potassium chloride SA (KLOR-CON M) 20 MEQ tablet Take 1 tablet (20 mEq total) by mouth daily.   pregabalin (LYRICA) 100 MG capsule Take 100 mg by mouth 3 (three) times daily.   simvastatin (ZOCOR) 40 MG tablet TAKE 1 TABLET BY MOUTH ONCE DAILY.   tamsulosin (FLOMAX) 0.4 MG CAPS capsule TAKE ONE CAPSULE BY MOUTH EVERY DAY   torsemide (DEMADEX) 20 MG tablet Take 1 tablet (20 mg total) by mouth 2 (two) times daily.   zolpidem (AMBIEN CR) 12.5  MG CR tablet Take 12.5 mg by mouth at bedtime.    [DISCONTINUED] losartan (COZAAR) 25 MG tablet Take 1 tablet (25 mg total) by mouth daily.   [DISCONTINUED] metoprolol (TOPROL XL) 200 MG 24 hr tablet Take 1 tablet (200 mg total) by mouth daily.     Allergies:   Other   Social History   Socioeconomic History   Marital status: Single    Spouse name: Not on file   Number of children: 1   Years of education: Not on file   Highest education level: Some college, no degree  Occupational History   Occupation: disability/retired  Tobacco Use   Smoking status: Never    Passive exposure: Past   Smokeless tobacco: Never  Vaping Use   Vaping Use: Never used  Substance and Sexual Activity   Alcohol use: No    Alcohol/week: 0.0 standard drinks of alcohol   Drug use: No   Sexual activity: Not on file  Other Topics Concern   Not on file  Social History Narrative   Lives with Mother [name].    Social Determinants of Health   Financial Resource Strain: Low Risk  (06/17/2022)   Overall Financial Resource Strain (CARDIA)    Difficulty of Paying Living Expenses: Not very hard  Food Insecurity: No Food Insecurity (06/17/2022)   Hunger Vital Sign    Worried About Running Out of Food in the Last Year: Never true    Ran Out of Food in the Last Year: Never true  Transportation Needs: No Transportation Needs (06/17/2022)   PRAPARE - Hydrologist (Medical): No    Lack of Transportation (Non-Medical): No  Physical Activity: Inactive (06/17/2022)   Exercise Vital Sign    Days of Exercise per Week: 0 days    Minutes of Exercise per Session: 0 min  Stress: No Stress Concern Present (06/17/2022)   [place]    Feeling of Stress : Only a little  Social Connections: Socially Isolated (06/17/2022)   Social Connection and Isolation Panel [NHANES]    Frequency of Communication with Friends and Family: More than  three times a week    Frequency of Social Gatherings with Friends and Family: Once a week    Attends Religious Services: Never    Marine scientist or Organizations: No    Attends Music therapist: Never    Marital Status: Divorced     Family History: The patient's family history includes Diabetes in his mother and sister.  ROS:   Please see the history of present illness.     All other systems reviewed and are negative.  EKGs/Labs/Other Studies Reviewed:    The following studies were reviewed today:   EKG:  EKG is ordered today.  EKG shows atrial fibrillation, heart rate 117.  Recent  Labs: 01/14/2022: TSH 3.762 01/20/2022: B Natriuretic Peptide 1,077.6 01/25/2022: Magnesium 2.1 02/17/2022: ALT 17; BUN 11; Creatinine, Ser 1.07; Hemoglobin 12.7; Platelets 195; Potassium 4.0; Sodium 137  Recent Lipid Panel    Component Value Date/Time   CHOL 177 10/16/2020 1046   TRIG 117 01/16/2022 0358   HDL 46 10/16/2020 1046   CHOLHDL 3.8 10/16/2020 1046   LDLCALC 102 (H) 10/16/2020 1046    Physical Exam:    VS:  BP 130/80 (BP Location: Left Arm, Patient Position: Sitting, Cuff Size: Normal)   Pulse (!) 117   Ht 6' (1.829 m)   Wt (!) 336 lb 6.4 oz (152.6 kg)   SpO2 92%   BMI 45.62 kg/m     Wt Readings from Last 3 Encounters:  07/20/22 (!) 336 lb 6.4 oz (152.6 kg)  06/17/22 (!) 362 lb (164.2 kg)  02/27/22 (!) 362 lb (164.2 kg)     GEN:  Well nourished, well developed in no acute distress HEENT: Normal NECK: No JVD; No carotid bruits LYMPHATICS: No lymphadenopathy CARDIAC: Irregular irregular, non tachycardic, no murmurs, rubs, gallops RESPIRATORY:  Clear to auscultation without rales, wheezing or rhonchi  ABDOMEN: Soft, non-tender, non-distended MUSCULOSKELETAL:  trace-1+ edema; No deformity  SKIN: Warm and dry NEUROLOGIC:  Alert and oriented x 3 PSYCHIATRIC:  Normal affect   ASSESSMENT:    1. Permanent atrial fibrillation ([place])   2. NICM (nonischemic  cardiomyopathy) ([place])   3. Coronary artery disease, non-occlusive    PLAN:    In order of problems listed above:  Permanent atrial fibrillation, heart rate elevated at 117. CHA2DS2-VASc score at 3 (chf,htn. DM).  Continue Toprol-XL 200 mg daily, Eliquis 14m twice daily.  Start amiodarone 200 mg twice daily x 6 weeks. Will attempt repeat DC cardioversion after.  Plan to stop amiodarone if DC cardioversion is unsuccessful.  Consider adding digoxin if heart rate not controlled. previously Failed DC cardioversion, morbid obesity not permitting RFA as an option.  Continue rate control strategy.  NICM, current EF slightly improved to 35-40%. NYHA class II-III symptoms.  Etiology possibly tachycardia induced.  Continue Toprol-XL 2038mdaily.  Continue losartan 25 mg daily, mg daily, torsemide 20 mg twice daily,  Farxiga 10 mg daily.   Not on Entresto due to cost issues Nonobstructive CAD, 60% mid LAD, 30% diagonal.  Continue Eliquis, statin.  Follow-up in 6 weeks  Total encounter time 40  minutes  Greater than 50% was spent in counseling and coordination of care with the patient   This note was generated in part or whole with voice recognition software. Voice recognition is usually quite accurate but there are transcription errors that can and very often do occur. I apologize for any typographical errors that were not detected and corrected.  Medication Adjustments/Labs and Tests Ordered: Current medicines are reviewed at length with the patient today.  Concerns regarding medicines are outlined above.  Orders Placed This Encounter  Procedures   EKG 12-Lead    Meds ordered this encounter  Medications   losartan (COZAAR) 25 MG tablet    Sig: Take 1 tablet (25 mg total) by mouth daily.    Dispense:  90 tablet    Refill:  3   metoprolol (TOPROL XL) 200 MG 24 hr tablet    Sig: Take 1 tablet (200 mg total) by mouth daily.    Dispense:  90 tablet    Refill:  2   amiodarone (PACERONE) 200 MG  tablet    Sig: Take 1 tablet (200  mg total) by mouth 2 (two) times daily.    Dispense:  180 tablet    Refill:  0     Patient Instructions  Medication Instructions:   1. START AMIODARONE - Take one tablet (200 mg) by mouth twice a day.   *If you need a refill on your cardiac medications before your next appointment, please call your pharmacy*   Lab Work:  None Ordered  If you have labs (blood work) drawn today and your tests are completely normal, you will receive your results only by: [place] (if you have MyChart) OR A paper copy in the mail If you have any lab test that is abnormal or we need to change your treatment, we will call you to review the results.   Testing/Procedures:  None Ordered   Follow-Up: At [facility], you and your health needs are our priority.  As part of our continuing mission to provide you with exceptional heart care, we have created designated Provider Care Teams.  These Care Teams include your primary Cardiologist (physician) and Advanced Practice Providers (APPs -  Physician Assistants and Nurse Practitioners) who all work together to provide you with the care you need, when you need it.  We recommend signing up for the patient portal called "MyChart".  Sign up information is provided on this After Visit Summary.  MyChart is used to connect with patients for Virtual Visits (Telemedicine).  Patients are able to view lab/test results, encounter notes, upcoming appointments, etc.  Non-urgent messages can be sent to your provider as well.   To learn more about what you can do with MyChart, go to [email or web address].    Your next appointment:   6 week(s)  Provider:   Kate Sable, MD ONLY      Signed, Kate Sable, MD  07/20/2022 10:05 AM    [place]

## 2022-07-28 ENCOUNTER — Other Ambulatory Visit: Payer: Self-pay | Admitting: Family Medicine

## 2022-08-04 ENCOUNTER — Telehealth: Payer: Self-pay | Admitting: Family Medicine

## 2022-08-04 NOTE — Telephone Encounter (Signed)
Home Health Verbal Orders - Caller/Agency: patient/Taylor [name] [phone] Requesting needs help at home pca services/requesting services at [place] with his sister [name]   Frequency: ?

## 2022-08-04 NOTE — Progress Notes (Unsigned)
I,Sulibeya S Dimas,acting as a scribe for Lelon Huh, MD.,have documented all relevant documentation on the behalf of Lelon Huh, MD,as directed by  Lelon Huh, MD while in the presence of Lelon Huh, MD.      Established patient visit   Patient: Taylor [name]   DOB: 02-22-1956   67 y.o. Male  MRN: [id number] Visit Date: 08/05/2022  Today's healthcare provider: Lelon Huh, MD   Chief Complaint  Patient presents with   Diabetes   URI   Subjective    HPI HPI     URI           URI symptoms: congestion, cough, plugged ear sensation and shortness of breath   Onset: in the past 7 days   Progression since onset: unchanged since onset   Fever: temperature has been with in normal range       Last edited by Dorian Pod, [place] on 08/05/2022 11:36 AM.      Diabetes Mellitus Type II, Follow-up  Lab Results  Component Value Date   HGBA1C 13.4 (A) 08/05/2022   HGBA1C 9.9 (H) 01/14/2022   HGBA1C 6.6 (A) 02/18/2021   Wt Readings from Last 3 Encounters:  08/05/22 (!) 339 lb (153.8 kg)  07/20/22 (!) 336 lb 6.4 oz (152.6 kg)  06/17/22 (!) 362 lb (164.2 kg)   Last seen for diabetes 5 months ago.  Management since then includes no changes. He reports excellent compliance with treatment. He is not having side effects.   Home blood sugar records:  patient does not remember readings  Episodes of hypoglycemia? No    Current insulin regiment: none   Pertinent Labs: Lab Results  Component Value Date   CHOL 177 10/16/2020   HDL 46 10/16/2020   LDLCALC 102 (H) 10/16/2020   TRIG 117 01/16/2022   CHOLHDL 3.8 10/16/2020   Lab Results  Component Value Date   NA 137 02/17/2022   K 4.0 02/17/2022   CREATININE 1.07 02/17/2022   EGFR 77 02/17/2022   MICRALBCREAT n/a 03/31/2017     --------------------------------------------------------------------------------------------------- Patient requesting home health order for help at home, he gets very  short of breath with exertion, has history of CHF. He is requesting orders go to [facility].   Medications: Outpatient Medications Prior to Visit  Medication Sig   apixaban (ELIQUIS) 5 MG TABS tablet Take 1 tablet (5 mg total) by mouth 2 (two) times daily.   dapagliflozin propanediol (FARXIGA) 10 MG TABS tablet Take 1 tablet (10 mg total) by mouth daily before breakfast.   glipiZIDE (GLUCOTROL XL) 10 MG 24 hr tablet TAKE ONE TABLET BY MOUTH EVERY DAY   losartan (COZAAR) 25 MG tablet Take 1 tablet (25 mg total) by mouth daily.   metFORMIN (GLUCOPHAGE) 1000 MG tablet Take 1 tablet (1,000 mg total) by mouth 2 (two) times daily. Please schedule office visit before any future refills   metoprolol (TOPROL XL) 200 MG 24 hr tablet Take 1 tablet (200 mg total) by mouth daily.   NARCAN 4 MG/0.1ML LIQD nasal spray kit Place 0.4 mg into the nose as needed (accidental overdose).   OXYCONTIN 20 MG 12 hr tablet Take 1 tablet (20 mg total) by mouth every 12 (twelve) hours. (Patient taking differently: Take 20 mg by mouth 4 (four) times daily.)   potassium chloride SA (KLOR-CON M) 20 MEQ tablet Take 1 tablet (20 mEq total) by mouth daily.   pregabalin (LYRICA) 100 MG capsule Take 100 mg by mouth 3 (three) times  daily.   simvastatin (ZOCOR) 40 MG tablet TAKE 1 TABLET BY MOUTH ONCE DAILY.   torsemide (DEMADEX) 20 MG tablet Take 1 tablet (20 mg total) by mouth 2 (two) times daily.   zolpidem (AMBIEN CR) 12.5 MG CR tablet Take 12.5 mg by mouth at bedtime.    amiodarone (PACERONE) 200 MG tablet Take 1 tablet (200 mg total) by mouth 2 (two) times daily. (Patient not taking: Reported on 08/05/2022)   HYDROmorphone (DILAUDID) 4 MG tablet Take 4 mg by mouth See admin instructions. Brand name Dilaudid 4 mg - 4 mg 3-4 times daily (RX written for 4 times daily) (Patient not taking: Reported on 08/05/2022)   tamsulosin (FLOMAX) 0.4 MG CAPS capsule Take 1 capsule (0.4 mg total) by mouth daily. Please schedule office visit  before any future refills (Patient not taking: Reported on 08/05/2022)   No facility-administered medications prior to visit.    Review of Systems  Constitutional:  Negative for chills and fever.  Eyes:  Negative for visual disturbance.  Respiratory:  Positive for cough, chest tightness and shortness of breath. Negative for wheezing.   Cardiovascular:  Positive for leg swelling. Negative for chest pain.  Neurological:  Positive for numbness.       Objective    BP 133/88 (BP Location: Right Arm, Patient Position: Sitting, Cuff Size: Large)   Pulse (!) 113   Temp 97.7 F (36.5 C) (Temporal)   Resp 20   Wt (!) 339 lb (153.8 kg)   SpO2 97%   BMI 45.98 kg/m    Physical Exam    General: Appearance:    Severely obese male in no acute distress  Eyes:    PERRL, conjunctiva/corneas clear, EOM's intact       Lungs:     Clear to auscultation bilaterally, respirations unlabored  Heart:    Tachycardic. Irregularly irregular rhythm. No murmurs, rubs, or gallops.    MS:   All extremities are intact.    Neurologic:   Awake, alert, oriented x 3. No apparent focal neurological defect.         Results for orders placed or performed in visit on 08/05/22  POCT glycosylated hemoglobin (Hb A1C)  Result Value Ref Range   Hemoglobin A1C 13.4 (A) 4.0 - 5.6 %    Assessment & Plan     1. Subacute cough   2. COVID-19  - molnupiravir EUA (LAGEVRIO) 200 mg CAPS capsule; Take 4 capsules (800 mg total) by mouth 2 (two) times daily for 5 days.  Dispense: 40 capsule; Refill: 0  3. Type 2 diabetes mellitus with diabetic neuropathy, with long-term current use of insulin (HCC) Uncontrolled. Previously did not tolerate samples of Rybelsus due to nausea. Consider trial of Mounjaro which appearts to be on his formulary, but currently not available at any local pharmacies.   For now will increase glipizide to BID  - Microalbumin / creatinine urine ratio - CBC - Comprehensive metabolic panel - Lipid  panel - Magnesium   4. Morbid obesity with BMI of 40.0-44.9, adult ([place]) Will discuss trial of Mounjaro at follow up appointment.   5. Poor memory  - Vitamin B12  6. Primary hypertension Fairly well controlled.   7. Coronary artery disease involving native coronary artery of native heart without angina pectoris Asymptomatic. Compliant with medication.  Continue aggressive risk factor modification.    8. HFrEF (heart failure with reduced ejection fraction) ([place])   9. DDD (degenerative disc disease), lumbar   10. Spondylosis without myelopathy or  radiculopathy, lumbosacral region   11. Limited mobility Refer Home Health   Follow up for DM in 4 weeks.      The entirety of the information documented in the History of Present Illness, Review of Systems and Physical Exam were personally obtained by me. Portions of this information were initially documented by the CMA and reviewed by me for thoroughness and accuracy.     Lelon Huh, MD  [place] [phone] (phone) [phone] (fax)  [place]

## 2022-08-05 ENCOUNTER — Telehealth: Payer: Self-pay | Admitting: Family Medicine

## 2022-08-05 ENCOUNTER — Encounter: Payer: Self-pay | Admitting: Family Medicine

## 2022-08-05 ENCOUNTER — Ambulatory Visit (INDEPENDENT_AMBULATORY_CARE_PROVIDER_SITE_OTHER): Payer: Medicare HMO | Admitting: Family Medicine

## 2022-08-05 VITALS — BP 133/88 | HR 113 | Temp 97.7°F | Resp 20 | Wt 339.0 lb

## 2022-08-05 DIAGNOSIS — E1140 Type 2 diabetes mellitus with diabetic neuropathy, unspecified: Secondary | ICD-10-CM

## 2022-08-05 DIAGNOSIS — Z794 Long term (current) use of insulin: Secondary | ICD-10-CM | POA: Diagnosis not present

## 2022-08-05 DIAGNOSIS — U071 COVID-19: Secondary | ICD-10-CM | POA: Diagnosis not present

## 2022-08-05 DIAGNOSIS — M47817 Spondylosis without myelopathy or radiculopathy, lumbosacral region: Secondary | ICD-10-CM | POA: Diagnosis not present

## 2022-08-05 DIAGNOSIS — M5136 Other intervertebral disc degeneration, lumbar region: Secondary | ICD-10-CM | POA: Diagnosis not present

## 2022-08-05 DIAGNOSIS — I2510 Atherosclerotic heart disease of native coronary artery without angina pectoris: Secondary | ICD-10-CM

## 2022-08-05 DIAGNOSIS — Z6841 Body Mass Index (BMI) 40.0 and over, adult: Secondary | ICD-10-CM

## 2022-08-05 DIAGNOSIS — I10 Essential (primary) hypertension: Secondary | ICD-10-CM

## 2022-08-05 DIAGNOSIS — I5020 Unspecified systolic (congestive) heart failure: Secondary | ICD-10-CM

## 2022-08-05 DIAGNOSIS — R413 Other amnesia: Secondary | ICD-10-CM | POA: Diagnosis not present

## 2022-08-05 DIAGNOSIS — R052 Subacute cough: Secondary | ICD-10-CM | POA: Diagnosis not present

## 2022-08-05 DIAGNOSIS — Z7409 Other reduced mobility: Secondary | ICD-10-CM

## 2022-08-05 LAB — POC COVID19 BINAXNOW: SARS Coronavirus 2 Ag: POSITIVE — AB

## 2022-08-05 LAB — POCT GLYCOSYLATED HEMOGLOBIN (HGB A1C): Hemoglobin A1C: 13.4 % — AB (ref 4.0–5.6)

## 2022-08-05 MED ORDER — MOLNUPIRAVIR EUA 200MG CAPSULE: 4.0000 | ORAL_CAPSULE | Freq: Two times a day (BID) | ORAL | 0 refills | Status: AC

## 2022-08-05 MED ORDER — GLIPIZIDE ER 10 MG PO TB24: 10.0000 mg | ORAL_TABLET | Freq: Two times a day (BID) | ORAL | 4 refills | Status: DC

## 2022-08-05 NOTE — Patient Instructions (Addendum)
Please review the attached list of medications and notify my office if there are any errors.   Please go to the lab draw station in [address] on the second floor of [facility]  when you are fasting for 8 hours. Normal hours are 8:00am to 11:30am and 1:00pm to 4:00pm Monday through Friday. Wait until you finish the Covid medication before going to the lab  I sent a prescription for molnupiravir to your pharmacy for treatment of Covid.   Start taking glipizide twice a day at meal time.

## 2022-08-05 NOTE — Telephone Encounter (Signed)
That's fine

## 2022-08-05 NOTE — Telephone Encounter (Signed)
Patient is suppose to talk to you about getting home health.   His sister is a Quarry manager and works for [employer] and she said if you could put referral into [facility] -[phone], they would get [facility] and help [place].

## 2022-08-06 NOTE — Telephone Encounter (Signed)
Please see contact info below, order for home health was placed yesterday. Thanks/1

## 2022-08-07 LAB — MICROALBUMIN / CREATININE URINE RATIO
Creatinine, Urine: 57.6 mg/dL
Microalb/Creat Ratio: 11 mg/g creat (ref 0–29)
Microalbumin, Urine: 6.3 ug/mL

## 2022-09-02 ENCOUNTER — Ambulatory Visit: Payer: Medicare HMO | Admitting: Cardiology

## 2022-09-08 ENCOUNTER — Ambulatory Visit: Payer: Medicare HMO | Attending: Cardiology | Admitting: Cardiology

## 2022-09-08 ENCOUNTER — Encounter: Payer: Self-pay | Admitting: Cardiology

## 2022-09-28 ENCOUNTER — Other Ambulatory Visit: Payer: Self-pay | Admitting: Family Medicine

## 2022-10-09 ENCOUNTER — Telehealth: Payer: Self-pay | Admitting: Family Medicine

## 2022-10-09 NOTE — Telephone Encounter (Signed)
LVM advising patient samples will bve available at front desk Monday morning

## 2022-10-09 NOTE — Telephone Encounter (Signed)
He can have 4 boxes of 10mg  tablets

## 2022-10-09 NOTE — Telephone Encounter (Signed)
Copied from CRM [id number]. Topic: General - Other >> Oct 09, 2022  1:01 PM Dominique A wrote: Reason for CRM: Pt is calling to see if PCP has any samples of Farxiga. Pt states that the medication is $400 at the pharmacy and he cannot afford this. Please call the pt back.

## 2022-10-12 ENCOUNTER — Telehealth: Payer: Self-pay | Admitting: *Deleted

## 2022-10-12 MED ORDER — APIXABAN 5 MG PO TABS: 5.0000 mg | ORAL_TABLET | Freq: Two times a day (BID) | ORAL | 0 refills | Status: DC

## 2022-10-12 NOTE — Telephone Encounter (Signed)
Spoke with patient and reviewed patient assistance application and necessary documents that he needs to complete. Samples given pending completion of his forms.

## 2022-10-13 NOTE — Telephone Encounter (Signed)
Application completed and placed in my "Pending Patient Assistance" folder on the bottom of my cart. Patient is aware of the necessary documents in order to submit application. No further needs at this time.

## 2022-10-23 ENCOUNTER — Other Ambulatory Visit: Payer: Self-pay | Admitting: Family Medicine

## 2022-10-23 NOTE — Telephone Encounter (Signed)
Requested medication (s) are due for refill today: yes  Requested medication (s) are on the active medication list:yes  Last refill:  05/12/22 #90  Future visit scheduled: no   Notes to clinic:  overdue lab work   Requested Prescriptions  Pending Prescriptions Disp Refills   simvastatin (ZOCOR) 40 MG tablet [Pharmacy Med Name: simvastatin 40 mg tablet] 90 tablet     Sig: TAKE ONE TABLET BY MOUTH ONCE DAILY     Cardiovascular:  Antilipid - Statins Failed - 10/23/2022 12:32 PM      Failed - Lipid Panel in normal range within the last 12 months    Cholesterol, Total  Date Value Ref Range Status  10/16/2020 177 100 - 199 mg/dL Final   LDL Chol Calc (NIH)  Date Value Ref Range Status  10/16/2020 102 (H) 0 - 99 mg/dL Final   HDL  Date Value Ref Range Status  10/16/2020 46 >39 mg/dL Final   Triglycerides  Date Value Ref Range Status  01/16/2022 117 <150 mg/dL Final    Comment:    Performed at [facility], [address] [place], [place] [address]         Passed - Patient is not pregnant      Passed - Valid encounter within last 12 months    Recent Outpatient Visits           2 months ago Subacute cough   [facility] Malva Limes, MD   8 months ago Acute renal failure, unspecified acute renal failure type [facility])   [facility] Malva Limes, MD   1 year ago Primary hypertension   [facility] Malva Limes, MD   1 year ago Type 2 diabetes mellitus with diabetic neuropathy, with long-term current use of insulin (HCC)    [facility] Malva Limes, MD   2 years ago Annual physical exam   [facility] Malva Limes, MD              Refused Prescriptions Disp Refills   tamsulosin (FLOMAX) 0.4 MG CAPS capsule [Pharmacy Med Name: tamsulosin 0.4 mg capsule] 30 capsule     Sig: TAKE ONE CAPSULE BY MOUTH EVERY DAY      Urology: Alpha-Adrenergic Blocker Failed - 10/23/2022 12:32 PM      Failed - PSA in normal range and within 360 days    Prostate Specific Ag, Serum  Date Value Ref Range Status  10/16/2020 0.3 0.0 - 4.0 ng/mL Final    Comment:    Roche ECLIA methodology. According to the American Urological Association, Serum PSA should decrease and remain at undetectable levels after radical prostatectomy. The AUA defines biochemical recurrence as an initial PSA value 0.2 ng/mL or greater followed by a subsequent confirmatory PSA value 0.2 ng/mL or greater. Values obtained with different assay methods or kits cannot be used interchangeably. Results cannot be interpreted as absolute evidence of the presence or absence of malignant disease.          Passed - Last BP in normal range    BP Readings from Last 1 Encounters:  08/05/22 133/88         Passed - Valid encounter within last 12 months    Recent Outpatient Visits           2 months ago Subacute cough   [facility] Malva Limes, MD  8 months ago Acute renal failure, unspecified acute renal failure type [facility])   [facility] Malva Limes, MD   1 year ago Primary hypertension   [facility] Malva Limes, MD   1 year ago Type 2 diabetes mellitus with diabetic neuropathy, with long-term current use of insulin [facility])   [place] [facility] Malva Limes, MD   2 years ago Annual physical exam   [facility] Malva Limes, MD

## 2022-10-23 NOTE — Telephone Encounter (Signed)
Requested Prescriptions  Pending Prescriptions Disp Refills   simvastatin (ZOCOR) 40 MG tablet [Pharmacy Med Name: simvastatin 40 mg tablet] 90 tablet     Sig: TAKE ONE TABLET BY MOUTH ONCE DAILY     Cardiovascular:  Antilipid - Statins Failed - 10/23/2022 12:32 PM      Failed - Lipid Panel in normal range within the last 12 months    Cholesterol, Total  Date Value Ref Range Status  10/16/2020 177 100 - 199 mg/dL Final   LDL Chol Calc (NIH)  Date Value Ref Range Status  10/16/2020 102 (H) 0 - 99 mg/dL Final   HDL  Date Value Ref Range Status  10/16/2020 46 >39 mg/dL Final   Triglycerides  Date Value Ref Range Status  01/16/2022 117 <150 mg/dL Final    Comment:    Performed at [facility], [address] [place], [place] [address]         Passed - Patient is not pregnant      Passed - Valid encounter within last 12 months    Recent Outpatient Visits           2 months ago Subacute cough   [place] [facility] Malva Limes, MD   8 months ago Acute renal failure, unspecified acute renal failure type [facility])   [place] [facility] Malva Limes, MD   1 year ago Primary hypertension   [place] [facility] Malva Limes, MD   1 year ago Type 2 diabetes mellitus with diabetic neuropathy, with long-term current use of insulin (HCC)   [place] [facility] Malva Limes, MD   2 years ago Annual physical exam   [facility] Malva Limes, MD               tamsulosin (FLOMAX) 0.4 MG CAPS capsule [Pharmacy Med Name: tamsulosin 0.4 mg capsule] 30 capsule     Sig: TAKE ONE CAPSULE BY MOUTH EVERY DAY     Urology: Alpha-Adrenergic Blocker Failed - 10/23/2022 12:32 PM      Failed - PSA in normal range and within 360 days    Prostate Specific Ag, Serum  Date Value Ref Range Status  10/16/2020 0.3 0.0 - 4.0 ng/mL Final    Comment:    Roche ECLIA  methodology. According to the American Urological Association, Serum PSA should decrease and remain at undetectable levels after radical prostatectomy. The AUA defines biochemical recurrence as an initial PSA value 0.2 ng/mL or greater followed by a subsequent confirmatory PSA value 0.2 ng/mL or greater. Values obtained with different assay methods or kits cannot be used interchangeably. Results cannot be interpreted as absolute evidence of the presence or absence of malignant disease.          Passed - Last BP in normal range    BP Readings from Last 1 Encounters:  08/05/22 133/88         Passed - Valid encounter within last 12 months    Recent Outpatient Visits           2 months ago Subacute cough   [place] [facility], [facility]   8 months ago Acute renal failure, unspecified acute renal failure type [facility])   [place] [facility], [facility]   1 year ago Primary hypertension   [facility], [facility]   1 year  ago Type 2 diabetes mellitus with diabetic neuropathy, with long-term current use of insulin (HCC)   [place] [facility] Malva Limes, MD   2 years ago Annual physical exam   [facility] Malva Limes, MD

## 2022-10-26 ENCOUNTER — Other Ambulatory Visit: Payer: Self-pay | Admitting: Family Medicine

## 2022-10-27 NOTE — Telephone Encounter (Signed)
Requested Prescriptions  Pending Prescriptions Disp Refills   tamsulosin (FLOMAX) 0.4 MG CAPS capsule [Pharmacy Med Name: tamsulosin 0.4 mg capsule] 30 capsule 0    Sig: TAKE ONE CAPSULE BY MOUTH EVERY DAY     Urology: Alpha-Adrenergic Blocker Failed - 10/26/2022 10:26 AM      Failed - PSA in normal range and within 360 days    Prostate Specific Ag, Serum  Date Value Ref Range Status  10/16/2020 0.3 0.0 - 4.0 ng/mL Final    Comment:    Roche ECLIA methodology. According to the American Urological Association, Serum PSA should decrease and remain at undetectable levels after radical prostatectomy. The AUA defines biochemical recurrence as an initial PSA value 0.2 ng/mL or greater followed by a subsequent confirmatory PSA value 0.2 ng/mL or greater. Values obtained with different assay methods or kits cannot be used interchangeably. Results cannot be interpreted as absolute evidence of the presence or absence of malignant disease.          Passed - Last BP in normal range    BP Readings from Last 1 Encounters:  08/05/22 133/88         Passed - Valid encounter within last 12 months    Recent Outpatient Visits           2 months ago Subacute cough   [place] [facility] Malva Limes, [facility]   8 months ago Acute renal failure, unspecified acute renal failure type [facility])   [place] [facility] Malva Limes, MD   1 year ago Primary hypertension   [place] [facility] Malva Limes, MD   1 year ago Type 2 diabetes mellitus with diabetic neuropathy, with long-term current use of insulin (HCC)   [place] [facility] Malva Limes, MD   2 years ago Annual physical exam   [facility] Malva Limes, MD

## 2022-11-05 ENCOUNTER — Telehealth: Payer: Self-pay | Admitting: Family Medicine

## 2022-11-05 DIAGNOSIS — I5020 Unspecified systolic (congestive) heart failure: Secondary | ICD-10-CM

## 2022-11-06 ENCOUNTER — Encounter: Payer: Self-pay | Admitting: Family Medicine

## 2022-11-06 NOTE — Telephone Encounter (Signed)
Patient reports he is not taking tamsulosin and did not request refill.

## 2022-11-06 NOTE — Telephone Encounter (Signed)
Requested medications are due for refill today.  unsure  Requested medications are on the active medications list.  no  Last refill. 07/29/2022 #30 0 rf  Future visit scheduled.   no  Notes to clinic.  Med was discontinued 08/05/2022 - pt no longer taking. 3rd request from pharmacy. Please review for refill.    Requested Prescriptions  Pending Prescriptions Disp Refills   tamsulosin (FLOMAX) 0.4 MG CAPS capsule [Pharmacy Med Name: tamsulosin 0.4 mg capsule] 30 capsule 0    Sig: TAKE ONE CAPSULE BY MOUTH EVERY DAY     Urology: Alpha-Adrenergic Blocker Failed - 11/05/2022  3:24 PM      Failed - PSA in normal range and within 360 days    Prostate Specific Ag, Serum  Date Value Ref Range Status  10/16/2020 0.3 0.0 - 4.0 ng/mL Final    Comment:    Roche ECLIA methodology. According to the American Urological Association, Serum PSA should decrease and remain at undetectable levels after radical prostatectomy. The AUA defines biochemical recurrence as an initial PSA value 0.2 ng/mL or greater followed by a subsequent confirmatory PSA value 0.2 ng/mL or greater. Values obtained with different assay methods or kits cannot be used interchangeably. Results cannot be interpreted as absolute evidence of the presence or absence of malignant disease.          Passed - Last BP in normal range    BP Readings from Last 1 Encounters:  08/05/22 133/88         Passed - Valid encounter within last 12 months    Recent Outpatient Visits           3 months ago Subacute cough   [place] [facility] Mal[facility], MD   8 months ago Acute renal failure, unspecified acute renal failure type [facility])   [place] [facility] Malva L[facility], MD   1 year ago Primary hypertension   [place] [facility] Mal[facility] Limes, MD   1 year ago Type 2 diabetes mellitus with diabetic neuropathy, with long-term current use of insulin (HCC)   [place]  [facility] Malva Limes, MD   2 years ago Annual physical exam   [facility] Malva Limes, MD

## 2022-11-06 NOTE — Telephone Encounter (Signed)
Patient had stopped taking tamsulosin at his last visit in February. Has he started back on it? Or is he wanting to starting back on it?

## 2022-11-12 NOTE — Telephone Encounter (Signed)
Pt is asking if PCP has any samples of Farxiga. States that the medication is $400 at the pharmacy, and he cannot afford this. He mentioned there is another medication Dr. Sherrie Mustache has given him samples of, and he is requesting samples of that as well if possible, but pt stated he does not remember the name of the medication.  Pt is requesting a callback. Please advise.

## 2022-11-19 ENCOUNTER — Other Ambulatory Visit: Payer: Self-pay | Admitting: Family Medicine

## 2022-11-20 NOTE — Telephone Encounter (Signed)
Requested Prescriptions  Pending Prescriptions Disp Refills   metFORMIN (GLUCOPHAGE) 1000 MG tablet [Pharmacy Med Name: metformin 1,000 mg tablet] 180 tablet     Sig: TAKE ONE TABLET BY MOUTH TWICE DAILY     Endocrinology:  Diabetes - Biguanides Failed - 11/19/2022  4:41 PM      Failed - HBA1C is between 0 and 7.9 and within 180 days    Hemoglobin A1C  Date Value Ref Range Status  08/05/2022 13.4 (A) 4.0 - 5.6 % Final   Hgb A1c MFr Bld  Date Value Ref Range Status  01/14/2022 9.9 (H) 4.8 - 5.6 % Final    Comment:    (NOTE) Pre diabetes:          5.7%-6.4%  Diabetes:              >6.4%  Glycemic control for   <7.0% adults with diabetes          Failed - B12 Level in normal range and within 720 days    No results found for: "VITAMINB12"       Passed - Cr in normal range and within 360 days    Creatinine, Ser  Date Value Ref Range Status  02/17/2022 1.07 0.76 - 1.27 mg/dL Final   Creatinine, POC  Date Value Ref Range Status  03/31/2017 n/a mg/dL Final   Creatinine, Urine  Date Value Ref Range Status  01/14/2022 209 mg/dL Final         Passed - eGFR in normal range and within 360 days    GFR calc Af Amer  Date Value Ref Range Status  03/25/2020 71 >59 mL/min/1.73 Final    Comment:    **In accordance with recommendations from the NKF-ASN Task force,**   [facility] is in the process of updating its eGFR calculation to the   2021 CKD-EPI creatinine equation that estimates kidney function   without a race variable.    GFR, Estimated  Date Value Ref Range Status  01/26/2022 >60 >60 mL/min Final    Comment:    (NOTE) Calculated using the CKD-EPI Creatinine Equation (2021)    eGFR  Date Value Ref Range Status  02/17/2022 77 >59 mL/min/1.73 Final         Passed - Valid encounter within last 6 months    Recent Outpatient Visits           3 months ago Subacute cough   [place] [facility] Mal[facility], MD   9 months ago Acute renal failure,  unspecified acute renal failure type [facility]   [place] [facility] Malva L[facility], MD   1 year ago Primary hypertension   [place] [facility] Malva L[facility], MD   1 year ago Type 2 diabetes mellitus with diabetic neuropathy, with long-term current use of insulin (HCC)   [place], [facility]   2 years ago Annual physical exam   [facility], [facility]              Passed - CBC within normal limits and completed in the last 12 months    WBC  Date Value Ref Range Status  02/17/2022 8.4 3.4 - 10.8 x10E3/uL Final  01/23/2022 8.9 4.0 - 10.5 K/uL Final   RBC  Date Value Ref Range Status  02/17/2022 4.69 4.14 - 5.80 x10E6/uL Final  01/23/2022 4.52 4.22 - 5.81 MIL/uL Final   Hemoglobin  Date Value Ref Range Status  02/17/2022 12.7 (L) 13.0 - 17.7 g/dL Final   Hematocrit  Date Value Ref Range Status  02/17/2022 40.9 37.5 - 51.0 % Final   MCHC  Date Value Ref Range Status  02/17/2022 31.1 (L) 31.5 - 35.7 g/dL Final  16/03/9603 54.0 30.0 - 36.0 g/dL Final   [facility]  Date Value Ref Range Status  02/17/2022 27.1 26.6 - 33.0 pg Final  01/23/2022 28.5 26.0 - 34.0 pg Final   MCV  Date Value Ref Range Status  02/17/2022 87 79 - 97 fL Final   No results found for: "PLTCOUNTKUC", "LABPLAT", "POCPLA" RDW  Date Value Ref Range Status  02/17/2022 15.0 11.6 - 15.4 % Final         Refused Prescriptions Disp Refills   tamsulosin (FLOMAX) 0.4 MG CAPS capsule [Pharmacy Med Name: tamsulosin 0.4 mg capsule] 30 capsule     Sig: TAKE ONE CAPSULE BY MOUTH EVERY DAY     Urology: Alpha-Adrenergic Blocker Failed - 11/19/2022  4:41 PM      Failed - PSA in normal range and within 360 days    Prostate Specific Ag, Serum  Date Value Ref Range Status  10/16/2020 0.3 0.0 - 4.0 ng/mL Final    Comment:    Roche ECLIA methodology. According to the American Urological Association, Serum  PSA should decrease and remain at undetectable levels after radical prostatectomy. The AUA defines biochemical recurrence as an initial PSA value 0.2 ng/mL or greater followed by a subsequent confirmatory PSA value 0.2 ng/mL or greater. Values obtained with different assay methods or kits cannot be used interchangeably. Results cannot be interpreted as absolute evidence of the presence or absence of malignant disease.          Passed - Last BP in normal range    BP Readings from Last 1 Encounters:  08/05/22 133/88         Passed - Valid encounter within last 12 months    Recent Outpatient Visits           3 months ago Subacute cough   [place] [facility], [facility]   9 months ago Acute renal failure, unspecified acute renal failure type [facility])   [place] [facility] Malva Limes, MD   1 year ago Primary hypertension   [place] [facility] Malva Limes, MD   1 year ago Type 2 diabetes mellitus with diabetic neuropathy, with long-term current use of insulin (HCC)   [place] [facility] Malva Limes, MD   2 years ago Annual physical exam   [facility], [facility]

## 2022-11-20 NOTE — Telephone Encounter (Signed)
Requested medication (s) are due for refill today:yes  Requested medication (s) are on the active medication list: yes Last refill:  07/29/22   Future visit scheduled: no Notes to clinic:  called pt and LM to call back to make appt- pt was to f/u   Requested Prescriptions  Pending Prescriptions Disp Refills   metFORMIN (GLUCOPHAGE) 1000 MG tablet [Pharmacy Med Name: metformin 1,000 mg tablet] 180 tablet     Sig: TAKE ONE TABLET BY MOUTH TWICE DAILY     Endocrinology:  Diabetes - Biguanides Failed - 11/19/2022  4:41 PM      Failed - HBA1C is between 0 and 7.9 and within 180 days    Hemoglobin A1C  Date Value Ref Range Status  08/05/2022 13.4 (A) 4.0 - 5.6 % Final   Hgb A1c MFr Bld  Date Value Ref Range Status  01/14/2022 9.9 (H) 4.8 - 5.6 % Final    Comment:    (NOTE) Pre diabetes:          5.7%-6.4%  Diabetes:              >6.4%  Glycemic control for   <7.0% adults with diabetes          Failed - B12 Level in normal range and within 720 days    No results found for: "VITAMINB12"       Passed - Cr in normal range and within 360 days    Creatinine, Ser  Date Value Ref Range Status  02/17/2022 1.07 0.76 - 1.27 mg/dL Final   Creatinine, POC  Date Value Ref Range Status  03/31/2017 n/a mg/dL Final   Creatinine, Urine  Date Value Ref Range Status  01/14/2022 209 mg/dL Final         Passed - eGFR in normal range and within 360 days    GFR calc Af Amer  Date Value Ref Range Status  03/25/2020 71 >59 mL/min/1.73 Final    Comment:    **In accordance with recommendations from the NKF-ASN Task force,**   [facility] is in the process of updating its eGFR calculation to the   2021 CKD-EPI creatinine equation that estimates kidney function   without a race variable.    GFR, Estimated  Date Value Ref Range Status  01/26/2022 >60 >60 mL/min Final    Comment:    (NOTE) Calculated using the CKD-EPI Creatinine Equation (2021)    eGFR  Date Value Ref Range Status   02/17/2022 77 >59 mL/min/1.73 Final         Passed - Valid encounter within last 6 months    Recent Outpatient Visits           3 months ago Subacute cough   [facility], [facility]   9 months ago Acute renal failure, unspecified acute renal failure type [facility])   [place] [facility] Malva Limes, MD   1 year ago Primary hypertension   [place] [facility] Malva Limes, MD   1 year ago Type 2 diabetes mellitus with diabetic neuropathy, with long-term current use of insulin [facility])   [place] [facility] Malva Limes, MD   2 years ago Annual physical exam   [place] [facility] Mal[facility] L[facility], MD              Passed - CBC within normal limits and completed in the last 12 months    WBC  Date  Value Ref Range Status  02/17/2022 8.4 3.4 - 10.8 x10E3/uL Final  01/23/2022 8.9 4.0 - 10.5 K/uL Final   RBC  Date Value Ref Range Status  02/17/2022 4.69 4.14 - 5.80 x10E6/uL Final  01/23/2022 4.52 4.22 - 5.81 MIL/uL Final   Hemoglobin  Date Value Ref Range Status  02/17/2022 12.7 (L) 13.0 - 17.7 g/dL Final   Hematocrit  Date Value Ref Range Status  02/17/2022 40.9 37.5 - 51.0 % Final   MCHC  Date Value Ref Range Status  02/17/2022 31.1 (L) 31.5 - 35.7 g/dL Final  16/03/9603 54.0 30.0 - 36.0 g/dL Final   [facility]  Date Value Ref Range Status  02/17/2022 27.1 26.6 - 33.0 pg Final  01/23/2022 28.5 26.0 - 34.0 pg Final   MCV  Date Value Ref Range Status  02/17/2022 87 79 - 97 fL Final   No results found for: "PLTCOUNTKUC", "LABPLAT", "POCPLA" RDW  Date Value Ref Range Status  02/17/2022 15.0 11.6 - 15.4 % Final         Refused Prescriptions Disp Refills   tamsulosin (FLOMAX) 0.4 MG CAPS capsule [Pharmacy Med Name: tamsulosin 0.4 mg capsule] 30 capsule     Sig: TAKE ONE CAPSULE BY MOUTH EVERY DAY     Urology: Alpha-Adrenergic Blocker Failed - 11/19/2022   4:41 PM      Failed - PSA in normal range and within 360 days    Prostate Specific Ag, Serum  Date Value Ref Range Status  10/16/2020 0.3 0.0 - 4.0 ng/mL Final    Comment:    Roche ECLIA methodology. According to the American Urological Association, Serum PSA should decrease and remain at undetectable levels after radical prostatectomy. The AUA defines biochemical recurrence as an initial PSA value 0.2 ng/mL or greater followed by a subsequent confirmatory PSA value 0.2 ng/mL or greater. Values obtained with different assay methods or kits cannot be used interchangeably. Results cannot be interpreted as absolute evidence of the presence or absence of malignant disease.          Passed - Last BP in normal range    BP Readings from Last 1 Encounters:  08/05/22 133/88         Passed - Valid encounter within last 12 months    Recent Outpatient Visits           3 months ago Subacute cough   [place] [facility] Malva Limes, MD   9 months ago Acute renal failure, unspecified acute renal failure type [facility])   [facility] Mal[facility] L[facility], [facility]   1 year ago Primary hypertension   [place] [facility] Malva Limes, MD   1 year ago Type 2 diabetes mellitus with diabetic neuropathy, with long-term current use of insulin (HCC)   [place] Mal[facility] L[facility], [facility]   2 years ago Annual physical exam   [facility] Malva Limes, MD

## 2022-11-24 ENCOUNTER — Other Ambulatory Visit: Payer: Self-pay | Admitting: Family Medicine

## 2022-12-08 ENCOUNTER — Other Ambulatory Visit: Payer: Self-pay | Admitting: Family Medicine

## 2022-12-08 ENCOUNTER — Telehealth: Payer: Self-pay | Admitting: Family Medicine

## 2022-12-08 DIAGNOSIS — E1140 Type 2 diabetes mellitus with diabetic neuropathy, unspecified: Secondary | ICD-10-CM

## 2022-12-08 NOTE — Telephone Encounter (Signed)
[employer] is requesting prior authorization Key: [id number] Name: [place] Dapagliflozin Propanediol 10 mg tablets  have been rejected and requires PA

## 2022-12-14 ENCOUNTER — Other Ambulatory Visit: Payer: Self-pay | Admitting: Family Medicine

## 2022-12-14 DIAGNOSIS — E1140 Type 2 diabetes mellitus with diabetic neuropathy, unspecified: Secondary | ICD-10-CM

## 2022-12-16 ENCOUNTER — Ambulatory Visit: Payer: Self-pay | Admitting: *Deleted

## 2022-12-16 NOTE — Telephone Encounter (Signed)
Message from [place] sent at 12/16/2022 11:08 AM EDT  Summary: med management.   Pt stated he was previously taking medication Farxiga, however, went to pick up his medication, and they had empagliflozin (JARDIANCE) 10 MG TABS tablet.  Pt stated he has not seen PCP and was not aware of this change.  Seeking clinical advice.          Call History   Type Contact Phone/Fax User  12/16/2022 11:08 AM EDT Phone (Incoming) [name], Taylor [name] "[place]" (Self) [phone] (H) McGill, Alondra   Reason for Disposition  [1] Caller has URGENT medicine question about med that PCP or specialist prescribed AND [2] triager unable to answer question  Answer Assessment - Initial Assessment Questions 1. NAME of MEDICINE: "What medicine(s) are you calling about?"     Pt has been switched from [place] to [place]. 2. QUESTION: "What is your question?" (e.g., double dose of medicine, side effect)     When did this happen?   "I haven't been in to see Dr. Sherrie Mustache".   I went to the pharmacy and they gave me Jardiance 10 mg tablets instead of the [place] I usually take. 3. PRESCRIBER: "Who prescribed the medicine?" Reason: if prescribed by specialist, call should be referred to that group.     Dr. Sherrie Mustache 4. SYMPTOMS: "Do you have any symptoms?" If Yes, ask: "What symptoms are you having?"  "How bad are the symptoms (e.g., mild, moderate, severe)     N/A 5. PREGNANCY:  "Is there any chance that you are pregnant?" "When was your last menstrual period?"     N/A  Protocols used: Medication Question Call-A-AH

## 2022-12-16 NOTE — Telephone Encounter (Signed)
Insurance denied Taylor [name] is preferred... they are interchangable so he should take Jardiance if his insurance covered it.

## 2022-12-16 NOTE — Telephone Encounter (Signed)
  Chief Complaint: See pt's message.   He takes [place] but when he went to the pharmacy they gave him Jardiance 10 mg instead.   Symptoms: No symptoms but has not been in to see Dr. Sherrie Mustache for any changes to be made. Frequency: N/A Pertinent Negatives: Patient denies N/A Disposition: [] ED /[] Urgent Care (no appt availability in office) / [] Appointment(In office/virtual)/ []  [place] [facility]/ [] Home Care/ [] Refused Recommended Disposition /[] [facility]/ [x]  Follow-up with PCP Additional Notes: Why has the [place] been changed to [facility] and when did this occur and why?  Message sent to Dr. Sherrie Mustache.

## 2022-12-16 NOTE — Telephone Encounter (Signed)
Patient advised as below.  

## 2022-12-21 NOTE — Telephone Encounter (Signed)
PA not required.

## 2023-01-07 ENCOUNTER — Other Ambulatory Visit: Payer: Self-pay | Admitting: Family Medicine

## 2023-01-07 DIAGNOSIS — E1140 Type 2 diabetes mellitus with diabetic neuropathy, unspecified: Secondary | ICD-10-CM

## 2023-01-07 NOTE — Telephone Encounter (Signed)
Medication Refill - Medication: Jardiance   Has the patient contacted their pharmacy? Yes.   (Agent: If no, request that the patient contact the pharmacy for the refill. If patient does not wish to contact the pharmacy document the reason why and proceed with request.) (Agent: If yes, when and what did the pharmacy advise?)  Preferred Pharmacy (with phone number or street name): [facility] Has the patient been seen for an appointment in the last year OR does the patient have an upcoming appointment? Yes.    Agent: Please be advised that RX refills may take up to 3 business days. We ask that you follow-up with your pharmacy.

## 2023-01-08 MED ORDER — EMPAGLIFLOZIN 10 MG PO TABS
10.0000 mg | ORAL_TABLET | Freq: Every day | ORAL | 0 refills | Status: DC
Start: 2023-01-08 — End: 2023-01-15

## 2023-01-08 NOTE — Telephone Encounter (Signed)
Requested Prescriptions  Pending Prescriptions Disp Refills   empagliflozin (JARDIANCE) 10 MG TABS tablet 90 tablet 0    Sig: Take 1 tablet (10 mg total) by mouth daily.     Endocrinology:  Diabetes - SGLT2 Inhibitors Failed - 01/07/2023  1:18 PM      Failed - HBA1C is between 0 and 7.9 and within 180 days    Hemoglobin A1C  Date Value Ref Range Status  08/05/2022 13.4 (A) 4.0 - 5.6 % Final   Hgb A1c MFr Bld  Date Value Ref Range Status  01/14/2022 9.9 (H) 4.8 - 5.6 % Final    Comment:    (NOTE) Pre diabetes:          5.7%-6.4%  Diabetes:              >6.4%  Glycemic control for   <7.0% adults with diabetes          Passed - Cr in normal range and within 360 days    Creatinine, Ser  Date Value Ref Range Status  02/17/2022 1.07 0.76 - 1.27 mg/dL Final   Creatinine, POC  Date Value Ref Range Status  03/31/2017 n/a mg/dL Final   Creatinine, Urine  Date Value Ref Range Status  01/14/2022 209 mg/dL Final         Passed - eGFR in normal range and within 360 days    GFR calc Af Amer  Date Value Ref Range Status  03/25/2020 71 >59 mL/min/1.73 Final    Comment:    **In accordance with recommendations from the NKF-ASN Task force,**   [facility] is in the process of updating its eGFR calculation to the   2021 CKD-EPI creatinine equation that estimates kidney function   without a race variable.    GFR, Estimated  Date Value Ref Range Status  01/26/2022 >60 >60 mL/min Final    Comment:    (NOTE) Calculated using the CKD-EPI Creatinine Equation (2021)    eGFR  Date Value Ref Range Status  02/17/2022 77 >59 mL/min/1.73 Final         Passed - Valid encounter within last 6 months    Recent Outpatient Visits           5 months ago Subacute cough   [place] [facility] Malva Limes, MD   10 months ago Acute renal failure, unspecified acute renal failure type [facility])   [place] [facility] Malva Limes, MD   1 year ago Primary  hypertension   [place] [facility] Malva Limes, MD   1 year ago Type 2 diabetes mellitus with diabetic neuropathy, with long-term current use of insulin (HCC)   [place] [facility] Malva Limes, MD   2 years ago Annual physical exam   [facility] Malva Limes, MD       Future Appointments             In 1 week Fisher, Demetrios Isaacs, MD [facility]

## 2023-01-12 ENCOUNTER — Other Ambulatory Visit: Payer: Self-pay | Admitting: Family Medicine

## 2023-01-13 NOTE — Telephone Encounter (Signed)
Requested Prescriptions  Pending Prescriptions Disp Refills   simvastatin (ZOCOR) 40 MG tablet [Pharmacy Med Name: simvastatin 40 mg tablet] 90 tablet 0    Sig: TAKE ONE TABLET BY MOUTH ONCE DAILY     Cardiovascular:  Antilipid - Statins Failed - 01/12/2023  3:07 PM      Failed - Lipid Panel in normal range within the last 12 months    Cholesterol, Total  Date Value Ref Range Status  10/16/2020 177 100 - 199 mg/dL Final   LDL Chol Calc (NIH)  Date Value Ref Range Status  10/16/2020 102 (H) 0 - 99 mg/dL Final   HDL  Date Value Ref Range Status  10/16/2020 46 >39 mg/dL Final   Triglycerides  Date Value Ref Range Status  01/16/2022 117 <150 mg/dL Final    Comment:    Performed at [facility], [address], [place], [place] [address]         Passed - Patient is not pregnant      Passed - Valid encounter within last 12 months    Recent Outpatient Visits           5 months ago Subacute cough   [place] [facility] Malva Limes, MD   11 months ago Acute renal failure, unspecified acute renal failure type [facility])   [place] [facility] Malva Limes, MD   1 year ago Primary hypertension   [place] [facility] Malva Limes, MD   1 year ago Type 2 diabetes mellitus with diabetic neuropathy, with long-term current use of insulin (HCC)   [place] [facility] Malva Limes, MD   2 years ago Annual physical exam   [facility] Malva Limes, MD       Future Appointments             In 2 days Fisher, Demetrios Isaacs, MD [facility]

## 2023-01-15 ENCOUNTER — Ambulatory Visit (INDEPENDENT_AMBULATORY_CARE_PROVIDER_SITE_OTHER): Payer: Medicare HMO | Admitting: Family Medicine

## 2023-01-15 ENCOUNTER — Encounter: Payer: Self-pay | Admitting: Family Medicine

## 2023-01-15 VITALS — BP 133/93 | HR 99 | Temp 97.6°F | Resp 20 | Ht 72.0 in | Wt 339.7 lb

## 2023-01-15 DIAGNOSIS — E7800 Pure hypercholesterolemia, unspecified: Secondary | ICD-10-CM

## 2023-01-15 DIAGNOSIS — E1140 Type 2 diabetes mellitus with diabetic neuropathy, unspecified: Secondary | ICD-10-CM

## 2023-01-15 DIAGNOSIS — Z125 Encounter for screening for malignant neoplasm of prostate: Secondary | ICD-10-CM | POA: Diagnosis not present

## 2023-01-15 DIAGNOSIS — I2510 Atherosclerotic heart disease of native coronary artery without angina pectoris: Secondary | ICD-10-CM | POA: Diagnosis not present

## 2023-01-15 DIAGNOSIS — I5020 Unspecified systolic (congestive) heart failure: Secondary | ICD-10-CM

## 2023-01-15 DIAGNOSIS — I10 Essential (primary) hypertension: Secondary | ICD-10-CM | POA: Diagnosis not present

## 2023-01-15 DIAGNOSIS — Z794 Long term (current) use of insulin: Secondary | ICD-10-CM

## 2023-01-15 LAB — POCT GLYCOSYLATED HEMOGLOBIN (HGB A1C): Hemoglobin A1C: 13.5 % — AB (ref 4.0–5.6)

## 2023-01-15 MED ORDER — TIRZEPATIDE 2.5 MG/0.5ML ~~LOC~~ SOAJ: 2.5000 mg | SUBCUTANEOUS | 0 refills | Status: DC

## 2023-01-15 MED ORDER — EMPAGLIFLOZIN 10 MG PO TABS
10.0000 mg | ORAL_TABLET | Freq: Every day | ORAL | 2 refills | Status: DC
Start: 2023-01-15 — End: 2023-03-22

## 2023-01-15 NOTE — Progress Notes (Signed)
Established patient visit   Patient: Taylor [name]   DOB: 01-21-1956   67 y.o. Male  MRN: [id number] Visit Date: 01/15/2023  Today's healthcare provider: Mila Merry, MD   Chief Complaint  Patient presents with   Diabetes   Subjective    HPI HPI   Patient reports FBS are 209-300s. He reports good compliance and tolerance with medications. Patient reports some burning on his feet. He has no had eye exam in the last 12 months.  Last edited by Myles Lipps, CMA on 01/15/2023  2:49 PM.      Not sure what medications he is taking. Appears to be out of [place], last dispensed 30 days of Farxigo in June.   Medications: Outpatient Medications Prior to Visit  Medication Sig   amiodarone (PACERONE) 200 MG tablet Take 1 tablet (200 mg total) by mouth 2 (two) times daily. (Patient not taking: Reported on 08/05/2022)   apixaban (ELIQUIS) 5 MG TABS tablet Take 1 tablet (5 mg total) by mouth 2 (two) times daily.   apixaban (ELIQUIS) 5 MG TABS tablet Take 1 tablet (5 mg total) by mouth 2 (two) times daily.   glipiZIDE (GLUCOTROL XL) 10 MG 24 hr tablet Take 1 tablet (10 mg total) by mouth 2 (two) times daily. One before breakfast, and one before supper   losartan (COZAAR) 25 MG tablet Take 1 tablet (25 mg total) by mouth daily.   metFORMIN (GLUCOPHAGE) 1000 MG tablet TAKE ONE TABLET BY MOUTH TWICE DAILY   metoprolol (TOPROL XL) 200 MG 24 hr tablet Take 1 tablet (200 mg total) by mouth daily.   NARCAN 4 MG/0.1ML LIQD nasal spray kit Place 0.4 mg into the nose as needed (accidental overdose).   OXYCONTIN 20 MG 12 hr tablet Take 1 tablet (20 mg total) by mouth every 12 (twelve) hours. (Patient taking differently: Take 20 mg by mouth 4 (four) times daily.)   potassium chloride SA (KLOR-CON M) 20 MEQ tablet Take 1 tablet (20 mEq total) by mouth daily.   pregabalin (LYRICA) 100 MG capsule Take 100 mg by mouth 3 (three) times daily.   simvastatin (ZOCOR) 40 MG tablet  TAKE ONE TABLET BY MOUTH ONCE DAILY   tamsulosin (FLOMAX) 0.4 MG CAPS capsule TAKE ONE CAPSULE BY MOUTH EVERY DAY   torsemide (DEMADEX) 20 MG tablet TAKE 2 TABLETS BY MOUTH IN THE MORNING AND 1 TABLET EVERY EVENING.   zolpidem (AMBIEN CR) 12.5 MG CR tablet Take 12.5 mg by mouth at bedtime.    [DISCONTINUED] empagliflozin (JARDIANCE) 10 MG TABS tablet Take 1 tablet (10 mg total) by mouth daily.   No facility-administered medications prior to visit.      Objective    BP (!) 133/93 (BP Location: Left Arm, Patient Position: Sitting, Cuff Size: Large)   Pulse 99   Temp 97.6 F (36.4 C) (Temporal)   Resp 20   Ht 6' (1.829 m)   Wt (!) 339 lb 11.2 oz (154.1 kg)   SpO2 96%   BMI 46.07 kg/m   Physical Exam   General: Appearance:    Obese male in no acute distress  Eyes:    PERRL, conjunctiva/corneas clear, EOM's intact       Lungs:     Clear to auscultation bilaterally, respirations unlabored  Heart:    Normal heart rate. Irregularly irregular rhythm. No murmurs, rubs, or gallops.    MS:   All extremities are intact.    Neurologic:  Awake, alert, oriented x 3. No apparent focal neurological defect.         Results for orders placed or performed in visit on 01/15/23  POCT glycosylated hemoglobin (Hb A1C)  Result Value Ref Range   Hemoglobin A1C 13.5 (A) 4.0 - 5.6 %    Assessment & Plan     1. Type 2 diabetes mellitus with diabetic neuropathy, with long-term current use of insulin (HCC) Appears to be out of SGLT1 agonist.  Refill sent for  empagliflozin (JARDIANCE) 10 MG TABS tablet; Take 1 tablet (10 mg total) by mouth daily.  Dispense: 90 tablet; Refill: 2  Start tirzepatide [facility]) 2.5 MG/0.5ML Pen; Inject 2.5 mg into the skin once a week.  Dispense: 2 mL; Refill: 0  2. Primary hypertension Fairly well controlled.   3. HFrEF (heart failure with reduced ejection fraction) (HCC) Continue follow up cardiology. Start back on SGLT-1 inhibitor.   4. Coronary artery disease  involving native coronary artery of native heart without angina pectoris Asymptomatic. Compliant with medication.  Continue aggressive risk factor modification.    5. Hypercholesteremia He is tolerating simvastatin well with no adverse effects.   - CBC - Comprehensive metabolic panel - Lipid panel - Direct LDL  6. Prostate cancer screening  - PSA   Return in about 8 weeks (around 03/12/2023).         Mila Merry, MD  [facility] [phone] (phone) [phone] (fax)  [facility]

## 2023-01-15 NOTE — Patient Instructions (Signed)
.   Please review the attached list of medications and notify my office if there are any errors.   . Please bring all of your medications to every appointment so we can make sure that our medication list is the same as yours.   

## 2023-01-19 ENCOUNTER — Telehealth: Payer: Self-pay

## 2023-01-19 ENCOUNTER — Other Ambulatory Visit: Payer: Self-pay

## 2023-01-19 DIAGNOSIS — E7800 Pure hypercholesterolemia, unspecified: Secondary | ICD-10-CM

## 2023-01-19 MED ORDER — ROSUVASTATIN CALCIUM 20 MG PO TABS
20.0000 mg | ORAL_TABLET | Freq: Every day | ORAL | 0 refills | Status: DC
Start: 2023-01-19 — End: 2023-04-30

## 2023-01-19 NOTE — Telephone Encounter (Signed)
Called patient back. No answer. Of patient calls back please advised him of labs results. Let him know his new prescription was sent in to his pharmacy on file.OK for [facility] Nurse to advise of labs results.

## 2023-01-19 NOTE — Telephone Encounter (Signed)
Copied from CRM [id number]. Topic: General - Other >> Jan 19, 2023  9:33 AM Epimenio Foot F wrote: Reason for CRM: Pt is calling in returning a call from the office. Please follow up with pt.

## 2023-01-19 NOTE — Telephone Encounter (Signed)
Copied from CRM [id number]. Topic: General - Other >> Jan 19, 2023  8:28 AM Taylor [name] wrote: Reason for CRM: The patient has been in contact with their pharmacy and told their tirzepatide [facility]) 2.5 MG/0.5ML Pen [[id number]] prescription would be $250 monthly   The patient would like to discuss cost effective alternatives when possible  Please contact further if/when available

## 2023-02-09 ENCOUNTER — Other Ambulatory Visit: Payer: Self-pay | Admitting: *Deleted

## 2023-02-09 DIAGNOSIS — I4821 Permanent atrial fibrillation: Secondary | ICD-10-CM

## 2023-02-09 MED ORDER — APIXABAN 5 MG PO TABS: 5.0000 mg | ORAL_TABLET | Freq: Two times a day (BID) | ORAL | 1 refills | Status: DC

## 2023-02-09 NOTE — Telephone Encounter (Signed)
Eliquis 5mg  refill request received. Patient is 67 years old, weight-154.1kg, Crea-0.91 on 01/15/23, Diagnosis-Afib, and last seen by Dr. Azucena Cecil on 07/20/22. Dose is appropriate based on dosing criteria. Will send in refill to requested pharmacy.

## 2023-02-15 ENCOUNTER — Other Ambulatory Visit: Payer: Self-pay | Admitting: Family Medicine

## 2023-03-22 ENCOUNTER — Ambulatory Visit: Payer: Medicare HMO | Admitting: Family Medicine

## 2023-03-22 ENCOUNTER — Encounter: Payer: Self-pay | Admitting: Family Medicine

## 2023-03-22 VITALS — BP 126/73 | HR 63 | Temp 98.4°F | Ht 72.0 in | Wt 333.5 lb

## 2023-03-22 DIAGNOSIS — E1140 Type 2 diabetes mellitus with diabetic neuropathy, unspecified: Secondary | ICD-10-CM

## 2023-03-22 DIAGNOSIS — Z794 Long term (current) use of insulin: Secondary | ICD-10-CM

## 2023-03-22 DIAGNOSIS — Z23 Encounter for immunization: Secondary | ICD-10-CM | POA: Diagnosis not present

## 2023-03-22 DIAGNOSIS — E7800 Pure hypercholesterolemia, unspecified: Secondary | ICD-10-CM | POA: Diagnosis not present

## 2023-03-22 MED ORDER — EMPAGLIFLOZIN 10 MG PO TABS
10.0000 mg | ORAL_TABLET | Freq: Every day | ORAL | Status: DC
Start: 2023-03-22 — End: 2023-09-23

## 2023-03-22 NOTE — Patient Instructions (Signed)
.   Please review the attached list of medications and notify my office if there are any errors.   . Please bring all of your medications to every appointment so we can make sure that our medication list is the same as yours.   

## 2023-03-22 NOTE — Progress Notes (Signed)
Established patient visit   Patient: Taylor [name]   DOB: [birth year]   67 y.o. Male  MRN: [id number] Visit Date: 03/22/2023  Today's healthcare provider: Mila Merry, MD   Chief Complaint  Patient presents with   Follow-up    2 month follow up for crestor   Subjective    HPI Presents for follow up lipids and diabetes since being prescribed rosuvastatin in August. He brings his medications in today and it seems he is still taking simvastatin in addition to the rosuvastatin that was prescribed. He denies any new sx or side effects.   He was also prescribed mounjaro at his last visit but never start due to expense and he really doesn't want a shot. He does not have Jardiance with him today so it seems he is not currently taking it.   Lab Results  Component Value Date   CHOL 150 01/15/2023   HDL 45 01/15/2023   LDLCALC 80 01/15/2023   LDLDIRECT 90 01/15/2023   TRIG 145 01/15/2023   CHOLHDL 3.3 01/15/2023   Lab Results  Component Value Date   NA 133 (L) 01/15/2023   K 3.9 01/15/2023   CREATININE 0.91 01/15/2023   EGFR 94 01/15/2023   GLUCOSE 295 (H) 01/15/2023   Lab Results  Component Value Date   HGBA1C 13.5 (A) 01/15/2023   HGBA1C 13.4 (A) 08/05/2022   HGBA1C 9.9 (H) 01/14/2022    Medications: Outpatient Medications Prior to Visit  Medication Sig   amiodarone (PACERONE) 200 MG tablet Take 1 tablet (200 mg total) by mouth 2 (two) times daily.   apixaban (ELIQUIS) 5 MG TABS tablet Take 1 tablet (5 mg total) by mouth 2 (two) times daily.   glipiZIDE (GLUCOTROL XL) 10 MG 24 hr tablet Take 1 tablet (10 mg total) by mouth 2 (two) times daily. One before breakfast, and one before supper   losartan (COZAAR) 25 MG tablet Take 1 tablet (25 mg total) by mouth daily.   metFORMIN (GLUCOPHAGE) 1000 MG tablet TAKE ONE TABLET BY MOUTH TWICE DAILY   OXYCONTIN 20 MG 12 hr tablet Take 1 tablet (20 mg total) by mouth every 12 (twelve) hours. (Patient taking differently:  Take 20 mg by mouth 4 (four) times daily.)   pregabalin (LYRICA) 100 MG capsule Take 100 mg by mouth 3 (three) times daily.   rosuvastatin (CRESTOR) 20 MG tablet Take 1 tablet (20 mg total) by mouth daily.   simvastatin (ZOCOR) 40 MG tablet TAKE ONE TABLET BY MOUTH ONCE DAILY   tamsulosin (FLOMAX) 0.4 MG CAPS capsule TAKE ONE CAPSULE BY MOUTH EVERY DAY   torsemide (DEMADEX) 20 MG tablet TAKE 2 TABLETS BY MOUTH IN THE MORNING AND 1 TABLET EVERY EVENING.   zolpidem (AMBIEN CR) 12.5 MG CR tablet Take 12.5 mg by mouth at bedtime.    empagliflozin (JARDIANCE) 10 MG TABS tablet Take 1 tablet (10 mg total) by mouth daily. (Patient not taking: Reported on 03/22/2023)   metoprolol (TOPROL XL) 200 MG 24 hr tablet Take 1 tablet (200 mg total) by mouth daily.   NARCAN 4 MG/0.1ML LIQD nasal spray kit Place 0.4 mg into the nose as needed (accidental overdose). (Patient not taking: Reported on 03/22/2023)   potassium chloride SA (KLOR-CON M) 20 MEQ tablet Take 1 tablet (20 mEq total) by mouth daily. (Patient not taking: Reported on 03/22/2023)   tirzepatide [facility]) 2.5 MG/0.5ML Pen Inject 2.5 mg into the skin once a week.   No facility-administered medications prior  to visit.    Review of Systems     Objective    BP 126/73   Pulse 63   Temp 98.4 F (36.9 C) (Oral)   Ht 6' (1.829 m)   Wt (!) 333 lb 8 oz (151.3 kg)   SpO2 91%   BMI 45.23 kg/m    Physical Exam  General appearance: Severely obese male, cooperative and in no acute distress Head: Normocephalic, without obvious abnormality, atraumatic Respiratory: Respirations even and unlabored, normal respiratory rate Extremities: All extremities are intact.  Skin: Skin color, texture, turgor normal. No rashes seen  Psych: Appropriate mood and affect. Neurologic: Mental status: Alert, oriented to person, place, and time, thought content appropriate.   Assessment & Plan     1. Hypercholesteremia Is tolerating addition of rosuvastatin be  appears to still taking simvastatin which he was supposed to stop. Advised to stop simvastatin and will check lipids at follow up in November.   2. Type 2 diabetes mellitus with diabetic neuropathy, with long-term current use of insulin (HCC) Never started Mounjaro due to expense and fear of needs. Is currently out of SGLT-1 inhibitors.   Given 6 weeks samples of empagliflozin (JARDIANCE) 10 MG TABS tablet; Take 1 tablet (10 mg total) by mouth daily.  Dispense: 42 tablet  Will check renal panel at follow up and consider going up to 25mg , will likely need pharmacy assistance due to cost of medication.   3. Need for influenza vaccination  - Flu Vaccine Trivalent High Dose (Fluad)          Mila Merry, MD  [facility] [phone] (phone) [phone] (fax)  [facility]

## 2023-04-26 ENCOUNTER — Ambulatory Visit: Payer: Medicare HMO | Admitting: Family Medicine

## 2023-04-30 ENCOUNTER — Other Ambulatory Visit: Payer: Self-pay | Admitting: Family Medicine

## 2023-04-30 DIAGNOSIS — E7800 Pure hypercholesterolemia, unspecified: Secondary | ICD-10-CM

## 2023-05-03 ENCOUNTER — Telehealth: Payer: Self-pay

## 2023-05-03 NOTE — Telephone Encounter (Signed)
Dr. Fredderick Severance concerned that patient is have some cognitive issues recently. Patient could not remember how to get to the office even though he has been a patient there for years. This has happened during has last few inperson visits. He had an appointment today at 10am and he was having a hard time counting his medication and being ready for his appointment. It took him over an hour to get his count done which is unusual for him. He just wanted to bring up his concerns Dr. Fredderick Severance [phone] personal cell phone for Dr. Sherrie Mustache if he has any follow-up questions or concerns.

## 2023-05-13 ENCOUNTER — Encounter: Payer: Self-pay | Admitting: Family Medicine

## 2023-05-14 ENCOUNTER — Encounter: Payer: Self-pay | Admitting: Family Medicine

## 2023-05-14 ENCOUNTER — Ambulatory Visit (INDEPENDENT_AMBULATORY_CARE_PROVIDER_SITE_OTHER): Payer: Medicare HMO | Admitting: Family Medicine

## 2023-05-14 VITALS — BP 129/78 | HR 118 | Ht 72.0 in | Wt 325.0 lb

## 2023-05-14 DIAGNOSIS — Z7984 Long term (current) use of oral hypoglycemic drugs: Secondary | ICD-10-CM | POA: Diagnosis not present

## 2023-05-14 DIAGNOSIS — E1140 Type 2 diabetes mellitus with diabetic neuropathy, unspecified: Secondary | ICD-10-CM | POA: Diagnosis not present

## 2023-05-14 DIAGNOSIS — I10 Essential (primary) hypertension: Secondary | ICD-10-CM

## 2023-05-14 DIAGNOSIS — G629 Polyneuropathy, unspecified: Secondary | ICD-10-CM | POA: Diagnosis not present

## 2023-05-14 DIAGNOSIS — E7800 Pure hypercholesterolemia, unspecified: Secondary | ICD-10-CM

## 2023-05-14 DIAGNOSIS — K760 Fatty (change of) liver, not elsewhere classified: Secondary | ICD-10-CM | POA: Diagnosis not present

## 2023-05-14 NOTE — Progress Notes (Signed)
Established patient visit   Patient: Taylor [name]   DOB: [birth year]   67 y.o. Male  MRN: [id number] Visit Date: 05/14/2023  Today's healthcare provider: Mila Merry, MD   Chief Complaint  Patient presents with   Diabetes   Hyperlipidemia   Hypertension   Subjective    Diabetes Pertinent negatives for diabetes include no chest pain.  Hyperlipidemia Pertinent negatives include no chest pain or shortness of breath.  Hypertension Pertinent negatives include no chest pain, palpitations or shortness of breath.   Presents for follow up hypetension, diabetes, lipids with no specific c/o. Changed atorvastatin to rosuvastatin in August. No side effects. Taking diabetes meds consistently.  Lab Results  Component Value Date   HGBA1C 13.5 (A) 01/15/2023   HGBA1C 13.4 (A) 08/05/2022   HGBA1C 9.9 (H) 01/14/2022   Lab Results  Component Value Date   CHOL 150 01/15/2023   HDL 45 01/15/2023   LDLCALC 80 01/15/2023   LDLDIRECT 90 01/15/2023   TRIG 145 01/15/2023   CHOLHDL 3.3 01/15/2023   Called from pain clinic a few weeks concerned about patient getting lost and seeming confused. Patient states he had some trouble finding clinic since he had been doing virtual visits previously, but has not had anyone else tell him that he gets confused or forgetful. Shows no signs of confusion today and is oriented x 3.  He does report having pain in burning in his feet.   Medications: Outpatient Medications Prior to Visit  Medication Sig   amiodarone (PACERONE) 200 MG tablet Take 1 tablet (200 mg total) by mouth 2 (two) times daily.   apixaban (ELIQUIS) 5 MG TABS tablet Take 1 tablet (5 mg total) by mouth 2 (two) times daily.   empagliflozin (JARDIANCE) 10 MG TABS tablet Take 1 tablet (10 mg total) by mouth daily.   glipiZIDE (GLUCOTROL XL) 10 MG 24 hr tablet Take 1 tablet (10 mg total) by mouth 2 (two) times daily. One before breakfast, and one before supper   losartan (COZAAR) 25 MG  tablet Take 1 tablet (25 mg total) by mouth daily.   metFORMIN (GLUCOPHAGE) 1000 MG tablet TAKE ONE TABLET BY MOUTH TWICE DAILY   metoprolol (TOPROL XL) 200 MG 24 hr tablet Take 1 tablet (200 mg total) by mouth daily.   NARCAN 4 MG/0.1ML LIQD nasal spray kit Place 0.4 mg into the nose as needed (accidental overdose).   OXYCONTIN 20 MG 12 hr tablet Take 1 tablet (20 mg total) by mouth every 12 (twelve) hours. (Patient taking differently: Take 20 mg by mouth 4 (four) times daily.)   pregabalin (LYRICA) 100 MG capsule Take 100 mg by mouth 3 (three) times daily.   rosuvastatin (CRESTOR) 20 MG tablet TAKE ONE TABLET BY MOUTH ONCE DAILY   torsemide (DEMADEX) 20 MG tablet TAKE 2 TABLETS BY MOUTH IN THE MORNING AND 1 TABLET EVERY EVENING.   zolpidem (AMBIEN CR) 12.5 MG CR tablet Take 12.5 mg by mouth at bedtime.    tamsulosin (FLOMAX) 0.4 MG CAPS capsule TAKE ONE CAPSULE BY MOUTH EVERY DAY (Patient not taking: Reported on 05/14/2023)   No facility-administered medications prior to visit.    Review of Systems  Constitutional:  Negative for appetite change, chills and fever.  Respiratory:  Negative for chest tightness, shortness of breath and wheezing.   Cardiovascular:  Negative for chest pain and palpitations.  Gastrointestinal:  Negative for abdominal pain, nausea and vomiting.       Objective  BP 129/78 (BP Location: Right Arm, Patient Position: Sitting, Cuff Size: Large)   Pulse (!) 118   Ht 6' (1.829 m)   Wt (!) 325 lb (147.4 kg)   SpO2 97%   BMI 44.08 kg/m    Physical Exam   General: Appearance:    Severely obese male in no acute distress  Eyes:    PERRL, conjunctiva/corneas clear, EOM's intact       Lungs:     Clear to auscultation bilaterally, respirations unlabored  Heart:    Tachycardic. Irregularly irregular rhythm. No murmurs, rubs, or gallops.    MS:   All extremities are intact.    Neurologic:   Awake, alert, oriented x 3. No apparent focal neurological defect.          Assessment & Plan     1. Type 2 diabetes mellitus with diabetic neuropathy, unspecified whether long term insulin use (HCC) Taking medications consistently - Hemoglobin A1c  Is open to trial of GLP-1 agonist despite fear of needles.   2. Primary hypertension Well controlled.  Continue current medications.    3. Hypercholesteremia Tolerating change to rosuvastatin.   - Comprehensive metabolic panel - Lipid panel  4. Neuropathy  - Vitamin B12  5. Fatty liver Checking transaminases today         Mila Merry, MD  [facility] [phone] (phone) [phone] (fax)  [facility]

## 2023-05-17 LAB — COMPREHENSIVE METABOLIC PANEL
ALT: 12 [IU]/L (ref 0–44)
AST: 19 [IU]/L (ref 0–40)
Albumin: 4 g/dL (ref 3.9–4.9)
Alkaline Phosphatase: 98 [IU]/L (ref 44–121)
BUN/Creatinine Ratio: 18 (ref 10–24)
BUN: 21 mg/dL (ref 8–27)
Bilirubin Total: 0.8 mg/dL (ref 0.0–1.2)
CO2: 24 mmol/L (ref 20–29)
Calcium: 9.3 mg/dL (ref 8.6–10.2)
Chloride: 91 mmol/L — ABNORMAL LOW (ref 96–106)
Creatinine, Ser: 1.16 mg/dL (ref 0.76–1.27)
Globulin, Total: 3.1 g/dL (ref 1.5–4.5)
Glucose: 359 mg/dL — ABNORMAL HIGH (ref 70–99)
Potassium: 4.2 mmol/L (ref 3.5–5.2)
Sodium: 135 mmol/L (ref 134–144)
Total Protein: 7.1 g/dL (ref 6.0–8.5)
eGFR: 69 mL/min/{1.73_m2} (ref >59)

## 2023-05-17 LAB — HEMOGLOBIN A1C

## 2023-05-17 LAB — LIPID PANEL
Chol/HDL Ratio: 3.6 {ratio} (ref 0.0–5.0)
Cholesterol, Total: 129 mg/dL (ref 100–199)
HDL: 36 mg/dL — ABNORMAL LOW (ref >39)
LDL Chol Calc (NIH): 56 mg/dL (ref 0–99)
Triglycerides: 229 mg/dL — ABNORMAL HIGH (ref 0–149)
VLDL Cholesterol Cal: 37 mg/dL (ref 5–40)

## 2023-05-17 LAB — VITAMIN B12: Vitamin B-12: 388 pg/mL (ref 232–1245)

## 2023-05-18 ENCOUNTER — Telehealth: Payer: Self-pay | Admitting: Family Medicine

## 2023-05-18 NOTE — Telephone Encounter (Signed)
Pt called in about status Dr Sherrie Mustache changing his BS med from a pill form to injection. Please cb

## 2023-05-18 NOTE — Telephone Encounter (Signed)
Please see result note 

## 2023-06-10 ENCOUNTER — Telehealth: Payer: Self-pay | Admitting: Family Medicine

## 2023-06-10 NOTE — Telephone Encounter (Signed)
 Farxiga 10 mg and mounjaro 2.50mg  was picked up by sister Inetta Fermo today called patient to confirm that sister was picking it up and he approved the pick up. Told him to update DPR moving forward so we won't call every time VM

## 2023-06-11 ENCOUNTER — Telehealth: Payer: Self-pay

## 2023-06-11 NOTE — Telephone Encounter (Signed)
 Copied from CRM [id number]. Topic: General - Other >> Jun 10, 2023 11:57 AM [place] B wrote: Reason for CRM: pt called in, says his sister , tinawill come pick up his med

## 2023-06-11 NOTE — Telephone Encounter (Signed)
 Abe People spoke with Laban Emperor on 06/10/23 and he gave verbal ok for Taylor [name] to pick up his meds.  The message from 06/10/23 was just routed on 06/11/23.

## 2023-06-21 ENCOUNTER — Ambulatory Visit: Payer: Medicare HMO | Admitting: Family Medicine

## 2023-06-22 ENCOUNTER — Ambulatory Visit: Payer: Medicare HMO

## 2023-06-22 DIAGNOSIS — Z0000 Encounter for general adult medical examination without abnormal findings: Secondary | ICD-10-CM | POA: Diagnosis not present

## 2023-06-22 NOTE — Patient Instructions (Addendum)
 Taylor [name] , Thank you for taking time to come for your Medicare Wellness Visit. I appreciate your ongoing commitment to your health goals. Please review the following plan we discussed and let me know if I can assist you in the future.   Referrals/Orders/Follow-Ups/Clinician Recommendations: NONE  This is a list of the screening recommended for you and due dates:  Health Maintenance  Topic Date Due   COVID-19 Vaccine (1) Never done   Zoster (Shingles) Vaccine (1 of 2) Never done   Colon Cancer Screening  Never done   Pneumonia Vaccine (2 of 2 - PCV) 11/28/2014   Stool Blood Test  03/14/2021   Eye exam for diabetics  11/07/2021   DTaP/Tdap/Td vaccine (2 - Td or Tdap) 11/26/2022   Yearly kidney health urinalysis for diabetes  08/06/2023   Hemoglobin A1C  11/12/2023   Yearly kidney function blood test for diabetes  05/13/2024   Medicare Annual Wellness Visit  06/21/2024   Flu Shot  Completed   Hepatitis C Screening  Completed   HPV Vaccine  Aged Out    Advanced directives: (ACP Link)Information on Advanced Care Planning can be found at [place]  Secretary of State Advance Health Care Directives Advance Health Care Directives ([email or web address]   Next Medicare Annual Wellness Visit scheduled for next year: Yes   06/27/24 @ 9:30 AM IN PERSON

## 2023-06-22 NOTE — Progress Notes (Signed)
 Subjective:   Taylor [name] is a 68 y.o. male who presents for Medicare Annual/Subsequent preventive examination.  Visit Complete: Virtual I connected with  Taylor [name] on 06/22/23 by a audio enabled telemedicine application and verified that I am speaking with the correct person using two identifiers.  This patient declined Interactive audio and acupuncturist. Therefore the visit was completed with audio only.   Patient Location: Home  Provider Location: Office/Clinic  I discussed the limitations of evaluation and management by telemedicine. The patient expressed understanding and agreed to proceed.  Vital Signs: Because this visit was a virtual/telehealth visit, some criteria may be missing or patient reported. Any vitals not documented were not able to be obtained and vitals that have been documented are patient reported.  Cardiac Risk Factors include: advanced age (>63men, >99 women);diabetes mellitus;dyslipidemia;hypertension;male gender;obesity (BMI >30kg/m2);sedentary lifestyle     Objective:    Today's Vitals   06/22/23 1056  PainSc: 4    There is no height or weight on file to calculate BMI.     06/22/2023   11:04 AM 06/17/2022   10:57 AM 01/15/2022    2:30 PM 01/13/2022   10:44 PM 03/03/2021    9:50 AM 08/14/2020    7:53 AM 07/01/2020   10:45 AM  Advanced Directives  Does Patient Have a Medical Advance Directive? No No  No No No No  Would patient like information on creating a medical advance directive? No - Patient declined No - Patient declined No - Patient declined  Yes (MAU/Ambulatory/Procedural Areas - Information given)  No - Patient declined    Current Medications (verified) Outpatient Encounter Medications as of 06/22/2023  Medication Sig   amiodarone  (PACERONE ) 200 MG tablet Take 1 tablet (200 mg total) by mouth 2 (two) times daily.   apixaban  (ELIQUIS ) 5 MG TABS tablet Take 1 tablet (5 mg total) by mouth 2 (two) times daily.    empagliflozin  (JARDIANCE ) 10 MG TABS tablet Take 1 tablet (10 mg total) by mouth daily.   glipiZIDE  (GLUCOTROL  XL) 10 MG 24 hr tablet Take 1 tablet (10 mg total) by mouth 2 (two) times daily. One before breakfast, and one before supper   losartan  (COZAAR ) 25 MG tablet Take 1 tablet (25 mg total) by mouth daily.   metFORMIN  (GLUCOPHAGE ) 1000 MG tablet TAKE ONE TABLET BY MOUTH TWICE DAILY   metoprolol  (TOPROL  XL) 200 MG 24 hr tablet Take 1 tablet (200 mg total) by mouth daily.   NARCAN 4 MG/0.1ML LIQD nasal spray kit Place 0.4 mg into the nose as needed (accidental overdose).   OXYCONTIN  20 MG 12 hr tablet Take 1 tablet (20 mg total) by mouth every 12 (twelve) hours. (Patient taking differently: Take 20 mg by mouth 4 (four) times daily.)   pregabalin (LYRICA) 100 MG capsule Take 100 mg by mouth 3 (three) times daily.   rosuvastatin  (CRESTOR ) 20 MG tablet TAKE ONE TABLET BY MOUTH ONCE DAILY   tamsulosin  (FLOMAX ) 0.4 MG CAPS capsule TAKE ONE CAPSULE BY MOUTH EVERY DAY   torsemide  (DEMADEX ) 20 MG tablet TAKE 2 TABLETS BY MOUTH IN THE MORNING AND 1 TABLET EVERY EVENING.   zolpidem  (AMBIEN  CR) 12.5 MG CR tablet Take 12.5 mg by mouth at bedtime.    No facility-administered encounter medications on file as of 06/22/2023.    Allergies (verified) Other   History: Past Medical History:  Diagnosis Date   Acute encephalopathy    Acute respiratory failure with hypercapnia (HCC)    Arthritis  Broken leg 02/07/2019   CAD (coronary artery disease)    Depression    Diabetes mellitus without complication (HCC)    Hypertension    NICM (nonischemic cardiomyopathy) (HCC)    Shock (HCC)    Past Surgical History:  Procedure Laterality Date   BACK SURGERY     CARDIOVERSION N/A 08/14/2020   Procedure: CARDIOVERSION;  Surgeon: Darliss Rogue, MD;  Location: [facility] ORS;  Service: Cardiovascular;  Laterality: N/A;   CARDIOVERSION N/A 03/03/2021   Procedure: CARDIOVERSION (CATH LAB);  Surgeon: Cindie Ole DASEN, MD;  Location[facility] INVASIVE CV LAB;  Service: Cardiovascular;  Laterality: N/A;   LEFT HEART CATH AND CORONARY ANGIOGRAPHY Left 04/05/2020   Procedure: LEFT HEART CATH AND CORONARY ANGIOGRAPHY;  Surgeon: Darron Deatrice LABOR, MD;  Location: [facility] INVASIVE CV LAB;  Service: Cardiovascular;  Laterality: Left;   TEE WITHOUT CARDIOVERSION N/A 03/03/2021   Procedure: TRANSESOPHAGEAL ECHOCARDIOGRAM (TEE);  Surgeon: Cindie Ole DASEN, MD;  Location: [facility] INVASIVE CV LAB;  Service: Cardiovascular;  Laterality: N/A;   Family History  Problem Relation Age of Onset   Diabetes Mother    Diabetes Sister    Social History   Socioeconomic History   Marital status: Single    Spouse name: Not on file   Number of children: 1   Years of education: Not on file   Highest education level: Some college, no degree  Occupational History   Occupation: disability/retired  Tobacco Use   Smoking status: Never    Passive exposure: Past   Smokeless tobacco: Never  Vaping Use   Vaping status: Never Used  Substance and Sexual Activity   Alcohol use: No    Alcohol/week: 0.0 standard drinks of alcohol   Drug use: No   Sexual activity: Not on file  Other Topics Concern   Not on file  Social History Narrative   Lives with Mother Taylor [name].    Social Drivers of Corporate Investment Banker Strain: Low Risk  (06/22/2023)   Overall Financial Resource Strain (CARDIA)    Difficulty of Paying Living Expenses: Not hard at all  Food Insecurity: No Food Insecurity (06/22/2023)   Hunger Vital Sign    Worried About Running Out of Food in the Last Year: Never true    Ran Out of Food in the Last Year: Never true  Transportation Needs: No Transportation Needs (06/22/2023)   PRAPARE - Administrator, Civil Service (Medical): No    Lack of Transportation (Non-Medical): No  Physical Activity: Inactive (06/22/2023)   Exercise Vital Sign    Days of Exercise per Week: 0 days    Minutes of Exercise per Session: 0  min  Stress: No Stress Concern Present (06/22/2023)   [employer] of Occupational Health - Occupational Stress Questionnaire    Feeling of Stress : Only a little  Social Connections: Socially Isolated (06/22/2023)   Social Connection and Isolation Panel [NHANES]    Frequency of Communication with Friends and Family: More than three times a week    Frequency of Social Gatherings with Friends and Family: Once a week    Attends Religious Services: Never    Database Administrator or Organizations: No    Attends Engineer, Structural: Never    Marital Status: Divorced    Tobacco Counseling Counseling given: Not Answered   Clinical Intake:  Pre-visit preparation completed: Yes  Pain : 0-10 Pain Score: 4  Pain Type: Chronic pain Pain Location: Back Pain Orientation: Right, Lower Pain  Radiating Towards: legs Pain Descriptors / Indicators: Aching, Discomfort, Constant Pain Onset: More than a month ago Pain Frequency: Constant     BMI - recorded: 44.1 Nutritional Status: BMI > 30  Obese Nutritional Risks: None Diabetes: Yes CBG done?: No Did pt. bring in CBG monitor from home?: No  How often do you need to have someone help you when you read instructions, pamphlets, or other written materials from your doctor or pharmacy?: 1 - Never  Interpreter Needed?: No  Information entered by :: Taylor DAS, LPN   Activities of Daily Living    06/22/2023   11:06 AM 01/15/2023    2:55 PM  In your present state of health, do you have any difficulty performing the following activities:  Hearing? 0 1  Vision? 0 0  Difficulty concentrating or making decisions? 0 0  Walking or climbing stairs? 1 1  Dressing or bathing? 0 0  Doing errands, shopping? 0 0  Preparing Food and eating ? N   Using the Toilet? N   In the past six months, have you accidently leaked urine? N   Do you have problems with loss of bowel control? N   Managing your Medications? Y   Managing your  Finances? N   Housekeeping or managing your Housekeeping? Y     Patient Care Team: Gasper Nancyann BRAVO, MD as PCP - General (Family Medicine) Darliss Rogue, MD as PCP - Cardiology (Cardiology) Cindie Ole DASEN, MD as PCP - Electrophysiology (Cardiology) Lanis Dowdy, MD as Referring Physician (Anesthesiology) Hunt Comer HERO, Student-RN as Student Nurse (Student) Jaye Fallow, MD as Referring Physician (Ophthalmology) Shellia Oh, MD (Inactive) as Consulting Physician (Pulmonary Disease)  Indicate any recent Medical Services you may have received from other than [facility] providers in the past year (date may be approximate).     Assessment:   This is a routine wellness examination for Taylor [name].  Hearing/Vision screen Hearing Screening - Comments:: NO AIDS Vision Screening - Comments:: READERS- [facility]   Goals Addressed             This Visit's Progress    DIET - INCREASE WATER  INTAKE         Depression Screen    06/22/2023   11:01 AM 05/14/2023   10:20 AM 03/22/2023    9:52 AM 01/15/2023    2:55 PM 08/05/2022   11:36 AM 06/17/2022   10:54 AM 10/16/2020   10:11 AM  PHQ 2/9 Scores  PHQ - 2 Score 6 1 3 2 4 1 3   PHQ- 9 Score 9 2 8 6 13 2 10     Fall Risk    06/22/2023   11:05 AM 05/14/2023   10:20 AM 03/22/2023    9:52 AM 01/15/2023    2:55 PM 08/05/2022   11:36 AM  Fall Risk   Falls in the past year? 0 0 0 0 0  Number falls in past yr: 0 0 0 0 0  Injury with Fall? 0 0 0 0 0  Risk for fall due to : No Fall Risks   No Fall Risks No Fall Risks  Follow up Falls prevention discussed;Falls evaluation completed   Falls evaluation completed Falls evaluation completed    MEDICARE RISK AT HOME: Medicare Risk at Home Any stairs in or around the home?: Yes If so, are there any without handrails?: No Home free of loose throw rugs in walkways, pet beds, electrical cords, etc?: Yes Adequate lighting in your home to reduce risk of falls?:  Yes Life alert?: No Use  of a cane, walker or w/c?: No (USES WALKING STICK SOMETIMES) Grab bars in the bathroom?: Yes Shower chair or bench in shower?: Yes Elevated toilet seat or a handicapped toilet?: No  TIMED UP AND GO:  Was the test performed?  No    Cognitive Function:        06/22/2023   11:08 AM 06/17/2022   10:58 AM  6CIT Screen  What Year? 0 points 0 points  What month? 0 points 0 points  What time? 0 points 0 points  Count back from 20 0 points 0 points  Months in reverse 0 points 0 points  Repeat phrase 0 points 0 points  Total Score 0 points 0 points    Immunizations Immunization History  Administered Date(s) Administered   Fluad Trivalent(High Dose 65+) 03/22/2023   Influenza,inj,Quad PF,6+ Mos 03/13/2019, 05/13/2020, 02/17/2022   Pneumococcal Polysaccharide-23 11/27/2013   Tdap 11/25/2012    TDAP status: Due, Education has been provided regarding the importance of this vaccine. Advised may receive this vaccine at local pharmacy or Health Dept. Aware to provide a copy of the vaccination record if obtained from local pharmacy or Health Dept. Verbalized acceptance and understanding.  Flu Vaccine status: Up to date  Pneumococcal vaccine status: Declined,  Education has been provided regarding the importance of this vaccine but patient still declined. Advised may receive this vaccine at local pharmacy or Health Dept. Aware to provide a copy of the vaccination record if obtained from local pharmacy or Health Dept. Verbalized acceptance and understanding.   Covid-19 vaccine status: Declined, Education has been provided regarding the importance of this vaccine but patient still declined. Advised may receive this vaccine at local pharmacy or Health Dept.or vaccine clinic. Aware to provide a copy of the vaccination record if obtained from local pharmacy or Health Dept. Verbalized acceptance and understanding.  Qualifies for Shingles Vaccine? Yes   Zostavax completed No   Shingrix Completed?:  No.    Education has been provided regarding the importance of this vaccine. Patient has been advised to call insurance company to determine out of pocket expense if they have not yet received this vaccine. Advised may also receive vaccine at local pharmacy or Health Dept. Verbalized acceptance and understanding.  Screening Tests Health Maintenance  Topic Date Due   COVID-19 Vaccine (1) Never done   Zoster Vaccines- Shingrix (1 of 2) Never done   Colonoscopy  Never done   Pneumonia Vaccine 20+ Years old (2 of 2 - PCV) 11/28/2014   COLON CANCER SCREENING ANNUAL FOBT  03/14/2021   OPHTHALMOLOGY EXAM  11/07/2021   DTaP/Tdap/Td (2 - Td or Tdap) 11/26/2022   Diabetic kidney evaluation - Urine ACR  08/06/2023   HEMOGLOBIN A1C  11/12/2023   Diabetic kidney evaluation - eGFR measurement  05/13/2024   Medicare Annual Wellness (AWV)  06/21/2024   INFLUENZA VACCINE  Completed   Hepatitis C Screening  Completed   HPV VACCINES  Aged Out    Health Maintenance  Health Maintenance Due  Topic Date Due   COVID-19 Vaccine (1) Never done   Zoster Vaccines- Shingrix (1 of 2) Never done   Colonoscopy  Never done   Pneumonia Vaccine 7+ Years old (2 of 2 - PCV) 11/28/2014   COLON CANCER SCREENING ANNUAL FOBT  03/14/2021   OPHTHALMOLOGY EXAM  11/07/2021   DTaP/Tdap/Td (2 - Td or Tdap) 11/26/2022    Colorectal cancer screening: Type of screening: FOBT/FIT. Completed 03/14/20. Repeat every 1  years  Lung Cancer Screening: (Low Dose CT Chest recommended if Age 101-80 years, 20 pack-year currently smoking OR have quit w/in 15years.) does not qualify.    Additional Screening:  Hepatitis C Screening: does qualify; Completed 07/30/16  Vision Screening: Recommended annual ophthalmology exams for early detection of glaucoma and other disorders of the eye. Is the patient up to date with their annual eye exam?  Yes  Who is the provider or what is the name of the office in which the patient attends annual eye  exams? [facility] If pt is not established with a provider, would they like to be referred to a provider to establish care? No .   Dental Screening: Recommended annual dental exams for proper oral hygiene  Diabetic Foot Exam: Diabetic Foot Exam: Overdue, Pt has been advised about the importance in completing this exam. Pt is scheduled for diabetic foot exam on 00.  Community Resource Referral / Chronic Care Management: CRR required this visit?  No   CCM required this visit?  No     Plan:     I have personally reviewed and noted the following in the patient's chart:   Medical and social history Use of alcohol, tobacco or illicit drugs  Current medications and supplements including opioid prescriptions. Patient is currently taking opioid prescriptions. Information provided to patient regarding non-opioid alternatives. Patient advised to discuss non-opioid treatment plan with their provider. Functional ability and status Nutritional status Physical activity Advanced directives List of other physicians Hospitalizations, surgeries, and ER visits in previous 12 months Vitals Screenings to include cognitive, depression, and falls Referrals and appointments  In addition, I have reviewed and discussed with patient certain preventive protocols, quality metrics, and best practice recommendations. A written personalized care plan for preventive services as well as general preventive health recommendations were provided to patient.     Taylor GORMAN Das, LPN   [id number]   After Visit Summary: (MyChart) Due to this being a telephonic visit, the after visit summary with patients personalized plan was offered to patient via MyChart   Nurse Notes: NONE

## 2023-07-05 ENCOUNTER — Other Ambulatory Visit: Payer: Self-pay | Admitting: Family Medicine

## 2023-07-05 DIAGNOSIS — Z794 Long term (current) use of insulin: Secondary | ICD-10-CM

## 2023-07-05 NOTE — Telephone Encounter (Signed)
Requested Prescriptions  Pending Prescriptions Disp Refills   glipiZIDE (GLUCOTROL XL) 10 MG 24 hr tablet [Pharmacy Med Name: glipizide ER 10 mg tablet, extended release 24 hr] 90 tablet 0    Sig: TAKE ONE TABLET BY MOUTH EVERY DAY     Endocrinology:  Diabetes - Sulfonylureas Failed - 07/05/2023  4:19 PM      Failed - HBA1C is between 0 and 7.9 and within 180 days    Hgb A1c MFr Bld  Date Value Ref Range Status  05/14/2023 >15.5 (H) 4.8 - 5.6 % Final    Comment:             Prediabetes: 5.7 - 6.4          Diabetes: >6.4          Glycemic control for adults with diabetes: <7.0          Passed - Cr in normal range and within 360 days    Creatinine, Ser  Date Value Ref Range Status  05/14/2023 1.16 0.76 - 1.27 mg/dL Final   Creatinine, POC  Date Value Ref Range Status  03/31/2017 n/a mg/dL Final   Creatinine, Urine  Date Value Ref Range Status  01/14/2022 209 mg/dL Final         Passed - Valid encounter within last 6 months    Recent Outpatient Visits           1 month ago Primary hypertension   [place] [facility] Malva Limes, MD   3 months ago Hypercholesteremia   [place] [facility] Malva Limes, MD   5 months ago Type 2 diabetes mellitus with diabetic neuropathy, with long-term current use of insulin [facility])   [place] [facility] Malva Limes, MD   11 months ago Subacute cough   [place] [facility] Malva Limes, MD   1 year ago Acute renal failure, unspecified acute renal failure type [facility])   [place] [facility] Malva Limes, MD

## 2023-07-15 ENCOUNTER — Other Ambulatory Visit: Payer: Self-pay | Admitting: Family Medicine

## 2023-07-15 DIAGNOSIS — E1140 Type 2 diabetes mellitus with diabetic neuropathy, unspecified: Secondary | ICD-10-CM

## 2023-07-15 NOTE — Telephone Encounter (Signed)
 Medication discontinued by provider on 03/22/23, will refuse this request.  Requested Prescriptions  Pending Prescriptions Disp Refills   MOUNJARO  2.5 MG/0.5ML Pen [Pharmacy Med Name: Mounjaro  2.5 mg/0.5 mL subcutaneous pen injector] 2 mL 0    Sig: INJECT 2.5MG  INTO THE SKIN ONCE A WEEK     Off-Protocol Failed - 07/15/2023  1:10 PM      Failed - Medication not assigned to a protocol, review manually.      Passed - Valid encounter within last 12 months    Recent Outpatient Visits           2 months ago Primary hypertension   [place] [facility] Gasper Nancyann BRAVO, MD   3 months ago Hypercholesteremia   [place] [facility] Gasper Nancyann BRAVO, MD   6 months ago Type 2 diabetes mellitus with diabetic neuropathy, with long-term current use of insulin  [facility])   [place] [facility] Gasper Nancyann BRAVO, MD   11 months ago Subacute cough   [place] [facility] Gasper Nancyann BRAVO, MD   1 year ago Acute renal failure, unspecified acute renal failure type [facility])   [place] [facility] Gasper Nancyann BRAVO, MD

## 2023-07-19 ENCOUNTER — Telehealth: Payer: Self-pay | Admitting: Family Medicine

## 2023-07-19 NOTE — Telephone Encounter (Signed)
 Left message on VM for his next appt to be 07/30/23 at 11:00 a.m.

## 2023-07-19 NOTE — Telephone Encounter (Signed)
 Medication Refill -  Most Recent Primary Care Visit:  Provider: Pinky Bright  Department: [id number]  Visit Type: MEDICARE AWV, SEQUENTIAL  Date: 06/22/2023  Medication: tirzepatide  (MOUNJARO ) 2.5 MG/0.5ML Pen  Pt requesting refill, attempted via pharmacy but it was denied. Pt states he has been taking the mounjaro  and had his last dose last week.   dapagliflozin  propanediol (FARXIGA ) 10 MG TABS tablet  Has the patient contacted their pharmacy? Yes   Is this the correct pharmacy for this prescription? Yes This is the patient's preferred pharmacy:  [facility] - [place], [place] - [address] [place] [address] Phone: [phone] Fax: [phone]   Has the prescription been filled recently? No  Is the patient out of the medication? Yes  Has the patient been seen for an appointment in the last year OR does the patient have an upcoming appointment? Yes  Can we respond through MyChart? Yes  Agent: Please be advised that Rx refills may take up to 3 business days. We ask that you follow-up with your pharmacy.

## 2023-07-19 NOTE — Telephone Encounter (Signed)
 Patient called, left VM to return the call to the office to schedule OV with Dr. Shann Darnel. It was noted on the result note 05/18/23 to return 6 weeks after starting mounjaro , which samples were given. He will need a f/u visit scheduled for around 6 weeks from 05/18/23 visit.

## 2023-07-28 ENCOUNTER — Telehealth: Payer: Self-pay

## 2023-07-28 NOTE — Telephone Encounter (Signed)
 Patient was identified as falling into the [place] Measure - Diabetes.   Patient was: Appointment scheduled with primary care provider in the next 30 days.

## 2023-07-30 ENCOUNTER — Ambulatory Visit (INDEPENDENT_AMBULATORY_CARE_PROVIDER_SITE_OTHER): Payer: Medicare HMO | Admitting: Family Medicine

## 2023-07-30 VITALS — BP 100/69 | HR 91 | Temp 98.0°F | Resp 18 | Ht 72.0 in | Wt 329.0 lb

## 2023-07-30 DIAGNOSIS — I10 Essential (primary) hypertension: Secondary | ICD-10-CM | POA: Diagnosis not present

## 2023-07-30 DIAGNOSIS — I4819 Other persistent atrial fibrillation: Secondary | ICD-10-CM

## 2023-07-30 DIAGNOSIS — Z6841 Body Mass Index (BMI) 40.0 and over, adult: Secondary | ICD-10-CM | POA: Diagnosis not present

## 2023-07-30 DIAGNOSIS — E1140 Type 2 diabetes mellitus with diabetic neuropathy, unspecified: Secondary | ICD-10-CM | POA: Diagnosis not present

## 2023-07-30 DIAGNOSIS — Z7985 Long-term (current) use of injectable non-insulin antidiabetic drugs: Secondary | ICD-10-CM

## 2023-07-30 NOTE — Patient Instructions (Signed)
 Marland Kitchen  Please review the attached list of medications and notify my office if there are any errors.   . Please bring all of your medications to every appointment so we can make sure that our medication list is the same as yours.

## 2023-07-30 NOTE — Progress Notes (Signed)
 Established patient visit   Patient: Taylor [name]   DOB: [birth year]   68 y.o. Male  MRN: [id number] Visit Date: 07/30/2023  Today's healthcare provider: Mila Merry, MD   Chief Complaint  Patient presents with   Diabetes    Patient had last A1C in December and level was greater than 15.  He presents today for follow up after getting started on Mounjaro.  Patient states he has been getting glucose readings at home of 150-200. But admits he has not been checking it much.  Patients weight has gone up 4 pounds since last visit Please see med list. He reports not being on Amiodarone, Eliquis and Jardiance    Subjective    Diabetes Pertinent negatives for diabetes include no chest pain.   HPI     Diabetes    Additional comments: Patient had last A1C in December and level was greater than 15.  He presents today for follow up after getting started on Mounjaro.  Patient states he has been getting glucose readings at home of 150-200. But admits he has not been checking it much.  Patients weight has gone up 4 pounds since last visit Please see med list. He reports not being on Amiodarone, Eliquis and Jardiance       Last edited by Adline Peals, CMA on 07/30/2023 11:09 AM.      Follow up DM since starting mounjaro 2.5mg  in December. He is taking consistently and tolerating with no side effects. Home sugars not checked frequently but was in low 100s this morning.   Lab Results  Component Value Date   HGBA1C >15.5 (H) 05/14/2023   HGBA1C 13.5 (A) 01/15/2023   HGBA1C 13.4 (A) 08/05/2022   Lab Results  Component Value Date   NA 135 05/14/2023   CL 91 (L) 05/14/2023   K 4.2 05/14/2023   CO2 24 05/14/2023   BUN 21 05/14/2023   CREATININE 1.16 05/14/2023   EGFR 69 05/14/2023   CALCIUM 9.3 05/14/2023   PHOS 4.4 01/18/2022   ALBUMIN 4.0 05/14/2023   GLUCOSE 359 (H) 05/14/2023    Medications: Outpatient Medications Prior to Visit  Medication Sig   glipiZIDE  (GLUCOTROL XL) 10 MG 24 hr tablet TAKE ONE TABLET BY MOUTH EVERY DAY   losartan (COZAAR) 25 MG tablet Take 1 tablet (25 mg total) by mouth daily.   metFORMIN (GLUCOPHAGE) 1000 MG tablet TAKE ONE TABLET BY MOUTH TWICE DAILY   metoprolol (TOPROL XL) 200 MG 24 hr tablet Take 1 tablet (200 mg total) by mouth daily.   NARCAN 4 MG/0.1ML LIQD nasal spray kit Place 0.4 mg into the nose as needed (accidental overdose).   OXYCONTIN 20 MG 12 hr tablet Take 1 tablet (20 mg total) by mouth every 12 (twelve) hours. (Patient taking differently: Take 20 mg by mouth 4 (four) times daily.)   pregabalin (LYRICA) 100 MG capsule Take 100 mg by mouth 3 (three) times daily.   rosuvastatin (CRESTOR) 20 MG tablet TAKE ONE TABLET BY MOUTH ONCE DAILY   tamsulosin (FLOMAX) 0.4 MG CAPS capsule TAKE ONE CAPSULE BY MOUTH EVERY DAY   torsemide (DEMADEX) 20 MG tablet TAKE 2 TABLETS BY MOUTH IN THE MORNING AND 1 TABLET EVERY EVENING.   zolpidem (AMBIEN CR) 12.5 MG CR tablet Take 12.5 mg by mouth at bedtime.    amiodarone (PACERONE) 200 MG tablet Take 1 tablet (200 mg total) by mouth 2 (two) times daily. (Patient not taking: Reported on 07/30/2023)  apixaban (ELIQUIS) 5 MG TABS tablet Take 1 tablet (5 mg total) by mouth 2 (two) times daily. (Patient not taking: Reported on 07/30/2023)   empagliflozin (JARDIANCE) 10 MG TABS tablet Take 1 tablet (10 mg total) by mouth daily. (Patient not taking: Reported on 07/30/2023)   No facility-administered medications prior to visit.    Review of Systems  Constitutional:  Negative for appetite change, chills and fever.  Respiratory:  Negative for chest tightness, shortness of breath and wheezing.   Cardiovascular:  Negative for chest pain and palpitations.  Gastrointestinal:  Negative for abdominal pain, nausea and vomiting.      Objective    BP 100/69 (BP Location: Left Arm, Patient Position: Sitting, Cuff Size: Large)   Pulse 91   Temp 98 F (36.7 C) (Oral)   Resp 18   Ht 6' (1.829  m)   Wt (!) 329 lb (149.2 kg)   BMI 44.62 kg/m    Physical Exam   General: Appearance:    Severely obese male in no acute distress  Eyes:    PERRL, conjunctiva/corneas clear, EOM's intact       Lungs:     Clear to auscultation bilaterally, respirations unlabored  Heart:    Normal heart rate. Irregularly irregular rhythm. No murmurs, rubs, or gallops.    MS:   All extremities are intact.    Neurologic:   Awake, alert, oriented x 3. No apparent focal neurological defect.         Assessment & Plan     1. Type 2 diabetes mellitus with diabetic neuropathy, unspecified whether long term insulin use (HCC) (Primary) Tolerating initiation of Mounjaro well.  - Renal function panel - Hemoglobin A1c  Is currently out of SGLT-1 partially due to cost, also noted to be hypotensive. Anticipate increasing Mounjaro to 5mg . Consider restarting SGLT-1 if BP comes up at follow up. May need to work with pharmacist for patient assistance.   2. Morbid obesity (HCC) Anticipate weight loss with continue GLP-1  3. Primary hypertension Currently mildly hypotensive.   4. Persistent atrial fibrillation (HCC) Apparently lost to cardiology follow up. Currently out of Eliquis. Will see if he can some patient assistance         Mila Merry, MD  [facility] [phone] (phone) [phone] (fax)  [facility]

## 2023-08-02 LAB — HEMOGLOBIN A1C
Est. average glucose Bld gHb Est-mCnc: 381 mg/dL
Hgb A1c MFr Bld: 14.9 % — ABNORMAL HIGH (ref 4.8–5.6)

## 2023-08-02 LAB — RENAL FUNCTION PANEL
Albumin: 4 g/dL (ref 3.9–4.9)
BUN/Creatinine Ratio: 11 (ref 10–24)
BUN: 14 mg/dL (ref 8–27)
CO2: 21 mmol/L (ref 20–29)
Calcium: 9.5 mg/dL (ref 8.6–10.2)
Chloride: 89 mmol/L — ABNORMAL LOW (ref 96–106)
Creatinine, Ser: 1.25 mg/dL (ref 0.76–1.27)
Glucose: 416 mg/dL — ABNORMAL HIGH (ref 70–99)
Phosphorus: 3.7 mg/dL (ref 2.8–4.1)
Potassium: 4.1 mmol/L (ref 3.5–5.2)
Sodium: 133 mmol/L — ABNORMAL LOW (ref 134–144)
eGFR: 64 mL/min/{1.73_m2} (ref >59)

## 2023-08-05 ENCOUNTER — Other Ambulatory Visit: Payer: Self-pay | Admitting: Family Medicine

## 2023-08-05 ENCOUNTER — Other Ambulatory Visit: Payer: Self-pay

## 2023-08-05 DIAGNOSIS — Z79899 Other long term (current) drug therapy: Secondary | ICD-10-CM

## 2023-08-05 DIAGNOSIS — E1140 Type 2 diabetes mellitus with diabetic neuropathy, unspecified: Secondary | ICD-10-CM

## 2023-08-05 MED ORDER — TIRZEPATIDE 5 MG/0.5ML ~~LOC~~ SOAJ: 5.0000 mg | SUBCUTANEOUS | 0 refills | Status: DC

## 2023-08-09 ENCOUNTER — Telehealth: Payer: Self-pay

## 2023-08-09 NOTE — Progress Notes (Signed)
 Care Guide Pharmacy Note  08/09/2023 Name: Taylor [name] MRN: [id number] DOB: September 10, 1955  Referred By: Malva Limes, MD Reason for referral: Care Coordination (Outreach to schedule with Pharm d )   [name] is a 68 y.o. year old male who is a primary care patient of Malva Limes, MD.  [name] was referred to the pharmacist for assistance related to: DMII  Successful contact was made with the patient to discuss pharmacy services including being ready for the pharmacist to call at least 5 minutes before the scheduled appointment time and to have medication bottles and any blood pressure readings ready for review. The patient agreed to meet with the pharmacist via telephone visit on (date/time).08/19/2023  Penne Lash , RMA     [place]  [facility], [facility] Guide  Direct Dial: [phone]  Website: [email or web address]

## 2023-08-19 ENCOUNTER — Other Ambulatory Visit: Payer: Self-pay | Admitting: Pharmacist

## 2023-08-19 NOTE — Progress Notes (Signed)
   08/19/2023  Patient ID: Taylor [name], male   DOB: [birth year], 68 y.o.   MRN: [id number]  Called and spoke with the patient on the phone today regarding medication costs.   Reports difficulty affording Eliquis, Jardiance, and Mounjaro. History of difficulty controlling Afib last year according to notes and was started on Amiodarone and Eliquis.   For PAP, patient can start Ozempic to replace Mounjaro. Then replace Jardiance for Farxiga. Will submit applications electronically. For Eliquis, there is a PAP form but patient would have to pay $297 for copay due to $250 drug deductible with [facility] plan. Would then be $47 monthly afterwards.  Will send information to Dr. Sherrie Mustache regarding concerns as there is no affordable option to get a DOAC medication. Warfarin is next option, but usually managed by Cardiology.     Ricka Burdock, PharmD [facility] Phone Number: [phone]

## 2023-08-24 ENCOUNTER — Telehealth: Payer: Self-pay | Admitting: Pharmacist

## 2023-08-24 DIAGNOSIS — E1140 Type 2 diabetes mellitus with diabetic neuropathy, unspecified: Secondary | ICD-10-CM

## 2023-08-24 NOTE — Progress Notes (Addendum)
   08/24/2023  Patient ID: [name], male   DOB: Apr 25, 1956, 68 y.o.   MRN: [id number]  [facility] after receiving a statement saying part of the application was missing. Upon second review of application, it was approved during the call today. Reports it is set to ship tomorrow on 08/25/23 to the provider's office.  Appears patient was also approved for Marcelline Deist, which should be shipping to his home soon. Will plan to call the patient once orders arrive to review how the medications work, and what they are replacing.   Tracking number says it will arrive by March 31st at [facility].  UPS tracking number: [id number]  [employer] regarding shipment. Takes 10-14 business days, but giving voucher now.  Ozempic 0.25mg  30DS voucher: ID: [id number] BIN: [id number] Grp: [id number] PCN: [id number]  Called pharmacy and they accept vouchers. Ozempic voucher provided and Rx sent. Will verify if patient received Rx from the pharmacy on Monday, then call Tuesday.     Ricka Burdock, PharmD [facility]  Phone Number: [phone]

## 2023-08-30 ENCOUNTER — Telehealth: Payer: Self-pay

## 2023-08-30 NOTE — Telephone Encounter (Signed)
 Patient was identified as falling into the [place] Measure - Diabetes.   Patient was: Referred to pharmacy for chronic disease management.

## 2023-09-02 MED ORDER — OZEMPIC (0.25 OR 0.5 MG/DOSE) 2 MG/3ML ~~LOC~~ SOPN: PEN_INJECTOR | SUBCUTANEOUS | 0 refills | Status: DC

## 2023-09-02 NOTE — Addendum Note (Signed)
 Addended by: Linna Darner on: 09/02/2023 12:04 PM   Modules accepted: Orders

## 2023-09-07 ENCOUNTER — Telehealth: Payer: Self-pay | Admitting: Pharmacist

## 2023-09-07 NOTE — Progress Notes (Signed)
   09/07/2023  Patient ID: [name], male   DOB: 16-Jan-1956, 68 y.o.   MRN: [id number]  Called patient and let him know that he was approved for [place] and Ozempic for free. The Marcelline Deist arrived in the mail today per patient. Advised to take 1 tablet each morning. The Ozempic was approved, but shipping is delayed.Therefore, voucher obtained. Advised Ozempic is ready at the pharmacy at no charge. Patient confirmed understanding and told that following shipments will arrive at the doctor's office. This will replace his weekly Mounjaro.  Discussed brief review of how to take Ozempic weekly and that he will have 1 pen in the box with 6 needles, versus the [facility] which had 4 pens in a box. Advised to give the office a call if he cannot figure out how to do the Ozempic 0.25mg  weekly.  Follow-up is scheduled for 09/23/23 currently. Will continue checking blood sugar readings until then.   Ricka Burdock, PharmD [facility]  Phone Number: [phone]

## 2023-09-10 ENCOUNTER — Other Ambulatory Visit: Payer: Self-pay | Admitting: Family Medicine

## 2023-09-13 NOTE — Telephone Encounter (Signed)
 Requested Prescriptions  Pending Prescriptions Disp Refills   metoprolol (TOPROL-XL) 200 MG 24 hr tablet [Pharmacy Med Name: metoprolol succinate ER 200 mg tablet,extended release 24 hr] 90 tablet 2    Sig: TAKE ONE TABLET BY MOUTH ONCE DAILY     Cardiovascular:  Beta Blockers Passed - 09/13/2023 10:28 AM      Passed - Last BP in normal range    BP Readings from Last 1 Encounters:  07/30/23 100/69         Passed - Last Heart Rate in normal range    Pulse Readings from Last 1 Encounters:  07/30/23 91         Passed - Valid encounter within last 6 months    Recent Outpatient Visits           1 month ago Type 2 diabetes mellitus with diabetic neuropathy, unspecified whether long term insulin use (HCC)   [place] [facility] Sherrie Mustache, Demetrios Isaacs, MD

## 2023-09-19 ENCOUNTER — Other Ambulatory Visit: Payer: Self-pay | Admitting: Family Medicine

## 2023-09-19 DIAGNOSIS — E1140 Type 2 diabetes mellitus with diabetic neuropathy, unspecified: Secondary | ICD-10-CM

## 2023-09-21 ENCOUNTER — Telehealth: Payer: Self-pay | Admitting: Family Medicine

## 2023-09-21 NOTE — Telephone Encounter (Signed)
 Unable to contact patient by phone.No VM set up.Taylor AasLVM with sister, to pick up Ozempic

## 2023-09-23 ENCOUNTER — Other Ambulatory Visit: Payer: Self-pay | Admitting: Pharmacist

## 2023-09-23 DIAGNOSIS — I4821 Permanent atrial fibrillation: Secondary | ICD-10-CM

## 2023-09-23 NOTE — Progress Notes (Addendum)
   09/23/2023  Patient ID: Taylor [name], male   DOB: 05/15/1956, 68 y.o.   MRN: [id number]  Called and spoke with the patient on the phone today. Reports no new concerns at this time.  Confirms he is getting Ozempic  0.25mg  weekly on Mondays as sister gives him the shot. Also unknown if Farxiga  arrived due to sister managing medications. Will need to call sister on Monday afternoon to confirm. Also need to get BG readings.   Has not met with Cardiology since February 2024 and has not been taking Eiquis since October 2024. Not on amiodarone  since May 2024 either.  Update from 09/27/23 call with sister: - Sister is managing all of his medications currently and doing his housework like cleaning/laundry - Requesting home health referral for assistance a few hours a week at minimum - She currently works for [employer] care - Checks his BG when she can (can't remember numbers currently) and does his once weekly shot for him- reviewed Ozempic  dosing on the phone; plans to get PAP order from office later this week - Reports his appetite is down (hopeful for weight loss), but has bad habit with [employer] drinks (causing BG to go up) - Of note, patient would not be able to give himself a daily insulin  shot - Confirms taking Farxiga  (received 09/06/23 in the mail)    Taylor [name], PharmD [facility]  Phone Number: [phone]

## 2023-09-29 MED ORDER — APIXABAN 5 MG PO TABS: 5.0000 mg | ORAL_TABLET | Freq: Two times a day (BID) | ORAL | 1 refills | Status: DC

## 2023-09-29 NOTE — Addendum Note (Signed)
 Addended by: Lamon Pillow on: 09/29/2023 08:06 AM   Modules accepted: Orders

## 2023-09-29 NOTE — Progress Notes (Addendum)
 Have sent refill for Eliquis  to [facility].  Patient needs office visit to document medical necessity for home health and follow up since starting Ozempic . Should schedule in about 4 weeks so we have a good idea how Ozempic  is working.

## 2023-10-04 ENCOUNTER — Telehealth: Payer: Self-pay | Admitting: Family Medicine

## 2023-10-04 NOTE — Telephone Encounter (Signed)
 Patient called to state his sister, Marea Sham, will be picking up his medication.

## 2023-10-05 ENCOUNTER — Telehealth: Payer: Self-pay

## 2023-10-05 NOTE — Telephone Encounter (Signed)
 Brian Campanile is not on the patient's DPR.  Patient did call yesterday and he is aware that the medication is here    Copied from CRM [id number]. Topic: Clinical - Prescription Issue >> Oct 05, 2023 12:56 PM Carlatta H wrote: Reason for CRM: Please call [phone]//this is Brian Campanile the patients sister she would like to know if medications are ready for pick up at the office

## 2023-10-07 ENCOUNTER — Other Ambulatory Visit: Payer: Self-pay | Admitting: Family Medicine

## 2023-10-19 ENCOUNTER — Telehealth: Payer: Self-pay | Admitting: Family Medicine

## 2023-10-19 NOTE — Telephone Encounter (Signed)
 The patient's sister wants to if there's a way for the provider to set up Homecare for Patient & what forms need to be filled out to get this started.  Also she states that patient needs his farxga and it hasn't arrived. She wants to know what the Provider can do to help with this matter. Patient is completely out.

## 2023-10-22 NOTE — Telephone Encounter (Signed)
 Requires in office appointment to evaluate and document home bound status and medical necessity form home care. We can address at his appointment next week.

## 2023-10-27 ENCOUNTER — Ambulatory Visit: Admitting: Family Medicine

## 2023-10-31 ENCOUNTER — Telehealth: Payer: Self-pay | Admitting: Family Medicine

## 2023-10-31 DIAGNOSIS — E7800 Pure hypercholesterolemia, unspecified: Secondary | ICD-10-CM

## 2023-11-01 NOTE — Telephone Encounter (Signed)
 Overdue for follow up. Need to schedule before refills can be approved.

## 2023-11-02 ENCOUNTER — Telehealth: Payer: Self-pay | Admitting: Pharmacist

## 2023-11-02 NOTE — Telephone Encounter (Signed)
 Spoke with pt and scheduled office visit

## 2023-11-02 NOTE — Progress Notes (Signed)
   11/02/2023  Patient ID: Taylor [name], male   DOB: 1955-08-26, 68 y.o.   MRN: [id number]  Received fax stating there was missing information on the refill reorder form.  Of note, I did NOT submit this form. Called and said it would just need to be renewed for Ozempic  dosage.  Will discuss on 6/4 the Ozempic  dosing and submit once determining max tolerability.   Delvin File, PharmD [facility]  Phone Number: [phone]

## 2023-11-10 ENCOUNTER — Other Ambulatory Visit: Payer: Self-pay | Admitting: Pharmacist

## 2023-11-10 NOTE — Progress Notes (Signed)
   11/10/2023  Patient ID: Taylor [name], male   DOB: [birth year], 68 y.o.   MRN: [id number]  Called and spoke with the patient on the phone today.   Reports he is tolerating Ozempic  well at this time. However he is having back pain lately since driving to see daughter graduate in [place]. Advised to address at upcoming visit on 6/27 if able to wait.  Since last call, appears patient has not received his Eliquis  (discussed during last call- too expensive) or Torsemide . No description of new swelling though.   Called sister afterwards as requested by patient since she handles the medications. Has been giving him Ozempic  0.5mg  weekly. Tried to explain the boxes and doses of Ozempic  over the phone, but she was driving during this timeframe. Message sent to her phone explaining medications more in-depth for better understanding. Told to call office if any further questions.  Advised to finish up the 0.5mg  weekly doses in the red boxes and then move onto the blue 1mg  box next. Each 1 pen/box= 4 doses (4 weeks).   Her concern is still the home assistance. Reminded to attend visit on 6/27 with Dr. Shann Darnel and discuss it further then. Need office visit in order to submit the paper work and he missed his last appointment.   Will call in 5 weeks to see how we are doing with the Ozempic  and decide on dosing of Novo refill order form.    Taylor [name], PharmD [facility]  Phone Number: [phone]

## 2023-11-18 ENCOUNTER — Telehealth: Payer: Self-pay

## 2023-11-18 NOTE — Telephone Encounter (Signed)
 Copied from CRM [id number]. Topic: General - Other >> Nov 17, 2023  4:39 PM Stanly Early wrote: Reason for CRM: MD Alita Apt has concerns about cognitive decline, please give a callback on his personal Cell [phone] texting is best to set up a time to talk with MD fisher.  [phone] number)

## 2023-11-29 ENCOUNTER — Ambulatory Visit: Admitting: Family Medicine

## 2023-12-03 ENCOUNTER — Encounter: Payer: Self-pay | Admitting: Family Medicine

## 2023-12-03 ENCOUNTER — Ambulatory Visit (INDEPENDENT_AMBULATORY_CARE_PROVIDER_SITE_OTHER): Admitting: Family Medicine

## 2023-12-03 VITALS — BP 138/82 | HR 53 | Resp 18 | Ht 72.0 in | Wt 310.0 lb

## 2023-12-03 DIAGNOSIS — G3184 Mild cognitive impairment, so stated: Secondary | ICD-10-CM | POA: Diagnosis not present

## 2023-12-03 DIAGNOSIS — I2510 Atherosclerotic heart disease of native coronary artery without angina pectoris: Secondary | ICD-10-CM | POA: Diagnosis not present

## 2023-12-03 DIAGNOSIS — I5020 Unspecified systolic (congestive) heart failure: Secondary | ICD-10-CM | POA: Diagnosis not present

## 2023-12-03 DIAGNOSIS — E1140 Type 2 diabetes mellitus with diabetic neuropathy, unspecified: Secondary | ICD-10-CM

## 2023-12-03 DIAGNOSIS — G4733 Obstructive sleep apnea (adult) (pediatric): Secondary | ICD-10-CM

## 2023-12-03 DIAGNOSIS — I4819 Other persistent atrial fibrillation: Secondary | ICD-10-CM

## 2023-12-03 DIAGNOSIS — H9190 Unspecified hearing loss, unspecified ear: Secondary | ICD-10-CM

## 2023-12-03 NOTE — Patient Instructions (Signed)
 Taylor [name]  Please review the attached list of medications and notify my office if there are any errors.   . Please bring all of your medications to every appointment so we can make sure that our medication list is the same as yours.

## 2023-12-03 NOTE — Progress Notes (Signed)
 Established patient visit   Patient: Taylor [name]   DOB: 1956/03/24   68 y.o. Male  MRN: [id number] Visit Date: 12/03/2023  Today's healthcare provider: Nancyann Perry, MD   Chief Complaint  Patient presents with   Medical Management of Chronic Issues   Subjective    Discussed the use of AI scribe software for clinical note transcription with the patient, who gave verbal consent to proceed.  History of Present Illness   Taylor [name] is a 68 year old male with diabetes who presents for a routine checkup and management of his blood sugar levels. He is accompanied by his sister, Ellouise.  He has been on Ozempic  0.5 mg weekly for about two to three months, which has helped curb his appetite. He also takes glipizide  and Farxiga  daily. Despite these medications, he continues to consume Coca Cola regularly, which he acknowledges as an addiction. He checks his blood sugar occasionally, particularly when he feels symptoms like headaches. He is getting Ozempic  through patient assistance and states has already been sent 1mg  pens which he has not yet started.   He experiences chronic back pain that significantly impacts his daily life, causing him to spend much of his time lying down and watching TV. This pain limits his physical activity, and he only gets up for necessary appointments. The pain is constant and affects his ability to engage in daily activities.  He has sleep disturbances, characterized by being awake at night despite taking Ambien . He attributes this to a lack of daytime activity, as he used to have a more structured routine when he was working. He often naps during the day, which may contribute to his nighttime wakefulness. He has not tried other sleep aids like trazodone .  He has a history of kidney disease and is concerned about his kidney function. He mentions a past attempt to use a CPAP machine, which was unsuccessful due to discomfort.  He has lost weight,  going from 329 pounds in December to 310 pounds currently, which he attributes to his mother's cooking. He wants in-home care to assist with daily tasks.     Lab Results  Component Value Date   NA 133 (L) 07/30/2023   K 4.1 07/30/2023   CREATININE 1.25 07/30/2023   EGFR 64 07/30/2023   GLUCOSE 416 (H) 07/30/2023     Medications: Outpatient Medications Prior to Visit  Medication Sig   dapagliflozin  propanediol (FARXIGA ) 10 MG TABS tablet Take 10 mg by mouth daily.   glipiZIDE  (GLUCOTROL  XL) 10 MG 24 hr tablet TAKE ONE TABLET BY MOUTH EVERY DAY   losartan  (COZAAR ) 25 MG tablet Take 1 tablet (25 mg total) by mouth daily.   metFORMIN  (GLUCOPHAGE ) 1000 MG tablet TAKE ONE TABLET BY MOUTH TWICE DAILY   metoprolol  (TOPROL -XL) 200 MG 24 hr tablet TAKE ONE TABLET BY MOUTH ONCE DAILY   NARCAN 4 MG/0.1ML LIQD nasal spray kit Place 0.4 mg into the nose as needed (accidental overdose).   OXYCONTIN  15 MG 12 hr tablet Take 15 mg by mouth 3 (three) times daily.   pregabalin (LYRICA) 100 MG capsule Take 100 mg by mouth 3 (three) times daily.   rosuvastatin  (CRESTOR ) 20 MG tablet TAKE ONE TABLET BY MOUTH ONCE DAILY   Semaglutide ,0.25 or 0.5MG /DOS, (OZEMPIC , 0.25 OR 0.5 MG/DOSE,) 2 MG/3ML SOPN Inject 0.25mg  into skin weekly for 4 weeks, then increase to 0.5mg  weekly. This replaces Mounjaro .   torsemide  (DEMADEX ) 20 MG tablet TAKE 2 TABLETS  BY MOUTH IN THE MORNING AND 1 TABLET EVERY EVENING.   zolpidem  (AMBIEN  CR) 12.5 MG CR tablet Take 12.5 mg by mouth at bedtime.    amiodarone  (PACERONE ) 200 MG tablet Take 1 tablet (200 mg total) by mouth 2 (two) times daily. (Patient not taking: Reported on 12/03/2023)   apixaban  (ELIQUIS ) 5 MG TABS tablet Take 1 tablet (5 mg total) by mouth 2 (two) times daily. (Patient not taking: Reported on 11/10/2023)   No facility-administered medications prior to visit.   Review of Systems  Constitutional:  Negative for appetite change, chills and fever.  Respiratory:  Negative  for chest tightness, shortness of breath and wheezing.   Cardiovascular:  Negative for chest pain and palpitations.  Gastrointestinal:  Negative for abdominal pain, nausea and vomiting.       Objective    BP 138/82 (BP Location: Left Arm, Patient Position: Sitting, Cuff Size: Large)   Pulse (!) 53   Resp 18   Ht 6' (1.829 m)   Wt (!) 310 lb (140.6 kg)   SpO2 96%   BMI 42.04 kg/m   Physical Exam   General: Appearance:    Severely obese male in no acute distress  Eyes:    PERRL, conjunctiva/corneas clear, EOM's intact       Lungs:     Clear to auscultation bilaterally, respirations unlabored  Heart:    Bradycardic. Irregularly irregular rhythm. No murmurs, rubs, or gallops.    MS:   All extremities are intact.    Neurologic:   Awake, alert, oriented x 3. No apparent focal neurological defect.           12/03/2023    1:25 PM  MMSE - Mini Mental State Exam  Orientation to time 3  Orientation to Place 5  Registration 2  Attention/ Calculation 5  Recall 2  Language- name 2 objects 2  Language- repeat 1  Language- follow 3 step command 3  Language- read & follow direction 1  Write a sentence 1  Copy design 1  Total score 26      Assessment & Plan     1. Type 2 diabetes mellitus with diabetic neuropathy, unspecified whether long term insulin  use (HCC) (Primary) Doing well on 0.5mg  Ozempic . Has already been sent 1mg  pens which he will start when th 0.5mg  runs out.   - Hemoglobin A1c - Urine microalbumin-creatinine with uACR  2. Hard of hearing Likely contributing to some MCI  3. Mild cognitive impairment Multifactorial. Like exacerbated by poor sleep and OSA. Is on Ambien  per pain clinic which may contribute to MCI. Anticipated improved sleep and less effect from OSA if he continues to lose weight.   4. Coronary artery disease involving native coronary artery of native heart without angina pectoris Asymptomatic. Compliant with medication.  Continue aggressive risk  factor modification.   - Comprehensive metabolic panel with GFR - Lipid panel - TSH  5. HFrEF (heart failure with reduced ejection fraction) (HCC)   6. Persistent atrial fibrillation (HCC) Prescribed DOAC. Rate well controlled.  Is overdue for follow up with cardiology.      Nancyann Perry, MD  [facility] [phone] (phone) [phone] (fax)  [facility]

## 2023-12-04 ENCOUNTER — Ambulatory Visit: Payer: Self-pay | Admitting: Family Medicine

## 2023-12-04 DIAGNOSIS — I4821 Permanent atrial fibrillation: Secondary | ICD-10-CM

## 2023-12-04 LAB — LIPID PANEL
Chol/HDL Ratio: 3.6 ratio (ref 0.0–5.0)
Cholesterol, Total: 125 mg/dL (ref 100–199)
HDL: 35 mg/dL — ABNORMAL LOW (ref >39)
LDL Chol Calc (NIH): 52 mg/dL (ref 0–99)
Triglycerides: 236 mg/dL — ABNORMAL HIGH (ref 0–149)
VLDL Cholesterol Cal: 38 mg/dL (ref 5–40)

## 2023-12-04 LAB — COMPREHENSIVE METABOLIC PANEL WITH GFR
ALT: 11 IU/L (ref 0–44)
AST: 15 IU/L (ref 0–40)
Albumin: 4 g/dL (ref 3.9–4.9)
Alkaline Phosphatase: 91 IU/L (ref 44–121)
BUN/Creatinine Ratio: 14 (ref 10–24)
BUN: 15 mg/dL (ref 8–27)
Bilirubin Total: 0.5 mg/dL (ref 0.0–1.2)
CO2: 21 mmol/L (ref 20–29)
Calcium: 9.4 mg/dL (ref 8.6–10.2)
Chloride: 92 mmol/L — ABNORMAL LOW (ref 96–106)
Creatinine, Ser: 1.05 mg/dL (ref 0.76–1.27)
Globulin, Total: 3.4 g/dL (ref 1.5–4.5)
Glucose: 327 mg/dL — ABNORMAL HIGH (ref 70–99)
Potassium: 4.4 mmol/L (ref 3.5–5.2)
Sodium: 134 mmol/L (ref 134–144)
Total Protein: 7.4 g/dL (ref 6.0–8.5)
eGFR: 78 mL/min/{1.73_m2} (ref >59)

## 2023-12-04 LAB — HEMOGLOBIN A1C
Est. average glucose Bld gHb Est-mCnc: 306 mg/dL
Hgb A1c MFr Bld: 12.3 % — ABNORMAL HIGH (ref 4.8–5.6)

## 2023-12-04 LAB — MICROALBUMIN / CREATININE URINE RATIO
Creatinine, Urine: 39.4 mg/dL
Microalb/Creat Ratio: <8 mg/g{creat} (ref 0–29)
Microalbumin, Urine: <3 ug/mL

## 2023-12-04 LAB — TSH: TSH: 2.91 u[IU]/mL (ref 0.450–4.500)

## 2023-12-05 NOTE — Telephone Encounter (Signed)
 Addressed at o. 10-03-2023

## 2023-12-06 ENCOUNTER — Telehealth: Payer: Self-pay | Admitting: Family Medicine

## 2023-12-06 ENCOUNTER — Other Ambulatory Visit: Payer: Self-pay | Admitting: Family Medicine

## 2023-12-06 ENCOUNTER — Other Ambulatory Visit: Payer: Self-pay

## 2023-12-06 DIAGNOSIS — E1140 Type 2 diabetes mellitus with diabetic neuropathy, unspecified: Secondary | ICD-10-CM

## 2023-12-06 MED ORDER — TORSEMIDE 20 MG PO TABS: ORAL_TABLET | ORAL | 3 refills | Status: DC

## 2023-12-06 NOTE — Telephone Encounter (Signed)
 Converted to refill request and filled

## 2023-12-06 NOTE — Telephone Encounter (Signed)
 [employer] is requesting refill torsemide  (DEMADEX ) 20 MG tablet  Please advise

## 2023-12-07 MED ORDER — APIXABAN 5 MG PO TABS: 5.0000 mg | ORAL_TABLET | Freq: Two times a day (BID) | ORAL | 5 refills | Status: DC

## 2023-12-16 ENCOUNTER — Other Ambulatory Visit: Payer: Self-pay

## 2023-12-16 ENCOUNTER — Telehealth: Payer: Self-pay

## 2023-12-16 NOTE — Progress Notes (Signed)
   12/16/2023  Patient ID: Taylor [name], male   DOB: November 16, 1955, 68 y.o.   MRN: [id number]  Attempted to contact patient for medication management/review. Left HIPAA compliant message for patient to return my call at their convenience.   First attempt for patient outreach. Will follow up with patient when rescheduled.   Thank you for allowing pharmacy to be a part of this patient's care.  Dorcas Solian, PharmD Clinical Pharmacist Cell: [phone]

## 2023-12-22 ENCOUNTER — Telehealth: Payer: Self-pay | Admitting: Family Medicine

## 2023-12-22 NOTE — Telephone Encounter (Signed)
 Contacted Dr. Lanis. Consider referral to sleep specialist and neurology.

## 2023-12-22 NOTE — Telephone Encounter (Signed)
 Copied from CRM [id number]. Topic: General - Other >> Dec 22, 2023  4:04 PM Delon DASEN wrote: Reason for CRM: [facility] - Dr Ila Buckle would like a call from Dr Gasper on his personal cell [phone]- office [phone]  needs to discuss mental awareness and follow up with cardiology

## 2024-02-09 ENCOUNTER — Ambulatory Visit: Admitting: Family Medicine

## 2024-03-03 ENCOUNTER — Telehealth: Payer: Self-pay

## 2024-03-03 NOTE — Progress Notes (Signed)
 Complex Care Management Note Care Guide Note  03/03/2024 Name: Taylor [name] MRN: [id number] DOB: 01/11/1956   Complex Care Management Outreach Attempts: An unsuccessful telephone outreach was attempted today to offer the patient information about available complex care management services.  Follow Up Plan:  Additional outreach attempts will be made to offer the patient complex care management information and services.   Encounter Outcome:  No Answer  Dreama [facility]  [facility], [facility] VBCI Assistant Direct Dial: [phone]  Fax: [phone]

## 2024-03-07 ENCOUNTER — Other Ambulatory Visit: Payer: Self-pay | Admitting: Family Medicine

## 2024-03-07 DIAGNOSIS — I10 Essential (primary) hypertension: Secondary | ICD-10-CM

## 2024-03-07 NOTE — Progress Notes (Signed)
 Complex Care Management Note Care Guide Note  03/07/2024 Name: Taylor [name] MRN: [id number] DOB: Nov 03, 1955   Complex Care Management Outreach Attempts: A second unsuccessful outreach was attempted today to offer the patient with information about available complex care management services.  Follow Up Plan:  No further outreach attempts will be made at this time. We have been unable to contact the patient to offer or enroll patient in complex care management services.  Encounter Outcome:  No Answer  Dreama [facility]  [facility], [facility] VBCI Assistant Direct Dial: [phone]  Fax: [phone]

## 2024-03-09 ENCOUNTER — Telehealth: Payer: Self-pay | Admitting: Family Medicine

## 2024-03-09 NOTE — Telephone Encounter (Signed)
 Copied from CRM [id number]. Topic: Clinical - Medication Refill >> Mar 09, 2024 12:23 PM Rosaria E wrote: Medication: Semaglutide ,0.25 or 0.5MG /DOS, (OZEMPIC , 0.25 OR 0.5 MG/DOSE,) 2 MG/3ML SOPN   Pt receives this from PCP's office, says he cannot afford this from the pharmacy.

## 2024-03-10 NOTE — Telephone Encounter (Signed)
 Contacted patient on number listed. Received voicemial. Ok to advise HIM if call is returned

## 2024-03-10 NOTE — Telephone Encounter (Unsigned)
 Copied from CRM [id number]. Topic: Clinical - Medication Refill >> Mar 09, 2024 12:23 PM Rosaria E wrote: Medication: Semaglutide ,0.25 or 0.5MG /DOS, (OZEMPIC , 0.25 OR 0.5 MG/DOSE,) 2 MG/3ML SOPN   Pt receives this from PCP's office >> Mar 09, 2024 12:25 PM Rosaria BRAVO wrote: Please advise, sister called Ellouise Glatter [phone]

## 2024-03-10 NOTE — Telephone Encounter (Signed)
 He is overdue for follow appointment and needs to schedule before refill of Ozempic  can be approved. also, need to know if he is taking 0.25mg  or 0.5mg  a week.

## 2024-03-10 NOTE — Telephone Encounter (Signed)
 Contacted sister and received voicemail. Please advise if call is returned unfortunately she is not on patients DPR so we would not be able to further assist her at this time. Please have the patient contact us  at the office for any questions or concerns he may have. If he would like for her to speak to us  on his behalf he would need to come into the office and update his DPR

## 2024-03-13 ENCOUNTER — Encounter: Payer: Self-pay | Admitting: Family Medicine

## 2024-03-13 ENCOUNTER — Ambulatory Visit (INDEPENDENT_AMBULATORY_CARE_PROVIDER_SITE_OTHER): Admitting: Family Medicine

## 2024-03-13 VITALS — BP 127/87 | HR 60 | Wt 310.0 lb

## 2024-03-13 DIAGNOSIS — M5030 Other cervical disc degeneration, unspecified cervical region: Secondary | ICD-10-CM

## 2024-03-13 DIAGNOSIS — M961 Postlaminectomy syndrome, not elsewhere classified: Secondary | ICD-10-CM

## 2024-03-13 DIAGNOSIS — Z23 Encounter for immunization: Secondary | ICD-10-CM

## 2024-03-13 DIAGNOSIS — Z7985 Long-term (current) use of injectable non-insulin antidiabetic drugs: Secondary | ICD-10-CM

## 2024-03-13 DIAGNOSIS — G894 Chronic pain syndrome: Secondary | ICD-10-CM | POA: Diagnosis not present

## 2024-03-13 DIAGNOSIS — Z7409 Other reduced mobility: Secondary | ICD-10-CM

## 2024-03-13 DIAGNOSIS — E1140 Type 2 diabetes mellitus with diabetic neuropathy, unspecified: Secondary | ICD-10-CM | POA: Diagnosis not present

## 2024-03-13 DIAGNOSIS — I5020 Unspecified systolic (congestive) heart failure: Secondary | ICD-10-CM

## 2024-03-13 DIAGNOSIS — M4802 Spinal stenosis, cervical region: Secondary | ICD-10-CM

## 2024-03-13 DIAGNOSIS — G3184 Mild cognitive impairment, so stated: Secondary | ICD-10-CM | POA: Diagnosis not present

## 2024-03-13 DIAGNOSIS — M51369 Other intervertebral disc degeneration, lumbar region without mention of lumbar back pain or lower extremity pain: Secondary | ICD-10-CM

## 2024-03-13 DIAGNOSIS — G629 Polyneuropathy, unspecified: Secondary | ICD-10-CM | POA: Diagnosis not present

## 2024-03-13 LAB — POCT GLYCOSYLATED HEMOGLOBIN (HGB A1C)
Est. average glucose Bld gHb Est-mCnc: 275
Hemoglobin A1C: 11.2 % — AB (ref 4.0–5.6)

## 2024-03-13 MED ORDER — TIRZEPATIDE 2.5 MG/0.5ML ~~LOC~~ SOAJ: 2.5000 mg | SUBCUTANEOUS | Status: DC

## 2024-03-13 NOTE — Patient Instructions (Signed)
 Start Mounjaro  by taking one injection once a week for 4 weeks, then go up to injection per week

## 2024-03-16 NOTE — Progress Notes (Signed)
 Taylor [name]                                          MRN: [id number]   03/16/2024   The VBCI Quality Team Specialist reviewed this patient medical record for the purposes of chart review for care gap closure. The following were reviewed: chart review for care gap closure-glycemic status assessment.    VBCI Quality Team

## 2024-03-30 NOTE — Progress Notes (Signed)
 FRANCIS DOENGES                                          MRN: [id number]   03/30/2024   The VBCI Quality Team Specialist reviewed this patient medical record for the purposes of chart review for care gap closure. The following were reviewed: chart review for care gap closure-colorectal cancer screening and diabetic eye exam.    VBCI Quality Team

## 2024-04-03 ENCOUNTER — Telehealth: Payer: Self-pay

## 2024-04-03 NOTE — Telephone Encounter (Signed)
 Called [employer] back, spoke with Ra and verified that patient does have diabetes.

## 2024-04-03 NOTE — Progress Notes (Signed)
 Established patient visit   Patient: Taylor [name]   DOB: 08/08/1955   68 y.o. Male  MRN: [id number] Visit Date: 03/13/2024  Today's healthcare provider: Nancyann Perry, MD   Chief Complaint  Patient presents with   Medical Management of Chronic Issues    T2DM, HTN Needs Ozempic  refill   Subjective    Discussed the use of AI scribe software for clinical note transcription with the patient, who gave verbal consent to proceed.  History of Present Illness   Taylor [name] is a 68 year old male with diabetes who presents for a checkup  His recent A1c was 11.2. He is currently taking Ozempic  2 mg weekly, but missed his dose last week due to running out of medication. He also takes Farxiga , but has not been taking Eliquis  due to cost issues. His sister manages his medications weekly.  He has a history of requiring pain management and is in need of a new pain doctor due to communication issues with his current provider. His sister mentioned the difficulty in understanding the current pain doctor over the phone and the long drive required for visits.  He is seeking assistance with home health services for daily activities such as washing clothes and cooking, as he forgets things and is unable to perform these tasks independently. His sister provides significant support in managing his medications and daily care.       Medications: Outpatient Medications Prior to Visit  Medication Sig Note   dapagliflozin  propanediol (FARXIGA ) 10 MG TABS tablet Take 10 mg by mouth daily.    glipiZIDE  (GLUCOTROL  XL) 10 MG 24 hr tablet TAKE ONE TABLET BY MOUTH EVERY DAY    metFORMIN  (GLUCOPHAGE ) 1000 MG tablet TAKE ONE TABLET BY MOUTH TWICE DAILY    metoprolol  (TOPROL -XL) 200 MG 24 hr tablet TAKE ONE TABLET BY MOUTH ONCE DAILY    NARCAN 4 MG/0.1ML LIQD nasal spray kit Place 0.4 mg into the nose as needed (accidental overdose).    OXYCONTIN  15 MG 12 hr tablet Take 15 mg by mouth 3  (three) times daily.    pregabalin (LYRICA) 100 MG capsule Take 100 mg by mouth 3 (three) times daily.    rosuvastatin  (CRESTOR ) 20 MG tablet TAKE ONE TABLET BY MOUTH ONCE DAILY    torsemide  (DEMADEX ) 20 MG tablet TAKE 2 TABLETS BY MOUTH IN THE MORNING AND 1 TABLET EVERY EVENING.    zolpidem  (AMBIEN  CR) 12.5 MG CR tablet Take 12.5 mg by mouth at bedtime.     amiodarone  (PACERONE ) 200 MG tablet Take 1 tablet (200 mg total) by mouth 2 (two) times daily. (Patient not taking: Reported on 12/03/2023)    apixaban  (ELIQUIS ) 5 MG TABS tablet Take 1 tablet (5 mg total) by mouth 2 (two) times daily. (Patient not taking: Reported on 03/13/2024)    losartan  (COZAAR ) 25 MG tablet Take 1 tablet (25 mg total) by mouth daily.    [DISCONTINUED] Semaglutide ,0.25 or 0.5MG /DOS, (OZEMPIC , 0.25 OR 0.5 MG/DOSE,) 2 MG/3ML SOPN Inject 0.25mg  into skin weekly for 4 weeks, then increase to 0.5mg  weekly. This replaces Mounjaro . 03/13/2024: NOT EFFECTIVE, CHANGED TO MOUNJARO    No facility-administered medications prior to visit.   Review of Systems     Objective    BP 127/87 (BP Location: Left Arm, Patient Position: Sitting, Cuff Size: Normal)   Pulse 60   Wt (!) 310 lb (140.6 kg)   SpO2 97%   BMI 42.04 kg/m   Physical Exam  General: Appearance:    Severely obese male in no acute distress  Eyes:    PERRL, conjunctiva/corneas clear, EOM's intact       Lungs:     Clear to auscultation bilaterally, respirations unlabored  Heart:    Normal heart rate. Irregularly irregular rhythm. No murmurs, rubs, or gallops.    MS:   All extremities are intact.    Neurologic:   Awake, alert, oriented x 3. No apparent focal neurological defect.         Results for orders placed or performed in visit on 03/13/24  POCT glycosylated hemoglobin (Hb A1C)  Result Value Ref Range   Hemoglobin A1C 11.2 (A) 4.0 - 5.6 %   Est. average glucose Bld gHb Est-mCnc 275     Assessment & Plan     1. Type 2 diabetes mellitus with diabetic  neuropathy, unspecified whether long term insulin  use (HCC) (Primary) Uncontrolled on highest tolerated dose of Ozmpic. Change to tirzepatide  (MOUNJARO ) 2.5 MG/0.5ML Pen; Inject 2.5 mg into the skin once a week. for 4 weeks, then go up to TWO injections per week  2. HFrEF (heart failure with reduced ejection fraction) (HCC) Unable to perform most activities of daily living due to back pain and activity limitations from CHF - Ambulatory referral to Home Health  3. Neuropathy  No longer able to travel to [facility] to see Dr. Lanis and needs to establish with local pain clinic.   - Ambulatory referral to Pain Clinic  4. Cervical foraminal stenosis (Left: C4-5) (Bilateral: C5-6)  - Ambulatory referral to Pain Clinic  5. DDD (degenerative disc disease), cervical  - Ambulatory referral to Pain Clinic  6. Degeneration of intervertebral disc of lumbar region, unspecified whether pain present  - Ambulatory referral to Pain Clinic  7. Mild cognitive impairment Impaired ability to safely managed his home medications. Needs home RN to assist with medication management.  - Ambulatory referral to Home Health  8. Chronic pain syndrome  - Ambulatory referral to Pain Clinic  9. Failed back surgical syndrome  - Ambulatory referral to Pain Clinic  10. Impaired mobility HH to assist with ADLs.   11. Influenza vaccine needed  - Flu vaccine HIGH DOSE PF(Fluzone Trivalent)      Nancyann Perry, MD  [facility] [phone] (phone) [phone] (fax)  [facility]

## 2024-04-03 NOTE — Telephone Encounter (Signed)
 Copied from CRM [id number]. Topic: General - Other >> Apr 03, 2024  1:48 PM Joesph NOVAK wrote: Reason for CRM: Hadassah from [facility] is calling to verify if patient has diabetes. This is for Chronic condition special needs plan through [facility].

## 2024-04-20 ENCOUNTER — Telehealth: Payer: Self-pay

## 2024-04-20 NOTE — Progress Notes (Signed)
 Pharmacy Quality Measure Review  This patient is appearing on a report for being at risk of failing the Glycemic Status Assessment in Diabetes measure this calendar year.   Last documented A1c 11.2% on 03/13/24  Scheduled follow up with PCP and pharmacy same day appointment on 04/26/24.  Qianna Clagett E. Marsh, PharmD, CPP Clinical Pharmacist [facility] [phone]

## 2024-04-21 ENCOUNTER — Other Ambulatory Visit (HOSPITAL_COMMUNITY): Payer: Self-pay

## 2024-04-23 ENCOUNTER — Other Ambulatory Visit: Payer: Self-pay | Admitting: Family Medicine

## 2024-04-26 ENCOUNTER — Ambulatory Visit (INDEPENDENT_AMBULATORY_CARE_PROVIDER_SITE_OTHER): Admitting: Family Medicine

## 2024-04-26 ENCOUNTER — Encounter: Payer: Self-pay | Admitting: Family Medicine

## 2024-04-26 ENCOUNTER — Ambulatory Visit

## 2024-04-26 VITALS — BP 115/82 | HR 92 | Resp 16 | Wt 311.0 lb

## 2024-04-26 DIAGNOSIS — I5020 Unspecified systolic (congestive) heart failure: Secondary | ICD-10-CM | POA: Diagnosis not present

## 2024-04-26 DIAGNOSIS — I10 Essential (primary) hypertension: Secondary | ICD-10-CM

## 2024-04-26 DIAGNOSIS — I4819 Other persistent atrial fibrillation: Secondary | ICD-10-CM | POA: Diagnosis not present

## 2024-04-26 DIAGNOSIS — Z23 Encounter for immunization: Secondary | ICD-10-CM

## 2024-04-26 DIAGNOSIS — E1140 Type 2 diabetes mellitus with diabetic neuropathy, unspecified: Secondary | ICD-10-CM

## 2024-04-26 DIAGNOSIS — Z7985 Long-term (current) use of injectable non-insulin antidiabetic drugs: Secondary | ICD-10-CM | POA: Diagnosis not present

## 2024-04-26 DIAGNOSIS — K760 Fatty (change of) liver, not elsewhere classified: Secondary | ICD-10-CM | POA: Diagnosis not present

## 2024-04-26 MED ORDER — TIRZEPATIDE 2.5 MG/0.5ML ~~LOC~~ SOAJ: 2.5000 mg | SUBCUTANEOUS | Status: DC

## 2024-04-26 MED ORDER — APIXABAN 5 MG PO TABS: 5.0000 mg | ORAL_TABLET | Freq: Two times a day (BID) | ORAL | Status: AC

## 2024-04-26 NOTE — Patient Instructions (Addendum)
 Please review the attached list of medications and notify my office if there are any errors.   Take Eliquis  5mg  tablet, 1 tablet twice every day  Take Mounjaro  2.5mg , 1 injection two times every week

## 2024-04-26 NOTE — Progress Notes (Signed)
 Established patient visit   Patient: Taylor [name]   DOB: 08/19/1955   68 y.o. Male  MRN: [id number] Visit Date: 04/26/2024  Today's healthcare provider: Nancyann Perry, MD   Chief Complaint  Patient presents with   Medical Management of Chronic Issues   Subjective    Discussed the use of AI scribe software for clinical note transcription with the patient, who gave verbal consent to proceed.  History of Present Illness   Taylor [name] is a 68 year old male with type 2 diabetes and atrial fibrillation who presents for follow-up.  Six weeks ago, his hemoglobin A1c was 11.2, and he was prescribed tirzepatide  2 mg once a week, discontinuing Ozempic  due to intolerance. He takes one shot of tirzepatide  per week without side effects, noting it's not bothering his stomach. He checks his blood sugar once a day, mostly in the morning, and occasionally when he has a headache. His blood sugar levels have not been real high or real low. He still has two shots left from the samples provided.  He has a history of atrial fibrillation and has not been taking Eliquis . He has not experienced any breathing difficulties or shortness of breath. His activity level is low, as he spends most of his time at home, watching TV, and occasionally going to the bathroom or kitchen. He used to drive to [place] every weekend to pick up his daughter but no longer does so as she has grown up.  He mentions a recent interaction with a home health care agency, where a representative left abruptly after confirming he takes his medications, which are organized by his sister.     Lab Results  Component Value Date   HGBA1C 11.2 (A) 03/13/2024   HGBA1C 12.3 (H) 12/03/2023   HGBA1C 14.9 (H) 07/30/2023   Lab Results  Component Value Date   NA 134 12/03/2023   K 4.4 12/03/2023   CREATININE 1.05 12/03/2023   EGFR 78 12/03/2023   GLUCOSE 327 (H) 12/03/2023   Lab Results  Component Value Date   CHOL  125 12/03/2023   HDL 35 (L) 12/03/2023   LDLCALC 52 12/03/2023   LDLDIRECT 90 01/15/2023   TRIG 236 (H) 12/03/2023   CHOLHDL 3.6 12/03/2023     Medications: Outpatient Medications Prior to Visit  Medication Sig   dapagliflozin  propanediol (FARXIGA ) 10 MG TABS tablet Take 10 mg by mouth daily.   glipiZIDE  (GLUCOTROL  XL) 10 MG 24 hr tablet TAKE ONE TABLET BY MOUTH EVERY DAY   losartan  (COZAAR ) 25 MG tablet Take 1 tablet (25 mg total) by mouth daily.   metFORMIN  (GLUCOPHAGE ) 1000 MG tablet TAKE ONE TABLET BY MOUTH TWICE DAILY   metoprolol  (TOPROL -XL) 200 MG 24 hr tablet TAKE ONE TABLET BY MOUTH ONCE DAILY   NARCAN 4 MG/0.1ML LIQD nasal spray kit Place 0.4 mg into the nose as needed (accidental overdose).   OXYCONTIN  15 MG 12 hr tablet Take 15 mg by mouth 3 (three) times daily.   pregabalin (LYRICA) 100 MG capsule Take 100 mg by mouth 3 (three) times daily.   rosuvastatin  (CRESTOR ) 20 MG tablet TAKE ONE TABLET BY MOUTH ONCE DAILY   torsemide  (DEMADEX ) 20 MG tablet TAKE TWO TABLETS BY MOUTH EVERY MORNING AND TAKE ONE TABLET EVERY EVENING   zolpidem  (AMBIEN  CR) 12.5 MG CR tablet Take 12.5 mg by mouth at bedtime.    tirzepatide  (MOUNJARO ) 2.5 MG/0.5ML Pen Inject 2.5 mg into the skin once a week.  for 4 weeks, then go up to TWO injections per week   amiodarone  (PACERONE ) 200 MG tablet Take 1 tablet (200 mg total) by mouth 2 (two) times daily. (Patient not taking: Reported on 04/26/2024)   apixaban  (ELIQUIS ) 5 MG TABS tablet Take 1 tablet (5 mg total) by mouth 2 (two) times daily. (Patient not taking: Reported on 04/26/2024)   No facility-administered medications prior to visit.   Review of Systems  Constitutional:  Negative for appetite change, chills and fever.  Respiratory:  Negative for chest tightness, shortness of breath and wheezing.   Cardiovascular:  Negative for chest pain and palpitations.  Gastrointestinal:  Negative for abdominal pain, nausea and vomiting.       Objective     BP 115/82 (BP Location: Left Arm, Patient Position: Sitting, Cuff Size: Large)   Pulse 92   Resp 16   Wt (!) 311 lb (141.1 kg)   SpO2 97%   BMI 42.18 kg/m   Physical Exam   General: Appearance:    Severely obese male in no acute distress  Eyes:    PERRL, conjunctiva/corneas clear, EOM's intact       Lungs:     Clear to auscultation bilaterally, respirations unlabored  Heart:    Normal heart rate. Irregularly irregular rhythm. No murmurs, rubs, or gallops.    MS:   All extremities are intact.    Neurologic:   Awake, alert, oriented x 3. No apparent focal neurological defect.        Assessment & Plan    1. Type 2 diabetes mellitus with diabetic neuropathy, unspecified whether long term insulin  use (HCC) Tolerating initiation of Mounjaro . Did not tolerate effective dose of Ozempic .   Given 2 sample boxes today and increase tirzepatide  (MOUNJARO ) 2.5 MG/0.5ML Pen; to Inject 2.5 mg into the skin once a week. Inject 2.5mg  into the skin TWO TIMES EVERY WEEK.  Dispense: 4 mL  Working on trying to get patient assistance, but can keep him supplied with samples through the end of this year. Will have follow up office visit in 1 month.   2. Persistent atrial fibrillation (HCC) Has been out of apixaban , but working with pharmacist for patient assistance. Given samples today.   - apixaban  (ELIQUIS ) 5 MG TABS tablet; Take 1 tablet (5 mg total) by mouth 2 (two) times daily.  Dispense: 56 tablet  IRIR rhythm today. Previously prescribed amiodarone  by cardiology who he is no longer seeing. Rate controlled, now getting back on DOAC.   - ECHOCARDIOGRAM COMPLETE; Future  3. Heart failure with mid-range ejection fraction (HFmEF) (HCC) No sign of overt CHF today. No longer being followed by cardiology. - ECHOCARDIOGRAM COMPLETE; Future  4. Primary hypertension Well controlled.  Continue current medications.    5. Fatty liver Would benefit from GLP-1 agonist as above.   6. Need for  pneumococcal vaccination (Primary)  - Pneumococcal conjugate vaccine 20-valent (Prevnar 20)  Return in about 1 month (around 05/26/2024) for Diabetes.     Taylor Perry, MD  [facility] [phone] (phone) [phone] (fax)  [facility]

## 2024-04-26 NOTE — Progress Notes (Deleted)
 S:     Reason for visit: ?  Taylor [name] is a 68 y.o. male with a history of diabetes (type 2), who presents today for an initial diabetes Face to Face pharmacotherapy visit.? Pertinent PMH also includes HTN, HFrEF, CAD, OSA, obesity.  Known DM Complications: {DM complications:33329}   Care Team: Primary Care Provider: Gasper Nancyann BRAVO, MD  At last visit, ***.   Patient reports Diabetes was diagnosed in ***.   Current diabetes medications include: Mounjaro  2.5 mg weekly, metformin  1000 mg BID, glipizide  XL 10 mg daily, Farxiga  10 mg daily Previous diabetes medications include: *** Current hypertension medications include: losasrtan 25 mg daily, Toprol  XL 200 mg daily, torsemide  20 mg 2 tablets QAM and 1 tablet QPM Current hyperlipidemia medications include: rosuvastatin  20 mg daily   Patient reports adherence to taking all medications as prescribed.  *** Patient denies adherence with medications, reports missing *** medications *** times per week, on average.  Have you been experiencing any side effects to the medications prescribed? {YES NO:22349} Do you have any problems obtaining medications due to transportation or finances? {YES [id number] Insurance coverage: ***  Current medication access support: ***  Patient {Actions; denies-reports:120008} hypoglycemic events.  Reported home fasting blood sugars: ***  Reported 2 hour post-meal/random blood sugars: ***.  Patient {Actions; denies-reports:120008} nocturia (nighttime urination).  Patient {Actions; denies-reports:120008} neuropathy (nerve pain). Patient {Actions; denies-reports:120008} visual changes. Patient {Actions; denies-reports:120008} self foot exams.   Patient reported dietary habits: Eats *** meals/day Breakfast: *** Lunch: *** Dinner: *** Snacks: *** Drinks: ***  Patient-reported exercise habits: *** DM Prevention:  Statin: {Blank single:19197::***,Taking,Not taking,Intolerant  to,Declines}; {Blank single:19197::low intensity,moderate intensity,high intensity,n/a}.?  ACE/ARB: {Blank single:19197::yes,no}; *** History of chronic kidney disease? {Blank single:19197::yes,no} Last urinary albumin/creatinine ratio:  Lab Results  Component Value Date   MICRALBCREAT <8 12/03/2023   MICRALBCREAT 11 08/05/2022   MICRALBCREAT n/a 03/31/2017   Last eye exam:  Lab Results  Component Value Date   HMDIABEYEEXA No Retinopathy 11/07/2020   Lab Results  Component Value Date   HMDIABEYEEXA No Retinopathy 11/07/2020   Last foot exam: No foot exam found Tobacco Use:  Tobacco Use: Low Risk  (03/13/2024)   Patient History    Smoking Tobacco Use: Never    Smokeless Tobacco Use: Never    Passive Exposure: Past   O:  {CGM reports:33570} 7 day average blood glucose: ***  Libre3 CGM Download today *** % Time CGM is active: ***% Average Glucose: *** mg/dL Glucose Management Indicator: ***  Glucose Variability: ***% (goal <36%) Time in Goal:  - Time in range 70-180: ***% - Time above range: ***% - Time below range: ***% Observed patterns:  Vitals:  Wt Readings from Last 3 Encounters:  03/13/24 (!) 310 lb (140.6 kg)  12/03/23 (!) 310 lb (140.6 kg)  07/30/23 (!) 329 lb (149.2 kg)   BP Readings from Last 3 Encounters:  03/13/24 127/87  12/03/23 138/82  07/30/23 100/69   Pulse Readings from Last 3 Encounters:  03/13/24 60  12/03/23 (!) 53  07/30/23 91     Labs:?  Lab Results  Component Value Date   HGBA1C 11.2 (A) 03/13/2024   HGBA1C 12.3 (H) 12/03/2023   HGBA1C 14.9 (H) 07/30/2023   GLUCOSE 327 (H) 12/03/2023   MICRALBCREAT <8 12/03/2023   MICRALBCREAT 11 08/05/2022   MICRALBCREAT n/a 03/31/2017   CREATININE 1.05 12/03/2023   CREATININE 1.25 07/30/2023   CREATININE 1.16 05/14/2023    Lab Results  Component Value Date  CHOL 125 12/03/2023   LDLCALC 52 12/03/2023   LDLCALC 56 05/14/2023   LDLCALC 80 01/15/2023   LDLDIRECT 90  01/15/2023   HDL 35 (L) 12/03/2023   TRIG 236 (H) 12/03/2023   TRIG 229 (H) 05/14/2023   TRIG 145 01/15/2023   ALT 11 12/03/2023   ALT 12 05/14/2023   AST 15 12/03/2023   AST 19 05/14/2023      Chemistry      Component Value Date/Time   NA 134 12/03/2023 1410   K 4.4 12/03/2023 1410   CL 92 (L) 12/03/2023 1410   CO2 21 12/03/2023 1410   BUN 15 12/03/2023 1410   CREATININE 1.05 12/03/2023 1410   GLU 222 08/01/2014 0000      Component Value Date/Time   CALCIUM  9.4 12/03/2023 1410   ALKPHOS 91 12/03/2023 1410   AST 15 12/03/2023 1410   ALT 11 12/03/2023 1410   BILITOT 0.5 12/03/2023 1410       The ASCVD Risk score (Arnett DK, et al., 2019) failed to calculate for the following reasons:   The valid total cholesterol range is 130 to 320 mg/dL  Lab Results  Component Value Date   MICRALBCREAT <8 12/03/2023   MICRALBCREAT 11 08/05/2022   MICRALBCREAT n/a 03/31/2017    A/P: Diabetes currently uncontrolled with a most recent A1c of 11.2% on 03/13/24, which is down from 12.3% on 12/03/23. Patient is *** able to verbalize appropriate hypoglycemia management plan. Medication adherence appears ***. Control is suboptimal due to ***. -{Meds adjust:18428} basal insulin  {basal insulins:33573}  *** units daily.  -{Meds adjust:18428} rapid insulin  {bolus insulin :33574} ***.  -{Meds adjust:18428} GLP-1 {GLP1 options:33572} *** mg .  -{Meds adjust:18428} SGLT2-I {SGLT2i options:33571}*** mg daily.  -{Meds adjust:18428} metformin  ***.  -Patient educated on purpose, proper use, and potential adverse effects of ***.  -Extensively discussed pathophysiology of diabetes, recommended lifestyle interventions, dietary effects on blood sugar control.  -Counseled on s/sx of and management of hypoglycemia.  -Next A1c anticipated ***.   ASCVD risk - {primary/secondary:33575} prevention in patient with diabetes. Last LDL is 52 mg/dL, at goal of <29 mg/dL. ASCVD risk factors include *** and 10-year  ASCVD risk score of ***. {Desc; low/moderate/high:110033} intensity statin indicated.  -{Meds adjust:18428} {statin therapies:33576} *** mg daily.  -{Meds adjust:18428} ezetimibe 10 mg daily   Hypertension longstanding *** currently ***. Blood pressure goal of <130/80 *** mmHg. Medication adherence ***. Blood pressure control is suboptimal due to ***. -{Meds adjust:18428} *** mg ***.  {pharmacisttime:33368}  Follow-up:  Pharmacist on *** PCP clinic visit on ***  Peyton CHARLENA Ferries, PharmD, CPP Clinical Pharmacist [facility] [phone]

## 2024-05-01 ENCOUNTER — Other Ambulatory Visit (HOSPITAL_COMMUNITY): Payer: Self-pay

## 2024-05-01 NOTE — Telephone Encounter (Signed)
 I have applied for the [employer], however, they are requesting a document to verify the diagnosis. I have filled out most of it and faxed the form to the provider's office to finish filling out and to sign. Thanks!

## 2024-05-11 ENCOUNTER — Ambulatory Visit

## 2024-05-11 NOTE — Progress Notes (Deleted)
 S:     Reason for visit: ?  Taylor [name] is a 68 y.o. male with a history of diabetes (type 2), who presents today for an initial diabetes Face to Face pharmacotherapy visit.? Pertinent PMH also includes HTN, HFrEF, CAD, OSA, obesity.  Known DM Complications: {DM complications:33329}   Care Team: Primary Care Provider: Gasper Nancyann BRAVO, MD  At last visit with PCP on 04/26/24, patient was provided Mounjaro  2.5 mg weekly samples d/t cost of prescription on insurance.  Patient reports Diabetes was diagnosed in ***.   Current diabetes medications include: Mounjaro  2.5 mg weekly, metformin  1000 mg BID, glipizide  XL 10 mg daily, Farxiga  10 mg daily Previous diabetes medications include: *** Current hypertension medications include: losasrtan 25 mg daily, Toprol  XL 200 mg daily, torsemide  20 mg 2 tablets QAM and 1 tablet QPM Current hyperlipidemia medications include: rosuvastatin  20 mg daily   Patient reports adherence to taking all medications as prescribed.  *** Patient denies adherence with medications, reports missing *** medications *** times per week, on average.  Have you been experiencing any side effects to the medications prescribed? {YES NO:22349} Do you have any problems obtaining medications due to transportation or finances? {YES [facility] Insurance coverage: [employer]  Current medication access support: ***  Patient {Actions; denies-reports:120008} hypoglycemic events.  Reported home fasting blood sugars: ***  Reported 2 hour post-meal/random blood sugars: ***.  Patient {Actions; denies-reports:120008} nocturia (nighttime urination).  Patient {Actions; denies-reports:120008} neuropathy (nerve pain). Patient {Actions; denies-reports:120008} visual changes. Patient {Actions; denies-reports:120008} self foot exams.   Patient reported dietary habits: Eats *** meals/day Breakfast: *** Lunch: *** Dinner: *** Snacks: *** Drinks: ***  Patient-reported  exercise habits: *** DM Prevention:  Statin: {Blank single:19197::***,Taking,Not taking,Intolerant to,Declines}; {Blank single:19197::low intensity,moderate intensity,high intensity,n/a}.?  ACE/ARB: {Blank single:19197::yes,no}; *** History of chronic kidney disease? {Blank single:19197::yes,no} Last urinary albumin/creatinine ratio:  Lab Results  Component Value Date   MICRALBCREAT <8 12/03/2023   MICRALBCREAT 11 08/05/2022   MICRALBCREAT n/a 03/31/2017   Last eye exam:  Lab Results  Component Value Date   HMDIABEYEEXA No Retinopathy 11/07/2020   Lab Results  Component Value Date   HMDIABEYEEXA No Retinopathy 11/07/2020   Last foot exam: No foot exam found Tobacco Use:  Tobacco Use: Low Risk  (04/26/2024)   Patient History    Smoking Tobacco Use: Never    Smokeless Tobacco Use: Never    Passive Exposure: Past   O:  {CGM reports:33570} 7 day average blood glucose: ***  Libre3 CGM Download today *** % Time CGM is active: ***% Average Glucose: *** mg/dL Glucose Management Indicator: ***  Glucose Variability: ***% (goal <36%) Time in Goal:  - Time in range 70-180: ***% - Time above range: ***% - Time below range: ***% Observed patterns:  Vitals:  Wt Readings from Last 3 Encounters:  04/26/24 (!) 311 lb (141.1 kg)  03/13/24 (!) 310 lb (140.6 kg)  12/03/23 (!) 310 lb (140.6 kg)   BP Readings from Last 3 Encounters:  04/26/24 115/82  03/13/24 127/87  12/03/23 138/82   Pulse Readings from Last 3 Encounters:  04/26/24 92  03/13/24 60  12/03/23 (!) 53     Labs:?  Lab Results  Component Value Date   HGBA1C 11.2 (A) 03/13/2024   HGBA1C 12.3 (H) 12/03/2023   HGBA1C 14.9 (H) 07/30/2023   GLUCOSE 327 (H) 12/03/2023   MICRALBCREAT <8 12/03/2023   MICRALBCREAT 11 08/05/2022   MICRALBCREAT n/a 03/31/2017   CREATININE 1.05 12/03/2023   CREATININE 1.25  07/30/2023   CREATININE 1.16 05/14/2023    Lab Results  Component Value Date    CHOL 125 12/03/2023   LDLCALC 52 12/03/2023   LDLCALC 56 05/14/2023   LDLCALC 80 01/15/2023   LDLDIRECT 90 01/15/2023   HDL 35 (L) 12/03/2023   TRIG 236 (H) 12/03/2023   TRIG 229 (H) 05/14/2023   TRIG 145 01/15/2023   ALT 11 12/03/2023   ALT 12 05/14/2023   AST 15 12/03/2023   AST 19 05/14/2023      Chemistry      Component Value Date/Time   NA 134 12/03/2023 1410   K 4.4 12/03/2023 1410   CL 92 (L) 12/03/2023 1410   CO2 21 12/03/2023 1410   BUN 15 12/03/2023 1410   CREATININE 1.05 12/03/2023 1410   GLU 222 08/01/2014 0000      Component Value Date/Time   CALCIUM  9.4 12/03/2023 1410   ALKPHOS 91 12/03/2023 1410   AST 15 12/03/2023 1410   ALT 11 12/03/2023 1410   BILITOT 0.5 12/03/2023 1410       The ASCVD Risk score (Arnett DK, et al., 2019) failed to calculate for the following reasons:   The valid total cholesterol range is 130 to 320 mg/dL  Lab Results  Component Value Date   MICRALBCREAT <8 12/03/2023   MICRALBCREAT 11 08/05/2022   MICRALBCREAT n/a 03/31/2017    A/P: Diabetes currently uncontrolled with a most recent A1c of 11.2% on 03/13/24, which is down from 12.3% on 12/03/23. Patient is *** able to verbalize appropriate hypoglycemia management plan. Medication adherence appears ***. Control is suboptimal due to ***. -{Meds adjust:18428} basal insulin  {basal insulins:33573}  *** units daily.  -{Meds adjust:18428} rapid insulin  {bolus insulin :33574} ***.  -{Meds adjust:18428} GLP-1 {GLP1 options:33572} *** mg .  -{Meds adjust:18428} SGLT2-I {SGLT2i options:33571}*** mg daily.  -{Meds adjust:18428} metformin  ***.  -Patient educated on purpose, proper use, and potential adverse effects of ***.  -Extensively discussed pathophysiology of diabetes, recommended lifestyle interventions, dietary effects on blood sugar control.  -Counseled on s/sx of and management of hypoglycemia.  -Next A1c anticipated ***.   ASCVD risk - {primary/secondary:33575} prevention in  patient with diabetes. Last LDL is 52 mg/dL, at goal of <29 mg/dL. ASCVD risk factors include *** and 10-year ASCVD risk score of ***. {Desc; low/moderate/high:110033} intensity statin indicated.  -{Meds adjust:18428} {statin therapies:33576} *** mg daily.  -{Meds adjust:18428} ezetimibe 10 mg daily   Hypertension longstanding *** currently ***. Blood pressure goal of <130/80 *** mmHg. Medication adherence ***. Blood pressure control is suboptimal due to ***. -{Meds adjust:18428} *** mg ***.  {pharmacisttime:33368}  Follow-up:  Pharmacist on *** PCP clinic visit on ***  Peyton CHARLENA Ferries, PharmD, CPP Clinical Pharmacist [facility] [phone]

## 2024-05-24 ENCOUNTER — Encounter: Payer: Self-pay | Admitting: Family Medicine

## 2024-05-24 ENCOUNTER — Ambulatory Visit (INDEPENDENT_AMBULATORY_CARE_PROVIDER_SITE_OTHER): Admitting: Family Medicine

## 2024-05-24 VITALS — BP 119/78 | HR 78 | Wt 311.0 lb

## 2024-05-24 DIAGNOSIS — M47816 Spondylosis without myelopathy or radiculopathy, lumbar region: Secondary | ICD-10-CM | POA: Diagnosis not present

## 2024-05-24 DIAGNOSIS — I4819 Other persistent atrial fibrillation: Secondary | ICD-10-CM | POA: Diagnosis not present

## 2024-05-24 DIAGNOSIS — G629 Polyneuropathy, unspecified: Secondary | ICD-10-CM

## 2024-05-24 DIAGNOSIS — I5020 Unspecified systolic (congestive) heart failure: Secondary | ICD-10-CM | POA: Diagnosis not present

## 2024-05-24 DIAGNOSIS — I2510 Atherosclerotic heart disease of native coronary artery without angina pectoris: Secondary | ICD-10-CM | POA: Diagnosis not present

## 2024-05-24 DIAGNOSIS — I10 Essential (primary) hypertension: Secondary | ICD-10-CM | POA: Diagnosis not present

## 2024-05-24 DIAGNOSIS — Z7984 Long term (current) use of oral hypoglycemic drugs: Secondary | ICD-10-CM | POA: Diagnosis not present

## 2024-05-24 DIAGNOSIS — E7800 Pure hypercholesterolemia, unspecified: Secondary | ICD-10-CM

## 2024-05-24 DIAGNOSIS — E1140 Type 2 diabetes mellitus with diabetic neuropathy, unspecified: Secondary | ICD-10-CM | POA: Diagnosis not present

## 2024-05-24 MED ORDER — TIRZEPATIDE 2.5 MG/0.5ML ~~LOC~~ SOAJ: SUBCUTANEOUS | Status: DC

## 2024-05-24 NOTE — Progress Notes (Signed)
 Established patient visit   Patient: Taylor [name]   DOB: [birth year]   68 y.o. Male  MRN: [id number] Visit Date: 05/24/2024  Today's healthcare provider: Nancyann Perry, MD   Chief Complaint  Patient presents with   Medical Management of Chronic Issues    T2DM follow-up   Subjective    Discussed the use of AI scribe software for clinical note transcription with the patient, who gave verbal consent to proceed.  History of Present Illness   Taylor [name] Cutbirth is a 68 year old male with diabetes who presents for a follow-up regarding Mounjaro  treatment.  He is currently on Mounjaro  for diabetes management, taking injections twice a week, typically on Monday or Tuesday and then again on Friday or Saturday. He has not experienced any side effects from the medication and feels apprehensive about the injections, although they do not cause physical discomfort. His hemoglobin A1c was 11.2% a couple of months ago and is now 11%. He has been on this regimen for about a month.  He discusses his experience with pain management, noting that he previously saw a pain management specialist in [place], which he finds inconvenient due to the travel involved. He has not received any injections for pain management, only medications, and expresses frustration with the current situation as the local pain clinic is not accepting new patients for medication management only.  He is currently taking Eliquis  and Farxiga , among other medications, with his sister helping to organize his medication schedule. He takes his medications at 5:00 AM and 11:00 AM daily and reports no issues with his current medication regimen.     Lab Results  Component Value Date   HGBA1C 11.2 (A) 03/13/2024   HGBA1C 12.3 (H) 12/03/2023   HGBA1C 14.9 (H) 07/30/2023     Medications: Show/hide medication list[1]      Objective    BP 119/78 (BP Location: Left Arm, Patient Position: Sitting, Cuff Size: Large)    Pulse 78   Wt (!) 311 lb (141.1 kg)   SpO2 98%   BMI 42.18 kg/m   Physical Exam   General appearance: Obese male, cooperative and in no acute distress Head: Normocephalic, without obvious abnormality, atraumatic Respiratory: Respirations even and unlabored, normal respiratory rate Extremities: All extremities are intact.  Skin: Skin color, texture, turgor normal. No rashes seen  Psych: Appropriate mood and affect. Neurologic: Mental status: Alert, oriented to person, place, and time, thought content appropriate.    Assessment & Plan    1. Type 2 diabetes mellitus with diabetic neuropathy, unspecified whether long term insulin  use (HCC) (Primary) Tolerating twice weekly Mounjaro  2.5mg  sample injections. Cannot afford prescription and no patient assistance available. Continue pregabalin for diabetic neuropathy.   Increase to samples tirzepatide  (MOUNJARO ) 2.5 MG/0.5ML Pen; Inject 2.5mg  into the skin every Monday Wednesday and Friday  Dispense: 8 mL  Follow up in 6 weeks.   2. Coronary artery disease involving native coronary artery of native heart without angina pectoris Asymptomatic. Compliant with medication.  Continue aggressive risk factor modification.    3. HFrEF (heart failure with reduced ejection fraction) (HCC) Asymptomatic. Compliant with medication.  Continue aggressive risk factor modification.  Continue routine follow up cardiology   4. Primary hypertension Well controlled.  Continue current medications.    5. . Persistent atrial fibrillation (HCC) Rate controlled, on DOAC. Given 6 weeks samples Eliquis   7. Lumbar facet arthropathy (Bilateral) Followed at pain clinic in [place] and would like to establish at  local clinic due to difficulty travelling, but [facility] refused referral.   8. Hypercholesteremia He is tolerating rosuvastatin  well with no adverse effects.    Return in about 6 weeks (around 07/05/2024) for Diabetes.     Nancyann Perry, MD   [facility] [phone] (phone) [phone] (fax)  [facility]    [1]  Outpatient Medications Prior to Visit  Medication Sig   apixaban  (ELIQUIS ) 5 MG TABS tablet Take 1 tablet (5 mg total) by mouth 2 (two) times daily.   dapagliflozin  propanediol (FARXIGA ) 10 MG TABS tablet Take 10 mg by mouth daily.   glipiZIDE  (GLUCOTROL  XL) 10 MG 24 hr tablet TAKE ONE TABLET BY MOUTH EVERY DAY   losartan  (COZAAR ) 25 MG tablet Take 1 tablet (25 mg total) by mouth daily.   metFORMIN  (GLUCOPHAGE ) 1000 MG tablet TAKE ONE TABLET BY MOUTH TWICE DAILY   metoprolol  (TOPROL -XL) 200 MG 24 hr tablet TAKE ONE TABLET BY MOUTH ONCE DAILY   NARCAN 4 MG/0.1ML LIQD nasal spray kit Place 0.4 mg into the nose as needed (accidental overdose).   OXYCONTIN  15 MG 12 hr tablet Take 15 mg by mouth 3 (three) times daily.   pregabalin (LYRICA) 100 MG capsule Take 100 mg by mouth 3 (three) times daily.   rosuvastatin  (CRESTOR ) 20 MG tablet TAKE ONE TABLET BY MOUTH ONCE DAILY   torsemide  (DEMADEX ) 20 MG tablet TAKE TWO TABLETS BY MOUTH EVERY MORNING AND TAKE ONE TABLET EVERY EVENING   zolpidem  (AMBIEN  CR) 12.5 MG CR tablet Take 12.5 mg by mouth at bedtime.    [DISCONTINUED] tirzepatide  (MOUNJARO ) 2.5 MG/0.5ML Pen Inject 2.5 mg into the skin once a week. Inject 2.5mg  into the skin TWO TIMES EVERY WEEK.   No facility-administered medications prior to visit.

## 2024-05-24 NOTE — Patient Instructions (Addendum)
 Please review the attached list of medications and notify my office if there are any errors.   Increase Mounjaro  to give one injection every Monday, Wednesday, and Friday

## 2024-07-05 ENCOUNTER — Ambulatory Visit: Admitting: Family Medicine

## 2024-07-05 DIAGNOSIS — R160 Hepatomegaly, not elsewhere classified: Secondary | ICD-10-CM

## 2024-07-05 DIAGNOSIS — I440 Atrioventricular block, first degree: Secondary | ICD-10-CM

## 2024-07-05 DIAGNOSIS — I10 Essential (primary) hypertension: Secondary | ICD-10-CM

## 2024-07-05 DIAGNOSIS — E1140 Type 2 diabetes mellitus with diabetic neuropathy, unspecified: Secondary | ICD-10-CM

## 2024-07-05 DIAGNOSIS — I4819 Other persistent atrial fibrillation: Secondary | ICD-10-CM

## 2024-07-05 DIAGNOSIS — I959 Hypotension, unspecified: Secondary | ICD-10-CM

## 2024-07-05 DIAGNOSIS — Z1211 Encounter for screening for malignant neoplasm of colon: Secondary | ICD-10-CM

## 2024-07-05 DIAGNOSIS — E7800 Pure hypercholesterolemia, unspecified: Secondary | ICD-10-CM

## 2024-07-05 DIAGNOSIS — G4733 Obstructive sleep apnea (adult) (pediatric): Secondary | ICD-10-CM

## 2024-07-05 DIAGNOSIS — I5020 Unspecified systolic (congestive) heart failure: Secondary | ICD-10-CM

## 2024-07-05 DIAGNOSIS — E349 Endocrine disorder, unspecified: Secondary | ICD-10-CM

## 2024-07-05 DIAGNOSIS — F1194 Opioid use, unspecified with opioid-induced mood disorder: Secondary | ICD-10-CM

## 2024-07-05 DIAGNOSIS — G629 Polyneuropathy, unspecified: Secondary | ICD-10-CM

## 2024-07-14 ENCOUNTER — Encounter: Payer: Self-pay | Admitting: Family Medicine

## 2024-07-14 ENCOUNTER — Ambulatory Visit: Admitting: Family Medicine

## 2024-07-14 VITALS — BP 126/83 | HR 81 | Resp 16 | Ht 72.0 in | Wt 301.0 lb

## 2024-07-14 DIAGNOSIS — G4733 Obstructive sleep apnea (adult) (pediatric): Secondary | ICD-10-CM

## 2024-07-14 DIAGNOSIS — E7800 Pure hypercholesterolemia, unspecified: Secondary | ICD-10-CM

## 2024-07-14 DIAGNOSIS — G629 Polyneuropathy, unspecified: Secondary | ICD-10-CM

## 2024-07-14 DIAGNOSIS — R160 Hepatomegaly, not elsewhere classified: Secondary | ICD-10-CM

## 2024-07-14 DIAGNOSIS — I4819 Other persistent atrial fibrillation: Secondary | ICD-10-CM

## 2024-07-14 DIAGNOSIS — E1140 Type 2 diabetes mellitus with diabetic neuropathy, unspecified: Secondary | ICD-10-CM

## 2024-07-14 DIAGNOSIS — Z1211 Encounter for screening for malignant neoplasm of colon: Secondary | ICD-10-CM

## 2024-07-14 DIAGNOSIS — G4719 Other hypersomnia: Secondary | ICD-10-CM

## 2024-07-14 DIAGNOSIS — M792 Neuralgia and neuritis, unspecified: Secondary | ICD-10-CM

## 2024-07-14 DIAGNOSIS — F11982 Opioid use, unspecified with opioid-induced sleep disorder: Secondary | ICD-10-CM

## 2024-07-14 DIAGNOSIS — I5020 Unspecified systolic (congestive) heart failure: Secondary | ICD-10-CM

## 2024-07-14 DIAGNOSIS — F11981 Opioid use, unspecified with opioid-induced sexual dysfunction: Secondary | ICD-10-CM

## 2024-07-14 DIAGNOSIS — I10 Essential (primary) hypertension: Secondary | ICD-10-CM

## 2024-07-14 DIAGNOSIS — G894 Chronic pain syndrome: Secondary | ICD-10-CM

## 2024-07-14 DIAGNOSIS — E349 Endocrine disorder, unspecified: Secondary | ICD-10-CM

## 2024-07-14 DIAGNOSIS — D689 Coagulation defect, unspecified: Secondary | ICD-10-CM

## 2024-07-14 DIAGNOSIS — M47817 Spondylosis without myelopathy or radiculopathy, lumbosacral region: Secondary | ICD-10-CM

## 2024-07-14 DIAGNOSIS — F1194 Opioid use, unspecified with opioid-induced mood disorder: Secondary | ICD-10-CM

## 2024-07-14 DIAGNOSIS — G3184 Mild cognitive impairment, so stated: Secondary | ICD-10-CM

## 2024-07-14 DIAGNOSIS — I2510 Atherosclerotic heart disease of native coronary artery without angina pectoris: Secondary | ICD-10-CM

## 2024-07-14 DIAGNOSIS — Z8659 Personal history of other mental and behavioral disorders: Secondary | ICD-10-CM

## 2024-07-14 LAB — POCT GLYCOSYLATED HEMOGLOBIN (HGB A1C): Hemoglobin A1C: 11.3 % — AB (ref 4.0–5.6)

## 2024-07-14 MED ORDER — OZEMPIC (0.25 OR 0.5 MG/DOSE) 2 MG/3ML ~~LOC~~ SOPN: 0.5000 mg | PEN_INJECTOR | SUBCUTANEOUS | Status: DC

## 2024-07-14 MED ORDER — OZEMPIC (0.25 OR 0.5 MG/DOSE) 2 MG/3ML ~~LOC~~ SOPN: PEN_INJECTOR | SUBCUTANEOUS | Status: AC

## 2024-07-14 NOTE — Patient Instructions (Signed)
 Taylor [name]  Please review the attached list of medications and notify my office if there are any errors.   . Please bring all of your medications to every appointment so we can make sure that our medication list is the same as yours.

## 2024-07-14 NOTE — Progress Notes (Signed)
 "     Established patient visit   Patient: Taylor [name]   DOB: [birth year]   69 y.o. Male  MRN: [id number] Visit Date: 07/14/2024  Today's healthcare provider: Nancyann Perry, MD   Chief Complaint  Patient presents with   Follow-up    Med f/u    Subjective    Discussed the use of AI scribe software for clinical note transcription with the patient, who gave verbal consent to proceed.  History of Present Illness   Taylor [name] is a 69 year old male with diabetes who presents for medication management.  He is managing his diabetes with Mounjaro  injections, administered twice a week. He experiences discomfort with the injection process and prefers other methods of administration. He has noticed some weight loss since increasing the dose, which he attributes to the medication.  He has run out of Mounjaro  samples and last took a dose two days ago. He was previously on Ozempic  2.0mg  weekly last year which he tolerated well, but did not adequately control his blood sugar.  He is on other medications, which he takes regularly without significant issues, although he dislikes taking them. He mentions a childhood aversion to medicine due to frequent injections and temperature checks.  He is currently seeing a pain management specialist, in [place] for pain management.       Medications: Show/hide medication list[1] Review of Systems     Objective    BP 126/83 (BP Location: Left Arm, Patient Position: Sitting, Cuff Size: Large)   Pulse 81   Resp 16   Ht 6' (1.829 m)   Wt (!) 301 lb (136.5 kg)   SpO2 99%   BMI 40.82 kg/m   Physical Exam   General: Appearance:    Severely obese male in no acute distress  Eyes:    PERRL, conjunctiva/corneas clear, EOM's intact       Lungs:     Clear to auscultation bilaterally, respirations unlabored  Heart:    Normal heart rate. Irregularly irregular rhythm. No murmurs, rubs, or gallops.    MS:   All extremities are intact.     Neurologic:   Awake, alert, oriented x 3. No apparent focal neurological defect.        Results for orders placed or performed in visit on 07/14/24  POCT glycosylated hemoglobin (Hb A1C)  Result Value Ref Range   Hemoglobin A1C 11.3 (A) 4.0 - 5.6 %    Assessment & Plan    1. Type 2 diabetes mellitus with diabetic neuropathy, unspecified whether long term insulin  use (HCC) (Primary) Uncontrolled, currently on 2.5mg  Mounjaro  samples twice a week, but samples no longer available. Previously not at goal when 2mg  weekly Ozempic . He is very reluctant to take insulin  due to fear of needles, but advised current insulin  pens are similar to Mounjaro  pens.   For now will switch back to samples Ozempic  0.5mg  twice a week and attempt to get some patient assistance for Mounjaro . Advised that insulin  pens may be the only financially viable alternative.   2. Hypercholesteremia He is tolerating rosuvastatin  well with no adverse effects.    3 Chronic pain syndrome Continue current medications via pain management.   4. Persistent atrial fibrillation (HCC) Currently off of DOAC which is cost prohibitive. Pharmacy consult to try to get some patient assistance.   5. HFrEF (heart failure with reduced ejection fraction) (HCC) Well compensated. Continue current medications.    6. Morbid obesity (HCC) Would benefit from high dose  Return in about 2 months (around 09/11/2024).     Nancyann Perry, MD  [facility] [phone] (phone) [phone] (fax)  [facility]    [1]  Outpatient Medications Prior to Visit  Medication Sig   apixaban  (ELIQUIS ) 5 MG TABS tablet Take 1 tablet (5 mg total) by mouth 2 (two) times daily.   dapagliflozin  propanediol (FARXIGA ) 10 MG TABS tablet Take 10 mg by mouth daily.   glipiZIDE  (GLUCOTROL  XL) 10 MG 24 hr tablet TAKE ONE TABLET BY MOUTH EVERY DAY   losartan  (COZAAR ) 25 MG tablet Take 1 tablet (25 mg total) by mouth daily.    metFORMIN  (GLUCOPHAGE ) 1000 MG tablet TAKE ONE TABLET BY MOUTH TWICE DAILY   metoprolol  (TOPROL -XL) 200 MG 24 hr tablet TAKE ONE TABLET BY MOUTH ONCE DAILY   NARCAN 4 MG/0.1ML LIQD nasal spray kit Place 0.4 mg into the nose as needed (accidental overdose).   OXYCONTIN  15 MG 12 hr tablet Take 15 mg by mouth 3 (three) times daily.   pregabalin (LYRICA) 100 MG capsule Take 100 mg by mouth 3 (three) times daily.   rosuvastatin  (CRESTOR ) 20 MG tablet TAKE ONE TABLET BY MOUTH ONCE DAILY   tirzepatide  (MOUNJARO ) 2.5 MG/0.5ML Pen Inject 2.5mg  into the skin every Monday Wednesday and Friday   torsemide  (DEMADEX ) 20 MG tablet TAKE TWO TABLETS BY MOUTH EVERY MORNING AND TAKE ONE TABLET EVERY EVENING   zolpidem  (AMBIEN  CR) 12.5 MG CR tablet Take 12.5 mg by mouth at bedtime.    No facility-administered medications prior to visit.   "

## 2024-09-11 ENCOUNTER — Ambulatory Visit: Admitting: Family Medicine
